# Patient Record
Sex: Female | Born: 1935 | Race: Black or African American | Hispanic: No | Marital: Married | Smoking: Never smoker
Health system: Southern US, Community
[De-identification: ages and names within clinical notes are randomized; demographics above are authoritative.]

## PROBLEM LIST (undated history)

## (undated) DIAGNOSIS — T8859XA Other complications of anesthesia, initial encounter: Secondary | ICD-10-CM

## (undated) DIAGNOSIS — O2230 Deep phlebothrombosis in pregnancy, unspecified trimester: Secondary | ICD-10-CM

## (undated) DIAGNOSIS — F419 Anxiety disorder, unspecified: Secondary | ICD-10-CM

## (undated) DIAGNOSIS — M109 Gout, unspecified: Secondary | ICD-10-CM

## (undated) DIAGNOSIS — K5909 Other constipation: Secondary | ICD-10-CM

## (undated) DIAGNOSIS — Z87442 Personal history of urinary calculi: Secondary | ICD-10-CM

## (undated) DIAGNOSIS — I219 Acute myocardial infarction, unspecified: Secondary | ICD-10-CM

## (undated) DIAGNOSIS — G2581 Restless legs syndrome: Secondary | ICD-10-CM

## (undated) DIAGNOSIS — R1310 Dysphagia, unspecified: Secondary | ICD-10-CM

## (undated) DIAGNOSIS — N183 Chronic kidney disease, stage 3 unspecified: Secondary | ICD-10-CM

## (undated) DIAGNOSIS — Z9889 Other specified postprocedural states: Secondary | ICD-10-CM

## (undated) DIAGNOSIS — T4145XA Adverse effect of unspecified anesthetic, initial encounter: Secondary | ICD-10-CM

## (undated) DIAGNOSIS — R42 Dizziness and giddiness: Secondary | ICD-10-CM

## (undated) DIAGNOSIS — M1990 Unspecified osteoarthritis, unspecified site: Secondary | ICD-10-CM

## (undated) DIAGNOSIS — R112 Nausea with vomiting, unspecified: Secondary | ICD-10-CM

## (undated) DIAGNOSIS — R569 Unspecified convulsions: Secondary | ICD-10-CM

## (undated) DIAGNOSIS — I723 Aneurysm of iliac artery: Secondary | ICD-10-CM

## (undated) DIAGNOSIS — E119 Type 2 diabetes mellitus without complications: Secondary | ICD-10-CM

## (undated) DIAGNOSIS — C189 Malignant neoplasm of colon, unspecified: Secondary | ICD-10-CM

## (undated) DIAGNOSIS — H409 Unspecified glaucoma: Secondary | ICD-10-CM

## (undated) DIAGNOSIS — Z8719 Personal history of other diseases of the digestive system: Secondary | ICD-10-CM

## (undated) DIAGNOSIS — K219 Gastro-esophageal reflux disease without esophagitis: Secondary | ICD-10-CM

## (undated) HISTORY — DX: Chronic kidney disease, stage 3 unspecified: N18.30

## (undated) HISTORY — DX: Aneurysm of iliac artery: I72.3

## (undated) HISTORY — DX: Deep phlebothrombosis in pregnancy, unspecified trimester: O22.30

## (undated) HISTORY — PX: VEIN SURGERY: SHX48

## (undated) HISTORY — PX: TOTAL HIP ARTHROPLASTY: SHX124

## (undated) HISTORY — DX: Unspecified glaucoma: H40.9

## (undated) HISTORY — DX: Malignant neoplasm of colon, unspecified: C18.9

## (undated) HISTORY — PX: TUBAL LIGATION: SHX77

## (undated) HISTORY — PX: CHOLECYSTECTOMY: SHX55

## (undated) HISTORY — DX: Acute myocardial infarction, unspecified: I21.9

---

## 1991-05-21 DIAGNOSIS — I219 Acute myocardial infarction, unspecified: Secondary | ICD-10-CM

## 1991-05-21 HISTORY — DX: Acute myocardial infarction, unspecified: I21.9

## 2001-05-20 HISTORY — PX: COLECTOMY: SHX59

## 2002-11-04 ENCOUNTER — Inpatient Hospital Stay (HOSPITAL_COMMUNITY): Admission: EM | Admit: 2002-11-04 | Discharge: 2002-11-09 | Payer: Self-pay | Admitting: Internal Medicine

## 2002-11-06 ENCOUNTER — Encounter: Payer: Self-pay | Admitting: Internal Medicine

## 2003-06-02 ENCOUNTER — Ambulatory Visit (HOSPITAL_COMMUNITY): Admission: RE | Admit: 2003-06-02 | Discharge: 2003-06-02 | Payer: Self-pay | Admitting: Internal Medicine

## 2005-01-09 ENCOUNTER — Ambulatory Visit: Payer: Self-pay | Admitting: Cardiology

## 2005-01-16 ENCOUNTER — Ambulatory Visit: Payer: Self-pay | Admitting: Cardiology

## 2005-09-03 ENCOUNTER — Ambulatory Visit: Payer: Self-pay | Admitting: Cardiology

## 2005-10-02 ENCOUNTER — Ambulatory Visit: Payer: Self-pay | Admitting: Internal Medicine

## 2005-10-02 ENCOUNTER — Inpatient Hospital Stay (HOSPITAL_COMMUNITY): Admission: AD | Admit: 2005-10-02 | Discharge: 2005-10-08 | Payer: Self-pay | Admitting: Internal Medicine

## 2005-10-15 ENCOUNTER — Observation Stay (HOSPITAL_COMMUNITY): Admission: EM | Admit: 2005-10-15 | Discharge: 2005-10-18 | Payer: Self-pay | Admitting: Emergency Medicine

## 2005-10-23 ENCOUNTER — Ambulatory Visit: Payer: Self-pay | Admitting: Internal Medicine

## 2005-11-25 ENCOUNTER — Ambulatory Visit: Payer: Self-pay | Admitting: Internal Medicine

## 2005-11-28 ENCOUNTER — Ambulatory Visit: Payer: Self-pay | Admitting: Internal Medicine

## 2005-11-28 ENCOUNTER — Ambulatory Visit (HOSPITAL_COMMUNITY): Admission: RE | Admit: 2005-11-28 | Discharge: 2005-11-28 | Payer: Self-pay | Admitting: Internal Medicine

## 2005-12-18 ENCOUNTER — Ambulatory Visit: Payer: Self-pay | Admitting: Internal Medicine

## 2006-03-04 ENCOUNTER — Ambulatory Visit: Payer: Self-pay | Admitting: Internal Medicine

## 2006-04-07 ENCOUNTER — Ambulatory Visit (HOSPITAL_COMMUNITY): Admission: RE | Admit: 2006-04-07 | Discharge: 2006-04-07 | Payer: Self-pay | Admitting: Internal Medicine

## 2006-07-21 ENCOUNTER — Ambulatory Visit: Payer: Self-pay | Admitting: Cardiology

## 2007-03-01 ENCOUNTER — Emergency Department (HOSPITAL_COMMUNITY): Admission: EM | Admit: 2007-03-01 | Discharge: 2007-03-01 | Payer: Self-pay | Admitting: Emergency Medicine

## 2007-04-10 ENCOUNTER — Ambulatory Visit: Payer: Self-pay | Admitting: Cardiology

## 2010-10-05 NOTE — Op Note (Signed)
NAME:  Traci Clark, Traci Clark                           ACCOUNT NO.:  [id number]   MEDICAL RECORD NO.:  [id number]                   PATIENT TYPE:  AMB   LOCATION:  DAY                                  FACILITY:  [facility]   PHYSICIAN:  R. Roetta Sessions, M.D.              DATE OF BIRTH:  [birth year]   DATE OF PROCEDURE:  06/02/2003  DATE OF DISCHARGE:                                 OPERATIVE REPORT   PROCEDURE:  Esophagogastroduodenoscopy with Elease Hashimoto dilation.   INDICATIONS FOR PROCEDURE:  The patient is a 77 year old lady with what she  describes as esophageal dysphagia to pills and solids, pointing to her  suprasternal notch area.  Her hemoglobin returned within normal limits at  12.8.  EGD is now being done to further evaluate her dysphagia symptoms.  This approach has been discussed with the patient at length.  The potential  risks, benefits, and alternatives have been reviewed.  Please see my  05/25/2003 consultation note.   PROCEDURE:  O2 saturation, blood pressure, pulses, and respirations were  monitored throughout the entire procedure.  Conscious sedation was with  Versed 3 mg IV, Demerol 50 mg IV in divided doses.  The instrument used was  the Olympus video chip adult gastroscope.   FINDINGS:  Examination of the tubular esophagus revealed no mucosal  abnormalities.  The EG junction was easily traversed.   Stomach:  The gastric cavity was empty and insufflated well with air.  Thorough examination of the gastric mucosa including retroflex view in the  proximal stomach and esophagogastric junction demonstrated no abnormalities.  The pylorus was patent and easily traversed.   Duodenum:  Examination of the bulb and second portion revealed no  abnormalities.   THERAPEUTIC/DIAGNOSTIC MANEUVERS PERFORMED:  A 56 French Maloney dilator was  passed to full insertion with ease.  A look back revealed a slight rent in  the mucosa through the UES.  Otherwise, no apparent complication related to  passage of the dilator.  The patient tolerated the procedure well and was  reactive in endoscopy.   IMPRESSION:  1. Normal mucosa of the esophagus.  No obvious ring or stricture, although     she may have had an occult upper esophageal sphincter web, status post     passage of a 56 [place] Maloney dilator, as described above.  2. Normal stomach, normal first and second portions of the duodenum.   RECOMMENDATIONS:  1. Continue Prilosec 20 mg orally daily.  2. Will ask this nice lady to come back to see [place] in one month to see how     she is doing.  If her dysphagia persists, she will need further     evaluation.      ___________________________________________  Jonathon Bellows, M.D.   RMR/MEDQ  D:  06/02/2003  T:  06/02/2003  Job:  [id number]   cc:   Selinda Flavin  [address]  [place]  [place] [address]  Fax: [phone]

## 2010-10-05 NOTE — Discharge Summary (Signed)
NAME:  Traci Clark, Traci Clark                          ACCOUNT NO.:  [id number]   MEDICAL RECORD NO.:  [id number]                   PATIENT TYPE:  INP   LOCATION:  4707                                 FACILITY:  [facility]   PHYSICIAN:  Maple Mirza, P.A.              DATE OF BIRTH:  [birth year]   DATE OF ADMISSION:  DATE OF DISCHARGE:  11/09/2002                           DISCHARGE SUMMARY - REFERRING   DISCHARGE DIAGNOSES:  1. Chest pain, known coronary artery disease, exertional.  Concurrent     diaphoresis, nausea and vomiting.  Finding of non-obstructive coronary     artery disease by left-heart catheterization June 18.  2. Abdominal pain, right epigastric to right upper quadrant, persisting,     etiology unclear.     A. Increased pancreatic enzymes on admission, now normal.     B. Status post remote cholecystectomy.  3. CT study, question of liver lesions, but MRI shows cysts there.  4. MRCP ducts are normal.  No mention of pancreas divisum.   SECONDARY DIAGNOSES:  1. Dyslipidemia.  2. History of deep venous thrombosis.  3. History of myocardial infarction in 1998, underwent catheterization, no     PCI.  4. Gastroesophageal reflux disease.  5. Osteoarthritis.  6. Cerebrovascular accident x3.  7. History of colon cancer, status post partial colectomy in September of     2003.  8. Status post cholecystectomy, bilateral tubal ligation.   PROCEDURES:  1. Left-heart catheterization, left ventriculogram and coronary angiography,     Dr. Rollene Rotunda, November 05, 2002.  The study shows the left main was     free of disease.  The left anterior descending coronary artery had a     proximal 30% stenosis, a mid point 30% stenosis and had a small second     diagonal.  The first diagonal was large and ran in the distribution of     the ramus intermediate.  It has an ostial 25% stenosis.  The third     diagonal was small and had an ostial 30% stenosis.  The circumflex is a     large  branching artery with a mid obtuse marginal and luminal     irregularities.  The right coronary artery was a dominant vessel with     diffuse luminal irregularities.  The left ventriculogram shows the     ejection fraction 65% with normal wall motion.  Conclusion was     nonobstructive coronary artery plaque.  2. In the setting of continued abdominal pain, computed tomogram of the     abdomen and pelvis.  The study showed there are a few small hepatic     lesions too small to accurately characterize.  Given the patient's     history of colon cancer, metastatic disease a possibility.  The spleen is     normal size.  The pancreas demonstrates no significant findings.  Common  bile duct measures 8 mm in the region of the pancreas which is probably     normal given the fact the patient has had a cholecystectomy and the     patient's age.  The adrenal gland is normal.  Two lesions in the right     kidney are probably hemorrhagic.  There are two cysts in the left kidney     also.  They are simple-appearing cysts.  Ultrasound might be helpful to     demonstrate these lesions are benign.  No mesenteric or retroperitoneal     masses or adenopathy.  No colonic wall thickening or colonic lesions.     The patient may have had a partial right colectomy.  No significant bony     lesions.  A CT of the pelvis shows multiple phlebolithic calcifications     in the ovarian veins.  The uterus and ovaries are unremarkable.  No     pelvic adenopathy, pelvic masses or free pelvic fluid.  3. MRI of the abdomen.  The actual report for the MRI is unavailable, but     chart recording notes that the liver lesions are cystic.  Also done was a     MRCP showing that the ducts are normal without mention of pancreas     divisum.   DISCHARGE DISPOSITION:  The patient is ready for discharge on June 22.  She  still has vague abdominal pain in the right upper quadrant, right epigastric  area.  It does not come or go.  It  is not linked to eating at all, and not  linked to exertion.  It is not positional.  It does not change when she  moves.  It is poorly relieved with oxycodone.  She does not have any  concomitant nausea or vomiting.  She does have some constipation.  This is  being addressed at the time of discharge.   DISCHARGE MEDICATIONS:  1. Lotrimin cream applied twice a day topically.  2. Protonix 40 mg twice daily.  3. Antivert 20 mg daily.  4. Enteric-coated baby aspirin 81 mg daily.  5. Multivitamin daily.  6. Ativan 0.25 mg two to three times daily as needed.  7. Ultram 13 mg one to two tablets every six hours as needed for pain.   DISCHARGE ACTIVITIES:  She may walk daily to keep up her strength.   DISCHARGE DIAGNOSES:  1. Low sodium, low cholesterol diet.  2. If she has any problems at her catheterization site in the right groin,     she is to call [phone].  These would include problems with swelling,     increased pain or drainage.  3. She will have a followup in [facility] office on Thursday, July 1, at     1:15 in the afternoon.  4. She is asked to call Dr. Dimas Aguas to arrange appointment for followup.   BRIEF HISTORY:  The patient is a 77 year old female with a history of  myocardial infarction in 1989.  She began having substernal chest pain,  heaviness, decrease in activity.  She took TUM's and aspirin without relief.  She did not use nitroglycerin.  This pain has continued for six days.  She  does have diaphoresis, nausea and vomiting, but she is not short of breath.  She went to the emergency room since her symptoms were not improving.  They  were 6/10 at the worst.  Sublingual nitroglycerin in the emergency room  helped.  She was pain-free,  but she still had nausea.  Sometimes she has  these symptoms secondary to increased exertion.  They come in increasing  frequency and intensity recently.  She had a recent history of fatigue.  She  gets chest pain when vacuuming.   The  patient was admitted to [facility] through the emergency room.  Cardiac enzymes were negative for myocardial infarction.  However, she did  have a left heart catheterization performed June 18 which showed  nonobstructive coronary artery plaques and an ejection fraction of 65%.  A  GI consultation was obtained due to increased amylase and lipase on  admission.  She was seen by GI service for possible pancreatitis in a  setting of elevated lipase and amylase.  She underwent CT scan of the  abdomen and pelvis because of history of colon cancer and possibility of  metastatic involvement in the pancreas, bowel or liver.  A CT of the abdomen  and pelvis were as described above. A followup MRI was obtained,  and it  showed the liver lesions were cysts, and the renal lesions were most  certainly also cystic.  She also had an MRCP to image the common bile duct  which showed that they were normal.  At the time of discharge, her chest  pain has resolved.  However, she still has vague abdominal pain at the right  upper quadrant and right epigastric area.  She goes home with the  medications and with followup as suggested.                                               Maple Mirza, P.A.    GM/MEDQ  D:  11/09/2002  T:  11/09/2002  Job:  [id number]   cc:   Dimas Aguas, Dr.  Alben Spittle, Dr.  Roderic Scarce, [place]   [facility]  Roderic Scarce, N.D.    cc:   Dimas Aguas, Dr.  Alben Spittle, Dr.  Roderic Scarce, [facility].   [facility]  [place], [place].D.

## 2010-10-05 NOTE — Op Note (Signed)
NAMEGENEVIVE, PRINTUP                ACCOUNT NO.:  [id number]   MEDICAL RECORD NO.:  [id number]          PATIENT TYPE:  INP   LOCATION:  A326                          FACILITY:  [facility]   PHYSICIAN:  Lionel December, M.D.    DATE OF BIRTH:  31-Jul-1930   DATE OF PROCEDURE:  10/03/2005  DATE OF DISCHARGE:                                 OPERATIVE REPORT   PROCEDURE:  Esophagogastroduodenoscopy.   ENDOSCOPIST:  Lionel December, M.D.   INDICATIONS:  Traci Clark is a 77 year old African American female who has over  a 3-week history of generalized abdominal pain associated with nausea and  vomiting.  She had essentially negative abdominopelvic CT.  This morning she  had CT angiogram of the abdomen which was unremarkable.  She also has mildly  elevated transaminases, which were initially noted yesterday, but there were  normal on admission, which was at [facility] on the 13th of this month.  This  afternoon, she is undergoing diagnostic EGD to make sure she does not have  peptic ulcer disease or pyloric stenosis that might explain some or all of  all of her symptoms.  Procedure risks were reviewed the patient; informed  consent was obtained.   MEDICATIONS FOR CONSCIOUS SEDATION:  Benzocaine spray for oropharyngeal  topical anesthesia, Demerol 25 mg IV, Versed 3 mg IV.   FINDINGS:  Procedure performed in endoscopy suite.  The patient's vital  signs and O2 SATs were monitored during the procedure and remained stable.  The patient was placed in the left lateral decubitus position and Olympus  videoscope was passed via oropharynx without any difficulty into esophagus.   ESOPHAGUS:  Mucosa of the esophagus normal.  GE junction was unremarkable,  located at 37 cm from the incisors.   STOMACH:  It was empty and distended very well with insufflation.  Folds in  the proximal stomach were normal.  Examination of the mucosa at body,  antrum, pyloric channel as well as angularis, fundus and cardia was normal.   DUODENUM:  Bulbar mucosa was normal.  Scope was passed into the second and  third part of the duodenum, where mucosa and folds were normal.  Endoscope  was withdrawn.  The patient tolerated the procedure well.   FINAL DIAGNOSIS:  Normal esophagogastroduodenoscopy.   Etiology for her symptom complex remains unclear.   PLAN:  1.  Trial of dicyclomine and low-dose lorazepam 3 times a day.  2.  MRCP in a.m., since her transaminases are still elevated.  3.  I will also consider empiric antidepressant therapy.  If MR is normal,      we may change our plans and also do a      colonoscopy, which will be due in October or September of this year.  4.  While studies are negative, a trial with antidepressant would be in      order.  5.  Finally, I would also check a fasting cortisol level.      Lionel December, M.D.  Electronically Signed     NR/MEDQ  D:  10/03/2005  T:  10/04/2005  Job:  [id number]  cc:   Rory Percy  Fax: [phone]

## 2010-10-05 NOTE — H&P (Signed)
Traci Clark, Traci Clark                ACCOUNT NO.:  [id number]   MEDICAL RECORD NO.:  [id number]          PATIENT TYPE:  INP   LOCATION:  A311                          FACILITY:  [facility]   PHYSICIAN:  Lonia Blood, M.D.       DATE OF BIRTH:  [birth year]   DATE OF ADMISSION:  10/15/2005  DATE OF DISCHARGE:  LH                                HISTORY & PHYSICAL   PRIMARY CARE PHYSICIAN:  Selinda Flavin, M.D. in [place], [place].   CHIEF COMPLAINT:  Nausea, vomiting, abdominal pain and cannot keep anything  down.   HISTORY OF PRESENT ILLNESS:  Traci Clark is a 77 year old African-American  woman who was admitted from Oct 03, 2005 until Oct 08, 2005 at [facility] with complaints of nausea, vomiting and abdominal pain.  She has an  extensive evaluation including CT angiogram of the abdomen, MRCP, EGD and  upper gastrointestinal series with small bowel follow through, which were  all negative.  The patient was discharged home with a presumed diagnosis of  gastroparesis, and she was instructed to advance her diet as tolerated.  Traci Clark reports that she has lost completely her appetite, and that she  was just not able to eat anything at all at home, and every time she would  try to eat food or drink water, she would vomit promptly and started having  abdominal pain.  For this reason, she presented back to the emergency room  today to be evaluated and admitted to the hospital.   PAST MEDICAL HISTORY:  1.  Hypertension.  2.  Gastroesophageal reflux disease with multiple esophageal dilatations.  3.  Nonobstructive coronary artery disease.  4.  Colon cancer, status post partial resection of her colon in 2003.  5.  Anxiety.  6.  Status post cholecystectomy.  7.  Status post bilateral tubal ligation.  8.  History of biliary pancreatitis in 1998.   MEDICATIONS AT HOME:  1.  Nortriptyline 10 mg at bedtime.  2.  Ultracet 50 mg daily.  3.  Meclizine 25 mg as needed for dizziness.  4.   Sertraline 25 mg daily.  5.  Diazepam 2.5 mg daily.  6.  Protonix 40 mg daily.  7.  Hydrochlorothiazide 25 mg daily.  8.  Reglan 10 mg before each meal.  9.  Multivitamin daily.   ALLERGIES:  1.  DARVOCET.  2.  VICODIN.   SOCIAL HISTORY:  The patient is retired.  She lives in [place], [place] her husband.  She is married.  She has 7 children; 5 of them are  diabetic.  She has never smoked cigarettes, and does not drink alcohol.   FAMILY HISTORY:  Both parents are deceased.  The patient has 7 siblings; 2  of them are living.  She has a positive family history of colon cancer, and  head and neck cancer.   REVIEW OF SYSTEMS:  On admission, the patient denies chest pain, denies  dyspnea, denies dysuria.  She reports decreased urinary output.  All other  systems are negative.   PHYSICAL EXAMINATION:  VITAL SIGNS:  On admission, temperature of 98.0,  blood pressure of 141/70, pulse 73, respirations 20, saturation of 98% on  room air.  GENERAL APPEARANCE:  The patient appears in no apparent distress.  She is  lying on a stretcher.  She seems to be worried out her outcome.  She is  oriented to place, person and time, and able to provide a history.  HEENT:  Eyes - pupils equal, round and reactive to light and accommodation.  Extraocular movement is intact.  Sclerae are anicteric.  Conjunctivae are  pink.  Throat is clear.  Mouth is without ulcerations.  NECK:  Supple without JVD.  No carotid bruits.  CHEST:  Clear to auscultation bilaterally without wheezes, rhonchi or  crackles.  HEART:  Regular rate and rhythm without murmurs, rubs, or gallops.  ABDOMEN:  Obese and soft.  There is some mild diffuse tenderness to deep  palpation.  There is no rebound, no guarding and no palpable masses.  Bowel  sounds are decreased.  EXTREMITIES:  No edema.  SKIN:  Warm and dry without any suspicious-looking rashes.  NEUROLOGIC:  Cranial nerves III-XII are intact.  Strength is 5/5 in all  4  extremities.  Sensation is intact.   LABORATORY DATA:  At the time of admission, white blood cell count of 4.6,  hemoglobin 13.5, platelet count 259.  Sodium 140, potassium 3.8, chloride  104, bicarbonate 30, BUN 8, creatinine 1.4, glucose 112.  Calcium is 10,  albumin 3.7, AST is 27, ALT 19, alkaline phosphatase 111.  Total bilirubin  of 0.8, and lipase is 34.  A flat plate abdominal x-ray is within normal  limits.   ASSESSMENT AND PLAN:  1.  Nausea, vomiting and abdominal pain.  In reviewing this patient's      history and physical findings and previous records, it does appear that      this patient has a functional abdominal problem.  At this point in time,      I will also rule out celiac disease, rule out porphyria, and rule out      heavy metal poisoning.  We will treat this patient symptomatically with      intravenous fluids, Reglan, Zofran and analgesia.  Will try to advance      her diet as she tolerates.  We will also keep this patient on a proton      pump inhibitor.  2.  Anxiety disorder.  We will try to use Xanax around the clock, which also      has a component of nausea suppression.  3.  During the previous hospitalization, the patient was found to have a      relative low cortisol level, so I will      empirically treat this patient with steroids intravenous to see if this      will help her symptoms.  4.  Dehydration.  The patient will be started on intravenous fluids, and her      volume status will be closely monitored.      Lonia Blood, M.D.  Electronically Signed     SL/MEDQ  D:  10/15/2005  T:  10/15/2005  Job:  [id number]   cc:   Selinda Flavin  Fax: [phone]   Lionel December, M.D.  [address]  [place]  [place] [address]

## 2010-10-05 NOTE — Discharge Summary (Signed)
   NAME:  Traci Clark, Traci Clark                          ACCOUNT NO.:  [id number]   MEDICAL RECORD NO.:  [id number]                   PATIENT TYPE:  INP   LOCATION:  4707                                 FACILITY:  [facility]   PHYSICIAN:  Pricilla Riffle, M.D.                 DATE OF BIRTH:  1930-06-24   DATE OF ADMISSION:  11/04/2002  DATE OF DISCHARGE:  11/09/2002                           DISCHARGE SUMMARY - REFERRING   LABORATORY DATA:  On admission, November 04, 2002, amylase was 328 and lipase  342.  By November 06, 2002, amylase was 166 and lipase 20 and by November 07, 2002,  amylase was 102 and lipase 21.  Admission cardiac enzymes, November 04, 2002,  were troponin less than 0.01, CK 64, CK-MB 0.5.  CBC on November 07, 2002 showed  hemoglobin 11.9, hematocrit 35.9, platelets 174,000, white cells 4.6.  Serum  electrolytes on November 06, 2002 showed sodium 140, potassium 3.6, chloride  107, carbonate 29, BUN 7, creatinine 1.0, and glucose 90.  Lipid profile  shows that her cholesterol on November 05, 2002 was 181, triglycerides 86, HDL  cholesterol 43, LDL cholesterol 121.     Maple Mirza, P.A.                    Pricilla Riffle, M.D.    GM/MEDQ  D:  11/09/2002  T:  11/09/2002  Job:  [id number]

## 2010-10-05 NOTE — Cardiovascular Report (Signed)
   NAME:  Traci Clark, Traci Clark                          ACCOUNT NO.:  [id number]   MEDICAL RECORD NO.:  [id number]                   PATIENT TYPE:  INP   LOCATION:  4707                                 FACILITY:  [facility]   PHYSICIAN:  Rollene Rotunda, M.D.                DATE OF BIRTH:  December 13, 1930   DATE OF PROCEDURE:  11/05/2002  DATE OF DISCHARGE:                              CARDIAC CATHETERIZATION   PRIMARY CARE PHYSICIAN:  Selinda Flavin, M.D.   PROCEDURES PERFORMED:  1. Left heart catheterization.  2. Coronary arteriography.   CARDIOLOGIST:  Rollene Rotunda, M.D.   INDICATIONS:  Evaluate patient with chest pain suggestive of unstable  angina.   PROCEDURAL NOTE:  Left heart catheterization was performed via the right  femoral artery.  The artery was cannulated using an anterior wall puncture.  Number six French arterial sheath was inserted via the modified Seldinger  technique.  Preformed Judkins and pigtail catheter were utilized.   The patient tolerated the procedure well and left the lab in stable  condition.   RESULTS:   HEMODYNAMIC DATA:  LV 177/13.  AO 177/80.   CORONARIES:  The left main was normal.   The LAD was a large wrap around vessel.  There was proximal 30% stenosis and  mid 30% stenosis at a small second diagonal.  The first diagonal was very  large and ran in the distribution of a ramus intermediate vessel.  It had  ostial 25% stenosis.  The second diagonal was small.  A third diagonal was  small and had ostial 30% stenosis.   The circumflex was essentially a very large branching mid obtuse marginal  with luminal irregularities.   The right coronary artery was a dominant vessel with diffuse luminal  irregularities.   LEFT VENTRICULOGRAM:  The left ventriculogram was obtained in the RAO  projection.  The EF was 65% with normal wall motion.   CONCLUSION:  Nonobstructive coronary artery plaque.    PLAN:  1. The patient will have primary risk reduction.  2.  No further cardiovascular testing is suggested.                                                 Rollene Rotunda, M.D.    JH/MEDQ  D:  11/05/2002  T:  11/07/2002  Job:  [id number]  Selinda Flavin  [address]  [place]  [place] [address]  Fax: [phone]   cc:   Selinda Flavin  [address]  [place]  [place] [address]  Fax: [phone]

## 2010-10-05 NOTE — Consult Note (Signed)
Traci Clark, Traci Clark                            ACCOUNT NO.:  [id number]   MEDICAL RECORD NO.:  [id number]                  PATIENT TYPE:   LOCATION:                                       FACILITY:   PHYSICIAN:  R. Roetta Sessions, M.D.              DATE OF BIRTH:  10-12-1930   DATE OF CONSULTATION:  05/25/2003  DATE OF DISCHARGE:                                   CONSULTATION   REQUESTING PHYSICIAN:  Dr. Dimas Aguas.   CHIEF COMPLAINT:  Solid food dysphagia.   HISTORY OF PRESENT ILLNESS:  Traci Clark is a 77 year old African-  American female who presents to our office with recent history of solid food  dysphagia.  She states that she is having difficulty swallowing her pills.  Once she does get them down, several seconds later she is having  regurgitation.  She denies any dysphagia with liquids.  She is also  complaining of odynophagia and notes pain in her retrosternal area.  She  denies any nausea or vomiting.  She does have history of chronic GERD and  takes Prilosec 20 mg daily.  She feels her heartburn and indigestion are  well controlled on the Prilosec.  She has been on PPI therapy for  approximately 10 years now.  She did have episode of hematemesis when she  was recently hospitalized in December 2004 for tonsillitis and pharyngitis;  however, she has not had any recently.  She denies any lower abdominal pain.  She denies any melena but she does report occasional dark stools.  She does  have a history of colon carcinoma and is status post resection in September  2003 by Dr. Cleotis Nipper.  She is being followed with colonoscopies by Dr.  Verne Grain office.  The last colonoscopy was September 2004 which  reportedly per the patient was normal.   PAST MEDICAL HISTORY:  1. Vertigo.  2. GERD.  3. Hypertension.  4. Arthritis.  5. Anxiety.  6. DVT.  7. Recent hospitalization December 2004 for tonsillitis and pharyngitis.  8. Last colonoscopy September 2004 by Dr. Cleotis Nipper  reportedly normal per     the patient.  9. Hiatal hernia.   PAST SURGICAL HISTORY:  1. Colon carcinoma status post resection September 2003.  Is being followed     by Dr. Verne Grain office.  2. Cholecystectomy 1998 secondary to cholelithiasis.  3. Tubal ligation.   CURRENT MEDICATIONS:  1. Meclizine 25 mg daily.  2. Prilosec 20 mg daily.  3. Multivitamin daily.  4. Atenolol 50 mg one-half tablet q.h.s.  5. Calcium 600 mg b.i.d.  6. Aspirin 81 mg daily.  7. Tylenol ES q.i.d.  8. Xanax 0.25 mg t.i.d.   ALLERGIES:  DARVOCET and HYDROCODONE.   FAMILY HISTORY:  No known family history of colorectal carcinoma.  Mother is  deceased secondary to tuberculosis.  Father had history of heart disease.  She has two deceased sisters as well  as three deceased brothers and one  brother alive in fair health.   SOCIAL HISTORY:  Traci Clark has been married for 12 years.  She has seven  children in relatively good health.  She is currently retired.  She denies  any tobacco, alcohol, or drug use.   REVIEW OF SYSTEMS:  CONSTITUTIONAL:  Weigh is stable although she does  report she may have lost a few pounds over the last couple of months  secondary to decreased appetite.  She denies any fever or chills.  She is  complaining of fatigue.  EXTREMITIES:  Complains of right axillary swelling  and pain.  Denies any lower extremity edema.  SKIN:  Denies any rash or  jaundice.  GI:  See HPI.   PHYSICAL EXAMINATION:  VITAL SIGNS:  Weight 181.25 pounds, height 66 inches.  Temperature 97.5, blood pressure 140/80, pulse 64.  GENERAL:  Traci Clark is a 77 year old African-American female who is  well-developed, well-nourished, and in no apparent distress today.  HEENT:  Sclerae clear, nonicteric; conjunctivae pink.  Neck supple without  any mass or thyromegaly.  She does have some anterior cervical tenderness  and slight swelling on palpation; however, no discrete masses.  Oropharynx  pink and moist  without any lesions.  CHEST:  Heart regular rate and rhythm without murmurs, clicks, rubs, or  gallops.  Lungs clear to auscultation bilaterally.  ABDOMEN:  Positive bowel sounds x4.  She does have a large vertical scar to  upper abdomen secondary to colon resection.  Soft, nontender, nondistended,  with no palpable mass or organomegaly.  EXTREMITIES:  Show 2+ pedal pulses bilaterally, no edema.  SKIN:  Brown, warm, and dry without any rash or jaundice.   ASSESSMENT:  1. Traci Clark is a 77 year old African-American female with recent     history of what appears to be esophageal dysphagia.  Would recommend     further evaluation with EGD and possible esophageal dilatation if ring or     stricture is present.  2. Fatigue.  Will check H&H day of procedure.  3. History of colon carcinoma.  This is being followed by Dr. Cleotis Nipper.  4. Right axillary swelling, appears to be possible sebaceous cyst.  Will set     up appointment for her to see Dr. Dimas Aguas for this as she may need I&D and     antibiotic treatments.   RECOMMENDATIONS:  1. Will schedule appointment with Dr. Dimas Aguas for sebaceous cyst.  2. I have discussed EGD with possible esophageal dilatation including risks     and benefits with Traci Clark which include but are not limited to     bleeding, perforation, and infection.  She agrees with this plan.     Consent will be obtained.  She is asked to hold her aspirin for three     days prior to the procedure.  Will check CBC the day of procedure.  3. She is to continue her Prilosec 20 mg daily for now.  4. Further recommendations pending procedure.   We would like to thank Dr. Dimas Aguas in [place] for this kind  referral.     ________________________________________  ___________________________________________  Nicholas Lose, N.P.                  Jonathon Bellows, M.D.   KC/MEDQ  D:  05/25/2003  T:  05/25/2003  Job:  [id number]  cc:   Selinda Flavin  [address] [place], [place]  [place]  [place] [address]  Fax: [phone]   R. Roetta Sessions, M.D.  [address]  [place]  [place] [address]  Fax: [phone]

## 2010-10-05 NOTE — Op Note (Signed)
NAME:  Traci Clark, Traci Clark                 ACCOUNT NO.:  [id number]   MEDICAL RECORD NO.:  [id number]          PATIENT TYPE:  AMB   LOCATION:  DAY                           FACILITY:  [facility]   PHYSICIAN:  R. Roetta Sessions, M.D. DATE OF BIRTH:  29-Jun-1930   DATE OF PROCEDURE:  11/28/2005  DATE OF DISCHARGE:                                 OPERATIVE REPORT   PROCEDURE PERFORMED:  Colonoscopy.   INDICATIONS FOR PROCEDURE:  The patient is a  78 year old lady with a three-  month history of  intermittent nausea, vomiting, anorexia, weight loss,  history of colon cancer, status post right hemicolectomy previously.  She is  here for surveillance exam and to further evaluate her symptoms.  Urinalysis  was negative through our office recently.  This approach has been discussed  with the patient at length.  Potential risks, benefits and alternatives have  been reviewed, questions have been answered, she is agreeable.  Please see  documentation in the medical records.   PROCEDURE NOTE:  Oxygen saturations, blood pressure, pulse and respirations  were monitored throughout the entire procedure.  Conscious sedation Versed  3 mg IV, Demerol 75 mg IV in divided doses.   INSTRUMENT USED:  Olympus video chip system.   FINDINGS:  Digital rectal exam revealed no abnormalities.   ENDOSCOPIC FINDINGS:  Prep was marginal.  She had some trouble with nausea  and vomiting at time of the prep last evening but procedure was doable.   Rectum:  Examination of the rectal mucosa including retroflex view of the  anal verge revealed no abnormalities.  Colon:  Colonic mucosa was surveyed from the rectosigmoid junction to the  left, transverse and right colon to the area of the anastomosis with the  small bowel.  The small bowel was intubated a good 10 to 15 cm. From this  level, the scope was slowly withdrawn.  All previously mentioned mucosal  surfaces were again seen.  There was no evidence of polyp or recurrent  neoplasm.  Surgical anastomosis appeared normal and was widely patent.  There was thick viscous liquid stool throughout the colon which was dealt  with copious washing and suctioning.  There was good visualization of the  residual colonic mucosa ultimately.  The patient tolerated the procedure  well, was reacted in endoscopy.   IMPRESSION:  Normal rectum, normal residual colonic mucosa.  Patent/normal-  appearing anastomosis with small bowel.   This lady has undergone exhaustive evaluation without a cause for nausea and  vomiting being elucidated.  She was started on Pamelor and Reglan  empirically during her last hospitalization.  It is possible there may be a  major functional component to her symptoms.   RECOMMENDATIONS:  Will plan to see this lady back in a short interval in the  office i.e. in two weeks.  Would not rule out possibility of tertiary  referral to get further perspective and insight into her GI symptoms.  Further recommendations to follow.      Jonathon Bellows, M.D.  Electronically Signed     RMR/MEDQ  D:  11/28/2005  T:  11/28/2005  Job:  [id number]   cc:   Selinda Flavin  Fax: [phone]

## 2010-10-05 NOTE — Discharge Summary (Signed)
NAME:  Traci Clark, Traci Clark                          ACCOUNT NO.:  [id number]   MEDICAL RECORD NO.:  [id number]                   PATIENT TYPE:  INP   LOCATION:  4707                                 FACILITY:  [facility]   PHYSICIAN:  Pricilla Riffle, M.D.                 DATE OF BIRTH:  11-May-1931   DATE OF ADMISSION:  11/04/2002  DATE OF DISCHARGE:  11/05/2002                           DISCHARGE SUMMARY - REFERRING   PROCEDURES:  1. Cardiac catheterization.  2. Coronary arteriogram.  3. Left ventriculogram.   HOSPITAL COURSE:  Mrs. Durocher is a 77 year old female with a history of a MI  per the patient in 1989, for which she went to [facility] and was catheterized but no PCI.  Those records are not available  at this time.  She began having substernal chest pain.  It was described as  a heaviness.  TUMS and aspirin were no help.  Decreasing activity was no  help, and she did not try nitroglycerin.  The pain was basically continuous  x6 days and associated with diaphoresis, nausea and vomiting.  She went to  the ER because her symptoms were not improving and received sublingual  nitroglycerin x3 in the ER, which relieved her pain.  She was transferred to  [facility] for further evaluation and treatment.   HOSPITAL COURSE:  Her enzymes were negative for MI, and she had a cardiac  catheterization performed on November 05, 2002.  The cardiac catheterization  showed a normal left main and a LAD that was a large vessel and had a mid  30% lesion and a small diagonal.  There was a proximal 30% stenosis.  There  were two other small diagonals, and the third diagonal had an ostial 30%  stenosis.  The first diagonal was large and had an ostial 25% stenosis.  The  circumflex was essentially a large branching obtuse marginal with luminal  irregularities.  The RCA was dominant with diffuse luminal irregularities.  Her EF was 65% with normal LV function.  The films were evaluated  by Dr.  Antoine Poche, who felt she had nonobstructive coronary plaque and that she  needed primarily risk factor reduction.  There was no obvious source of her  pain which has now resolved.   Upon review of her laboratories, it was noted that her amylase and lipase  were significantly elevated with amylase 328 and lipase 342.  She is status  post cholecystectomy per patient report.  No GI consult will be obtained.  Dictation summary is to be completed once the GI input has been obtained.   LABORATORY DATA:  Hemoglobin 11, hematocrit 33.4, WBC 5.5, platelets 170.  Sodium 144, potassium 3.5, chloride 102, CO2 32, BUN 7, creatinine 0.9,  glucose 121.  Amylase 328, lipase 342, total protein 6.5, albumin 3.6,  direct bilirubin less than 0.1, indirect bilirubin not calculated, calcium  9.1.  Lipid profile is pending at the time of discharge.   DISCHARGE CONDITION:  Stable.   DISCHARGE DIAGNOSES:  1. Chest pain, no critical coronary artery disease by catheterization this     admission.  2. Reported history of myocardial infarction at [facility] in 1989, without percutaneous coronary intervention.  3. Reported history of hyperlipidemia, with no recent check.  4. Family history of coronary artery disease.  5. Hypertension.  6. History of colon cancer, status post partial colectomy in 2003.  7. History of deep vein thrombosis.  8. History of cerebrovascular accident.  9. History of osteoarthritis.  10.      History of gastroesophageal reflux disease symptoms.  11.      Status post cholecystectomy and bilateral tubal ligation.  12.      History of seasonal allergies.  13.      History of burning with urination and a negative urinalysis.     Neill Loft, P.A.-C  [facility]           Pricilla Riffle, M.D.    RRG/MEDQ  D:  11/05/2002  T:  11/06/2002  Job:  [id number]

## 2010-10-05 NOTE — H&P (Signed)
[name], [name]                ACCOUNT NO.:  [id number]   MEDICAL RECORD NO.:  [id number]          PATIENT TYPE:  INP   LOCATION:  A326                          FACILITY:  [facility]   PHYSICIAN:  Lionel December, M.D.    DATE OF BIRTH:  1931/01/04   DATE OF ADMISSION:  10/02/2005  DATE OF DISCHARGE:  LH                                HISTORY & PHYSICAL   REASON FOR ADMISSION/TRANSFER (FROM [facility]):  Unexplained generalized abdominal  pain with nausea and vomiting.   HISTORY OF PRESENT ILLNESS:  Traci Clark is a 77 year old African-American female  whose present illness began about 3 weeks ago with burning generalized  abdominal pain.  She felt her abdomen was on fire.  In association with this  pain, she developed nausea as if her abdomen was on fire.  She also noted  loss of appetite. The patient anorexia.  She could not tell if pain was  worse in any quadrant of her abdomen.  She did not experience change in her  bowel habits, melena or rectal bleeding.  Her pain got worse and over a week  ago, she developed nausea associated with vomiting.  Finally she went to the  emergency room at [facility] on Sep 29, 2004 when pain became more intense and  unbearable.  She was evaluated in the emergency room and had abdominopelvic  CT along with acute abdominal series.  Acute abdominal series was  unremarkable except for numerous phleboliths and vascular calcifications.  Abdominopelvic CT revealed bilateral renal cysts. One of these could not be  characterized but was felt to be stable since previous study of 2006.  There  was a single non-obstructing left renal stone.  There was hepatic cyst in  the right lobe and degenerative spondylosis.  There was non-aneurysmal  tortuosity and ectasia to abdominal aorta but no definite aneurysm was seen.  Lab studies revealed essentially normal CBC with a WBC of 7.8, H&H of 14.2  and 43.5, platelet count was 225 K. DIF was unremarkable.  Chemistry panel  revealed calcium  of 10.4, albumin was 3.7, bili was 1.2, AP 102, AST was 28,  ALT 21.  Sodium was 146, potassium 3.7, chloride 108, CO2 was 30.  Amylase  was 118, lipase was 33. BUN was 15 and creatinine was 2, glucose was 111.  The patient was hospitalized for further management.  Apparently she also  gave a history of lightheadedness and dizziness.  According to Dr. Dian Situ  admission notes, she has had vague abdominal pain off and on since her right  hemicolectomy which was in September 2003 reviewed under past medical  history, but she has never had pain like this.  The patient was treated with  IV fluids, PPI and analgesia.  Her symptoms persisted.  She has not been  able to keep her food down.  She had lab studies repeated this morning. Her  WBC was 5.9.  Her creatinine was 1.3, her bili was 0.98, AP 102, AST however  was 70, ALT was 67.  Serum potassium of 3.2.  Her amylase was 259, calcium  was  9.1.  She also had urinalysis which was unremarkable.  Dr. Selinda Flavin  who is the patient's primary care physician requested that patient be  transferred to our GI service since we had been involved in her care  previously.   Once again the patient states pain has not changed since its onset 3 weeks  ago and she points to all of her abdomen as the location of pain.  She does  not pinpoint any area where pain is more intense.  She has not had any fever  until early this morning when her temperature was up to about 101.  She did  not have chills or diaphoreses.  She complains of headache that she has had  for the last 2 or 3 days.  Yesterday she had a weak spell when she was  trying get from bed to the bedside commode but she did not pass out.  She  has lost over 10 pounds.   At home she has been on meclizine 25 mg q. 6 p.r.n., Protonix 40 mg q.d.,  HCTZ 25 mg q.d., Valium 2.5 mg q.a.m., Tagamet 400 mg q.h.s.  While in the  hospital, she has been on her usual meds as well as morphine sulfate 3 mg IV  q. 2  p.r.n. pain.   PAST MEDICAL HISTORY:  Chronic GERD. She has undergone multiple esophageal  dilations.  The last one was by Dr. Jena Gauss in January 2005 and her esophagus  was dilated to 56-[facility] dilator although no obvious ring or  stricture was noted, but she had a small mucosal tear at UES.  Her dysphagia  improved following this intervention.  She had a normal examination of  stomach, first and second part of the duodenum.   She has history of cecal carcinoma. She had a right hemicolectomy in  September 2004 by Dr. Marcell Anger. She had a surveillance colonoscopy a year  later which was unremarkable.   She presented with biliary pancreatitis in February 1998 at which time she  was seen by me at [facility].  She did not require ERCP. She went on to have a  laparoscopic cholecystectomy.   Her chart mentions hypertension but she states she takes fluid medicine  ear;u edema.  She has generalized osteoarthrosis, anxiety disorder.   She had a tubal ligation several years ago and about 20 years ago she had  clots removed from back of both legs.  She also has history of hearing loss  which she was seen by Dr. Truman Hayward of [place], [place].   ALLERGIES:  HYDROCODONE, DARVOCET N 100 and LIPITOR all causing her to be  nauseated and sick.   FAMILY HISTORY:  She had 7 siblings and only 2 are living.  Two brothers  died of colon carcinoma, one at age 41, another one at 11. Another brother  who is living has been treated for prostate carcinoma and remains in  remission.  One sister was diagnosed with head and neck carcinoma in her 53s  but died at 4 of unrelated causes.   There is no history of colon carcinoma in her parents or aunts and uncles.   SOCIAL HISTORY:  She is married.  She has 7 children, 5 of whom are diabetic  and 3 are on insulin.  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  She has never smoked cigarettes and does not drink  alcohol.    PHYSICAL EXAM:  GENERAL:  Pleasant, well-developed,  well-nourished, African-  American female who does not appear to be in any distress.  VITAL SIGNS:  Admission weight 172.7 pounds.  She is 66 inches tall.  Pulse  is 57 per minute and regular, blood pressure 110/66, respiratory rate is 18  and temperature is 97.8.  HEENT:  Conjunctivae is pink.  Sclerae is nonicteric.  Oropharyngeal mucosa  is normal.  She has partial upper and lower dental plate.  No neck masses or  thyromegaly noted.  NECK:  Supple.  CARDIAC EXAM:  Regular rhythm.  Normal S1-S2.  No murmur or gallop noted.  LUNGS:  Clear to auscultation.  ABDOMEN:  Her abdomen is symmetrical, bowel sounds are normal. On palpation,  it is very soft with mild generalized tenderness.  There is no guarding or  rebound.  No organomegaly or masses.  RECTAL:  Rectal examination reveals a scant amount of stool in the vault  which was guaiac negative.  EXTREMITIES:  No peripheral edema or clubbing noted.  BREASTS:  Breast examination is normal except she has mild tenderness along  upper inner quadrant of the right breast.   LABORATORY DATA:  Labs from today as above.  She also had a CEA of 0.4.   Her BNP was 52.   ASSESSMENT:  Traci Clark is a 77 year old Afro-American female with a history of  colon carcinoma with right hemicolectomy in September 2003 whose colonoscopy  later was normal who presents with 3-week history of generalized burning  pain associated with nausea, vomiting, anorexia and weight loss.  On  admission, she had a normal CBC, mildly elevated serum calcium with elevated  creatinine and minimally elevated alkaline phosphatase with normal  transaminases.  Acute abdominal series and CT were unremarkable except for  small left renal stone without hydronephrosis.  Her creatinine has returned  to normal along with serum calcium without hydronephrosis.  Her serum  creatinine and calcium have returned to normal with IV hydration but  she  remains with pain.  She now has mildly elevated AST and ALT.  She could have  choledocholithiasis although bile duct was not dilated on CT and she clearly  does not have biliary colic.  Her symptomatology is very peculiar.  Since  she has not improved with supportive therapy, I am afraid further evaluation  is warranted.   PLAN:  1.  Will give her IV fluids with Joyce Gross Ciel supplement since her serum      potassium was low earlier today.  2.  Morphine sulfate 3 mg IV q. 3 p.r.n. pain.  3.  Her nausea and vomiting will be treated with Zofran IV and she will also      be given Protonix IV.  4.  Will proceed with CT angio abdomen.  If this study is normal will do an      EGD and MRCP with attention to bile duct.  5.  Will hold off her hydrochlorothiazide for now.   She may also need to undergo colonoscopy but this will be delayed until her  nausea, vomiting has resolved.  Her pain suspicious for neuropathic pain; however, this would not explain  her nausea and vomiting.  Intestinal ischemia needs to be ruled out since  pain seemed to get worse with meals.  She could have peptic ulcer disease or  biliary colic although her pain is not localized to upper abdomen.   PLAN:  She will be treated with IV fluids with Joyce Gross Ciel supplement to treat  her hyperkalemia along with  Protonix IV and Zofran p.r.n.   She will have CBC with DIF, Chem 20 and sed rate in a.m.  Next CT angio  abdomen hopefully this afternoon.  If CT angio was normal, will proceed with  esophagogastroduodenoscopy and possibly MRCP.      Lionel December, M.D.  Electronically Signed     NR/MEDQ  D:  10/02/2005  T:  10/02/2005  Job:  [id number]   cc:   Selinda Flavin  Fax: [phone]

## 2010-10-05 NOTE — Consult Note (Signed)
NAME:  Traci Clark, Traci Clark                          ACCOUNT NO.:  [id number]   MEDICAL RECORD NO.:  [id number]                   PATIENT TYPE:  INP   LOCATION:  4707                                 FACILITY:  [facility]   PHYSICIAN:  Iva Boop, M.D. [facility]           DATE OF BIRTH:  1931/02/17   DATE OF CONSULTATION:  11/05/2002  DATE OF DISCHARGE:                                   CONSULTATION   REASON FOR CONSULTATION:  Abnormal lipase and amylase, question  pancreatitis.   HISTORY:  This is a 77 year old black woman that has known coronary artery  disease and is having substernal chest pain.  She was admitted by the  cardiologist by the way of coming down to [facility] and [place] and  underwent cardiac catheterization which showed some coronary artery disease,  good ejection fraction but the coronary artery disease was nonobstructive.  Subsequent laboratory tests revealed an elevated amylase and lipase.  She  says last Friday she started to have problems with low abdominal pain and  felt like she could not urinate properly.  She felt like something was  dropping out and the pain spread up into the abdomen and into the chest with  nausea and emesis without blood.  She felt short of breath, sweaty, has  chronic dizziness.  Amylase  and lipase were both in the mid 300's today,  her LFT's are normal.  She is status post cholecystectomy.  She also had  colon cancer resected in 9/03; no chemotherapy or radiation.  She takes  Metamucil daily,  Aleve every other day and Prilosec every other day for heart burn (she does  complain of some vague upper dysphagia).  She has not had any follow up  colonoscopy though that was only approximately nine months ago.  She feels  better since she was admitted here.   ALLERGIES:  DARVOCET N 100.   MEDICATIONS:  1. Protonix 40 mg daily.  2. Antivert.  3. Aspirin.  4. Multivitamin.  5. Valium.  6. Heparin drip which has been discontinued.   PAST  MEDICAL HISTORY:  1. As above.  2. Tubal ligation.  3. Myocardial infarction in the 1990's.  4. Stroke x3.  5. Degenerative joint disease.  6. Status post cholecystectomy.   SOCIAL HISTORY:  She lives in [place] with her husband.  No alcohol or  tobacco.   FAMILY HISTORY:  No colorectal cancer, liver or pancreatic problems.   REVIEW OF SYSTEMS:  She has the dysuria type symptoms as mentioned.  She was  having leakage of urine when she was clapping at church on Sunday.  She has  never had that trouble before.  She does have pain in her knees and hands,  and season allergies not mentioned however.  Some mild insomnia.  All other  systems are negative.   PHYSICAL EXAMINATION:  GENERAL: Pleasant, obese black woman in no acute  distress.  VITAL SIGNS: Temperature 97.5, blood pressure 158/88, pulse 48, respirations  14.  HEENT: Eyes are anicteric.  Mouth if free of lesions.  NECK: Supple, no thyromegaly or mass.  CHEST: Clear.  HEART: S1, S2 normal, no murmurs or gallops.  ABDOMEN: Obese, soft, nontender.  There is a large right upper quadrant scar  she says is from prior colon surgery.  RECTAL: Exam per [facility]'s note: fecal occult blood negative, scant stool,  tone normal.  EXTREMITIES: No clubbing, cyanosis or edema.  PSYCHE: Appropriate affect.  NEUROLOGIC: She is alert and oriented x3.  SKIN: I see no rash.  LYMPH NODES: No neck or groin adenopathy detected.   LABORATORY DATA:  Hemoglobin 11.2, hematocrit 33.4, white count 5500,  platelets 170,000, MCV 87.  Her initial hemoglobin in [place] showed a  hemoglobin of 12 which was normal.  EKG shows sinus bradycardia with no  acute ischemic changes were clear, she did have some anterolateral ST, T  wave changes which could represent ischemia and some premature  supraventricular contractions.  Her BMET was normal, glucose was 121.  Her  calcium was normal.  Lipase is 342 and amylase is 328.  Cardiac panel  negative.  Coags  normal.   Portable abdominal exam negative other than degenerative changes in the hips  and spine.   ASSESSMENT:  1. Abnormal pancreatic enzymes with abdominal pain, nausea, vomiting which     is better.  2. Chest pain which does not appear to be cardiac in origin.  3. She is status post cholecystectomy.   IMPRESSION:  This is an atypical presentation for pancreatitis though that  is possible.  I am also concerned about possible bowel and/or pancreatic  involvement from metastatic colon cancer though that seems unlikely, she  does not give a history of weight loss or any progressive decline.  I am not  sure where the urinary symptoms come from though, if she has some sort  phlegmon or something like that, it could have been irritating the bladder.  A space occupying lesion certainly could cause that.  She has a mild anemia  but I think that is related to phlebotomies since her hemoglobin was normal  in [place].   PLAN:  CT scan of the abdomen and pelvis tomorrow since she had contrast  today, will go with that tomorrow.  Further plans pending that.   I appreciate the opportunity to care for this patient.                                                Iva Boop, M.D. [facility]    CEG/MEDQ  D:  11/05/2002  T:  11/08/2002  Job:  [id number]

## 2010-10-05 NOTE — Discharge Summary (Signed)
Traci Clark, Traci Clark                ACCOUNT NO.:  [id number]   MEDICAL RECORD NO.:  [id number]          PATIENT TYPE:  INP   LOCATION:  A326                          FACILITY:  [facility]   PHYSICIAN:  Lionel December, M.D.    DATE OF BIRTH:  [birth year]   DATE OF ADMISSION:  10/02/2005  DATE OF DISCHARGE:  05/22/2007LH                                 DISCHARGE SUMMARY   PRIMARY ADMISSION DIAGNOSES:  Unexplained generalized abdominal pain with  nausea and vomiting.   SECONDARY ADMISSION DIAGNOSES:  1.  Chronic gastroesophageal reflux disease.  2.  History of cecal carcinoma, status post right hemicolectomy 2004.  3.  History of biliary pancreatitis in 1998.  4.  Status post cholecystectomy.  5.  Hypertension.  6.  Anxiety disorder.   DISCHARGE DIAGNOSES:  1.  Unexplained chronic generalized abdominal pain.  2.  Unexplained elevated liver function tests.  3.  Chronic nausea and vomiting, etiology not well defined, possibly due to      gastroparesis.   SECONDARY DISCHARGE DIAGNOSES:  1.  Chronic gastroesophageal reflux disease.  2.  History of cecal carcinoma.  3.  History of biliary pancreatitis 1998.  4.  Hypertension.  5.  Anxiety disorder.   ATTENDING PHYSICIAN:  Lionel December, M.D.   PROCEDURES:  1.  CT angiogram on Oct 03, 2005 negative for mesenteric ischemia.  2.  Esophagogastroduodenoscopy on Oct 03, 2005 was normal.  3.  MRCP on Oct 03, 2005 revealed no evidence of biliary ductal dilatation      or choledocholithiasis, bilateral renal lesions of varying complexity,      hepatic steatosis, simple hepatic cyst.  4.  Upper gastrointestinal series with small bowel follow through on Oct 07, 2005 revealed a distal esophageal spasm and/or stenosis causing delay in      passage of both barium and a 3 x 13 mm tablet.  No neoplasia or filling      defects seen.  Transitory spasm duodenal C loop.  Anastomosis of the      small bowel to the hepatic flexure of the colon was  noted.  No      obstruction of the bowel noted.  Dr. Jena Gauss reviewed findings and felt      the barium pill traversed without difficulty.   HISTORY OF PRESENT ILLNESS:  The patient is a 77 year old African-American  female who was transferred from [facility] for unexplained  generalized abdominal pain, nausea and vomiting.  The patient reported three  week history of burning, generalized abdominal pain like her abdomen was on  fire.  This was associated with nausea and vomiting and anorexia.  She  denied any focal abdominal pain.  She has actually had chronic abdominal  pain according to Dr. Jeannette How note ever since her right hemicolectomy in  2003.  She states she never had pain like this before.  She was seen in the  emergency department at [facility] on Sep 29, 2005 and  underwent abdominal and pelvic CT scan along with acute abdominal series.  She was noted to  have numerous phleboliths and vascular calcifications on  abdominal series. She had bilateral renal cysts, one not to be characterized  but felt to be stable on abdominal/pelvic CT scan compared to CT scan in  2006.  She had a single nonobstructive left renal stone, a hepatic cyst in  the right lobe.  No abdominal aneurysm seen although non aneurysmal  tortuosity and ectasia to the abdominal aorta was noted.  At [facility] her  labs included essentially normal CBC.  Initially her liver function tests  were unremarkable.  However, on Oct 02, 2005 she was noted to have increase  in her transaminases, AST 70, ALT 67, amylase also doubled to 259.  Lipase  was normal at 33 on admission but no further lipase was obtained.  She was  transferred to our gastroenterology service at [facility].   HOSPITAL COURSE:  Upon presentation to [facility], she initially  underwent a CT angiogram the following day.  This was negative for  mesenteric ischemia.  Repeat liver function tests revealed a drop  in her AST  to 54 and ALT to 58. Her sed rate was minimally elevated at 25 and CBC again  unremarkable.  The following day her AST had dropped further to 38, ALT to  44.  She underwent esophagogastroduodenoscopy which was normal.  Because of  her elevated liver function tests she underwent further evaluation for  choledocholithiasis.  MRCP was negative for this.  She was found to have  renal cysts too which were possibly hemorrhagic and recommended a followup  CT scan at some point but not felt to be cause of any of her symptoms.  The  common bile duct was 6 to 7 mm.  Because of her unexplained symptoms she  underwent a cortisol level which was low at 3.8, normal being 4.3 to 22.4.  This was obtained at 6:30 A.M., however.  Cortrosyn stimulation was  performed.  At this point, at 9:00 A.M. on the day of testing, her cortisol  level was normal at 14.8 and post stimulation was 22.4.  ACTH was pending at  the time of discharge.  She started to improve on Reglan with regards to her  nausea and vomiting.  Her abdominal pain appeared to improve as well.  She  underwent further testing and upper gastrointestinal series was fairly  unremarkable other than questionable dysmotility of the distal esophagus.  See report for details of the test.  She was started on Pamelor in hopes to  help with her generalized abdominal pain.  She was switched to p.o.  narcotics.  On the day of discharge her TSH level was 5.778.   With regards to secondary diagnoses including chronic gastroesophageal  reflux disease, hypertension, anxiety, these all remained stable during her  hospitalization.   DISCHARGE PHYSICAL EXAMINATION:  At the time of discharge her physical  examination included temperature 98.1, pulse 54, respirations 20, blood  pressure 130/79.  __________ lower extremity edema.   DISPOSITION:  Stable, discharged to home.   DISCHARGE INSTRUCTIONS:   DISCHARGE MEDICATIONS: 1.  She is to resume her  Valium, Protonix, Tagamet, Meclizine and      hydrochlorothiazide as before.  2.  Ultram 50 mg p.o. t.i.d. p.r.n. pain, was provided a prescription, given      #30 with 0 refills.  3.  __________ #30, one refill.   FOLLOWUP:  She is to return to Dr. Patty Sermons office to see Nicholas Lose, N.P. on October 23, 2005 at  11:15.  She was asked to notify our  office if she had recurrent vomiting, worsening abdominal pain or bloody  stools.   At office followup we will discuss timing of her followup CT scan to follow  her renal cyst, possibly hemorrhagic renal cyst.  She is also going to be  due for a surveillance colonoscopy given history of __________ further  therapy  with Reglan and Pamelor.  We will recheck her liver function tests at some  point.  She has not had work up of elevated liver function tests and if this  recurs she will need to have that.  Cannot exclude the possibility for need  of further evaluation/work up for Sphincter of Oddi dysfunction, etc.      Tana Coast, P.A.      Lionel December, M.D.  Electronically Signed    LL/MEDQ  D:  10/10/2005  T:  10/11/2005  Job:  [id number]   cc:   Selinda Flavin  Fax: [phone]

## 2010-10-05 NOTE — H&P (Signed)
NAME:  Traci Clark, Traci Clark                 ACCOUNT NO.:  [id number]   MEDICAL RECORD NO.:  [id number]          PATIENT TYPE:  AMB   LOCATION:                                FACILITY:  [facility]   PHYSICIAN:  R. Roetta Sessions, M.D. DATE OF BIRTH:  [birth year]   DATE OF ADMISSION:  11/25/2005  DATE OF DISCHARGE:  LH                                HISTORY & PHYSICAL   CHIEF COMPLAINT:  Nausea, vomiting, weight loss and anorexia with need for  colonoscopy, history of colon cancer.   Traci Clark is a pleasant 77 year old [place] American female from  [place], [place], followed primarily by Dr. Selinda Flavin in [place],  [place].  He was recently hospitalized x2 at [facility] for  nausea, vomiting and anorexia.  She was also having some nonspecific  abdominal pain.  She underwent extensive battery of testing including a CT  angiogram which demonstrated no significant visceral vascular stenosis.  MRCP with no evidence of biliary ductal dilation but she had a mild bump in  her LFTs.  EGD Oct 03, 2005, was normal. Upper GI small-bowel follow-through  revealed no explanation for her symptoms.  She did have a slightly low a.m.  cortisol.  Cortrosyn stimulation test demonstrated a 14.8 baseline to 22.8  stimulated cortisol level.  She was started on some Pamelor during her last  hospitalization as well as Reglan 10 mg a.c. and h.s.  She currently is on  Reglan and Nexium 40 mg orally daily.  She does take Boost two to three  times daily.  She has on the order of two episodes of vomiting weekly,  vomiting occurs almost instantaneously without a nausea prodrome per her  report.  She really cannot tell me if she has early satiety or not as she  just does not want to eat and her bowels, however, seem to be moving fairly  well on MiraLax.  She denies constipation.  However, her weight notably has  gone from 179.5 pounds on October 23, 2005, down to 164 pounds.  She does  complain of suprapubic  burning and dysuria.  Her urinalysis was negative  from October 24, 2005.  There was nothing on her prior CTs aside from her  bilateral renal cysts and some phleboliths and vascular calcifications.  She  did have a mild bump in her transaminases which improved.  Her amylase was  slightly elevated at 259.  The etiology of her abdominal pain was not  explained.  Abdominal pain has since pretty much resolved.  She had a bump  in her LFTs, also unexplained.  It was notable she had a history of biliary  pancreatitis back in 1998.  Her gallbladder is out.  She has not had any  melena or rectal bleeding and no hematemesis, no odynophagia, no dysphagia.  Again, reflux symptoms are well controlled on Nexium.  TSH on the day of her  discharge on Oct 08, 2005, came back at 5.778.   CURRENT MEDICATIONS:  1.  Meclizine 25 mg daily p.r.n.  2.  Tylenol q.i.d. p.r.n.  3.  Claritin D p.r.n.  4.  Hydrochlorothiazide 25 mg daily.  5.  Diazepam 5 mg one half tablet one daily.  6.  Nexium 40 mg orally daily.  7.  Tagamet nightly p.r.n.  8.  Zoloft 50 mg one half tablet daily.  9.  Nortriptyline 10 mg at bedtime.  10. Reglan 10 mg a.c. and h.s.  11. MiraLax 17 g orally daily.  12. Eye drops both eyes nightly.  13. Tylenol No. 3 one q.4h. p.r.n.   ALLERGIES:  1.  DARVOCET.  2.  HYDROCODONE.  3.  LIPITOR (nausea and sick feeling).   PAST MEDICAL HISTORY:  1.  Chronic GERD with dysphagia requiring multiple dilations in the past.  2.  History of cecal carcinoma status post right hemicolectomy, Dr.      Cleotis Nipper in [place], [place], in 2003.  3.  Follow-up surveillance colonoscopy per [facility] were reportedly negative.  4.  History of biliary pancreatitis in 1998.  She did not require ERCP.  5.  She underwent laparoscopic cholecystectomy.  6.  History of hypertension.  7.  Generalized osteoarthritis.  8.  Anxiety disorder.  9.  Status post tubal ligation.  10. History of lower extremity DVT.  11.  History of hearing loss followed by Dr. Truman Hayward in [place], [place].   FAMILY HISTORY:  Two brothers died of colon cancer, one at age 59 and one at  age 90.  One brother has prostate cancer.  One sister has head and neck  carcinoma.   SOCIAL HISTORY:  Patient is married.  She has seven children, five of whom  are diabetic, three are insulin dependent.  She is retired from [employer].  No tobacco, no alcohol.   REVIEW OF SYSTEMS:  As above.  No chest pain, dyspnea on exertion, no fever  or chills.   PHYSICAL EXAMINATION:  GENERAL APPEARANCE:  A pleasant 77 year old lady  resting comfortably.  VITAL SIGNS:  Weight 164, height 5 feet 6 inches, temperature 98.1, blood  pressure 118/80, pulse 68.  SKIN:  Warm and dry.  HEENT:  No scleral icterus.  Conjunctivae are pink.  Oral cavity with no  lesions.  CHEST:  Lungs are clear to auscultation.  CARDIOVASCULAR:  Regular rate and rhythm without murmurs, rubs, or gallops.  ABDOMEN:  Soft.  There is no succussion splash, no bruit.  She does have  some suprapubic tenderness to palpation. There is no CVA tenderness.  RECTAL:  Exam is deferred until the time of colonoscopy.   IMPRESSION:  Traci Clark is a pleasant 77 year old lady with a good  two and a half to three month history of intermittent vomiting, anorexia.  She really has otherwise no abdominal pain aside from some suprapubic  tenderness and some dysuria (negative urinalysis October 24, 2005, through this  office).  Does not really have any flank pain.  She has a paucity of  abdominal pain otherwise and gets very little warning when she develops  vomiting.  Her recent weight loss is very much concerning, 15-1/2 pounds in  a little over a month.  It is notable we last saw this lady for reflux and  dysphagia back in June of 2005, she weighed 189 pounds.   She has had an extensive evaluation without yielding a cause for her current symptoms.  Partial  small-bowel obstruction, gastric outlet obstruction and  critical visceral mesenteric artery stenosis extremely unlikely given her  extensive GI work-up recently.  Her suprapubic discomfort and dysuria need  to be readdressed.   For the history of colon cancer, she needs to go ahead and  have a  surveillance colonoscopy now, although I am doubtful that will yield a cause  for her symptoms.   RECOMMENDATIONS:  Will go ahead and proceed with a surveillance colonoscopy  forthwith and repeat a urinalysis today and then will render further  recommendations once we have those results back to ponder.  Will make  further recommendations.      Jonathon Bellows, M.D.  Electronically Signed     RMR/MEDQ  D:  11/25/2005  T:  11/25/2005  Job:  [id number]   cc:   Selinda Flavin  Fax: [phone]

## 2011-02-28 LAB — CBC
HCT: 38.3
Hemoglobin: 12.7
MCV: 89.3
Platelets: 220
RBC: 4.29
WBC: 7.6

## 2011-02-28 LAB — LIPASE, BLOOD: Lipase: 25

## 2011-02-28 LAB — I-STAT 8, (EC8 V) (CONVERTED LAB)
Acid-Base Excess: 9 — ABNORMAL HIGH
Bicarbonate: 31 — ABNORMAL HIGH
Glucose, Bld: 123 — ABNORMAL HIGH
Operator id: 151321
TCO2: 32
pH, Ven: 7.562 — ABNORMAL HIGH

## 2011-02-28 LAB — HEPATIC FUNCTION PANEL
ALT: 23
AST: 33
Albumin: 3.8
Indirect Bilirubin: 1.2 — ABNORMAL HIGH

## 2011-02-28 LAB — URINALYSIS, ROUTINE W REFLEX MICROSCOPIC
Glucose, UA: NEGATIVE
Hgb urine dipstick: NEGATIVE
Urobilinogen, UA: 0.2
pH: 5.5

## 2011-02-28 LAB — DIFFERENTIAL: Monocytes Absolute: 0.7

## 2011-02-28 LAB — POCT I-STAT CREATININE: Operator id: 151321

## 2011-11-04 DIAGNOSIS — R079 Chest pain, unspecified: Secondary | ICD-10-CM

## 2012-07-08 ENCOUNTER — Encounter (INDEPENDENT_AMBULATORY_CARE_PROVIDER_SITE_OTHER): Payer: Self-pay | Admitting: *Deleted

## 2012-07-14 ENCOUNTER — Encounter (HOSPITAL_COMMUNITY): Payer: Self-pay | Admitting: Pharmacy Technician

## 2012-07-14 ENCOUNTER — Other Ambulatory Visit (INDEPENDENT_AMBULATORY_CARE_PROVIDER_SITE_OTHER): Payer: Self-pay | Admitting: *Deleted

## 2012-07-14 ENCOUNTER — Encounter (INDEPENDENT_AMBULATORY_CARE_PROVIDER_SITE_OTHER): Payer: Self-pay | Admitting: Internal Medicine

## 2012-07-14 ENCOUNTER — Ambulatory Visit (INDEPENDENT_AMBULATORY_CARE_PROVIDER_SITE_OTHER): Payer: PRIVATE HEALTH INSURANCE | Admitting: Internal Medicine

## 2012-07-14 ENCOUNTER — Telehealth (INDEPENDENT_AMBULATORY_CARE_PROVIDER_SITE_OTHER): Payer: Self-pay | Admitting: *Deleted

## 2012-07-14 VITALS — BP 148/60 | HR 60 | Temp 98.0°F | Ht 67.0 in | Wt 164.6 lb

## 2012-07-14 DIAGNOSIS — C189 Malignant neoplasm of colon, unspecified: Secondary | ICD-10-CM

## 2012-07-14 DIAGNOSIS — Z85038 Personal history of other malignant neoplasm of large intestine: Secondary | ICD-10-CM

## 2012-07-14 MED ORDER — PEG-KCL-NACL-NASULF-NA ASC-C 100 G PO SOLR: 1.0000 | Freq: Once | ORAL | Status: DC

## 2012-07-14 NOTE — Patient Instructions (Addendum)
Colonoscopy with Dr. Rehman 

## 2012-07-14 NOTE — Progress Notes (Signed)
Subjective:     Patient ID: Traci Clark, female   DOB: 27-Nov-1930, 79 y.o.   MRN: [id number]  HPI Referred to our office for a surveillance colonoscopy. Her last colonoscopy was in 2007 by Dr. Jena Gauss. Please see below. She tell me she had colon cancer in 2003 and underwent a rt hemicolectomy by Dr. Cleotis Nipper. Appetite is good. She has gained 10 pounds since seeing Dr. Dimas Aguas last month. She tells me she has nausea after colonoscopies. She tells me her BMs are normal. She usually has a BM daily. No melena or bright red rectal bleeding.  She was recently in the hospital in [place] with Colitis in January.  She was admitted x 5 days. She had diarrhea. Grandson states she was discharged on antibiotics.    06/26/2012 Calcium 10.4, Protein 7.6, Albumin 4.3, WBC 4.7 H and H 13.7 and 41.2, MCV 89. 05/04/2008 Dr. Cleotis Nipper:  Normal. Small hemorrhoids 10/03/2005 EGD Dr. Karilyn Cota: normal 11/28/2005 Colonoscopy  For 3 months hx of intermittent nausea, vomiting, anorexia, weight loss, history of colon cancer, status post rt hemicolectomy, previously. Impression: Normal Review of Systems see hpi Current Outpatient Prescriptions  Medication Sig Dispense Refill  . acetaminophen (TYLENOL) 325 MG tablet Take 650 mg by mouth every 6 (six) hours as needed for pain.      Marland Kitchen ALPRAZolam (XANAX) 0.5 MG tablet Take 0.5 mg by mouth at bedtime as needed for sleep.      Marland Kitchen esomeprazole (NEXIUM) 40 MG capsule Take 40 mg by mouth daily before breakfast.      . gabapentin (NEURONTIN) 300 MG capsule Take 300 mg by mouth 3 (three) times daily.      . meclizine (ANTIVERT) 25 MG tablet Take 25 mg by mouth 3 (three) times daily as needed.      . Multiple Vitamin (MULTIVITAMIN) tablet Take 1 tablet by mouth daily.      . ondansetron (ZOFRAN) 4 MG tablet Take 4 mg by mouth every 8 (eight) hours as needed for nausea.      . promethazine (PHENERGAN) 25 MG tablet Take 25 mg by mouth every 6 (six) hours as needed for nausea.       . traMADol (ULTRAM) 50 MG tablet Take 50 mg by mouth every 6 (six) hours as needed for pain.      . Travoprost, BAK Free, (TRAVATAN) 0.004 % SOLN ophthalmic solution 1 drop at bedtime.       No current facility-administered medications for this visit.   Past Medical History  Diagnosis Date  . Colon cancer   . DVT (deep vein thrombosis) in pregnancy   . MI (myocardial infarction)          Objective:   Physical Exam  Filed Vitals:   07/14/12 1051  BP: 148/60  Pulse: 60  Temp: 98 F (36.7 C)  Height: 5\' 7"  (1.702 m)  Weight: 164 lb 9.6 oz (74.662 kg)  Alert and oriented. Skin warm and dry. Oral mucosa is moist.   . Sclera anicteric, conjunctivae is pink. Thyroid not enlarged. No cervical lymphadenopathy. Lungs clear. Heart regular rate and rhythm.  Abdomen is soft. Bowel sounds are positive. No hepatomegaly. No abdominal masses felt. No tenderness.  No edema to lower extremities.       Assessment:    Hx of colon cancer. In need of surveillance colonoscopy. Normal exam today    Plan:    Surveillance colonoscopy

## 2012-07-14 NOTE — Telephone Encounter (Signed)
Patient needs movi prep 

## 2012-07-29 ENCOUNTER — Encounter (HOSPITAL_COMMUNITY): Payer: Self-pay | Admitting: *Deleted

## 2012-07-29 ENCOUNTER — Ambulatory Visit (HOSPITAL_COMMUNITY)
Admission: RE | Admit: 2012-07-29 | Discharge: 2012-07-29 | Disposition: A | Discharge status: Home / Self Care | Payer: PRIVATE HEALTH INSURANCE | Source: Ambulatory Visit | Attending: Internal Medicine | Admitting: Internal Medicine

## 2012-07-29 ENCOUNTER — Encounter (HOSPITAL_COMMUNITY)
Admission: RE | Disposition: A | Discharge status: Home / Self Care | Payer: Self-pay | Source: Ambulatory Visit | Attending: Internal Medicine

## 2012-07-29 DIAGNOSIS — Z85048 Personal history of other malignant neoplasm of rectum, rectosigmoid junction, and anus: Secondary | ICD-10-CM

## 2012-07-29 DIAGNOSIS — Z09 Encounter for follow-up examination after completed treatment for conditions other than malignant neoplasm: Secondary | ICD-10-CM | POA: Insufficient documentation

## 2012-07-29 DIAGNOSIS — Z85038 Personal history of other malignant neoplasm of large intestine: Secondary | ICD-10-CM | POA: Insufficient documentation

## 2012-07-29 DIAGNOSIS — Z9049 Acquired absence of other specified parts of digestive tract: Secondary | ICD-10-CM | POA: Insufficient documentation

## 2012-07-29 HISTORY — PX: COLONOSCOPY: SHX5424

## 2012-07-29 HISTORY — DX: Personal history of other diseases of the digestive system: Z87.19

## 2012-07-29 HISTORY — DX: Gastro-esophageal reflux disease without esophagitis: K21.9

## 2012-07-29 SURGERY — COLONOSCOPY
Anesthesia: Moderate Sedation

## 2012-07-29 MED ORDER — ONDANSETRON HCL 4 MG/2ML IJ SOLN
4.0000 mg | Freq: Once | INTRAMUSCULAR | Status: AC
  Administered 2012-07-29: 4 mg via INTRAVENOUS

## 2012-07-29 MED ORDER — SODIUM CHLORIDE 0.45 % IV SOLN
INTRAVENOUS | Status: DC
  Administered 2012-07-29: 13:00:00 via INTRAVENOUS

## 2012-07-29 MED ORDER — STERILE WATER FOR IRRIGATION IR SOLN
Status: DC | PRN
  Administered 2012-07-29: 14:00:00

## 2012-07-29 MED ORDER — MIDAZOLAM HCL 5 MG/5ML IJ SOLN
INTRAMUSCULAR | Status: DC | PRN
  Administered 2012-07-29 (×3): 2 mg via INTRAVENOUS

## 2012-07-29 MED ORDER — MEPERIDINE HCL 50 MG/ML IJ SOLN
INTRAMUSCULAR | Status: DC | PRN
  Administered 2012-07-29 (×2): 25 mg via INTRAVENOUS

## 2012-07-29 MED ORDER — ONDANSETRON HCL 4 MG/2ML IJ SOLN
INTRAMUSCULAR | Status: AC
  Filled 2012-07-29: qty 2

## 2012-07-29 MED ORDER — MEPERIDINE HCL 50 MG/ML IJ SOLN
INTRAMUSCULAR | Status: AC
  Filled 2012-07-29: qty 1

## 2012-07-29 MED ORDER — MIDAZOLAM HCL 5 MG/5ML IJ SOLN
INTRAMUSCULAR | Status: AC
  Filled 2012-07-29: qty 10

## 2012-07-29 NOTE — Op Note (Addendum)
COLONOSCOPY PROCEDURE REPORT  PATIENT:  Traci Clark  MR#:  [id number] Birthdate:  1930-08-18, 79 y.o., female Endoscopist:  Dr. Malissa Hippo, MD Referred By:  Dr. Selinda Flavin, MD Procedure Date: 07/29/2012  Procedure:   Colonoscopy  Indications:  Patient is 79 year old African American female with history of colon carcinoma. She is status post right, colectomy in 2003. She is here for surveillance colonoscopy. Her last exam was in 2009. 2 months ago she was admitted to the hospital in [facility] for enterocolitis or colitis and has fully recovered.  Informed Consent:  The procedure and risks were reviewed with the patient and informed consent was obtained.  Medications:  Demerol 50 mg IV Versed 6 mg IV  Description of procedure:  After a digital rectal exam was performed, that colonoscope was advanced from the anus through the rectum and colon to region of hepatic flexure there ileocolonic anastomosis was identified.  Anastomosis was wide open. it photographed for the record. From the level of ileocolonic anastomosis the scope was slowly and cautiously withdrawn. The mucosal surfaces were carefully surveyed utilizing scope tip to flexion to facilitate fold flattening as needed. The scope was pulled down into the rectum where a thorough exam including retroflexion was performed.  Findings:   Prep excellent Normal mucosa of transverse, descending, sigmoid colon and rectum. Unremarkable anorectal junction.    Therapeutic/Diagnostic Maneuvers Performed:  None  Complications:  None  Cecal Withdrawal Time:  9 minutes  Impression:  Wide-open ileocolonic anastomosis. Normal colonoscopy.  Recommendations:  Standard instructions given. Colonoscopy in 4 years.  REHMAN,NAJEEB U  07/29/2012 2:11 PM  CC: Dr. Selinda Flavin, MD & Dr. Bonnetta Barry ref. Reannah Totten found

## 2012-07-29 NOTE — H&P (Signed)
Traci Clark is an 79 y.o. female.   Chief Complaint: Patient is here for colonoscopy. HPI: Patient is 78 year old African female who is here for surveillance colonoscopy. He has history of colon carcinoma status post right hemicolectomy 2003. Last colonoscopy was in December 2009. She was hospitalized in January 2014 at [facility] in [place] for diarrhea possibly secondary to infection. She has fully recovered. She denies rectal bleeding.  Him history is negative for colorectal carcinoma.  Past Medical History  Diagnosis Date  . Colon cancer   . DVT (deep vein thrombosis) in pregnancy   . MI (myocardial infarction)   . GERD (gastroesophageal reflux disease)   . H/O hiatal hernia     Past Surgical History  Procedure Laterality Date  . Cholecystectomy    . Tubal ligation    . Vein surgery      stripped vein due to DVT    History reviewed. No pertinent family history. Social History:  reports that she has never smoked. She does not have any smokeless tobacco history on file. She reports that she does not drink alcohol or use illicit drugs.  Allergies:  Allergies  Allergen Reactions  . Codeine     Medications Prior to Admission  Medication Sig Dispense Refill  . acetaminophen (TYLENOL) 325 MG tablet Take 650 mg by mouth every 6 (six) hours as needed for pain.      Marland Kitchen ALPRAZolam (XANAX) 0.5 MG tablet Take 0.5 mg by mouth at bedtime as needed for sleep.      Marland Kitchen aspirin 81 MG tablet Take 81 mg by mouth daily.      Marland Kitchen esomeprazole (NEXIUM) 40 MG capsule Take 40 mg by mouth daily before breakfast.      . gabapentin (NEURONTIN) 300 MG capsule Take 300 mg by mouth 3 (three) times daily.      . meclizine (ANTIVERT) 25 MG tablet Take 25 mg by mouth 3 (three) times daily as needed.      . Multiple Vitamin (MULTIVITAMIN) tablet Take 1 tablet by mouth daily.      . ondansetron (ZOFRAN) 4 MG tablet Take 4 mg by mouth every 8 (eight) hours as needed for nausea.      . peg 3350 powder  (MOVIPREP) 100 G SOLR Take 1 kit (100 g total) by mouth once.  1 kit  0  . promethazine (PHENERGAN) 25 MG tablet Take 25 mg by mouth every 6 (six) hours as needed for nausea.      . traMADol (ULTRAM) 50 MG tablet Take 50 mg by mouth every 6 (six) hours as needed for pain.      . Travoprost, BAK Free, (TRAVATAN) 0.004 % SOLN ophthalmic solution Place 1 drop into both eyes at bedtime.         No results found for this or any previous visit (from the past 48 hour(s)). No results found.  ROS  Blood pressure 187/90, temperature 97.5 F (36.4 C), temperature source Oral, resp. rate 22, height 5\' 6"  (1.676 m), weight 162 lb (73.483 kg), SpO2 97.00%. Physical Exam  Constitutional: She appears well-developed and well-nourished.  HENT:  Mouth/Throat: Oropharynx is clear and moist.  Eyes: Conjunctivae are normal. No scleral icterus.  Neck: No thyromegaly present.  Cardiovascular: Normal rate, regular rhythm and normal heart sounds.   No murmur heard. Respiratory: Effort normal and breath sounds normal.  GI: Soft. Tenderness: mild left low quadrant tenderness.  Musculoskeletal: She exhibits no edema.  Lymphadenopathy:    She has  no cervical adenopathy.  Neurological: She is alert.  Skin: Skin is warm and dry.     Assessment/Plan Surveillance colonoscopy. History of colon carcinoma. Status post right, colectomy in 2003.  REHMAN,NAJEEB U 07/29/2012, 1:33 PM

## 2012-07-29 NOTE — Op Note (Signed)
At anastomosis 1357-Colon withdrawal time 9 minutes.

## 2012-08-04 ENCOUNTER — Encounter (HOSPITAL_COMMUNITY): Payer: Self-pay | Admitting: Internal Medicine

## 2012-08-18 ENCOUNTER — Encounter (INDEPENDENT_AMBULATORY_CARE_PROVIDER_SITE_OTHER): Payer: Self-pay

## 2012-08-19 ENCOUNTER — Encounter (INDEPENDENT_AMBULATORY_CARE_PROVIDER_SITE_OTHER): Payer: Self-pay

## 2012-12-23 ENCOUNTER — Other Ambulatory Visit: Payer: Self-pay

## 2013-03-25 ENCOUNTER — Other Ambulatory Visit: Payer: Self-pay

## 2013-12-23 ENCOUNTER — Encounter (INDEPENDENT_AMBULATORY_CARE_PROVIDER_SITE_OTHER): Payer: Self-pay | Admitting: *Deleted

## 2014-03-22 ENCOUNTER — Encounter (INDEPENDENT_AMBULATORY_CARE_PROVIDER_SITE_OTHER): Payer: Self-pay | Admitting: Internal Medicine

## 2014-03-22 ENCOUNTER — Other Ambulatory Visit (INDEPENDENT_AMBULATORY_CARE_PROVIDER_SITE_OTHER): Payer: Self-pay | Admitting: *Deleted

## 2014-03-22 ENCOUNTER — Ambulatory Visit (INDEPENDENT_AMBULATORY_CARE_PROVIDER_SITE_OTHER): Payer: PRIVATE HEALTH INSURANCE | Admitting: Internal Medicine

## 2014-03-22 ENCOUNTER — Encounter (INDEPENDENT_AMBULATORY_CARE_PROVIDER_SITE_OTHER): Payer: Self-pay | Admitting: *Deleted

## 2014-03-22 VITALS — BP 130/66 | HR 64 | Temp 97.6°F | Resp 18 | Ht 67.0 in | Wt 164.4 lb

## 2014-03-22 DIAGNOSIS — Z789 Other specified health status: Secondary | ICD-10-CM

## 2014-03-22 DIAGNOSIS — K219 Gastro-esophageal reflux disease without esophagitis: Secondary | ICD-10-CM

## 2014-03-22 DIAGNOSIS — Z87898 Personal history of other specified conditions: Secondary | ICD-10-CM

## 2014-03-22 DIAGNOSIS — R1314 Dysphagia, pharyngoesophageal phase: Secondary | ICD-10-CM

## 2014-03-22 DIAGNOSIS — R1319 Other dysphagia: Secondary | ICD-10-CM

## 2014-03-22 DIAGNOSIS — R1310 Dysphagia, unspecified: Secondary | ICD-10-CM

## 2014-03-22 DIAGNOSIS — Z85038 Personal history of other malignant neoplasm of large intestine: Secondary | ICD-10-CM

## 2014-03-22 DIAGNOSIS — G2581 Restless legs syndrome: Secondary | ICD-10-CM

## 2014-03-22 NOTE — Progress Notes (Signed)
Presenting complaint;  Solid food dysphagia. History of weight loss.  History of present illness.  Patient is [name] was here for scheduled visit. She has history of colon carcinoma and remains in remission. She was last seen in for re-2014 when she weighed 64 pounds. Patient states she began losing weight in April this year. Weight loss continued until 3 months ago which time she had lost 25 pounds. In the last 3 months she has gained it back. She states she lost appetite. She only had one episode of nausea vomiting and abdominal pain and she was hospitalized at [facility] briefly. She denies diarrhea melena or rectal bleeding. She has occasional constipation. She did not experience fever or night sweats while she was losing weight. She had blood work by Dr. Nadara Clark and all of it was normal. She states she did not go on any new medication. She is on gabapentin for restless leg syndrome. She says aspirin was stopped 3 months ago. She also complains of solid food dysphagia which started 3 months ago. She points to suprasternal area side of bolus obstruction. She does not have any difficulty with liquids. She states heartburn is well controlled with PPI. She states esophageal dilation in the past has helped. Patient states her daughter became acutely ill in July 2015 she suffered CVA but has fully recovered. Patient does not smoke cigarettes or drink alcohol.   Current Medications: Outpatient Encounter Prescriptions as of 03/22/2014  Medication Sig  . acetaminophen (TYLENOL) 325 MG tablet Take 650 mg by mouth every 6 (six) hours as needed for pain.  Marland Kitchen ALPRAZolam (XANAX) 0.5 MG tablet Take 0.5 mg by mouth at bedtime as needed for sleep.  Marland Kitchen esomeprazole (NEXIUM) 40 MG capsule Take 40 mg by mouth daily before breakfast.  . gabapentin (NEURONTIN) 300 MG capsule Take 300 mg by mouth at bedtime.   . hydrochlorothiazide (HYDRODIURIL) 25 MG tablet Take 25 mg by mouth daily.  .  meclizine (ANTIVERT) 25 MG tablet Take 25 mg by mouth 3 (three) times daily as needed.  . mirabegron ER (MYRBETRIQ) 25 MG TB24 tablet Take 25 mg by mouth daily.  . Multiple Vitamin (MULTIVITAMIN) tablet Take 1 tablet by mouth daily.  Marland Kitchen Propylene Glycol (SYSTANE BALANCE OP) Apply 1 drop to eye at bedtime. 1 drop in both eyes  . traMADol (ULTRAM) 50 MG tablet Take 50 mg by mouth every 6 (six) hours as needed for pain.  . Travoprost, BAK Free, (TRAVATAN) 0.004 % SOLN ophthalmic solution Place 1 drop into both eyes at bedtime.   . [DISCONTINUED] aspirin 81 MG tablet Take 81 mg by mouth daily.  . [DISCONTINUED] ondansetron (ZOFRAN) 4 MG tablet Take 4 mg by mouth every 8 (eight) hours as needed for nausea.  . [DISCONTINUED] promethazine (PHENERGAN) 25 MG tablet Take 25 mg by mouth every 6 (six) hours as needed for nausea.   Past Medical History  Diagnosis Date  . Colon cancer   . DVT (deep vein thrombosis) in pregnancy   . MI (myocardial infarction)   . GERD (gastroesophageal reflux disease)   . H/O hiatal hernia    Past Surgical History  Procedure Laterality Date  . Cholecystectomy    . Tubal ligation    . Vein surgery      stripped vein due to DVT  . Colonoscopy N/A 07/29/2012    Procedure: COLONOSCOPY;  Surgeon: Traci Houston, MD;  [facility];  Service: Endoscopy;  Laterality: N/A;  100  Objective: Blood pressure 130/66, pulse 64, temperature 97.6 F (36.4 C), temperature source Oral, resp. rate 18, height 5\' 7"  (1.702 m), weight 164 lb 6.4 oz (74.571 kg). Patient is alert and in no acute distress. Conjunctiva is pink. Sclera is nonicteric Oropharyngeal mucosa is normal. No neck masses or thyromegaly noted. Cardiac exam with regular rhythm normal S1 and S2. No murmur or gallop noted. Lungs are clear to auscultation. Abdomen is symmetrical.on palpation abdomen is soft and nontender without organomegaly or masses. No LE edema or clubbing noted.  Labs/studies  Results: Abdominopelvic CT films from 02/15/2014 reviewed area of study done without contrast. She had evidence of diverticulosis and bilateral venous cyst but no adenopathy or pancreatic lesions.    Assessment:  #1. History of unexplained weight loss which has reversed. Lab studies and CT when she was briefly hospitalized at [facility] in September 2015 were unremarkable. #2. Solid food dysphagia. GERD symptoms are well-controlled with therapy. She could have Schatzki's ring esophageal stricture or motility disorder. Do not believe dysphagia has anything to do with her weight loss since weight loss began long before she had dysphagia. #3. History of CRC. She had right hemicolectomy in 2003. She is up-to-date on surveillance. Last exam was in March 2014 Exam would be in March 2018.    Plan:  Esophagogastroduodenoscopy with esophageal dilation. Request recent lab studies from Dr. Lyman Clark office. Office visit in 3 months unless she starts losing weight again. Next colonoscopy would be in March 2018.

## 2014-03-22 NOTE — Patient Instructions (Signed)
Esophagogastroduodenoscopy and esophageal dilation to be scheduled. 

## 2014-03-23 DIAGNOSIS — G2581 Restless legs syndrome: Secondary | ICD-10-CM | POA: Insufficient documentation

## 2014-03-23 DIAGNOSIS — K219 Gastro-esophageal reflux disease without esophagitis: Secondary | ICD-10-CM | POA: Insufficient documentation

## 2014-03-25 ENCOUNTER — Encounter (HOSPITAL_COMMUNITY)
Admission: RE | Disposition: A | Discharge status: Home / Self Care | Payer: Self-pay | Source: Ambulatory Visit | Attending: Internal Medicine

## 2014-03-25 ENCOUNTER — Encounter (HOSPITAL_COMMUNITY): Payer: Self-pay | Admitting: *Deleted

## 2014-03-25 ENCOUNTER — Ambulatory Visit (HOSPITAL_COMMUNITY)
Admission: RE | Admit: 2014-03-25 | Discharge: 2014-03-25 | Disposition: A | Discharge status: Home / Self Care | Payer: PRIVATE HEALTH INSURANCE | Source: Ambulatory Visit | Attending: Internal Medicine | Admitting: Internal Medicine

## 2014-03-25 DIAGNOSIS — K219 Gastro-esophageal reflux disease without esophagitis: Secondary | ICD-10-CM | POA: Insufficient documentation

## 2014-03-25 DIAGNOSIS — K222 Esophageal obstruction: Secondary | ICD-10-CM

## 2014-03-25 DIAGNOSIS — Z85038 Personal history of other malignant neoplasm of large intestine: Secondary | ICD-10-CM | POA: Insufficient documentation

## 2014-03-25 DIAGNOSIS — Z79899 Other long term (current) drug therapy: Secondary | ICD-10-CM | POA: Insufficient documentation

## 2014-03-25 DIAGNOSIS — K228 Other specified diseases of esophagus: Secondary | ICD-10-CM

## 2014-03-25 DIAGNOSIS — Z86718 Personal history of other venous thrombosis and embolism: Secondary | ICD-10-CM | POA: Insufficient documentation

## 2014-03-25 DIAGNOSIS — R1310 Dysphagia, unspecified: Secondary | ICD-10-CM

## 2014-03-25 HISTORY — PX: MALONEY DILATION: SHX5535

## 2014-03-25 HISTORY — PX: ESOPHAGOGASTRODUODENOSCOPY: SHX5428

## 2014-03-25 SURGERY — EGD (ESOPHAGOGASTRODUODENOSCOPY)
Anesthesia: Moderate Sedation

## 2014-03-25 MED ORDER — BUTAMBEN-TETRACAINE-BENZOCAINE 2-2-14 % EX AERO
INHALATION_SPRAY | CUTANEOUS | Status: DC | PRN
  Administered 2014-03-25: 2 via TOPICAL

## 2014-03-25 MED ORDER — MEPERIDINE HCL 50 MG/ML IJ SOLN
INTRAMUSCULAR | Status: AC
  Filled 2014-03-25: qty 1

## 2014-03-25 MED ORDER — MIDAZOLAM HCL 5 MG/5ML IJ SOLN
INTRAMUSCULAR | Status: DC | PRN
  Administered 2014-03-25: 1 mg via INTRAVENOUS
  Administered 2014-03-25 (×2): 2 mg via INTRAVENOUS

## 2014-03-25 MED ORDER — SODIUM CHLORIDE 0.9 % IV SOLN
INTRAVENOUS | Status: DC
Start: 2014-03-25 — End: 2014-03-25
  Administered 2014-03-25: 10:00:00 via INTRAVENOUS

## 2014-03-25 MED ORDER — MIDAZOLAM HCL 5 MG/5ML IJ SOLN
INTRAMUSCULAR | Status: AC
  Filled 2014-03-25: qty 10

## 2014-03-25 MED ORDER — MEPERIDINE HCL 50 MG/ML IJ SOLN
INTRAMUSCULAR | Status: DC | PRN
  Administered 2014-03-25 (×2): 25 mg via INTRAVENOUS

## 2014-03-25 MED ORDER — STERILE WATER FOR IRRIGATION IR SOLN
Status: DC | PRN
  Administered 2014-03-25: 11:00:00

## 2014-03-25 NOTE — H&P (Signed)
Traci Clark is an 79 y.o. female.   Chief Complaint:  Patient is here for EGD and ED. HPI: Patient is 80 year old African female who presents with3 months history of dysphagia to solids. She points to suprasternal area soft bolus obstruction. She has chronic GERD. She says heartburns well controlled with therapy. She also gives history of 25 pound weight loss between spring and summer but she has gained it all back. Dr. Nadara Mustard did multiple studies and these were normal. Patient complains of anorexia. According to records she has not lost any weight since April last year for  Past Medical History  Diagnosis Date  . Colon cancer   . DVT (deep vein thrombosis) in pregnancy   . MI (myocardial infarction)   . GERD (gastroesophageal reflux disease)   . H/O hiatal hernia     Past Surgical History  Procedure Laterality Date  . Cholecystectomy    . Tubal ligation    . Vein surgery      stripped vein due to DVT  . Colonoscopy N/A 07/29/2012    Procedure: COLONOSCOPY;  Surgeon: Rogene Houston, MD;  Location: [facility];  Service: Endoscopy;  Laterality: N/A;  100    History reviewed. No pertinent family history. Social History:  reports that she has never smoked. She has never used smokeless tobacco. She reports that she does not drink alcohol or use illicit drugs.  Allergies:  Allergies  Allergen Reactions  . Codeine     Medications Prior to Admission  Medication Sig Dispense Refill  . acetaminophen (TYLENOL) 325 MG tablet Take 650 mg by mouth every 6 (six) hours as needed for pain.    Marland Kitchen ALPRAZolam (XANAX) 0.5 MG tablet Take 0.5 mg by mouth at bedtime as needed for sleep.    Marland Kitchen esomeprazole (NEXIUM) 40 MG capsule Take 40 mg by mouth daily before breakfast.    . gabapentin (NEURONTIN) 300 MG capsule Take 300 mg by mouth at bedtime.     . hydrochlorothiazide (HYDRODIURIL) 25 MG tablet Take 25 mg by mouth daily.    . meclizine (ANTIVERT) 25 MG tablet Take 25 mg by mouth 3 (three) times  daily as needed.    . mirabegron ER (MYRBETRIQ) 25 MG TB24 tablet Take 25 mg by mouth daily.    . Multiple Vitamin (MULTIVITAMIN) tablet Take 1 tablet by mouth daily.    Marland Kitchen Propylene Glycol (SYSTANE BALANCE OP) Apply 1 drop to eye at bedtime. 1 drop in both eyes    . traMADol (ULTRAM) 50 MG tablet Take 50 mg by mouth every 6 (six) hours as needed for pain.    . Travoprost, BAK Free, (TRAVATAN) 0.004 % SOLN ophthalmic solution Place 1 drop into both eyes at bedtime.       No results found for this or any previous visit (from the past 48 hour(s)). No results found.  ROS  Blood pressure 138/80, pulse 77, temperature 98.1 F (36.7 C), temperature source Oral, resp. rate 22, height 5\' 7"  (1.702 m), weight 164 lb (74.39 kg), SpO2 94 %. Physical Exam  Constitutional: She appears well-developed and well-nourished.  HENT:  Mouth/Throat: Oropharynx is clear and moist.  Eyes: Conjunctivae are normal. No scleral icterus.  Neck: No thyromegaly present.  Cardiovascular: Normal rate, regular rhythm and normal heart sounds.   No murmur heard. Respiratory: Effort normal and breath sounds normal.  GI: Soft. She exhibits no distension and no mass. There is no tenderness.  Musculoskeletal: She exhibits no edema.  Lymphadenopathy:  She has no cervical adenopathy.  Neurological: She is alert.  Skin: Skin is warm and dry.     Assessment/Plan Solid food dysphagia. Chronic GERD. EGD with ED.  REHMAN,NAJEEB U 03/25/2014, 10:29 AM

## 2014-03-25 NOTE — Discharge Instructions (Signed)
Resume usual medications. Soft foods today and tomorrow; and usual diet. No driving for 24 hours. Patient will call with biopsy results. Weight check in 4 weeks.Esophagogastroduodenoscopy Care After Refer to this sheet in the next few weeks. These instructions provide you with information on caring for yourself after your procedure. Your caregiver may also give you more specific instructions. Your treatment has been planned according to current medical practices, but problems sometimes occur. Call your caregiver if you have any problems or questions after your procedure.  HOME CARE INSTRUCTIONS  Do not eat or drink anything until the numbing medicine (local anesthetic) has worn off and your gag reflex has returned. You will know that the local anesthetic has worn off when you can swallow comfortably.  Do not drive for 12 hours after the procedure or as directed by your caregiver.  Only take medicines as directed by your caregiver. SEEK MEDICAL CARE IF:   You cannot stop coughing.  You are not urinating at all or less than usual. SEEK IMMEDIATE MEDICAL CARE IF:  You have difficulty swallowing.  You cannot eat or drink.  You have worsening throat or chest pain.  You have dizziness, lightheadedness, or you faint.  You have nausea or vomiting.  You have chills.  You have a fever.  You have severe abdominal pain.  You have black, tarry, or bloody stools. Document Released: 04/22/2012 Document Reviewed: 04/22/2012 [facility] Patient Information 2015 [place]. This information is not intended to replace advice given to you by your health care provider. Make sure you discuss any questions you have with your health care provider.

## 2014-03-25 NOTE — Op Note (Signed)
EGD PROCEDURE REPORT  PATIENT:  Traci Clark  MR#:  [id number] Birthdate:  1931/01/13, 80 y.o., female Endoscopist:  Dr. Rogene Houston, MD Referred By:  Dr. Rory Percy, MD Procedure Date: 03/25/2014  Procedure:   EGD with ED.  Indications:  Patient is 80 year old Caucasian female who presents with solid food dysphagia. She has chronic GERD and heartburns well controlled with therapy. She has undergone esophageal dilations in the past.            Informed Consent:  The risks, benefits, alternatives & imponderables which include, but are not limited to, bleeding, infection, perforation, drug reaction and potential missed lesion have been reviewed.  The potential for biopsy, lesion removal, esophageal dilation, etc. have also been discussed.  Questions have been answered.  All parties agreeable.  Please see history & physical in medical record for more information.  Medications:  Demerol 50 mg IV Versed 5 mg IV Cetacaine spray topically for oropharyngeal anesthesia  Description of procedure:  The endoscope was introduced through the mouth and advanced to the second portion of the duodenum without difficulty or limitations. The mucosal surfaces were surveyed very carefully during advancement of the scope and upon withdrawal.  Findings:  Esophagus:  Mucosa of the esophagus was normal. three islands of salmon colored mucosa noted at GE junction. GEJ:  40cm Stomach:  Stomach was empty and distended very well with insufflation. Folds in the proximal stomach were normal. 4 small hyperplastic appearing polyps are noted at gastric body and these were left alone. Antral mucosa was normal. Pyloric channel was spastic. Scope passed across it with some resistance. Angularis fundus and cardia were normal. Duodenum:  Normal bulbar and post bulbar mucosa.  Therapeutic/Diagnostic Maneuvers Performed:   Esophagus dilated by passing 42 French Maloney dilator to full insertion. As the dilator was  withdrawn endoscope was passed again and mucosal disruption noted to mucosa at cervical esophagus and small linear erosion noted to mucosa just proximal to GE junction suggestive of subtle narrowing not apparent on initial exam. Biopsy was taken from mucosa at GE junction and endoscope was withdrawn. Complications:  None  Impression: Three small islands of salmon colored mucosa at GE junction. Biopsy taken from these areas post dilation. No obvious narrowing or ring noted to esophagus. Spastic pyloric channel but no evidence of peptic ulcer disease. Esophageal dilation performed by passing 56 French Maloney dilator resulting in mucosal disruption at proximal esophagus indicative of a web and small linear disruption at distal esophagus suggestive of subtle narrowing.  Recommendations:  Standard instructions given. Soft foods for two days and then usual diet I will be contacting patient with biopsy results. Will plan to have her stop by the office in 4 weeks for recheck and office visit in 3 months.  Lealand Elting U  03/25/2014  11:07 AM  CC: Dr. Rory Percy, MD & Dr. Rayne Du ref. provider found

## 2014-03-30 ENCOUNTER — Encounter (HOSPITAL_COMMUNITY): Payer: Self-pay | Admitting: Internal Medicine

## 2014-03-30 ENCOUNTER — Telehealth (INDEPENDENT_AMBULATORY_CARE_PROVIDER_SITE_OTHER): Payer: Self-pay | Admitting: *Deleted

## 2014-03-30 NOTE — Telephone Encounter (Signed)
Dr.Rehman stated on 03/28/14, the patient would need to come in for a weight check in 4 weeks. This is 04/25/14. I called and spoke with the patient's grandson, he stated that he will tell the patient.

## 2014-04-11 ENCOUNTER — Encounter (INDEPENDENT_AMBULATORY_CARE_PROVIDER_SITE_OTHER): Payer: Self-pay | Admitting: *Deleted

## 2014-04-18 ENCOUNTER — Encounter (INDEPENDENT_AMBULATORY_CARE_PROVIDER_SITE_OTHER): Payer: Self-pay

## 2014-06-27 ENCOUNTER — Encounter (INDEPENDENT_AMBULATORY_CARE_PROVIDER_SITE_OTHER): Payer: Self-pay | Admitting: Internal Medicine

## 2014-06-27 ENCOUNTER — Ambulatory Visit (INDEPENDENT_AMBULATORY_CARE_PROVIDER_SITE_OTHER): Payer: PRIVATE HEALTH INSURANCE | Admitting: Internal Medicine

## 2014-06-27 VITALS — BP 144/70 | HR 64 | Temp 97.7°F | Ht 66.0 in | Wt 166.6 lb

## 2014-06-27 DIAGNOSIS — R350 Frequency of micturition: Secondary | ICD-10-CM

## 2014-06-27 DIAGNOSIS — R1314 Dysphagia, pharyngoesophageal phase: Secondary | ICD-10-CM | POA: Insufficient documentation

## 2014-06-27 MED ORDER — CIPROFLOXACIN HCL 500 MG PO TABS: 500.0000 mg | ORAL_TABLET | Freq: Two times a day (BID) | ORAL | Status: DC

## 2014-06-27 NOTE — Progress Notes (Signed)
Subjective:    Patient ID: Traci Clark, female    DOB: Sep 20, 1930, 81 y.o.   MRN: [id number]  HPI Here today for f/u after recently undergoing an EGD/Ed for solid food dysphagia.  She denies dysphagia. Her appetite is better. GERD controlled with Nexium There has been no weight loss. She can eat anything she wants. She says she has pain when she voids. Pain started Friday.  She says she has urinary incontinence. She has urinary frequency. Her BMs are normal. No melena or BRRB. She has a BM every 3 days. Does use laxative on occasion.  Hx of colon cancer. Underwent a rt colectomy in 2003 by Dr. Lindalou Hose. Her last colonoscopy was in 2014 and was normal.    Procedure: 03/25/2014:  EGD with ED. Indications: Patient is 81 year old Caucasian female who presents with solid food dysphagia. She has chronic GERD and heartburns well controlled with therapy. She has undergone esophageal dilations in the past. Three small islands of salmon colored mucosa at GE junction. Biopsy taken from these areas post dilation. No obvious narrowing or ring noted to esophagus. Spastic pyloric channel but no evidence of peptic ulcer disease. Esophageal dilation performed by passing 56 French Maloney dilator resulting in mucosal disruption at proximal esophagus indicative of a web and small linear disruption at distal esophagus suggestive of subtle narrowing.  Biopsy reveals changes of reflux esophagitis   07/29/2012  Colonoscopy Indications: Patient is 81 year old [place] American female with history of colon carcinoma. She is status post right, colectomy in 2003. She is here for surveillance colonoscopy.  Findings:  Prep excellent Normal mucosa of transverse, descending, sigmoid colon and rectum. Unremarkable anorectal junction.    Review of Systems Past Medical History  Diagnosis Date  . Colon  cancer   . DVT (deep vein thrombosis) in pregnancy   . MI (myocardial infarction)   . GERD (gastroesophageal reflux disease)   . H/O hiatal hernia   . Glaucoma     Past Surgical History  Procedure Laterality Date  . Cholecystectomy    . Tubal ligation    . Vein surgery      stripped vein due to DVT  . Colonoscopy N/A 07/29/2012    Procedure: COLONOSCOPY;  Surgeon: Rogene Houston, MD;  Location: [facility];  Service: Endoscopy;  Laterality: N/A;  100  . Esophagogastroduodenoscopy N/A 03/25/2014    Procedure: ESOPHAGOGASTRODUODENOSCOPY (EGD);  Surgeon: Rogene Houston, MD;  Location: [facility];  Service: Endoscopy;  Laterality: N/A;  1055  . Maloney dilation N/A 03/25/2014    Procedure: Venia Minks DILATION;  Surgeon: Rogene Houston, MD;  Location: [facility];  Service: Endoscopy;  Laterality: N/A;    Allergies  Allergen Reactions  . Codeine     Current Outpatient Prescriptions on File Prior to Visit  Medication Sig Dispense Refill  . acetaminophen (TYLENOL) 325 MG tablet Take 650 mg by mouth every 6 (six) hours as needed for pain.    Marland Kitchen ALPRAZolam (XANAX) 0.5 MG tablet Take 0.5 mg by mouth at bedtime as needed for sleep.    Marland Kitchen esomeprazole (NEXIUM) 40 MG capsule Take 40 mg by mouth daily before breakfast.    . gabapentin (NEURONTIN) 300 MG capsule Take 300 mg by mouth at bedtime.     . hydrochlorothiazide (HYDRODIURIL) 25 MG tablet Take 25 mg by mouth daily.    . meclizine (ANTIVERT) 25 MG tablet Take 25 mg by mouth 3 (three) times daily as needed.    . mirabegron  ER (MYRBETRIQ) 25 MG TB24 tablet Take 25 mg by mouth daily.    . Multiple Vitamin (MULTIVITAMIN) tablet Take 1 tablet by mouth daily.    Marland Kitchen Propylene Glycol (SYSTANE BALANCE OP) Apply 1 drop to eye at bedtime. 1 drop in both eyes    . traMADol (ULTRAM) 50 MG tablet Take 50 mg by mouth every 6 (six) hours as needed for pain.    . Travoprost, BAK Free, (TRAVATAN) 0.004 % SOLN ophthalmic solution Place 1 drop into both  eyes at bedtime.      No current facility-administered medications on file prior to visit.        Objective:   Physical Exam Filed Vitals:   06/27/14 1025  Height: 5\' 6"  (1.676 m)  Weight: 166 lb 9.6 oz (75.569 kg)   Alert and oriented. Skin warm and dry. Oral mucosa is moist.   . Sclera anicteric, conjunctivae is pink. Thyroid not enlarged. No cervical lymphadenopathy. Lungs clear. Heart regular rate and rhythm.  Abdomen is soft. Bowel sounds are positive. No hepatomegaly. No abdominal masses felt. No tenderness.  No edema to lower extremities.         Assessment & Plan:  Solid foods dysphagia. No problems with dysphagia at this time.  GERD controlled with Nexium. UTI: Will get a UA on her and cover her with Cipro 500mg  BID x 7 days.

## 2014-06-27 NOTE — Patient Instructions (Signed)
Cipro 250mg  BID x 7 days

## 2014-06-28 LAB — URINALYSIS
Bilirubin Urine: NEGATIVE
Glucose, UA: NEGATIVE mg/dL
Ketones, ur: NEGATIVE mg/dL
Nitrite: NEGATIVE
Protein, ur: 30 mg/dL — AB
Specific Gravity, Urine: 1.008 (ref 1.005–1.030)
Urobilinogen, UA: 0.2 mg/dL (ref 0.0–1.0)
pH: 7.5 (ref 5.0–8.0)

## 2015-03-30 ENCOUNTER — Encounter (INDEPENDENT_AMBULATORY_CARE_PROVIDER_SITE_OTHER): Payer: Self-pay | Admitting: *Deleted

## 2015-05-23 DIAGNOSIS — Z1231 Encounter for screening mammogram for malignant neoplasm of breast: Secondary | ICD-10-CM | POA: Diagnosis not present

## 2015-07-03 ENCOUNTER — Ambulatory Visit (INDEPENDENT_AMBULATORY_CARE_PROVIDER_SITE_OTHER): Payer: Medicare Other | Admitting: Internal Medicine

## 2015-07-06 DIAGNOSIS — H401132 Primary open-angle glaucoma, bilateral, moderate stage: Secondary | ICD-10-CM | POA: Diagnosis not present

## 2015-07-06 DIAGNOSIS — H353131 Nonexudative age-related macular degeneration, bilateral, early dry stage: Secondary | ICD-10-CM | POA: Diagnosis not present

## 2015-07-15 DIAGNOSIS — J069 Acute upper respiratory infection, unspecified: Secondary | ICD-10-CM | POA: Diagnosis not present

## 2015-07-15 DIAGNOSIS — R05 Cough: Secondary | ICD-10-CM | POA: Diagnosis not present

## 2015-08-01 DIAGNOSIS — M1A00X0 Idiopathic chronic gout, unspecified site, without tophus (tophi): Secondary | ICD-10-CM | POA: Diagnosis not present

## 2015-08-11 DIAGNOSIS — E119 Type 2 diabetes mellitus without complications: Secondary | ICD-10-CM | POA: Diagnosis not present

## 2015-08-11 DIAGNOSIS — R5383 Other fatigue: Secondary | ICD-10-CM | POA: Diagnosis not present

## 2015-08-11 DIAGNOSIS — R05 Cough: Secondary | ICD-10-CM | POA: Diagnosis not present

## 2015-08-21 DIAGNOSIS — E119 Type 2 diabetes mellitus without complications: Secondary | ICD-10-CM | POA: Diagnosis not present

## 2015-08-21 DIAGNOSIS — Z888 Allergy status to other drugs, medicaments and biological substances status: Secondary | ICD-10-CM | POA: Diagnosis not present

## 2015-08-21 DIAGNOSIS — R079 Chest pain, unspecified: Secondary | ICD-10-CM | POA: Diagnosis not present

## 2015-08-21 DIAGNOSIS — R262 Difficulty in walking, not elsewhere classified: Secondary | ICD-10-CM | POA: Diagnosis not present

## 2015-08-21 DIAGNOSIS — I10 Essential (primary) hypertension: Secondary | ICD-10-CM | POA: Diagnosis not present

## 2015-08-21 DIAGNOSIS — R112 Nausea with vomiting, unspecified: Secondary | ICD-10-CM | POA: Diagnosis not present

## 2015-08-21 DIAGNOSIS — I252 Old myocardial infarction: Secondary | ICD-10-CM | POA: Diagnosis not present

## 2015-08-21 DIAGNOSIS — R0602 Shortness of breath: Secondary | ICD-10-CM | POA: Diagnosis not present

## 2015-08-21 DIAGNOSIS — R072 Precordial pain: Secondary | ICD-10-CM | POA: Diagnosis not present

## 2015-08-21 DIAGNOSIS — Z794 Long term (current) use of insulin: Secondary | ICD-10-CM | POA: Diagnosis not present

## 2015-08-22 DIAGNOSIS — R079 Chest pain, unspecified: Secondary | ICD-10-CM | POA: Diagnosis not present

## 2015-08-23 DIAGNOSIS — I10 Essential (primary) hypertension: Secondary | ICD-10-CM | POA: Diagnosis not present

## 2015-08-23 DIAGNOSIS — E785 Hyperlipidemia, unspecified: Secondary | ICD-10-CM | POA: Diagnosis not present

## 2015-08-23 DIAGNOSIS — R079 Chest pain, unspecified: Secondary | ICD-10-CM | POA: Diagnosis not present

## 2015-08-23 DIAGNOSIS — E119 Type 2 diabetes mellitus without complications: Secondary | ICD-10-CM | POA: Diagnosis not present

## 2015-08-30 DIAGNOSIS — R0600 Dyspnea, unspecified: Secondary | ICD-10-CM | POA: Diagnosis not present

## 2015-09-22 DIAGNOSIS — I10 Essential (primary) hypertension: Secondary | ICD-10-CM | POA: Diagnosis not present

## 2015-09-22 DIAGNOSIS — R0600 Dyspnea, unspecified: Secondary | ICD-10-CM | POA: Diagnosis not present

## 2015-09-22 DIAGNOSIS — I2510 Atherosclerotic heart disease of native coronary artery without angina pectoris: Secondary | ICD-10-CM | POA: Diagnosis not present

## 2015-09-22 DIAGNOSIS — R079 Chest pain, unspecified: Secondary | ICD-10-CM | POA: Diagnosis not present

## 2015-09-27 DIAGNOSIS — E039 Hypothyroidism, unspecified: Secondary | ICD-10-CM | POA: Diagnosis not present

## 2015-10-12 DIAGNOSIS — M1A00X0 Idiopathic chronic gout, unspecified site, without tophus (tophi): Secondary | ICD-10-CM | POA: Diagnosis not present

## 2015-10-12 DIAGNOSIS — N390 Urinary tract infection, site not specified: Secondary | ICD-10-CM | POA: Diagnosis not present

## 2015-10-18 DIAGNOSIS — R110 Nausea: Secondary | ICD-10-CM | POA: Diagnosis not present

## 2015-10-18 DIAGNOSIS — N390 Urinary tract infection, site not specified: Secondary | ICD-10-CM | POA: Diagnosis not present

## 2015-10-23 DIAGNOSIS — E1165 Type 2 diabetes mellitus with hyperglycemia: Secondary | ICD-10-CM | POA: Diagnosis not present

## 2015-10-23 DIAGNOSIS — K219 Gastro-esophageal reflux disease without esophagitis: Secondary | ICD-10-CM | POA: Diagnosis not present

## 2015-10-23 DIAGNOSIS — I10 Essential (primary) hypertension: Secondary | ICD-10-CM | POA: Diagnosis not present

## 2015-10-23 DIAGNOSIS — E782 Mixed hyperlipidemia: Secondary | ICD-10-CM | POA: Diagnosis not present

## 2015-10-23 DIAGNOSIS — R739 Hyperglycemia, unspecified: Secondary | ICD-10-CM | POA: Diagnosis not present

## 2015-10-26 DIAGNOSIS — E1140 Type 2 diabetes mellitus with diabetic neuropathy, unspecified: Secondary | ICD-10-CM | POA: Diagnosis not present

## 2015-10-26 DIAGNOSIS — B370 Candidal stomatitis: Secondary | ICD-10-CM | POA: Diagnosis not present

## 2015-10-26 DIAGNOSIS — G2581 Restless legs syndrome: Secondary | ICD-10-CM | POA: Diagnosis not present

## 2015-10-26 DIAGNOSIS — K219 Gastro-esophageal reflux disease without esophagitis: Secondary | ICD-10-CM | POA: Diagnosis not present

## 2015-10-26 DIAGNOSIS — I10 Essential (primary) hypertension: Secondary | ICD-10-CM | POA: Diagnosis not present

## 2015-10-26 DIAGNOSIS — E1165 Type 2 diabetes mellitus with hyperglycemia: Secondary | ICD-10-CM | POA: Diagnosis not present

## 2015-10-26 DIAGNOSIS — R1084 Generalized abdominal pain: Secondary | ICD-10-CM | POA: Diagnosis not present

## 2015-10-26 DIAGNOSIS — E039 Hypothyroidism, unspecified: Secondary | ICD-10-CM | POA: Diagnosis not present

## 2015-10-30 ENCOUNTER — Encounter (INDEPENDENT_AMBULATORY_CARE_PROVIDER_SITE_OTHER): Payer: Self-pay | Admitting: Internal Medicine

## 2015-10-30 ENCOUNTER — Encounter (INDEPENDENT_AMBULATORY_CARE_PROVIDER_SITE_OTHER): Payer: Self-pay

## 2015-10-30 ENCOUNTER — Other Ambulatory Visit (INDEPENDENT_AMBULATORY_CARE_PROVIDER_SITE_OTHER): Payer: Self-pay | Admitting: Internal Medicine

## 2015-10-30 ENCOUNTER — Encounter (INDEPENDENT_AMBULATORY_CARE_PROVIDER_SITE_OTHER): Payer: Self-pay | Admitting: *Deleted

## 2015-10-30 ENCOUNTER — Ambulatory Visit (INDEPENDENT_AMBULATORY_CARE_PROVIDER_SITE_OTHER): Payer: Medicare HMO | Admitting: Internal Medicine

## 2015-10-30 VITALS — BP 120/68 | HR 56 | Temp 97.5°F | Ht 66.0 in | Wt 162.3 lb

## 2015-10-30 DIAGNOSIS — R112 Nausea with vomiting, unspecified: Secondary | ICD-10-CM

## 2015-10-30 DIAGNOSIS — R110 Nausea: Secondary | ICD-10-CM

## 2015-10-30 NOTE — Progress Notes (Signed)
Subjective:    Patient ID: Traci Clark, female    DOB: 27-Aug-1930, 82 y.o.   MRN: [id number]   HPI Referred by Rory Percy for nausea/EGD. Marland Kitchen Hx of colon cancer. Underwent a rt colectomy in 2003 by Dr. Lindalou Hose. Her last colonoscopy was in 2014 and was normal. She tells me had some irritation in her upper abdomen. She says when she becomes hot, she vomits green/yellowish vomitus. Sometimes her left flank is painful when she sweeps or mops. She becomes hot and nauseated. She says she was having trouble swallowing pills last week. She says she is not having any problems this week. No problems with meats.  She says she had a reaction to ?antibiotic for a kidney infection. Her appetite is not good. She has lost about 3 pounds since I saw her in February. Denies any acid reflux.  She usually has a BM every 3 days. No melena or BRRB.  Her next colonoscopy will be in 2018.   6/6/20117: ALP 121, AST 27, ALT 22  Procedure: 03/25/2014:    EGD with ED. Indications:  Patient is 82 year old Caucasian female who presents with solid food dysphagia. She has chronic GERD and heartburns well controlled with therapy. She has undergone esophageal dilations in the past.                                                                                                               Three small islands of salmon colored mucosa at GE junction. Biopsy taken from these areas post dilation. No obvious narrowing or ring noted to esophagus. Spastic pyloric channel but no evidence of peptic ulcer disease. Esophageal dilation performed by passing 56 French Maloney dilator resulting in mucosal disruption at proximal esophagus indicative of a web and small linear disruption at distal esophagus suggestive of subtle narrowing.  Biopsy reveals changes of reflux esophagitis   07/29/2012  Colonoscopy Indications:  Patient is 82 year old [place] American female with history of colon carcinoma. She is status post right, colectomy  in 2003. She is here for surveillance colonoscopy.   Findings:   Prep excellent Normal mucosa of transverse, descending, sigmoid colon and rectum. Unremarkable anorectal junction.    Review of Systems Past Medical History  Diagnosis Date  . Colon cancer ([place])   . DVT (deep vein thrombosis) in pregnancy   . MI (myocardial infarction) ([place])   . GERD (gastroesophageal reflux disease)   . H/O hiatal hernia   . Glaucoma     Past Surgical History  Procedure Laterality Date  . Cholecystectomy    . Tubal ligation    . Vein surgery      stripped vein due to DVT  . Colonoscopy N/A 07/29/2012    Procedure: COLONOSCOPY;  Surgeon: Rogene Houston, MD;  [facility];  Service: Endoscopy;  Laterality: N/A;  100  . Esophagogastroduodenoscopy N/A 03/25/2014    Procedure: ESOPHAGOGASTRODUODENOSCOPY (EGD);  Surgeon: Rogene Houston, MD;  [facility];  Service: Endoscopy;  Laterality: N/A;  1055  .  Maloney dilation N/A 03/25/2014    Procedure: Venia Minks DILATION;  Surgeon: Rogene Houston, MD;  [facility];  Service: Endoscopy;  Laterality: N/A;    Allergies  Allergen Reactions  . Codeine   . Darvon [Propoxyphene]     Current Outpatient Prescriptions on File Prior to Visit  Medication Sig Dispense Refill  . acetaminophen (TYLENOL) 325 MG tablet Take 650 mg by mouth every 6 (six) hours as needed for pain.    Marland Kitchen ALPRAZolam (XANAX) 0.5 MG tablet Take 0.5 mg by mouth at bedtime as needed for sleep.    Marland Kitchen esomeprazole (NEXIUM) 40 MG capsule Take 40 mg by mouth daily before breakfast.    . gabapentin (NEURONTIN) 300 MG capsule Take 300 mg by mouth at bedtime.     . hydrochlorothiazide (HYDRODIURIL) 25 MG tablet Take 25 mg by mouth daily.    . meclizine (ANTIVERT) 25 MG tablet Take 25 mg by mouth 3 (three) times daily as needed.    . Travoprost, BAK Free, (TRAVATAN) 0.004 % SOLN ophthalmic solution Place 1 drop into both eyes at bedtime.      No current  facility-administered medications on file prior to visit.        Objective:   Physical Exam Blood pressure 120/68, pulse 56, temperature 97.5 F (36.4 C), height 5\' 6"  (1.676 m), weight 162 lb 4.8 oz (73.619 kg). Alert and oriented. Skin warm and dry. Oral mucosa is moist.   . Sclera anicteric, conjunctivae is pink. Thyroid not enlarged. No cervical lymphadenopathy. Lungs clear. Heart regular rate and rhythm.  Abdomen is soft. Bowel sounds are positive. No hepatomegaly. No abdominal masses felt. No tenderness.  No edema to lower extremities.          Assessment & Plan:  Patient is requesting an EGD today for her nausea. No dysphagia this weeks.  EGD. The risks and benefits such as perforation, bleeding, and infection were reviewed with the patient and is agreeable.

## 2015-10-30 NOTE — Patient Instructions (Addendum)
EGD. The risks and benefits such as perforation, bleeding, and infection were reviewed with the patient and is agreeable. 

## 2015-11-03 ENCOUNTER — Encounter (HOSPITAL_COMMUNITY)
Admission: RE | Disposition: A | Discharge status: Home / Self Care | Payer: Self-pay | Source: Ambulatory Visit | Attending: Internal Medicine

## 2015-11-03 ENCOUNTER — Ambulatory Visit (HOSPITAL_COMMUNITY)
Admission: RE | Admit: 2015-11-03 | Discharge: 2015-11-03 | Disposition: A | Discharge status: Home / Self Care | Payer: Medicare HMO | Source: Ambulatory Visit | Attending: Internal Medicine | Admitting: Internal Medicine

## 2015-11-03 ENCOUNTER — Encounter (HOSPITAL_COMMUNITY): Payer: Self-pay | Admitting: *Deleted

## 2015-11-03 DIAGNOSIS — I252 Old myocardial infarction: Secondary | ICD-10-CM | POA: Insufficient documentation

## 2015-11-03 DIAGNOSIS — R1314 Dysphagia, pharyngoesophageal phase: Secondary | ICD-10-CM | POA: Diagnosis not present

## 2015-11-03 DIAGNOSIS — Q394 Esophageal web: Secondary | ICD-10-CM | POA: Insufficient documentation

## 2015-11-03 DIAGNOSIS — K228 Other specified diseases of esophagus: Secondary | ICD-10-CM | POA: Insufficient documentation

## 2015-11-03 DIAGNOSIS — K219 Gastro-esophageal reflux disease without esophagitis: Secondary | ICD-10-CM | POA: Insufficient documentation

## 2015-11-03 DIAGNOSIS — K317 Polyp of stomach and duodenum: Secondary | ICD-10-CM | POA: Insufficient documentation

## 2015-11-03 DIAGNOSIS — K222 Esophageal obstruction: Secondary | ICD-10-CM | POA: Diagnosis not present

## 2015-11-03 DIAGNOSIS — Z79899 Other long term (current) drug therapy: Secondary | ICD-10-CM | POA: Diagnosis not present

## 2015-11-03 DIAGNOSIS — R1319 Other dysphagia: Secondary | ICD-10-CM | POA: Diagnosis not present

## 2015-11-03 DIAGNOSIS — B3781 Candidal esophagitis: Secondary | ICD-10-CM | POA: Diagnosis not present

## 2015-11-03 DIAGNOSIS — R1012 Left upper quadrant pain: Secondary | ICD-10-CM | POA: Diagnosis not present

## 2015-11-03 DIAGNOSIS — R110 Nausea: Secondary | ICD-10-CM

## 2015-11-03 DIAGNOSIS — Z794 Long term (current) use of insulin: Secondary | ICD-10-CM | POA: Insufficient documentation

## 2015-11-03 DIAGNOSIS — Z86718 Personal history of other venous thrombosis and embolism: Secondary | ICD-10-CM | POA: Diagnosis not present

## 2015-11-03 DIAGNOSIS — R112 Nausea with vomiting, unspecified: Secondary | ICD-10-CM

## 2015-11-03 DIAGNOSIS — Z85038 Personal history of other malignant neoplasm of large intestine: Secondary | ICD-10-CM | POA: Diagnosis not present

## 2015-11-03 HISTORY — PX: ESOPHAGEAL DILATION: SHX303

## 2015-11-03 HISTORY — PX: ESOPHAGOGASTRODUODENOSCOPY: SHX5428

## 2015-11-03 LAB — KOH PREP

## 2015-11-03 LAB — GLUCOSE, CAPILLARY: GLUCOSE-CAPILLARY: 124 mg/dL — AB (ref 65–99)

## 2015-11-03 SURGERY — EGD (ESOPHAGOGASTRODUODENOSCOPY)
Anesthesia: Moderate Sedation

## 2015-11-03 MED ORDER — ONDANSETRON HCL 4 MG/2ML IJ SOLN
INTRAMUSCULAR | Status: AC
  Filled 2015-11-03: qty 2

## 2015-11-03 MED ORDER — MIDAZOLAM HCL 5 MG/5ML IJ SOLN
INTRAMUSCULAR | Status: DC | PRN
  Administered 2015-11-03: 1 mg via INTRAVENOUS
  Administered 2015-11-03: 2 mg via INTRAVENOUS

## 2015-11-03 MED ORDER — BUTAMBEN-TETRACAINE-BENZOCAINE 2-2-14 % EX AERO
INHALATION_SPRAY | CUTANEOUS | Status: DC | PRN
  Administered 2015-11-03: 2 via TOPICAL

## 2015-11-03 MED ORDER — MEPERIDINE HCL 50 MG/ML IJ SOLN
INTRAMUSCULAR | Status: DC | PRN
  Administered 2015-11-03 (×2): 25 mg via INTRAVENOUS

## 2015-11-03 MED ORDER — MIDAZOLAM HCL 5 MG/5ML IJ SOLN
INTRAMUSCULAR | Status: AC
  Filled 2015-11-03: qty 10

## 2015-11-03 MED ORDER — MEPERIDINE HCL 50 MG/ML IJ SOLN
INTRAMUSCULAR | Status: AC
  Filled 2015-11-03: qty 1

## 2015-11-03 MED ORDER — SODIUM CHLORIDE 0.9 % IV SOLN
INTRAVENOUS | Status: DC
  Administered 2015-11-03: 13:00:00 via INTRAVENOUS

## 2015-11-03 MED ORDER — ONDANSETRON HCL 4 MG/2ML IJ SOLN
4.0000 mg | Freq: Once | INTRAMUSCULAR | Status: AC
  Administered 2015-11-03: 4 mg via INTRAVENOUS

## 2015-11-03 MED ORDER — NYSTATIN 100000 UNIT/ML MT SUSP: 5.0000 mL | Freq: Four times a day (QID) | OROMUCOSAL | Status: DC

## 2015-11-03 NOTE — Op Note (Signed)
[facility] Patient Name: Traci Clark Procedure Date: 11/03/2015 2:17 PM MRN: [id number] Date of Birth: [birth year] Attending MD: Hildred Laser , MD CSN: [id number] Age: 82 Admit Type: Outpatient Procedure:                Upper GI endoscopy Indications:              Abdominal pain in the left upper quadrant,                            Esophageal dysphagia, Nausea with vomiting Providers:                Hildred Laser, MD, Lurline Del, RN, Georgeann Oppenheim,                            Technician Referring MD:             Rory Percy, MD Medicines:                Cetacaine spray, Meperidine 50 mg IV, Midazolam 3                            mg IV Complications:            No immediate complications. Estimated Blood Loss:     Estimated blood loss was minimal. Procedure:                Pre-Anesthesia Assessment:                           - Prior to the procedure, a History and Physical                            was performed, and patient medications and                            allergies were reviewed. The patient's tolerance of                            previous anesthesia was also reviewed. The risks                            and benefits of the procedure and the sedation                            options and risks were discussed with the patient.                            All questions were answered, and informed consent                            was obtained. Prior Anticoagulants: The patient                            last took aspirin 2 days prior to the procedure.  ASA Grade Assessment: II - A patient with mild                            systemic disease. After reviewing the risks and                            benefits, the patient was deemed in satisfactory                            condition to undergo the procedure.                           After obtaining informed consent, the endoscope was                            passed under direct  vision. Throughout the                            procedure, the patient's blood pressure, pulse, and                            oxygen saturations were monitored continuously. The                            [id number]) scope was introduced through the                            mouth, and advanced to the second part of duodenum.                            The upper GI endoscopy was accomplished without                            difficulty. The patient tolerated the procedure                            well. Scope In: 2:28:06 PM Scope Out: 2:38:03 PM Total Procedure Duration: 0 hours 9 minutes 57 seconds  Findings:      One mild benign-appearing, intrinsic stenosis was found 40 cm from the       incisors. The scope was withdrawn. Dilation was performed with a Maloney       dilator with mild resistance at 9 Fr. The dilation site was examined       and showed moderate improvement in luminal narrowing. a causal disruion       noted.      Plaques were found in the middle third of the esophagus. Cells for       cytology were obtained by brushing. taken for KOH prep.      A few small sessile polyps with no stigmata of recent bleeding were       found in the gastric body.      The exam of the stomach was otherwise normal.      The duodenal bulb and second portion of the duodenum were normal. Impression:               -  Benign-appearing esophageal stenosis. Dilated.                           - Esophageal dilation also resulted in shortening                            mucosal disruption at proximal esophagus indicative                            of disrupted esophageal web.                           - Whistis laques in the middle third of the                            esophagus consistent with mild Candida esophagitis.                            Brushing taken for KOH prep. obtained.                           - Three small hyperplastic appearing gastric                             polyps.these polyps were left alone.                           - Normal duodenal bulb and second portion of the                            duodenum. Moderate Sedation:      Moderate (conscious) sedation was administered by the endoscopy nurse       and supervised by the endoscopist. The following parameters were       monitored: oxygen saturation, heart rate, blood pressure, CO2       capnography and response to care. Total physician intraservice time was       19 minutes. Recommendation:           - Patient has a contact number available for                            emergencies. The signs and symptoms of potential                            delayed complications were discussed with the                            patient. Return to normal activities tomorrow.                            Written discharge instructions were provided to the                            patient.                           -  Patient has a contact number available for                            emergencies. The signs and symptoms of potential                            delayed complications were discussed with the                            patient. Return to normal activities tomorrow.                            Written discharge instructions were provided to the                            patient.                           - Resume previous diet today.                           - Continue present medications.                           - Resume aspirin at prior dose tomorrow.                           - Nystatin suspension 500,000 units PO QID for 10                            days.                           - Perform CT scan (computed tomography) of the                            abdomen and pelvis with contrast. Procedure Code(s):        --- Professional ---                           [phone], Esophagogastroduodenoscopy, flexible,                            transoral; diagnostic, including collection of                             specimen(s) by brushing or washing, when performed                            (separate procedure)                           43450, Dilation of esophagus, by unguided sound or                            bougie, single or multiple passes  [id number], Moderate sedation services provided by the                            same physician or other qualified health care                            professional performing the diagnostic or                            therapeutic service that the sedation supports,                            requiring the presence of an independent trained                            observer to assist in the monitoring of the                            patient's level of consciousness and physiological                            status; initial 15 minutes of intraservice time,                            patient age 53 years or older Diagnosis Code(s):        --- Professional ---                           K22.2, Esophageal obstruction                           K22.8, Other specified diseases of esophagus                           K31.7, Polyp of stomach and duodenum                           R10.12, Left upper quadrant pain                           R13.14, Dysphagia, pharyngoesophageal phase                           R11.2, Nausea with vomiting, unspecified CPT copyright 2016 American Medical Association. All rights reserved. The codes documented in this report are preliminary and upon coder review may  be revised to meet current compliance requirements. Hildred Laser, MD Hildred Laser, MD 11/03/2015 2:57:41 PM This report has been signed electronically. Number of Addenda: 0

## 2015-11-03 NOTE — H&P (Signed)
Traci Clark is an 82 y.o. female.   Chief Complaint: Patient is here for EGD and ED. HPI: Patient is 82 year old African-American female who presents with three-month history of intermittent nausea and vomiting and she also complains of pain in left costal margin. She's generally vomits bilious material. She also complains of difficulty swallowing pills. She states sometimes she has to regurgitate them. She had has difficulty with solids occasionally. She states her esophagus was dilated and a half ago she had no difficulty in 3 months ago. She has not lost any weight recently. She denies melena or rectal bleeding. She had LFTs by Dr. Rory Percy in March and again 2 weeks ago and these are normal except for borderline alkaline phosphatase. She has had cholecystectomy several years ago. She recalls she had similar symptoms except pain was on the right side.  Past Medical History  Diagnosis Date  . Colon cancer ([place])   . DVT (deep vein thrombosis) in pregnancy   . MI (myocardial infarction) ([place])   . GERD (gastroesophageal reflux disease)   . H/O hiatal hernia   . Glaucoma     Past Surgical History  Procedure Laterality Date  . Cholecystectomy    . Tubal ligation    . Vein surgery      stripped vein due to DVT  . Colonoscopy N/A 07/29/2012    Procedure: COLONOSCOPY;  Surgeon: Rogene Houston, MD;  [facility];  Service: Endoscopy;  Laterality: N/A;  100  . Esophagogastroduodenoscopy N/A 03/25/2014    Procedure: ESOPHAGOGASTRODUODENOSCOPY (EGD);  Surgeon: Rogene Houston, MD;  [facility];  Service: Endoscopy;  Laterality: N/A;  1055  . Maloney dilation N/A 03/25/2014    Procedure: Venia Minks DILATION;  Surgeon: Rogene Houston, MD;  [facility];  Service: Endoscopy;  Laterality: N/A;    History reviewed. No pertinent family history. Social History:  reports that she has never smoked. She has never used smokeless tobacco. She reports that she does not  drink alcohol or use illicit drugs.  Allergies:  Allergies  Allergen Reactions  . Amoxicillin Anaphylaxis and Rash    "Skin peeled off"  . Macrobid [Nitrofurantoin Monohyd Macro] Anaphylaxis, Rash and Other (See Comments)    "Skin peeled off"  . Codeine Nausea And Vomiting and Other (See Comments)    shaking  . Darvon [Propoxyphene] Nausea And Vomiting and Other (See Comments)    shaking    Medications Prior to Admission  Medication Sig Dispense Refill  . acetaminophen (TYLENOL) 325 MG tablet Take 650 mg by mouth every 6 (six) hours as needed for pain.    Marland Kitchen ALPRAZolam (XANAX) 0.5 MG tablet Take 0.5 mg by mouth at bedtime as needed for sleep.    Marland Kitchen alum & mag hydroxide-simeth (MAALOX/MYLANTA) 200-200-20 MG/5ML suspension Take 30 mLs by mouth daily.    Marland Kitchen aspirin 81 MG tablet Take 81 mg by mouth daily.    . colchicine 0.6 MG tablet Take 0.6 mg by mouth daily.    Marland Kitchen esomeprazole (NEXIUM) 40 MG capsule Take 40 mg by mouth daily before breakfast.    . gabapentin (NEURONTIN) 300 MG capsule Take 300 mg by mouth at bedtime.     . hydrochlorothiazide (HYDRODIURIL) 25 MG tablet Take 25 mg by mouth daily.    . insulin glargine (LANTUS) 100 UNIT/ML injection Inject 14 Units into the skin at bedtime. 14 units at night    . insulin lispro (HUMALOG) 100 UNIT/ML injection Inject 4-10 Units  into the skin 3 (three) times daily before meals. Sliding scale 151- 200=4 units; 201-250= 6 units; 251-300= 8 units; 301-350= 10 units.    . meclizine (ANTIVERT) 25 MG tablet Take 25 mg by mouth 3 (three) times daily as needed for dizziness.     . Multiple Vitamins-Minerals (HAIR SKIN AND NAILS FORMULA) TABS Take 1 tablet by mouth every morning.     . ondansetron (ZOFRAN-ODT) 8 MG disintegrating tablet Take 1 tablet by mouth every 6 (six) hours as needed for nausea or vomiting.   0  . nitrofurantoin, macrocrystal-monohydrate, (MACROBID) 100 MG capsule TK 1 C PO BID FOR INFECTION  0  . Travoprost, BAK Free, (TRAVATAN)  0.004 % SOLN ophthalmic solution Place 1 drop into both eyes at bedtime.       Results for orders placed or performed during the hospital encounter of 11/03/15 (from the past 48 hour(s))  Glucose, capillary     Status: Abnormal   Collection Time: 11/03/15 12:25 PM  Result Value Ref Range   Glucose-Capillary 124 (H) 65 - 99 mg/dL   No results found.  ROS  Blood pressure 172/75, pulse 60, temperature 97.9 F (36.6 C), temperature source Oral, resp. rate 19, height 5\' 6"  (1.676 m), weight 162 lb (73.483 kg), SpO2 98 %. Physical Exam  Constitutional: She appears well-developed and well-nourished.  HENT:  Mouth/Throat: Oropharynx is clear and moist.  Eyes: Conjunctivae are normal. No scleral icterus.  Neck: No thyromegaly present.  Cardiovascular: Normal rate, regular rhythm and normal heart sounds.   No murmur heard. Respiratory: Effort normal and breath sounds normal.  GI:  Abdomen is symmetrical. His soft mild tenderness below the left costal margin. No tenderness noted on the right side in epigastric region. No organomegaly or masses.  Lymphadenopathy:    She has no cervical adenopathy.     Assessment/Plan Nausea vomiting and dysphagia to pills and occasionally to food. EGD and ED.  Hildred Laser, MD 11/03/2015, 2:13 PM

## 2015-11-03 NOTE — Discharge Instructions (Signed)
Resume aspirin on 11/04/2015 Resume other medications and diet as before. Mycostatin suspension 500,000 units swish and swallow 4 times a day until prescription runs out. Will schedule abdominopelvic CT next week. Office will call. Dr Olevia Perches Office [phone]  Nystatin oral suspension What is this medicine? NYSTATIN (nye STAT in) is an antifungal medicine. It is used to treat certain kinds of fungal or yeast infections. This medicine may be used for other purposes; ask your health care provider or pharmacist if you have questions. What should I tell my health care provider before I take this medicine? They need to know if you have any of these conditions: -diabetes -kidney disease -an unusual or allergic reaction to nystatin, ethylenediamine, parabens, thimerosal, other foods, dyes or preservatives -pregnant or trying to get pregnant -breast-feeding How should I use this medicine? Follow the directions on the prescription label. Shake well before using. Use a specially marked dropper to measure every dose. Ask your pharmacist if you do not have one. Put one half of the dose in each side of your mouth. Swish the medicine around in your mouth and gargle. Hold your dose in your mouth for as long as you can. Swallow or spit out as directed by your doctor. Take your medicine at regular intervals. Do not take your medicine more often than directed. Do not skip doses or stop your medicine early even if you feel better. Do not stop taking except on your doctor's advice. Talk to your pediatrician regarding the use of this medicine in children. Special care may be needed. Overdosage: If you think you have taken too much of this medicine contact a poison control center or emergency room at once. NOTE: This medicine is only for you. Do not share this medicine with others. What if I miss a dose? If you miss a dose, take it as soon as you can. If it is almost time for your next dose, take only that  dose. Do not take double or extra doses. What may interact with this medicine? Interactions are not expected. This list may not describe all possible interactions. Give your health care provider a list of all the medicines, herbs, non-prescription drugs, or dietary supplements you use. Also tell them if you smoke, drink alcohol, or use illegal drugs. Some items may interact with your medicine. What should I watch for while using this medicine? Tell your doctor or health care professional if your symptoms do not improve or get worse. If you wear dentures talk to your doctor about how to clean them. What side effects may I notice from receiving this medicine? Side effects that you should report to your doctor or health care professional as soon as possible: -allergic reactions like skin rash, itching or hives, swelling of the face, lips, or tongue -fast heart beat -redness, blistering, peeling or loosening of the skin, including inside the mouth -trouble breathing Side effects that usually do not require medical attention (report to your doctor or health care professional if they continue or are bothersome): -diarrhea -muscle aches or pains -nausea, vomiting -stomach upset This list may not describe all possible side effects. Call your doctor for medical advice about side effects. You may report side effects to FDA at 1-800-FDA-1088. Where should I keep my medicine? Keep out of the reach of children. Store at room temperature between 15 and 25 degrees C (59 and 77 degrees F). Protect from light. Throw away any unused medicine after the expiration date. NOTE: This sheet is a summary.  It may not cover all possible information. If you have questions about this medicine, talk to your doctor, pharmacist, or health care provider.    2016, Elsevier/Gold Standard. (2008-06-07 13:21:00) Esophagogastroduodenoscopy, Care After Refer to this sheet in the next few weeks. These instructions provide you  with information about caring for yourself after your procedure. Your health care provider may also give you more specific instructions. Your treatment has been planned according to current medical practices, but problems sometimes occur. Call your health care provider if you have any problems or questions after your procedure. WHAT TO EXPECT AFTER THE PROCEDURE After your procedure, it is typical to feel:  Soreness in your throat.  Pain with swallowing.  Sick to your stomach (nauseous).  Bloated.  Dizzy.  Fatigued. HOME CARE INSTRUCTIONS  Do not eat or drink anything until the numbing medicine (local anesthetic) has worn off and your gag reflex has returned. You will know that the local anesthetic has worn off when you can swallow comfortably.  Do not drive or operate machinery until directed by your health care provider.  Take medicines only as directed by your health care provider. SEEK MEDICAL CARE IF:   You cannot stop coughing.  You are not urinating at all or less than usual. SEEK IMMEDIATE MEDICAL CARE IF:  You have difficulty swallowing.  You cannot eat or drink.  You have worsening throat or chest pain.  You have dizziness or lightheadedness or you faint.  You have nausea or vomiting.  You have chills.  You have a fever.  You have severe abdominal pain.  You have black, tarry, or bloody stools.   This information is not intended to replace advice given to you by your health care provider. Make sure you discuss any questions you have with your health care provider.   Document Released: 04/22/2012 Document Revised: 05/27/2014 Document Reviewed: 04/22/2012 Elsevier Interactive Patient Education [employer].

## 2015-11-08 ENCOUNTER — Other Ambulatory Visit (INDEPENDENT_AMBULATORY_CARE_PROVIDER_SITE_OTHER): Payer: Self-pay | Admitting: Internal Medicine

## 2015-11-08 DIAGNOSIS — R1012 Left upper quadrant pain: Secondary | ICD-10-CM

## 2015-11-08 DIAGNOSIS — R112 Nausea with vomiting, unspecified: Secondary | ICD-10-CM

## 2015-11-15 DIAGNOSIS — M10072 Idiopathic gout, left ankle and foot: Secondary | ICD-10-CM | POA: Diagnosis not present

## 2015-11-15 DIAGNOSIS — M10071 Idiopathic gout, right ankle and foot: Secondary | ICD-10-CM | POA: Diagnosis not present

## 2015-11-15 DIAGNOSIS — M25579 Pain in unspecified ankle and joints of unspecified foot: Secondary | ICD-10-CM | POA: Diagnosis not present

## 2015-11-15 DIAGNOSIS — M79671 Pain in right foot: Secondary | ICD-10-CM | POA: Diagnosis not present

## 2015-11-16 ENCOUNTER — Ambulatory Visit (HOSPITAL_COMMUNITY)
Admission: RE | Admit: 2015-11-16 | Discharge: 2015-11-16 | Disposition: A | Discharge status: Home / Self Care | Payer: Medicare HMO | Source: Ambulatory Visit | Attending: Internal Medicine | Admitting: Internal Medicine

## 2015-11-16 DIAGNOSIS — R1012 Left upper quadrant pain: Secondary | ICD-10-CM | POA: Diagnosis not present

## 2015-11-16 DIAGNOSIS — R112 Nausea with vomiting, unspecified: Secondary | ICD-10-CM | POA: Insufficient documentation

## 2015-11-16 DIAGNOSIS — Z9049 Acquired absence of other specified parts of digestive tract: Secondary | ICD-10-CM | POA: Diagnosis not present

## 2015-11-16 DIAGNOSIS — R109 Unspecified abdominal pain: Secondary | ICD-10-CM | POA: Diagnosis not present

## 2015-11-16 DIAGNOSIS — N281 Cyst of kidney, acquired: Secondary | ICD-10-CM | POA: Diagnosis not present

## 2015-11-16 DIAGNOSIS — I700 Atherosclerosis of aorta: Secondary | ICD-10-CM | POA: Insufficient documentation

## 2015-11-16 LAB — POCT I-STAT CREATININE: Creatinine, Ser: 1.2 mg/dL — ABNORMAL HIGH (ref 0.44–1.00)

## 2015-11-16 MED ORDER — DIATRIZOATE MEGLUMINE & SODIUM 66-10 % PO SOLN
ORAL | Status: AC
  Filled 2015-11-16: qty 30

## 2015-11-16 MED ORDER — IOPAMIDOL (ISOVUE-300) INJECTION 61%
100.0000 mL | Freq: Once | INTRAVENOUS | Status: AC | PRN
  Administered 2015-11-16: 100 mL via INTRAVENOUS

## 2015-11-24 ENCOUNTER — Encounter (HOSPITAL_COMMUNITY): Payer: Self-pay | Admitting: Internal Medicine

## 2015-12-04 ENCOUNTER — Encounter (INDEPENDENT_AMBULATORY_CARE_PROVIDER_SITE_OTHER): Payer: Self-pay | Admitting: *Deleted

## 2015-12-05 DIAGNOSIS — M10071 Idiopathic gout, right ankle and foot: Secondary | ICD-10-CM | POA: Diagnosis not present

## 2015-12-05 DIAGNOSIS — M10072 Idiopathic gout, left ankle and foot: Secondary | ICD-10-CM | POA: Diagnosis not present

## 2015-12-05 DIAGNOSIS — M79671 Pain in right foot: Secondary | ICD-10-CM | POA: Diagnosis not present

## 2015-12-05 DIAGNOSIS — M25579 Pain in unspecified ankle and joints of unspecified foot: Secondary | ICD-10-CM | POA: Diagnosis not present

## 2015-12-05 NOTE — Progress Notes (Signed)
Patient has an appointment scheduled for 01/09/16 at 3:15pm with Deberah Castle, NP

## 2016-01-02 DIAGNOSIS — M10072 Idiopathic gout, left ankle and foot: Secondary | ICD-10-CM | POA: Diagnosis not present

## 2016-01-02 DIAGNOSIS — M79671 Pain in right foot: Secondary | ICD-10-CM | POA: Diagnosis not present

## 2016-01-02 DIAGNOSIS — M10071 Idiopathic gout, right ankle and foot: Secondary | ICD-10-CM | POA: Diagnosis not present

## 2016-01-02 DIAGNOSIS — M25579 Pain in unspecified ankle and joints of unspecified foot: Secondary | ICD-10-CM | POA: Diagnosis not present

## 2016-01-09 ENCOUNTER — Ambulatory Visit (INDEPENDENT_AMBULATORY_CARE_PROVIDER_SITE_OTHER): Payer: Medicare HMO | Admitting: Internal Medicine

## 2016-01-23 ENCOUNTER — Ambulatory Visit (INDEPENDENT_AMBULATORY_CARE_PROVIDER_SITE_OTHER): Payer: Medicare HMO | Admitting: Internal Medicine

## 2016-02-06 DIAGNOSIS — I10 Essential (primary) hypertension: Secondary | ICD-10-CM | POA: Diagnosis not present

## 2016-02-06 DIAGNOSIS — E1140 Type 2 diabetes mellitus with diabetic neuropathy, unspecified: Secondary | ICD-10-CM | POA: Diagnosis not present

## 2016-02-06 DIAGNOSIS — E782 Mixed hyperlipidemia: Secondary | ICD-10-CM | POA: Diagnosis not present

## 2016-02-13 DIAGNOSIS — E1165 Type 2 diabetes mellitus with hyperglycemia: Secondary | ICD-10-CM | POA: Diagnosis not present

## 2016-02-13 DIAGNOSIS — E1140 Type 2 diabetes mellitus with diabetic neuropathy, unspecified: Secondary | ICD-10-CM | POA: Diagnosis not present

## 2016-02-13 DIAGNOSIS — R69 Illness, unspecified: Secondary | ICD-10-CM | POA: Diagnosis not present

## 2016-02-13 DIAGNOSIS — Z0001 Encounter for general adult medical examination with abnormal findings: Secondary | ICD-10-CM | POA: Diagnosis not present

## 2016-02-13 DIAGNOSIS — M1A00X0 Idiopathic chronic gout, unspecified site, without tophus (tophi): Secondary | ICD-10-CM | POA: Diagnosis not present

## 2016-02-13 DIAGNOSIS — E039 Hypothyroidism, unspecified: Secondary | ICD-10-CM | POA: Diagnosis not present

## 2016-02-13 DIAGNOSIS — E782 Mixed hyperlipidemia: Secondary | ICD-10-CM | POA: Diagnosis not present

## 2016-02-13 DIAGNOSIS — G2581 Restless legs syndrome: Secondary | ICD-10-CM | POA: Diagnosis not present

## 2016-02-14 DIAGNOSIS — Z23 Encounter for immunization: Secondary | ICD-10-CM | POA: Diagnosis not present

## 2016-03-19 DIAGNOSIS — H4010X1 Unspecified open-angle glaucoma, mild stage: Secondary | ICD-10-CM | POA: Diagnosis not present

## 2016-03-19 DIAGNOSIS — E119 Type 2 diabetes mellitus without complications: Secondary | ICD-10-CM | POA: Diagnosis not present

## 2016-03-19 DIAGNOSIS — H3530 Unspecified macular degeneration: Secondary | ICD-10-CM | POA: Diagnosis not present

## 2016-03-19 DIAGNOSIS — H401132 Primary open-angle glaucoma, bilateral, moderate stage: Secondary | ICD-10-CM | POA: Diagnosis not present

## 2016-03-19 DIAGNOSIS — H2513 Age-related nuclear cataract, bilateral: Secondary | ICD-10-CM | POA: Diagnosis not present

## 2016-03-19 DIAGNOSIS — H26493 Other secondary cataract, bilateral: Secondary | ICD-10-CM | POA: Diagnosis not present

## 2016-03-19 DIAGNOSIS — H43813 Vitreous degeneration, bilateral: Secondary | ICD-10-CM | POA: Diagnosis not present

## 2016-03-19 DIAGNOSIS — H25013 Cortical age-related cataract, bilateral: Secondary | ICD-10-CM | POA: Diagnosis not present

## 2016-03-19 DIAGNOSIS — H5203 Hypermetropia, bilateral: Secondary | ICD-10-CM | POA: Diagnosis not present

## 2016-03-19 DIAGNOSIS — H47323 Drusen of optic disc, bilateral: Secondary | ICD-10-CM | POA: Diagnosis not present

## 2016-03-26 DIAGNOSIS — Z0000 Encounter for general adult medical examination without abnormal findings: Secondary | ICD-10-CM | POA: Diagnosis not present

## 2016-03-26 DIAGNOSIS — E1139 Type 2 diabetes mellitus with other diabetic ophthalmic complication: Secondary | ICD-10-CM | POA: Diagnosis not present

## 2016-03-26 DIAGNOSIS — Z6827 Body mass index (BMI) 27.0-27.9, adult: Secondary | ICD-10-CM | POA: Diagnosis not present

## 2016-03-26 DIAGNOSIS — I10 Essential (primary) hypertension: Secondary | ICD-10-CM | POA: Diagnosis not present

## 2016-03-26 DIAGNOSIS — Z794 Long term (current) use of insulin: Secondary | ICD-10-CM | POA: Diagnosis not present

## 2016-03-26 DIAGNOSIS — K219 Gastro-esophageal reflux disease without esophagitis: Secondary | ICD-10-CM | POA: Diagnosis not present

## 2016-04-24 DIAGNOSIS — M109 Gout, unspecified: Secondary | ICD-10-CM | POA: Diagnosis not present

## 2016-04-24 DIAGNOSIS — M2011 Hallux valgus (acquired), right foot: Secondary | ICD-10-CM | POA: Diagnosis not present

## 2016-04-24 DIAGNOSIS — E1149 Type 2 diabetes mellitus with other diabetic neurological complication: Secondary | ICD-10-CM | POA: Diagnosis not present

## 2016-05-15 DIAGNOSIS — Z6827 Body mass index (BMI) 27.0-27.9, adult: Secondary | ICD-10-CM | POA: Diagnosis not present

## 2016-05-15 DIAGNOSIS — M545 Low back pain: Secondary | ICD-10-CM | POA: Diagnosis not present

## 2016-05-15 DIAGNOSIS — S32020A Wedge compression fracture of second lumbar vertebra, initial encounter for closed fracture: Secondary | ICD-10-CM | POA: Diagnosis not present

## 2016-05-22 DIAGNOSIS — M109 Gout, unspecified: Secondary | ICD-10-CM | POA: Diagnosis not present

## 2016-05-22 DIAGNOSIS — E1149 Type 2 diabetes mellitus with other diabetic neurological complication: Secondary | ICD-10-CM | POA: Diagnosis not present

## 2016-07-22 DIAGNOSIS — Z1231 Encounter for screening mammogram for malignant neoplasm of breast: Secondary | ICD-10-CM | POA: Diagnosis not present

## 2016-07-26 ENCOUNTER — Encounter (INDEPENDENT_AMBULATORY_CARE_PROVIDER_SITE_OTHER): Payer: Self-pay | Admitting: *Deleted

## 2016-08-05 DIAGNOSIS — M109 Gout, unspecified: Secondary | ICD-10-CM | POA: Diagnosis not present

## 2016-08-05 DIAGNOSIS — R1084 Generalized abdominal pain: Secondary | ICD-10-CM | POA: Diagnosis not present

## 2016-08-05 DIAGNOSIS — M1380 Other specified arthritis, unspecified site: Secondary | ICD-10-CM | POA: Diagnosis not present

## 2016-08-05 DIAGNOSIS — J069 Acute upper respiratory infection, unspecified: Secondary | ICD-10-CM | POA: Diagnosis not present

## 2016-08-12 DIAGNOSIS — R634 Abnormal weight loss: Secondary | ICD-10-CM | POA: Diagnosis not present

## 2016-08-12 DIAGNOSIS — I10 Essential (primary) hypertension: Secondary | ICD-10-CM | POA: Diagnosis not present

## 2016-08-12 DIAGNOSIS — R69 Illness, unspecified: Secondary | ICD-10-CM | POA: Diagnosis not present

## 2016-08-12 DIAGNOSIS — E1140 Type 2 diabetes mellitus with diabetic neuropathy, unspecified: Secondary | ICD-10-CM | POA: Diagnosis not present

## 2016-08-12 DIAGNOSIS — G2581 Restless legs syndrome: Secondary | ICD-10-CM | POA: Diagnosis not present

## 2016-08-12 DIAGNOSIS — E782 Mixed hyperlipidemia: Secondary | ICD-10-CM | POA: Diagnosis not present

## 2016-08-12 DIAGNOSIS — K219 Gastro-esophageal reflux disease without esophagitis: Secondary | ICD-10-CM | POA: Diagnosis not present

## 2016-08-12 DIAGNOSIS — E1165 Type 2 diabetes mellitus with hyperglycemia: Secondary | ICD-10-CM | POA: Diagnosis not present

## 2016-08-12 DIAGNOSIS — R5383 Other fatigue: Secondary | ICD-10-CM | POA: Diagnosis not present

## 2016-08-14 DIAGNOSIS — Z6825 Body mass index (BMI) 25.0-25.9, adult: Secondary | ICD-10-CM | POA: Diagnosis not present

## 2016-08-14 DIAGNOSIS — R69 Illness, unspecified: Secondary | ICD-10-CM | POA: Diagnosis not present

## 2016-08-14 DIAGNOSIS — I10 Essential (primary) hypertension: Secondary | ICD-10-CM | POA: Diagnosis not present

## 2016-08-14 DIAGNOSIS — E1165 Type 2 diabetes mellitus with hyperglycemia: Secondary | ICD-10-CM | POA: Diagnosis not present

## 2016-08-14 DIAGNOSIS — M1A00X0 Idiopathic chronic gout, unspecified site, without tophus (tophi): Secondary | ICD-10-CM | POA: Diagnosis not present

## 2016-08-30 DIAGNOSIS — R1110 Vomiting, unspecified: Secondary | ICD-10-CM | POA: Diagnosis not present

## 2016-08-30 DIAGNOSIS — R109 Unspecified abdominal pain: Secondary | ICD-10-CM | POA: Diagnosis not present

## 2016-08-30 DIAGNOSIS — E119 Type 2 diabetes mellitus without complications: Secondary | ICD-10-CM | POA: Diagnosis not present

## 2016-08-30 DIAGNOSIS — R1084 Generalized abdominal pain: Secondary | ICD-10-CM | POA: Diagnosis not present

## 2016-08-30 DIAGNOSIS — Z85038 Personal history of other malignant neoplasm of large intestine: Secondary | ICD-10-CM | POA: Diagnosis not present

## 2016-08-30 DIAGNOSIS — K219 Gastro-esophageal reflux disease without esophagitis: Secondary | ICD-10-CM | POA: Diagnosis not present

## 2016-08-30 DIAGNOSIS — R1111 Vomiting without nausea: Secondary | ICD-10-CM | POA: Diagnosis not present

## 2016-08-30 DIAGNOSIS — Z79899 Other long term (current) drug therapy: Secondary | ICD-10-CM | POA: Diagnosis not present

## 2016-08-30 DIAGNOSIS — B349 Viral infection, unspecified: Secondary | ICD-10-CM | POA: Diagnosis not present

## 2016-08-30 DIAGNOSIS — Z794 Long term (current) use of insulin: Secondary | ICD-10-CM | POA: Diagnosis not present

## 2016-08-30 DIAGNOSIS — R112 Nausea with vomiting, unspecified: Secondary | ICD-10-CM | POA: Diagnosis not present

## 2016-08-30 DIAGNOSIS — R42 Dizziness and giddiness: Secondary | ICD-10-CM | POA: Diagnosis not present

## 2016-08-30 DIAGNOSIS — R69 Illness, unspecified: Secondary | ICD-10-CM | POA: Diagnosis not present

## 2016-09-09 DIAGNOSIS — R69 Illness, unspecified: Secondary | ICD-10-CM | POA: Diagnosis not present

## 2016-09-12 DIAGNOSIS — H401132 Primary open-angle glaucoma, bilateral, moderate stage: Secondary | ICD-10-CM | POA: Diagnosis not present

## 2016-09-30 DIAGNOSIS — Z6825 Body mass index (BMI) 25.0-25.9, adult: Secondary | ICD-10-CM | POA: Diagnosis not present

## 2016-09-30 DIAGNOSIS — M549 Dorsalgia, unspecified: Secondary | ICD-10-CM | POA: Diagnosis not present

## 2016-09-30 DIAGNOSIS — E039 Hypothyroidism, unspecified: Secondary | ICD-10-CM | POA: Diagnosis not present

## 2016-09-30 DIAGNOSIS — R102 Pelvic and perineal pain: Secondary | ICD-10-CM | POA: Diagnosis not present

## 2016-09-30 DIAGNOSIS — E1165 Type 2 diabetes mellitus with hyperglycemia: Secondary | ICD-10-CM | POA: Diagnosis not present

## 2016-10-01 ENCOUNTER — Telehealth (INDEPENDENT_AMBULATORY_CARE_PROVIDER_SITE_OTHER): Payer: Self-pay | Admitting: *Deleted

## 2016-10-01 NOTE — Telephone Encounter (Signed)
Patient called left message requesting to schedule a colonoscopy for July. Please call patient at [phone]

## 2016-10-01 NOTE — Telephone Encounter (Signed)
Patient has been triaged, waiting for Rehman to review

## 2016-10-16 DIAGNOSIS — M1612 Unilateral primary osteoarthritis, left hip: Secondary | ICD-10-CM | POA: Diagnosis not present

## 2016-10-16 DIAGNOSIS — M5410 Radiculopathy, site unspecified: Secondary | ICD-10-CM | POA: Diagnosis not present

## 2016-10-16 DIAGNOSIS — M25552 Pain in left hip: Secondary | ICD-10-CM | POA: Diagnosis not present

## 2016-10-24 ENCOUNTER — Other Ambulatory Visit (INDEPENDENT_AMBULATORY_CARE_PROVIDER_SITE_OTHER): Payer: Self-pay | Admitting: *Deleted

## 2016-10-24 DIAGNOSIS — K5900 Constipation, unspecified: Secondary | ICD-10-CM | POA: Diagnosis not present

## 2016-10-24 DIAGNOSIS — R200 Anesthesia of skin: Secondary | ICD-10-CM | POA: Diagnosis not present

## 2016-10-24 DIAGNOSIS — R0600 Dyspnea, unspecified: Secondary | ICD-10-CM | POA: Diagnosis not present

## 2016-10-24 DIAGNOSIS — R609 Edema, unspecified: Secondary | ICD-10-CM | POA: Diagnosis not present

## 2016-10-24 DIAGNOSIS — I10 Essential (primary) hypertension: Secondary | ICD-10-CM | POA: Diagnosis not present

## 2016-10-24 DIAGNOSIS — Z85038 Personal history of other malignant neoplasm of large intestine: Secondary | ICD-10-CM

## 2016-10-24 DIAGNOSIS — R1110 Vomiting, unspecified: Secondary | ICD-10-CM | POA: Diagnosis not present

## 2016-10-24 DIAGNOSIS — R079 Chest pain, unspecified: Secondary | ICD-10-CM | POA: Diagnosis not present

## 2016-10-24 DIAGNOSIS — R0602 Shortness of breath: Secondary | ICD-10-CM | POA: Diagnosis not present

## 2016-10-24 DIAGNOSIS — Z86718 Personal history of other venous thrombosis and embolism: Secondary | ICD-10-CM | POA: Diagnosis not present

## 2016-10-24 DIAGNOSIS — M109 Gout, unspecified: Secondary | ICD-10-CM | POA: Diagnosis not present

## 2016-10-24 DIAGNOSIS — E119 Type 2 diabetes mellitus without complications: Secondary | ICD-10-CM | POA: Diagnosis not present

## 2016-10-24 DIAGNOSIS — R69 Illness, unspecified: Secondary | ICD-10-CM | POA: Diagnosis not present

## 2016-10-24 DIAGNOSIS — Z6826 Body mass index (BMI) 26.0-26.9, adult: Secondary | ICD-10-CM | POA: Diagnosis not present

## 2016-10-24 DIAGNOSIS — R0789 Other chest pain: Secondary | ICD-10-CM | POA: Diagnosis not present

## 2016-10-25 ENCOUNTER — Telehealth (INDEPENDENT_AMBULATORY_CARE_PROVIDER_SITE_OTHER): Payer: Self-pay | Admitting: *Deleted

## 2016-10-25 ENCOUNTER — Encounter (INDEPENDENT_AMBULATORY_CARE_PROVIDER_SITE_OTHER): Payer: Self-pay | Admitting: *Deleted

## 2016-10-25 DIAGNOSIS — Z85038 Personal history of other malignant neoplasm of large intestine: Secondary | ICD-10-CM | POA: Insufficient documentation

## 2016-10-25 NOTE — Telephone Encounter (Signed)
Patient needs trilyte 

## 2016-10-28 MED ORDER — PEG 3350-KCL-NA BICARB-NACL 420 G PO SOLR: 4000.0000 mL | Freq: Once | ORAL | 0 refills | Status: AC

## 2016-10-31 DIAGNOSIS — E039 Hypothyroidism, unspecified: Secondary | ICD-10-CM | POA: Diagnosis not present

## 2016-10-31 DIAGNOSIS — M1A00X0 Idiopathic chronic gout, unspecified site, without tophus (tophi): Secondary | ICD-10-CM | POA: Diagnosis not present

## 2016-10-31 DIAGNOSIS — K5900 Constipation, unspecified: Secondary | ICD-10-CM | POA: Diagnosis not present

## 2016-10-31 DIAGNOSIS — Z6825 Body mass index (BMI) 25.0-25.9, adult: Secondary | ICD-10-CM | POA: Diagnosis not present

## 2016-10-31 DIAGNOSIS — I10 Essential (primary) hypertension: Secondary | ICD-10-CM | POA: Diagnosis not present

## 2016-11-18 ENCOUNTER — Telehealth (INDEPENDENT_AMBULATORY_CARE_PROVIDER_SITE_OTHER): Payer: Self-pay | Admitting: *Deleted

## 2016-11-18 NOTE — Telephone Encounter (Signed)
agree

## 2016-11-18 NOTE — Telephone Encounter (Signed)
Referring MD/PCP: howard   Procedure: tcs with propofol  Reason/Indication:  Hx colon ca  Has patient had this procedure before?  Yes, 2014  If so, when, by whom and where?    Is there a family history of colon cancer?  no  Who?  What age when diagnosed?    Is patient diabetic?   yes      Does patient have prosthetic heart valve or mechanical valve?  no  Do you have a pacemaker?  no  Has patient ever had endocarditis? no  Has patient had joint replacement within last 12 months?  no  Does patient tend to be constipated or take laxatives? yes  Does patient have a history of alcohol/drug use?  no  Is patient on Coumadin, Plavix and/or Aspirin? yes  Medications: see epic  Allergies: see epic  Medication Adjustment per Dr Laural Golden: asa 2 days  Procedure date & time: 12/13/16 at 830 preop 7/24 @ 10

## 2016-11-30 DIAGNOSIS — G4700 Insomnia, unspecified: Secondary | ICD-10-CM | POA: Diagnosis not present

## 2016-11-30 DIAGNOSIS — Z9181 History of falling: Secondary | ICD-10-CM | POA: Diagnosis not present

## 2016-11-30 DIAGNOSIS — N3946 Mixed incontinence: Secondary | ICD-10-CM | POA: Diagnosis not present

## 2016-11-30 DIAGNOSIS — Z794 Long term (current) use of insulin: Secondary | ICD-10-CM | POA: Diagnosis not present

## 2016-11-30 DIAGNOSIS — Z6825 Body mass index (BMI) 25.0-25.9, adult: Secondary | ICD-10-CM | POA: Diagnosis not present

## 2016-11-30 DIAGNOSIS — K5904 Chronic idiopathic constipation: Secondary | ICD-10-CM | POA: Diagnosis not present

## 2016-11-30 DIAGNOSIS — H9313 Tinnitus, bilateral: Secondary | ICD-10-CM | POA: Diagnosis not present

## 2016-11-30 DIAGNOSIS — Z0000 Encounter for general adult medical examination without abnormal findings: Secondary | ICD-10-CM | POA: Diagnosis not present

## 2016-11-30 DIAGNOSIS — I10 Essential (primary) hypertension: Secondary | ICD-10-CM | POA: Diagnosis not present

## 2016-11-30 DIAGNOSIS — H8110 Benign paroxysmal vertigo, unspecified ear: Secondary | ICD-10-CM | POA: Diagnosis not present

## 2016-11-30 DIAGNOSIS — M160 Bilateral primary osteoarthritis of hip: Secondary | ICD-10-CM | POA: Diagnosis not present

## 2016-11-30 DIAGNOSIS — Z972 Presence of dental prosthetic device (complete) (partial): Secondary | ICD-10-CM | POA: Diagnosis not present

## 2016-11-30 DIAGNOSIS — Z7982 Long term (current) use of aspirin: Secondary | ICD-10-CM | POA: Diagnosis not present

## 2016-11-30 DIAGNOSIS — R69 Illness, unspecified: Secondary | ICD-10-CM | POA: Diagnosis not present

## 2016-11-30 DIAGNOSIS — G2581 Restless legs syndrome: Secondary | ICD-10-CM | POA: Diagnosis not present

## 2016-11-30 DIAGNOSIS — K219 Gastro-esophageal reflux disease without esophagitis: Secondary | ICD-10-CM | POA: Diagnosis not present

## 2016-11-30 DIAGNOSIS — R0602 Shortness of breath: Secondary | ICD-10-CM | POA: Diagnosis not present

## 2016-12-05 NOTE — Patient Instructions (Signed)
Traci Clark  12/05/2016     @PREFPERIOPPHARMACY @   Your procedure is scheduled on  12/13/2016  Report to [facility] at  700 A.M.  Call this number if you have problems the morning of surgery:  [phone]   Remember:  Do not eat food or drink liquids after midnight.  Take these medicines the morning of surgery with A SIP OF WATER  Colchicine, nexium, neurontin, antivert, zofran. Take 1/2 of your usual insulin disage the night before your procedure.   Do not wear jewelry, make-up or nail polish.  Do not wear lotions, powders, or perfumes, or deoderant.  Do not shave 48 hours prior to surgery.  Men may shave face and neck.  Do not bring valuables to the hospital.  [facility] is not responsible for any belongings or valuables.  Contacts, dentures or bridgework may not be worn into surgery.  Leave your suitcase in the car.  After surgery it may be brought to your room.  For patients admitted to the hospital, discharge time will be determined by your treatment team.  Patients discharged the day of surgery will not be allowed to drive home.   Name and phone number of your driver:   family Special instructions:  Follow the diet and prep instructions given to you by Dr Olevia Perches office.  Please read over the following fact sheets that you were given. Anesthesia Post-op Instructions and Care and Recovery After Surgery       Colonoscopy, Adult A colonoscopy is an exam to look at the entire large intestine. During the exam, a lubricated, bendable tube is inserted into the anus and then passed into the rectum, colon, and other parts of the large intestine. A colonoscopy is often done as a part of normal colorectal screening or in response to certain symptoms, such as anemia, persistent diarrhea, abdominal pain, and blood in the stool. The exam can help screen for and diagnose medical problems, including:  Tumors.  Polyps.  Inflammation.  Areas of  bleeding.  Tell a health care provider about:  Any allergies you have.  All medicines you are taking, including vitamins, herbs, eye drops, creams, and over-the-counter medicines.  Any problems you or family members have had with anesthetic medicines.  Any blood disorders you have.  Any surgeries you have had.  Any medical conditions you have.  Any problems you have had passing stool. What are the risks? Generally, this is a safe procedure. However, problems may occur, including:  Bleeding.  A tear in the intestine.  A reaction to medicines given during the exam.  Infection (rare).  What happens before the procedure? Eating and drinking restrictions Follow instructions from your health care provider about eating and drinking, which may include:  A few days before the procedure - follow a low-fiber diet. Avoid nuts, seeds, dried fruit, raw fruits, and vegetables.  1-3 days before the procedure - follow a clear liquid diet. Drink only clear liquids, such as clear broth or bouillon, black coffee or tea, clear juice, clear soft drinks or sports drinks, gelatin dessert, and popsicles. Avoid any liquids that contain red or purple dye.  On the day of the procedure - do not eat or drink anything during the 2 hours before the procedure, or within the time period that your health care provider recommends.  Bowel prep If you were prescribed an oral bowel prep to clean out your colon:  Take it as  told by your health care provider. Starting the day before your procedure, you will need to drink a large amount of medicated liquid. The liquid will cause you to have multiple loose stools until your stool is almost clear or light green.  If your skin or anus gets irritated from diarrhea, you may use these to relieve the irritation: ? Medicated wipes, such as adult wet wipes with aloe and vitamin E. ? A skin soothing-product like petroleum jelly.  If you vomit while drinking the bowel  prep, take a break for up to 60 minutes and then begin the bowel prep again. If vomiting continues and you cannot take the bowel prep without vomiting, call your health care provider.  General instructions  Ask your health care provider about changing or stopping your regular medicines. This is especially important if you are taking diabetes medicines or blood thinners.  Plan to have someone take you home from the hospital or clinic. What happens during the procedure?  An IV tube may be inserted into one of your veins.  You will be given medicine to help you relax (sedative).  To reduce your risk of infection: ? Your health care team will wash or sanitize their hands. ? Your anal area will be washed with soap.  You will be asked to lie on your side with your knees bent.  Your health care provider will lubricate a long, thin, flexible tube. The tube will have a camera and a light on the end.  The tube will be inserted into your anus.  The tube will be gently eased through your rectum and colon.  Air will be delivered into your colon to keep it open. You may feel some pressure or cramping.  The camera will be used to take images during the procedure.  A small tissue sample may be removed from your body to be examined under a microscope (biopsy). If any potential problems are found, the tissue will be sent to a lab for testing.  If small polyps are found, your health care provider may remove them and have them checked for cancer cells.  The tube that was inserted into your anus will be slowly removed. The procedure may vary among health care providers and hospitals. What happens after the procedure?  Your blood pressure, heart rate, breathing rate, and blood oxygen level will be monitored until the medicines you were given have worn off.  Do not drive for 24 hours after the exam.  You may have a small amount of blood in your stool.  You may pass gas and have mild abdominal  cramping or bloating due to the air that was used to inflate your colon during the exam.  It is up to you to get the results of your procedure. Ask your health care provider, or the department performing the procedure, when your results will be ready. This information is not intended to replace advice given to you by your health care provider. Make sure you discuss any questions you have with your health care provider. Document Released: 05/03/2000 Document Revised: 03/06/2016 Document Reviewed: 07/18/2015 Elsevier Interactive Patient Education  2018 [employer].  Colonoscopy, Adult, Care After This sheet gives you information about how to care for yourself after your procedure. Your health care provider may also give you more specific instructions. If you have problems or questions, contact your health care provider. What can I expect after the procedure? After the procedure, it is common to have:  A small amount  of blood in your stool for 24 hours after the procedure.  Some gas.  Mild abdominal cramping or bloating.  Follow these instructions at home: General instructions   For the first 24 hours after the procedure: ? Do not drive or use machinery. ? Do not sign important documents. ? Do not drink alcohol. ? Do your regular daily activities at a slower pace than normal. ? Eat soft, easy-to-digest foods. ? Rest often.  Take over-the-counter or prescription medicines only as told by your health care provider.  It is up to you to get the results of your procedure. Ask your health care provider, or the department performing the procedure, when your results will be ready. Relieving cramping and bloating  Try walking around when you have cramps or feel bloated.  Apply heat to your abdomen as told by your health care provider. Use a heat source that your health care provider recommends, such as a moist heat pack or a heating pad. ? Place a towel between your skin and the heat  source. ? Leave the heat on for 20-30 minutes. ? Remove the heat if your skin turns bright red. This is especially important if you are unable to feel pain, heat, or cold. You may have a greater risk of getting burned. Eating and drinking  Drink enough fluid to keep your urine clear or pale yellow.  Resume your normal diet as instructed by your health care provider. Avoid heavy or fried foods that are hard to digest.  Avoid drinking alcohol for as long as instructed by your health care provider. Contact a health care provider if:  You have blood in your stool 2-3 days after the procedure. Get help right away if:  You have more than a small spotting of blood in your stool.  You pass large blood clots in your stool.  Your abdomen is swollen.  You have nausea or vomiting.  You have a fever.  You have increasing abdominal pain that is not relieved with medicine. This information is not intended to replace advice given to you by your health care provider. Make sure you discuss any questions you have with your health care provider. Document Released: 12/19/2003 Document Revised: 01/29/2016 Document Reviewed: 07/18/2015 Elsevier Interactive Patient Education  2018 [place] Anesthesia is a term that refers to techniques, procedures, and medicines that help a person stay safe and comfortable during a medical procedure. Monitored anesthesia care, or sedation, is one type of anesthesia. Your anesthesia specialist may recommend sedation if you will be having a procedure that does not require you to be unconscious, such as:  Cataract surgery.  A dental procedure.  A biopsy.  A colonoscopy.  During the procedure, you may receive a medicine to help you relax (sedative). There are three levels of sedation:  Mild sedation. At this level, you may feel awake and relaxed. You will be able to follow directions.  Moderate sedation. At this level, you will be  sleepy. You may not remember the procedure.  Deep sedation. At this level, you will be asleep. You will not remember the procedure.  The more medicine you are given, the deeper your level of sedation will be. Depending on how you respond to the procedure, the anesthesia specialist may change your level of sedation or the type of anesthesia to fit your needs. An anesthesia specialist will monitor you closely during the procedure. Let your health care provider know about:  Any allergies you have.  All medicines you are taking, including vitamins, herbs, eye drops, creams, and over-the-counter medicines.  Any use of steroids (by mouth or as a cream).  Any problems you or family members have had with sedatives and anesthetic medicines.  Any blood disorders you have.  Any surgeries you have had.  Any medical conditions you have, such as sleep apnea.  Whether you are pregnant or may be pregnant.  Any use of cigarettes, alcohol, or street drugs. What are the risks? Generally, this is a safe procedure. However, problems may occur, including:  Getting too much medicine (oversedation).  Nausea.  Allergic reaction to medicines.  Trouble breathing. If this happens, a breathing tube may be used to help with breathing. It will be removed when you are awake and breathing on your own.  Heart trouble.  Lung trouble.  Before the procedure Staying hydrated Follow instructions from your health care provider about hydration, which may include:  Up to 2 hours before the procedure - you may continue to drink clear liquids, such as water, clear fruit juice, black coffee, and plain tea.  Eating and drinking restrictions Follow instructions from your health care provider about eating and drinking, which may include:  8 hours before the procedure - stop eating heavy meals or foods such as meat, fried foods, or fatty foods.  6 hours before the procedure - stop eating light meals or foods, such  as toast or cereal.  6 hours before the procedure - stop drinking milk or drinks that contain milk.  2 hours before the procedure - stop drinking clear liquids.  Medicines Ask your health care provider about:  Changing or stopping your regular medicines. This is especially important if you are taking diabetes medicines or blood thinners.  Taking medicines such as aspirin and ibuprofen. These medicines can thin your blood. Do not take these medicines before your procedure if your health care provider instructs you not to.  Tests and exams  You will have a physical exam.  You may have blood tests done to show: ? How well your kidneys and liver are working. ? How well your blood can clot.  General instructions  Plan to have someone take you home from the hospital or clinic.  If you will be going home right after the procedure, plan to have someone with you for 24 hours.  What happens during the procedure?  Your blood pressure, heart rate, breathing, level of pain and overall condition will be monitored.  An IV tube will be inserted into one of your veins.  Your anesthesia specialist will give you medicines as needed to keep you comfortable during the procedure. This may mean changing the level of sedation.  The procedure will be performed. After the procedure  Your blood pressure, heart rate, breathing rate, and blood oxygen level will be monitored until the medicines you were given have worn off.  Do not drive for 24 hours if you received a sedative.  You may: ? Feel sleepy, clumsy, or nauseous. ? Feel forgetful about what happened after the procedure. ? Have a sore throat if you had a breathing tube during the procedure. ? Vomit. This information is not intended to replace advice given to you by your health care provider. Make sure you discuss any questions you have with your health care provider. Document Released: 01/30/2005 Document Revised: 10/13/2015 Document  Reviewed: 08/27/2015 Elsevier Interactive Patient Education  2018 [place], Care After These instructions provide you with information about  caring for yourself after your procedure. Your health care provider may also give you more specific instructions. Your treatment has been planned according to current medical practices, but problems sometimes occur. Call your health care provider if you have any problems or questions after your procedure. What can I expect after the procedure? After your procedure, it is common to:  Feel sleepy for several hours.  Feel clumsy and have poor balance for several hours.  Feel forgetful about what happened after the procedure.  Have poor judgment for several hours.  Feel nauseous or vomit.  Have a sore throat if you had a breathing tube during the procedure.  Follow these instructions at home: For at least 24 hours after the procedure:   Do not: ? Participate in activities in which you could fall or become injured. ? Drive. ? Use heavy machinery. ? Drink alcohol. ? Take sleeping pills or medicines that cause drowsiness. ? Make important decisions or sign legal documents. ? Take care of children on your own.  Rest. Eating and drinking  Follow the diet that is recommended by your health care provider.  If you vomit, drink water, juice, or soup when you can drink without vomiting.  Make sure you have little or no nausea before eating solid foods. General instructions  Have a responsible adult stay with you until you are awake and alert.  Take over-the-counter and prescription medicines only as told by your health care provider.  If you smoke, do not smoke without supervision.  Keep all follow-up visits as told by your health care provider. This is important. Contact a health care provider if:  You keep feeling nauseous or you keep vomiting.  You feel light-headed.  You develop a rash.  You have a  fever. Get help right away if:  You have trouble breathing. This information is not intended to replace advice given to you by your health care provider. Make sure you discuss any questions you have with your health care provider. Document Released: 08/27/2015 Document Revised: 12/27/2015 Document Reviewed: 08/27/2015 Elsevier Interactive Patient Education  [employer].

## 2016-12-10 ENCOUNTER — Encounter (HOSPITAL_COMMUNITY)
Admission: RE | Admit: 2016-12-10 | Discharge: 2016-12-10 | Disposition: A | Discharge status: Home / Self Care | Payer: Medicare HMO | Source: Ambulatory Visit | Attending: Internal Medicine | Admitting: Internal Medicine

## 2016-12-10 ENCOUNTER — Encounter (HOSPITAL_COMMUNITY): Payer: Self-pay

## 2016-12-10 DIAGNOSIS — R69 Illness, unspecified: Secondary | ICD-10-CM | POA: Diagnosis not present

## 2016-12-10 DIAGNOSIS — Z86718 Personal history of other venous thrombosis and embolism: Secondary | ICD-10-CM | POA: Diagnosis not present

## 2016-12-10 DIAGNOSIS — K5900 Constipation, unspecified: Secondary | ICD-10-CM | POA: Diagnosis not present

## 2016-12-10 DIAGNOSIS — Z794 Long term (current) use of insulin: Secondary | ICD-10-CM | POA: Diagnosis not present

## 2016-12-10 DIAGNOSIS — F419 Anxiety disorder, unspecified: Secondary | ICD-10-CM | POA: Diagnosis not present

## 2016-12-10 DIAGNOSIS — Z79899 Other long term (current) drug therapy: Secondary | ICD-10-CM | POA: Diagnosis not present

## 2016-12-10 DIAGNOSIS — E119 Type 2 diabetes mellitus without complications: Secondary | ICD-10-CM | POA: Diagnosis not present

## 2016-12-10 DIAGNOSIS — Z85038 Personal history of other malignant neoplasm of large intestine: Secondary | ICD-10-CM | POA: Diagnosis not present

## 2016-12-10 DIAGNOSIS — D123 Benign neoplasm of transverse colon: Secondary | ICD-10-CM | POA: Diagnosis not present

## 2016-12-10 DIAGNOSIS — Z9049 Acquired absence of other specified parts of digestive tract: Secondary | ICD-10-CM | POA: Diagnosis not present

## 2016-12-10 DIAGNOSIS — Z880 Allergy status to penicillin: Secondary | ICD-10-CM | POA: Diagnosis not present

## 2016-12-10 DIAGNOSIS — Z885 Allergy status to narcotic agent status: Secondary | ICD-10-CM | POA: Diagnosis not present

## 2016-12-10 DIAGNOSIS — Z01810 Encounter for preprocedural cardiovascular examination: Secondary | ICD-10-CM | POA: Diagnosis not present

## 2016-12-10 DIAGNOSIS — K644 Residual hemorrhoidal skin tags: Secondary | ICD-10-CM | POA: Diagnosis not present

## 2016-12-10 DIAGNOSIS — M1990 Unspecified osteoarthritis, unspecified site: Secondary | ICD-10-CM | POA: Diagnosis not present

## 2016-12-10 DIAGNOSIS — Z980 Intestinal bypass and anastomosis status: Secondary | ICD-10-CM | POA: Diagnosis not present

## 2016-12-10 DIAGNOSIS — I252 Old myocardial infarction: Secondary | ICD-10-CM | POA: Diagnosis not present

## 2016-12-10 DIAGNOSIS — Z7982 Long term (current) use of aspirin: Secondary | ICD-10-CM | POA: Diagnosis not present

## 2016-12-10 DIAGNOSIS — Z888 Allergy status to other drugs, medicaments and biological substances status: Secondary | ICD-10-CM | POA: Diagnosis not present

## 2016-12-10 DIAGNOSIS — K219 Gastro-esophageal reflux disease without esophagitis: Secondary | ICD-10-CM | POA: Diagnosis not present

## 2016-12-10 HISTORY — DX: Personal history of urinary calculi: Z87.442

## 2016-12-10 HISTORY — DX: Gout, unspecified: M10.9

## 2016-12-10 HISTORY — DX: Nausea with vomiting, unspecified: R11.2

## 2016-12-10 HISTORY — DX: Dizziness and giddiness: R42

## 2016-12-10 HISTORY — DX: Type 2 diabetes mellitus without complications: E11.9

## 2016-12-10 HISTORY — DX: Restless legs syndrome: G25.81

## 2016-12-10 HISTORY — DX: Anxiety disorder, unspecified: F41.9

## 2016-12-10 HISTORY — DX: Adverse effect of unspecified anesthetic, initial encounter: T41.45XA

## 2016-12-10 HISTORY — DX: Unspecified osteoarthritis, unspecified site: M19.90

## 2016-12-10 HISTORY — DX: Other specified postprocedural states: Z98.890

## 2016-12-10 HISTORY — DX: Other complications of anesthesia, initial encounter: T88.59XA

## 2016-12-10 LAB — CBC WITH DIFFERENTIAL/PLATELET
Basophils Absolute: 0 10*3/uL (ref 0.0–0.1)
Basophils Relative: 1 %
Eosinophils Absolute: 0.1 10*3/uL (ref 0.0–0.7)
Eosinophils Relative: 3 %
HCT: 37.7 % (ref 36.0–46.0)
Hemoglobin: 12.8 g/dL (ref 12.0–15.0)
Lymphocytes Relative: 45 %
Lymphs Abs: 2 10*3/uL (ref 0.7–4.0)
MCH: 30.1 pg (ref 26.0–34.0)
MCHC: 34 g/dL (ref 30.0–36.0)
MCV: 88.7 fL (ref 78.0–100.0)
Monocytes Absolute: 0.3 10*3/uL (ref 0.1–1.0)
Monocytes Relative: 8 %
Neutro Abs: 1.9 10*3/uL (ref 1.7–7.7)
Neutrophils Relative %: 44 %
Platelets: 116 10*3/uL — ABNORMAL LOW (ref 150–400)
RBC: 4.25 MIL/uL (ref 3.87–5.11)
RDW: 13.2 % (ref 11.5–15.5)
WBC: 4.4 10*3/uL (ref 4.0–10.5)

## 2016-12-10 LAB — BASIC METABOLIC PANEL
Anion gap: 8 (ref 5–15)
BUN: 10 mg/dL (ref 6–20)
CO2: 28 mmol/L (ref 22–32)
CREATININE: 1.12 mg/dL — AB (ref 0.44–1.00)
Calcium: 9.4 mg/dL (ref 8.9–10.3)
Chloride: 100 mmol/L — ABNORMAL LOW (ref 101–111)
GFR, EST AFRICAN AMERICAN: 51 mL/min — AB (ref >60)
GFR, EST NON AFRICAN AMERICAN: 44 mL/min — AB (ref >60)
Glucose, Bld: 168 mg/dL — ABNORMAL HIGH (ref 65–99)
POTASSIUM: 4.4 mmol/L (ref 3.5–5.1)
SODIUM: 136 mmol/L (ref 135–145)

## 2016-12-13 ENCOUNTER — Encounter (HOSPITAL_COMMUNITY)
Admission: RE | Disposition: A | Discharge status: Home / Self Care | Payer: Self-pay | Source: Ambulatory Visit | Attending: Internal Medicine

## 2016-12-13 ENCOUNTER — Ambulatory Visit (HOSPITAL_COMMUNITY): Payer: Medicare HMO | Admitting: Anesthesiology

## 2016-12-13 ENCOUNTER — Ambulatory Visit (HOSPITAL_COMMUNITY)
Admission: RE | Admit: 2016-12-13 | Discharge: 2016-12-13 | Disposition: A | Discharge status: Home / Self Care | Payer: Medicare HMO | Source: Ambulatory Visit | Attending: Internal Medicine | Admitting: Internal Medicine

## 2016-12-13 DIAGNOSIS — M1990 Unspecified osteoarthritis, unspecified site: Secondary | ICD-10-CM | POA: Diagnosis not present

## 2016-12-13 DIAGNOSIS — K635 Polyp of colon: Secondary | ICD-10-CM | POA: Diagnosis not present

## 2016-12-13 DIAGNOSIS — R69 Illness, unspecified: Secondary | ICD-10-CM | POA: Diagnosis not present

## 2016-12-13 DIAGNOSIS — D123 Benign neoplasm of transverse colon: Secondary | ICD-10-CM | POA: Insufficient documentation

## 2016-12-13 DIAGNOSIS — Z01810 Encounter for preprocedural cardiovascular examination: Secondary | ICD-10-CM | POA: Insufficient documentation

## 2016-12-13 DIAGNOSIS — K644 Residual hemorrhoidal skin tags: Secondary | ICD-10-CM | POA: Diagnosis not present

## 2016-12-13 DIAGNOSIS — K5900 Constipation, unspecified: Secondary | ICD-10-CM | POA: Diagnosis not present

## 2016-12-13 DIAGNOSIS — Z79899 Other long term (current) drug therapy: Secondary | ICD-10-CM | POA: Insufficient documentation

## 2016-12-13 DIAGNOSIS — Z85038 Personal history of other malignant neoplasm of large intestine: Secondary | ICD-10-CM | POA: Diagnosis not present

## 2016-12-13 DIAGNOSIS — F419 Anxiety disorder, unspecified: Secondary | ICD-10-CM | POA: Insufficient documentation

## 2016-12-13 DIAGNOSIS — E119 Type 2 diabetes mellitus without complications: Secondary | ICD-10-CM | POA: Insufficient documentation

## 2016-12-13 DIAGNOSIS — Z888 Allergy status to other drugs, medicaments and biological substances status: Secondary | ICD-10-CM | POA: Insufficient documentation

## 2016-12-13 DIAGNOSIS — Z880 Allergy status to penicillin: Secondary | ICD-10-CM | POA: Insufficient documentation

## 2016-12-13 DIAGNOSIS — Z7982 Long term (current) use of aspirin: Secondary | ICD-10-CM | POA: Insufficient documentation

## 2016-12-13 DIAGNOSIS — Z885 Allergy status to narcotic agent status: Secondary | ICD-10-CM | POA: Insufficient documentation

## 2016-12-13 DIAGNOSIS — Z980 Intestinal bypass and anastomosis status: Secondary | ICD-10-CM | POA: Insufficient documentation

## 2016-12-13 DIAGNOSIS — I252 Old myocardial infarction: Secondary | ICD-10-CM | POA: Insufficient documentation

## 2016-12-13 DIAGNOSIS — Z86718 Personal history of other venous thrombosis and embolism: Secondary | ICD-10-CM | POA: Insufficient documentation

## 2016-12-13 DIAGNOSIS — Z794 Long term (current) use of insulin: Secondary | ICD-10-CM | POA: Insufficient documentation

## 2016-12-13 DIAGNOSIS — Z9049 Acquired absence of other specified parts of digestive tract: Secondary | ICD-10-CM | POA: Insufficient documentation

## 2016-12-13 DIAGNOSIS — Z08 Encounter for follow-up examination after completed treatment for malignant neoplasm: Secondary | ICD-10-CM | POA: Diagnosis not present

## 2016-12-13 DIAGNOSIS — K219 Gastro-esophageal reflux disease without esophagitis: Secondary | ICD-10-CM | POA: Insufficient documentation

## 2016-12-13 HISTORY — PX: COLONOSCOPY WITH PROPOFOL: SHX5780

## 2016-12-13 HISTORY — PX: POLYPECTOMY: SHX5525

## 2016-12-13 LAB — GLUCOSE, CAPILLARY: Glucose-Capillary: 95 mg/dL (ref 65–99)

## 2016-12-13 SURGERY — COLONOSCOPY WITH PROPOFOL
Anesthesia: Monitor Anesthesia Care

## 2016-12-13 MED ORDER — ONDANSETRON HCL 4 MG/2ML IJ SOLN
INTRAMUSCULAR | Status: AC
  Filled 2016-12-13: qty 2

## 2016-12-13 MED ORDER — LACTATED RINGERS IV SOLN
INTRAVENOUS | Status: DC
  Administered 2016-12-13: 08:00:00 via INTRAVENOUS

## 2016-12-13 MED ORDER — PROPOFOL 500 MG/50ML IV EMUL
INTRAVENOUS | Status: DC | PRN
  Administered 2016-12-13: 120 ug/kg/min via INTRAVENOUS

## 2016-12-13 MED ORDER — PROPOFOL 10 MG/ML IV BOLUS
INTRAVENOUS | Status: AC
  Filled 2016-12-13: qty 40

## 2016-12-13 MED ORDER — PROPOFOL 10 MG/ML IV BOLUS
INTRAVENOUS | Status: AC
  Filled 2016-12-13: qty 20

## 2016-12-13 MED ORDER — MIDAZOLAM HCL 2 MG/2ML IJ SOLN
1.0000 mg | INTRAMUSCULAR | Status: AC
  Administered 2016-12-13 (×2): 1 mg via INTRAVENOUS

## 2016-12-13 MED ORDER — LINACLOTIDE 145 MCG PO CAPS: 145.0000 ug | ORAL_CAPSULE | Freq: Every day | ORAL | 5 refills | Status: DC

## 2016-12-13 MED ORDER — MIDAZOLAM HCL 2 MG/2ML IJ SOLN
INTRAMUSCULAR | Status: AC
  Filled 2016-12-13: qty 2

## 2016-12-13 MED ORDER — ONDANSETRON HCL 4 MG/2ML IJ SOLN
4.0000 mg | Freq: Once | INTRAMUSCULAR | Status: AC
  Administered 2016-12-13: 4 mg via INTRAVENOUS

## 2016-12-13 MED ORDER — MIDAZOLAM HCL 5 MG/5ML IJ SOLN
INTRAMUSCULAR | Status: DC | PRN
  Administered 2016-12-13: 2 mg via INTRAVENOUS

## 2016-12-13 NOTE — Anesthesia Preprocedure Evaluation (Signed)
Anesthesia Evaluation  Patient identified by MRN, date of birth, ID band Patient awake    Reviewed: Allergy & Precautions, NPO status , Patient's Chart, lab work & pertinent test results  History of Anesthesia Complications (+) PONV and history of anesthetic complications  Airway Mallampati: III  TM Distance: >3 FB Neck ROM: Full    Dental  (+) Partial Upper, Partial Lower   Pulmonary neg pulmonary ROS,    breath sounds clear to auscultation       Cardiovascular + Past MI and + DVT   Rhythm:Regular Rate:Normal     Neuro/Psych PSYCHIATRIC DISORDERS Anxiety negative neurological ROS     GI/Hepatic hiatal hernia, GERD  ,  Endo/Other  diabetes, Type 2, Insulin Dependent  Renal/GU      Musculoskeletal   Abdominal   Peds  Hematology   Anesthesia Other Findings   Reproductive/Obstetrics                             Anesthesia Physical Anesthesia Plan  ASA: III  Anesthesia Plan: MAC   Post-op Pain Management:    Induction: Intravenous  PONV Risk Score and Plan:   Airway Management Planned: Simple Face Mask  Additional Equipment:   Intra-op Plan:   Post-operative Plan:   Informed Consent: I have reviewed the patients History and Physical, chart, labs and discussed the procedure including the risks, benefits and alternatives for the proposed anesthesia with the patient or authorized representative who has indicated his/her understanding and acceptance.     Plan Discussed with:   Anesthesia Plan Comments:         Anesthesia Quick Evaluation

## 2016-12-13 NOTE — Anesthesia Postprocedure Evaluation (Signed)
Anesthesia Post Note  Patient: Traci Clark  Procedure(s) Performed: Procedure(s) (LRB): COLONOSCOPY WITH PROPOFOL (N/A) POLYPECTOMY  Patient location during evaluation: PACU Anesthesia Type: MAC Level of consciousness: awake and patient cooperative Pain management: pain level controlled Vital Signs Assessment: post-procedure vital signs reviewed and stable Respiratory status: spontaneous breathing, nonlabored ventilation and respiratory function stable Cardiovascular status: blood pressure returned to baseline Postop Assessment: no signs of nausea or vomiting Anesthetic complications: no     Last Vitals:  Vitals:   12/13/16 0805 12/13/16 0815  BP: 132/70 137/75  Pulse: (!) 55 (!) 57  Resp: 19 (!) 36  Temp:      Last Pain:  Vitals:   12/13/16 0834  TempSrc:   PainSc: 5                  Kashae Carstens J

## 2016-12-13 NOTE — Discharge Instructions (Signed)
High-Fiber Diet Fiber, also called dietary fiber, is a type of carbohydrate found in fruits, vegetables, whole grains, and beans. A high-fiber diet can have many health benefits. Your health care provider may recommend a high-fiber diet to help:  Prevent constipation. Fiber can make your bowel movements more regular.  Lower your cholesterol.  Relieve hemorrhoids, uncomplicated diverticulosis, or irritable bowel syndrome.  Prevent overeating as part of a weight-loss plan.  Prevent heart disease, type 2 diabetes, and certain cancers.  What is my plan? The recommended daily intake of fiber includes:  38 grams for men under age 67.  31 grams for men over age 71.  61 grams for women under age 60.  32 grams for women over age 13.  You can get the recommended daily intake of dietary fiber by eating a variety of fruits, vegetables, grains, and beans. Your health care provider may also recommend a fiber supplement if it is not possible to get enough fiber through your diet. What do I need to know about a high-fiber diet?  Fiber supplements have not been widely studied for their effectiveness, so it is better to get fiber through food sources.  Always check the fiber content on thenutrition facts label of any prepackaged food. Look for foods that contain at least 5 grams of fiber per serving.  Ask your dietitian if you have questions about specific foods that are related to your condition, especially if those foods are not listed in the following section.  Increase your daily fiber consumption gradually. Increasing your intake of dietary fiber too quickly may cause bloating, cramping, or gas.  Drink plenty of water. Water helps you to digest fiber. What foods can I eat? Grains Whole-grain breads. Multigrain cereal. Oats and oatmeal. Brown rice. Barley. Bulgur wheat. [place]. Bran muffins. Popcorn. Rye wafer crackers. Vegetables Sweet potatoes. Spinach. Kale. Artichokes. Cabbage.  Broccoli. Green peas. Carrots. Squash. Fruits Berries. Pears. Apples. Oranges. Avocados. Prunes and raisins. Dried figs. Meats and Other Protein Sources [place], kidney, pinto, and soy beans. Split peas. Lentils. Nuts and seeds. Dairy Fiber-fortified yogurt. Beverages Fiber-fortified soy milk. Fiber-fortified orange juice. Other Fiber bars. The items listed above may not be a complete list of recommended foods or beverages. Contact your dietitian for more options. What foods are not recommended? Grains White bread. Pasta made with refined flour. White rice. Vegetables Fried potatoes. Canned vegetables. Well-cooked vegetables. Fruits Fruit juice. Cooked, strained fruit. Meats and Other Protein Sources Fatty cuts of meat. Fried Sales executive or fried fish. Dairy Milk. Yogurt. Cream cheese. Sour cream. Beverages Soft drinks. Other Cakes and pastries. Butter and oils. The items listed above may not be a complete list of foods and beverages to avoid. Contact your dietitian for more information. What are some tips for including high-fiber foods in my diet?  Eat a wide variety of high-fiber foods.  Make sure that half of all grains consumed each day are whole grains.  Replace breads and cereals made from refined flour or white flour with whole-grain breads and cereals.  Replace white rice with brown rice, bulgur wheat, or millet.  Start the day with a breakfast that is high in fiber, such as a cereal that contains at least 5 grams of fiber per serving.  Use beans in place of meat in soups, salads, or pasta.  Eat high-fiber snacks, such as berries, raw vegetables, nuts, or popcorn. This information is not intended to replace advice given to you by your health care provider. Make sure you discuss any  questions you have with your health care provider. Document Released: 05/06/2005 Document Revised: 10/12/2015 Document Reviewed: 10/19/2013 Elsevier Interactive Patient Education  2017  [place]. Colonoscopy, Adult, Care After This sheet gives you information about how to care for yourself after your procedure. Your health care provider may also give you more specific instructions. If you have problems or questions, contact your health care provider. What can I expect after the procedure? After the procedure, it is common to have:  A small amount of blood in your stool for 24 hours after the procedure.  Some gas.  Mild abdominal cramping or bloating.  Follow these instructions at home: General instructions   For the first 24 hours after the procedure: ? Do not drive or use machinery. ? Do not sign important documents. ? Do not drink alcohol. ? Do your regular daily activities at a slower pace than normal. ? Eat soft, easy-to-digest foods. ? Rest often.  Take over-the-counter or prescription medicines only as told by your health care provider.  It is up to you to get the results of your procedure. Ask your health care provider, or the department performing the procedure, when your results will be ready. Relieving cramping and bloating  Try walking around when you have cramps or feel bloated.  Apply heat to your abdomen as told by your health care provider. Use a heat source that your health care provider recommends, such as a moist heat pack or a heating pad. ? Place a towel between your skin and the heat source. ? Leave the heat on for 20-30 minutes. ? Remove the heat if your skin turns bright red. This is especially important if you are unable to feel pain, heat, or cold. You may have a greater risk of getting burned. Eating and drinking  Drink enough fluid to keep your urine clear or pale yellow.  Resume your normal diet as instructed by your health care provider. Avoid heavy or fried foods that are hard to digest.  Avoid drinking alcohol for as long as instructed by your health care provider. Contact a health care provider if:  You have blood in  your stool 2-3 days after the procedure. Get help right away if:  You have more than a small spotting of blood in your stool.  You pass large blood clots in your stool.  Your abdomen is swollen.  You have nausea or vomiting.  You have a fever.  You have increasing abdominal pain that is not relieved with medicine. This information is not intended to replace advice given to you by your health care provider. Make sure you discuss any questions you have with your health care provider. Document Released: 12/19/2003 Document Revised: 01/29/2016 Document Reviewed: 07/18/2015 Elsevier Interactive Patient Education  2018 [place] usual medications including aspirin. Linzess and 45 g by mouth every morning. High fiber diet. No driving for 24 hours. Physician will call with biopsy results.

## 2016-12-13 NOTE — H&P (Signed)
Traci Clark is an 83 y.o. female.   Chief Complaint: Patient is here for colonoscopy. HPI: Issues 83 year old Caucasian female who is here for surveillance colonoscopy. She has history of colon cancer. She underwent right hemicolectomy in 2002. Last colonoscopy was unremarkable but for years ago. She denies rectal bleeding. She's been constipated for 3 months. She has been drinking apple juice and taking MiraLAX with some results but not were satisfactory. She is complaining of nausea this morning. Family history is negative for CRC.  Past Medical History:  Diagnosis Date  . Anxiety   . Arthritis   . Colon cancer ([place])   . Complication of anesthesia   . Diabetes mellitus without complication ([place])   . DVT (deep vein thrombosis) in pregnancy ([place])   . GERD (gastroesophageal reflux disease)   . Glaucoma   . Gout       . History of kidney stones   . MI (myocardial infarction) ([place]) 1993  . PONV (postoperative nausea and vomiting)   . Restless leg syndrome   . Vertigo     Past Surgical History:  Procedure Laterality Date  . CHOLECYSTECTOMY    . COLECTOMY  2003  . COLONOSCOPY N/A 07/29/2012   Procedure: COLONOSCOPY;  Surgeon: Rogene Houston, MD;  [facility];  Service: Endoscopy;  Laterality: N/A;  100  . ESOPHAGEAL DILATION  11/03/2015   Procedure: ESOPHAGEAL DILATION;  Surgeon: Rogene Houston, MD;  [facility]: [facility];  Service: Endoscopy;;  . ESOPHAGOGASTRODUODENOSCOPY N/A 03/25/2014   Procedure: ESOPHAGOGASTRODUODENOSCOPY (EGD);  Surgeon: Rogene Houston, MD;  [facility];  Service: Endoscopy;  Laterality: N/A;  1055  . ESOPHAGOGASTRODUODENOSCOPY N/A 11/03/2015   Procedure: ESOPHAGOGASTRODUODENOSCOPY (EGD);  Surgeon: Rogene Houston, MD;  [facility];  Service: Endoscopy;  Laterality: N/A;  1:55 - moved to 11:15 - Ann to notify - moved to 12:45 - pt knows to arrive at 11:45  . MALONEY DILATION N/A 03/25/2014   Procedure: Venia Minks DILATION;   Surgeon: Rogene Houston, MD;  [facility];  Service: Endoscopy;  Laterality: N/A;  . TUBAL LIGATION    . VEIN SURGERY Bilateral    stripped vein due to DVT    No family history on file. Social History:  reports that she has never smoked. She has never used smokeless tobacco. She reports that she does not drink alcohol or use drugs.  Allergies:  Allergies  Allergen Reactions  . Amoxicillin Anaphylaxis and Rash    "Skin peeled off"  . Macrobid [Nitrofurantoin Monohyd Macro] Anaphylaxis, Rash and Other (See Comments)    "Skin peeled off"  . Codeine Nausea And Vomiting and Other (See Comments)    shaking  . Darvon [Propoxyphene] Nausea And Vomiting and Other (See Comments)    shaking    Medications Prior to Admission  Medication Sig Dispense Refill  . acetaminophen (TYLENOL) 325 MG tablet Take 650 mg by mouth every 6 (six) hours as needed for pain.    Marland Kitchen ALPRAZolam (XANAX) 0.5 MG tablet Take 0.5 mg by mouth at bedtime as needed for sleep.    Marland Kitchen alum & mag hydroxide-simeth (MAALOX/MYLANTA) 200-200-20 MG/5ML suspension Take 30 mLs by mouth daily.    Marland Kitchen aspirin 81 MG tablet Take 1 tablet (81 mg total) by mouth daily. 30 tablet   . colchicine 0.6 MG tablet Take 0.6 mg by mouth daily.    Marland Kitchen esomeprazole (NEXIUM) 40 MG capsule Take 40 mg by mouth daily before breakfast.    .  gabapentin (NEURONTIN) 300 MG capsule Take 300 mg by mouth at bedtime.     . hydrochlorothiazide (HYDRODIURIL) 25 MG tablet Take 25 mg by mouth daily.    . insulin glargine (LANTUS) 100 UNIT/ML injection Inject 14 Units into the skin at bedtime. 14 units at night    . insulin lispro (HUMALOG) 100 UNIT/ML injection Inject 4-10 Units into the skin 3 (three) times daily before meals. Sliding scale 151- 200=4 units; 201-250= 6 units; 251-300= 8 units; 301-350= 10 units.    . meclizine (ANTIVERT) 25 MG tablet Take 25 mg by mouth 3 (three) times daily as needed for dizziness.     . Multiple Vitamins-Minerals (HAIR  SKIN AND NAILS FORMULA) TABS Take 1 tablet by mouth every morning.     . nystatin (MYCOSTATIN) 100000 UNIT/ML suspension Take 5 mLs (500,000 Units total) by mouth 4 (four) times daily. 240 mL 0  . ondansetron (ZOFRAN-ODT) 8 MG disintegrating tablet Take 1 tablet by mouth every 6 (six) hours as needed for nausea or vomiting.   0    No results found for this or any previous visit (from the past 48 hour(s)). No results found.  ROS  There were no vitals taken for this visit. Physical Exam  Constitutional: She appears well-developed and well-nourished.  HENT:  Mouth/Throat: Oropharynx is clear and moist.  Eyes: Conjunctivae are normal. No scleral icterus.  Neck: No thyromegaly present.  Cardiovascular: Normal rate, regular rhythm and normal heart sounds.   No murmur heard. Respiratory: Effort normal and breath sounds normal.  GI:  Abdomen is full. She has long scar across upper abdomen extending into the right flank. Abdomen is soft. She has mild generalized tenderness. No organomegaly or masses.  Musculoskeletal: She exhibits no edema.  Lymphadenopathy:    She has no cervical adenopathy.  Neurological: She is alert.  Skin: Skin is warm and dry.     Assessment/Plan History of CRC. Recent onset of constipation. Surveillance colonoscopy under monitored anesthesia care. Patient will be given ondansetron 4 mg IV for nausea.  Hildred Laser, MD 12/13/2016, 7:25 AM

## 2016-12-13 NOTE — Op Note (Signed)
[facility] Patient Name: Traci Clark Procedure Date: 12/13/2016 8:19 AM MRN: [id number] Date of Birth: 08-Jan-1931 Attending MD: Hildred Laser , MD CSN: [id number] Age: 83 Admit Type: Outpatient Procedure:                Colonoscopy Indications:              High risk colon cancer surveillance: Personal                            history of colon cancer Providers:                Hildred Laser, MD, Otis Peak B. Sharon Seller, RN, Randa Spike, Technician Referring MD:             Rory Percy, MD Medicines:                Propofol per Anesthesia Complications:            No immediate complications. Estimated Blood Loss:     Estimated blood loss was minimal. Procedure:                Pre-Anesthesia Assessment:                           - Prior to the procedure, a History and Physical                            was performed, and patient medications and                            allergies were reviewed. The patient's tolerance of                            previous anesthesia was also reviewed. The risks                            and benefits of the procedure and the sedation                            options and risks were discussed with the patient.                            All questions were answered, and informed consent                            was obtained. Prior Anticoagulants: The patient                            last took aspirin 3 days prior to the procedure.                            ASA Grade Assessment: II - A patient with mild  systemic disease. After reviewing the risks and                            benefits, the patient was deemed in satisfactory                            condition to undergo the procedure.                           After obtaining informed consent, the colonoscope                            was passed under direct vision. Throughout the                            procedure, the patient's  blood pressure, pulse, and                            oxygen saturations were monitored continuously. The                            [id number] ([id number]) scope was introduced through                            the anus and advanced to the the ileocolonic                            anastomosis. The colonoscopy was performed without                            difficulty. The patient tolerated the procedure                            well. The quality of the bowel preparation was                            excellent. The rectumileocolonic anastomosis were                            photographed. Scope In: 8:43:22 AM Scope Out: 8:59:02 AM Scope Withdrawal Time: 0 hours 8 minutes 46 seconds  Total Procedure Duration: 0 hours 15 minutes 40 seconds  Findings:      The perianal and digital rectal examinations were normal.      There was evidence of a prior end-to-side colo-colonic anastomosis at       the hepatic flexure. This was patent and was characterized by healthy       appearing mucosa. The anastomosis was traversed.      A small polyp was found in the splenic flexure. The polyp was sessile.       Biopsies were taken with a cold forceps for histology. The pathology       specimen was placed into Bottle Number 1.      External hemorrhoids were found during retroflexion. The hemorrhoids       were small. Impression:               -  Patent end-to-side colo-colonic anastomosis,                            characterized by healthy appearing mucosa.                           - One small polyp at the splenic flexure. Biopsied.                           - External hemorrhoids. Moderate Sedation:      Per Anesthesia Care Recommendation:           - Patient has a contact number available for                            emergencies. The signs and symptoms of potential                            delayed complications were discussed with the                            patient. Return to normal  activities tomorrow.                            Written discharge instructions were provided to the                            patient.                           - High fiber diet today.                           - Continue present medications.                           - Resume aspirin at prior dose tomorrow.                           - Await pathology results.                           - Linzess 145 g by mouth every morning.                           - No recommendation at this time regarding repeat                            colonoscopy due to age. Procedure Code(s):        --- Professional ---                           [phone], Colonoscopy, flexible; with biopsy, single                            or multiple Diagnosis Code(s):        --- Professional ---  Z85.038, Personal history of other malignant                            neoplasm of large intestine                           Z98.0, Intestinal bypass and anastomosis status                           D12.3, Benign neoplasm of transverse colon (hepatic                            flexure or splenic flexure)                           K64.4, Residual hemorrhoidal skin tags CPT copyright 2016 American [employer]. All rights reserved. The codes documented in this report are preliminary and upon coder review may  be revised to meet current compliance requirements. Hildred Laser, MD Hildred Laser, MD 12/13/2016 9:12:17 AM This report has been signed electronically. Number of Addenda: 0

## 2016-12-13 NOTE — Transfer of Care (Signed)
Immediate Anesthesia Transfer of Care Note  Patient: Traci Clark  Procedure(s) Performed: Procedure(s) with comments: COLONOSCOPY WITH PROPOFOL (N/A) - 8:30 POLYPECTOMY - polyp  Patient Location: PACU  Anesthesia Type:MAC  Level of Consciousness: awake and patient cooperative  Airway & Oxygen Therapy: Patient Spontanous Breathing and Patient connected to face mask oxygen  Post-op Assessment: Report given to RN, Post -op Vital signs reviewed and stable and Patient moving all extremities  Post vital signs: Reviewed and stable  Last Vitals:  Vitals:   12/13/16 0805 12/13/16 0815  BP: 132/70 137/75  Pulse: (!) 55 (!) 57  Resp: 19 (!) 36  Temp:      Last Pain:  Vitals:   12/13/16 0834  TempSrc:   PainSc: 5       Patients Stated Pain Goal: 6 (58/30/94 0768)  Complications: No apparent anesthesia complications

## 2016-12-16 ENCOUNTER — Encounter (HOSPITAL_COMMUNITY): Payer: Self-pay | Admitting: Internal Medicine

## 2016-12-17 DIAGNOSIS — R69 Illness, unspecified: Secondary | ICD-10-CM | POA: Diagnosis not present

## 2016-12-20 ENCOUNTER — Other Ambulatory Visit (INDEPENDENT_AMBULATORY_CARE_PROVIDER_SITE_OTHER): Payer: Self-pay | Admitting: Internal Medicine

## 2016-12-20 MED ORDER — LINACLOTIDE 145 MCG PO CAPS: 145.0000 ug | ORAL_CAPSULE | Freq: Every day | ORAL | 5 refills | Status: DC

## 2017-01-17 DIAGNOSIS — Z6825 Body mass index (BMI) 25.0-25.9, adult: Secondary | ICD-10-CM | POA: Diagnosis not present

## 2017-01-17 DIAGNOSIS — M130 Polyarthritis, unspecified: Secondary | ICD-10-CM | POA: Diagnosis not present

## 2017-01-17 DIAGNOSIS — G2581 Restless legs syndrome: Secondary | ICD-10-CM | POA: Diagnosis not present

## 2017-02-10 DIAGNOSIS — R739 Hyperglycemia, unspecified: Secondary | ICD-10-CM | POA: Diagnosis not present

## 2017-02-10 DIAGNOSIS — M130 Polyarthritis, unspecified: Secondary | ICD-10-CM | POA: Diagnosis not present

## 2017-02-10 DIAGNOSIS — R5383 Other fatigue: Secondary | ICD-10-CM | POA: Diagnosis not present

## 2017-02-10 DIAGNOSIS — Z0001 Encounter for general adult medical examination with abnormal findings: Secondary | ICD-10-CM | POA: Diagnosis not present

## 2017-02-10 DIAGNOSIS — G2581 Restless legs syndrome: Secondary | ICD-10-CM | POA: Diagnosis not present

## 2017-02-10 DIAGNOSIS — E1165 Type 2 diabetes mellitus with hyperglycemia: Secondary | ICD-10-CM | POA: Diagnosis not present

## 2017-02-10 DIAGNOSIS — E039 Hypothyroidism, unspecified: Secondary | ICD-10-CM | POA: Diagnosis not present

## 2017-02-10 DIAGNOSIS — R69 Illness, unspecified: Secondary | ICD-10-CM | POA: Diagnosis not present

## 2017-02-10 DIAGNOSIS — Z6825 Body mass index (BMI) 25.0-25.9, adult: Secondary | ICD-10-CM | POA: Diagnosis not present

## 2017-02-10 DIAGNOSIS — E1140 Type 2 diabetes mellitus with diabetic neuropathy, unspecified: Secondary | ICD-10-CM | POA: Diagnosis not present

## 2017-02-10 DIAGNOSIS — M10472 Other secondary gout, left ankle and foot: Secondary | ICD-10-CM | POA: Diagnosis not present

## 2017-02-10 DIAGNOSIS — K219 Gastro-esophageal reflux disease without esophagitis: Secondary | ICD-10-CM | POA: Diagnosis not present

## 2017-02-10 DIAGNOSIS — M10471 Other secondary gout, right ankle and foot: Secondary | ICD-10-CM | POA: Diagnosis not present

## 2017-02-10 DIAGNOSIS — I10 Essential (primary) hypertension: Secondary | ICD-10-CM | POA: Diagnosis not present

## 2017-02-10 DIAGNOSIS — E782 Mixed hyperlipidemia: Secondary | ICD-10-CM | POA: Diagnosis not present

## 2017-02-14 DIAGNOSIS — E039 Hypothyroidism, unspecified: Secondary | ICD-10-CM | POA: Diagnosis not present

## 2017-02-14 DIAGNOSIS — R609 Edema, unspecified: Secondary | ICD-10-CM | POA: Diagnosis not present

## 2017-02-14 DIAGNOSIS — Z6824 Body mass index (BMI) 24.0-24.9, adult: Secondary | ICD-10-CM | POA: Diagnosis not present

## 2017-02-14 DIAGNOSIS — Z0001 Encounter for general adult medical examination with abnormal findings: Secondary | ICD-10-CM | POA: Diagnosis not present

## 2017-02-14 DIAGNOSIS — K219 Gastro-esophageal reflux disease without esophagitis: Secondary | ICD-10-CM | POA: Diagnosis not present

## 2017-02-14 DIAGNOSIS — E1165 Type 2 diabetes mellitus with hyperglycemia: Secondary | ICD-10-CM | POA: Diagnosis not present

## 2017-02-14 DIAGNOSIS — Z23 Encounter for immunization: Secondary | ICD-10-CM | POA: Diagnosis not present

## 2017-02-24 DIAGNOSIS — I951 Orthostatic hypotension: Secondary | ICD-10-CM | POA: Diagnosis not present

## 2017-02-24 DIAGNOSIS — K921 Melena: Secondary | ICD-10-CM | POA: Diagnosis not present

## 2017-02-24 DIAGNOSIS — R1310 Dysphagia, unspecified: Secondary | ICD-10-CM | POA: Diagnosis not present

## 2017-02-24 DIAGNOSIS — N179 Acute kidney failure, unspecified: Secondary | ICD-10-CM | POA: Diagnosis not present

## 2017-02-24 DIAGNOSIS — R109 Unspecified abdominal pain: Secondary | ICD-10-CM | POA: Diagnosis not present

## 2017-02-24 DIAGNOSIS — E860 Dehydration: Secondary | ICD-10-CM | POA: Diagnosis not present

## 2017-02-24 DIAGNOSIS — E876 Hypokalemia: Secondary | ICD-10-CM | POA: Diagnosis not present

## 2017-02-24 DIAGNOSIS — R112 Nausea with vomiting, unspecified: Secondary | ICD-10-CM | POA: Diagnosis not present

## 2017-02-24 DIAGNOSIS — R4182 Altered mental status, unspecified: Secondary | ICD-10-CM | POA: Diagnosis not present

## 2017-02-24 DIAGNOSIS — M109 Gout, unspecified: Secondary | ICD-10-CM | POA: Diagnosis not present

## 2017-02-24 DIAGNOSIS — Z86010 Personal history of colonic polyps: Secondary | ICD-10-CM | POA: Diagnosis not present

## 2017-02-24 DIAGNOSIS — N183 Chronic kidney disease, stage 3 (moderate): Secondary | ICD-10-CM | POA: Diagnosis not present

## 2017-02-24 DIAGNOSIS — R55 Syncope and collapse: Secondary | ICD-10-CM | POA: Diagnosis not present

## 2017-02-24 DIAGNOSIS — R1110 Vomiting, unspecified: Secondary | ICD-10-CM | POA: Diagnosis not present

## 2017-02-24 DIAGNOSIS — E1122 Type 2 diabetes mellitus with diabetic chronic kidney disease: Secondary | ICD-10-CM | POA: Diagnosis not present

## 2017-02-24 DIAGNOSIS — B3781 Candidal esophagitis: Secondary | ICD-10-CM | POA: Diagnosis not present

## 2017-02-24 DIAGNOSIS — M25572 Pain in left ankle and joints of left foot: Secondary | ICD-10-CM | POA: Diagnosis not present

## 2017-02-24 DIAGNOSIS — K209 Esophagitis, unspecified: Secondary | ICD-10-CM | POA: Diagnosis not present

## 2017-02-24 DIAGNOSIS — I129 Hypertensive chronic kidney disease with stage 1 through stage 4 chronic kidney disease, or unspecified chronic kidney disease: Secondary | ICD-10-CM | POA: Diagnosis not present

## 2017-03-04 DIAGNOSIS — Z79899 Other long term (current) drug therapy: Secondary | ICD-10-CM | POA: Diagnosis not present

## 2017-03-04 DIAGNOSIS — Z86718 Personal history of other venous thrombosis and embolism: Secondary | ICD-10-CM | POA: Diagnosis not present

## 2017-03-04 DIAGNOSIS — Z85038 Personal history of other malignant neoplasm of large intestine: Secondary | ICD-10-CM | POA: Diagnosis not present

## 2017-03-04 DIAGNOSIS — R29818 Other symptoms and signs involving the nervous system: Secondary | ICD-10-CM | POA: Diagnosis not present

## 2017-03-04 DIAGNOSIS — Z7982 Long term (current) use of aspirin: Secondary | ICD-10-CM | POA: Diagnosis not present

## 2017-03-04 DIAGNOSIS — Z794 Long term (current) use of insulin: Secondary | ICD-10-CM | POA: Diagnosis not present

## 2017-03-04 DIAGNOSIS — R55 Syncope and collapse: Secondary | ICD-10-CM | POA: Diagnosis not present

## 2017-03-04 DIAGNOSIS — R69 Illness, unspecified: Secondary | ICD-10-CM | POA: Diagnosis not present

## 2017-03-04 DIAGNOSIS — K219 Gastro-esophageal reflux disease without esophagitis: Secondary | ICD-10-CM | POA: Diagnosis not present

## 2017-03-04 DIAGNOSIS — E119 Type 2 diabetes mellitus without complications: Secondary | ICD-10-CM | POA: Diagnosis not present

## 2017-03-05 DIAGNOSIS — R69 Illness, unspecified: Secondary | ICD-10-CM | POA: Diagnosis not present

## 2017-03-11 DIAGNOSIS — R42 Dizziness and giddiness: Secondary | ICD-10-CM | POA: Diagnosis not present

## 2017-03-11 DIAGNOSIS — E1140 Type 2 diabetes mellitus with diabetic neuropathy, unspecified: Secondary | ICD-10-CM | POA: Diagnosis not present

## 2017-03-11 DIAGNOSIS — M130 Polyarthritis, unspecified: Secondary | ICD-10-CM | POA: Diagnosis not present

## 2017-03-11 DIAGNOSIS — E039 Hypothyroidism, unspecified: Secondary | ICD-10-CM | POA: Diagnosis not present

## 2017-03-11 DIAGNOSIS — R110 Nausea: Secondary | ICD-10-CM | POA: Diagnosis not present

## 2017-03-11 DIAGNOSIS — E1165 Type 2 diabetes mellitus with hyperglycemia: Secondary | ICD-10-CM | POA: Diagnosis not present

## 2017-03-11 DIAGNOSIS — Z6824 Body mass index (BMI) 24.0-24.9, adult: Secondary | ICD-10-CM | POA: Diagnosis not present

## 2017-03-11 DIAGNOSIS — R634 Abnormal weight loss: Secondary | ICD-10-CM | POA: Diagnosis not present

## 2017-04-23 DIAGNOSIS — E039 Hypothyroidism, unspecified: Secondary | ICD-10-CM | POA: Diagnosis not present

## 2017-04-23 DIAGNOSIS — R630 Anorexia: Secondary | ICD-10-CM | POA: Diagnosis not present

## 2017-04-23 DIAGNOSIS — R5383 Other fatigue: Secondary | ICD-10-CM | POA: Diagnosis not present

## 2017-04-23 DIAGNOSIS — R69 Illness, unspecified: Secondary | ICD-10-CM | POA: Diagnosis not present

## 2017-04-23 DIAGNOSIS — I10 Essential (primary) hypertension: Secondary | ICD-10-CM | POA: Diagnosis not present

## 2017-04-23 DIAGNOSIS — G2581 Restless legs syndrome: Secondary | ICD-10-CM | POA: Diagnosis not present

## 2017-04-23 DIAGNOSIS — E1165 Type 2 diabetes mellitus with hyperglycemia: Secondary | ICD-10-CM | POA: Diagnosis not present

## 2017-04-23 DIAGNOSIS — E1140 Type 2 diabetes mellitus with diabetic neuropathy, unspecified: Secondary | ICD-10-CM | POA: Diagnosis not present

## 2017-04-23 DIAGNOSIS — M1990 Unspecified osteoarthritis, unspecified site: Secondary | ICD-10-CM | POA: Diagnosis not present

## 2017-04-23 DIAGNOSIS — R609 Edema, unspecified: Secondary | ICD-10-CM | POA: Diagnosis not present

## 2017-04-23 DIAGNOSIS — K219 Gastro-esophageal reflux disease without esophagitis: Secondary | ICD-10-CM | POA: Diagnosis not present

## 2017-04-23 DIAGNOSIS — E782 Mixed hyperlipidemia: Secondary | ICD-10-CM | POA: Diagnosis not present

## 2017-04-24 DIAGNOSIS — Z6824 Body mass index (BMI) 24.0-24.9, adult: Secondary | ICD-10-CM | POA: Diagnosis not present

## 2017-04-24 DIAGNOSIS — R42 Dizziness and giddiness: Secondary | ICD-10-CM | POA: Diagnosis not present

## 2017-04-24 DIAGNOSIS — K219 Gastro-esophageal reflux disease without esophagitis: Secondary | ICD-10-CM | POA: Diagnosis not present

## 2017-04-24 DIAGNOSIS — E1165 Type 2 diabetes mellitus with hyperglycemia: Secondary | ICD-10-CM | POA: Diagnosis not present

## 2017-04-24 DIAGNOSIS — E039 Hypothyroidism, unspecified: Secondary | ICD-10-CM | POA: Diagnosis not present

## 2017-04-24 DIAGNOSIS — R634 Abnormal weight loss: Secondary | ICD-10-CM | POA: Diagnosis not present

## 2017-04-24 DIAGNOSIS — G2581 Restless legs syndrome: Secondary | ICD-10-CM | POA: Diagnosis not present

## 2017-04-24 DIAGNOSIS — E1140 Type 2 diabetes mellitus with diabetic neuropathy, unspecified: Secondary | ICD-10-CM | POA: Diagnosis not present

## 2017-05-23 DIAGNOSIS — E119 Type 2 diabetes mellitus without complications: Secondary | ICD-10-CM | POA: Diagnosis not present

## 2017-05-23 DIAGNOSIS — M542 Cervicalgia: Secondary | ICD-10-CM | POA: Diagnosis not present

## 2017-05-23 DIAGNOSIS — R69 Illness, unspecified: Secondary | ICD-10-CM | POA: Diagnosis not present

## 2017-05-23 DIAGNOSIS — Z85038 Personal history of other malignant neoplasm of large intestine: Secondary | ICD-10-CM | POA: Diagnosis not present

## 2017-05-23 DIAGNOSIS — Z7982 Long term (current) use of aspirin: Secondary | ICD-10-CM | POA: Diagnosis not present

## 2017-05-23 DIAGNOSIS — K219 Gastro-esophageal reflux disease without esophagitis: Secondary | ICD-10-CM | POA: Diagnosis not present

## 2017-05-23 DIAGNOSIS — G2581 Restless legs syndrome: Secondary | ICD-10-CM | POA: Diagnosis not present

## 2017-05-23 DIAGNOSIS — M5412 Radiculopathy, cervical region: Secondary | ICD-10-CM | POA: Diagnosis not present

## 2017-05-23 DIAGNOSIS — Z794 Long term (current) use of insulin: Secondary | ICD-10-CM | POA: Diagnosis not present

## 2017-05-23 DIAGNOSIS — Z79899 Other long term (current) drug therapy: Secondary | ICD-10-CM | POA: Diagnosis not present

## 2017-05-23 DIAGNOSIS — Z86718 Personal history of other venous thrombosis and embolism: Secondary | ICD-10-CM | POA: Diagnosis not present

## 2017-05-27 DIAGNOSIS — M542 Cervicalgia: Secondary | ICD-10-CM | POA: Diagnosis not present

## 2017-05-28 DIAGNOSIS — M542 Cervicalgia: Secondary | ICD-10-CM | POA: Diagnosis not present

## 2017-05-28 DIAGNOSIS — Z6824 Body mass index (BMI) 24.0-24.9, adult: Secondary | ICD-10-CM | POA: Diagnosis not present

## 2017-06-10 DIAGNOSIS — M542 Cervicalgia: Secondary | ICD-10-CM | POA: Diagnosis not present

## 2017-06-10 DIAGNOSIS — R293 Abnormal posture: Secondary | ICD-10-CM | POA: Diagnosis not present

## 2017-06-10 DIAGNOSIS — M6281 Muscle weakness (generalized): Secondary | ICD-10-CM | POA: Diagnosis not present

## 2017-06-10 DIAGNOSIS — M2560 Stiffness of unspecified joint, not elsewhere classified: Secondary | ICD-10-CM | POA: Diagnosis not present

## 2017-06-10 DIAGNOSIS — M5412 Radiculopathy, cervical region: Secondary | ICD-10-CM | POA: Diagnosis not present

## 2017-06-21 DIAGNOSIS — R69 Illness, unspecified: Secondary | ICD-10-CM | POA: Diagnosis not present

## 2017-06-25 DIAGNOSIS — M2560 Stiffness of unspecified joint, not elsewhere classified: Secondary | ICD-10-CM | POA: Diagnosis not present

## 2017-06-25 DIAGNOSIS — R293 Abnormal posture: Secondary | ICD-10-CM | POA: Diagnosis not present

## 2017-06-25 DIAGNOSIS — M6281 Muscle weakness (generalized): Secondary | ICD-10-CM | POA: Diagnosis not present

## 2017-06-25 DIAGNOSIS — M542 Cervicalgia: Secondary | ICD-10-CM | POA: Diagnosis not present

## 2017-06-25 DIAGNOSIS — M5412 Radiculopathy, cervical region: Secondary | ICD-10-CM | POA: Diagnosis not present

## 2017-06-26 DIAGNOSIS — M6281 Muscle weakness (generalized): Secondary | ICD-10-CM | POA: Diagnosis not present

## 2017-06-26 DIAGNOSIS — M5412 Radiculopathy, cervical region: Secondary | ICD-10-CM | POA: Diagnosis not present

## 2017-06-26 DIAGNOSIS — M2560 Stiffness of unspecified joint, not elsewhere classified: Secondary | ICD-10-CM | POA: Diagnosis not present

## 2017-06-26 DIAGNOSIS — R293 Abnormal posture: Secondary | ICD-10-CM | POA: Diagnosis not present

## 2017-06-26 DIAGNOSIS — M542 Cervicalgia: Secondary | ICD-10-CM | POA: Diagnosis not present

## 2017-07-02 DIAGNOSIS — M6281 Muscle weakness (generalized): Secondary | ICD-10-CM | POA: Diagnosis not present

## 2017-07-02 DIAGNOSIS — M542 Cervicalgia: Secondary | ICD-10-CM | POA: Diagnosis not present

## 2017-07-02 DIAGNOSIS — M5412 Radiculopathy, cervical region: Secondary | ICD-10-CM | POA: Diagnosis not present

## 2017-07-02 DIAGNOSIS — R293 Abnormal posture: Secondary | ICD-10-CM | POA: Diagnosis not present

## 2017-07-02 DIAGNOSIS — M2560 Stiffness of unspecified joint, not elsewhere classified: Secondary | ICD-10-CM | POA: Diagnosis not present

## 2017-07-08 DIAGNOSIS — R293 Abnormal posture: Secondary | ICD-10-CM | POA: Diagnosis not present

## 2017-07-08 DIAGNOSIS — M542 Cervicalgia: Secondary | ICD-10-CM | POA: Diagnosis not present

## 2017-07-08 DIAGNOSIS — M5412 Radiculopathy, cervical region: Secondary | ICD-10-CM | POA: Diagnosis not present

## 2017-07-08 DIAGNOSIS — M2560 Stiffness of unspecified joint, not elsewhere classified: Secondary | ICD-10-CM | POA: Diagnosis not present

## 2017-07-08 DIAGNOSIS — M6281 Muscle weakness (generalized): Secondary | ICD-10-CM | POA: Diagnosis not present

## 2017-07-11 DIAGNOSIS — M5412 Radiculopathy, cervical region: Secondary | ICD-10-CM | POA: Diagnosis not present

## 2017-07-11 DIAGNOSIS — M6281 Muscle weakness (generalized): Secondary | ICD-10-CM | POA: Diagnosis not present

## 2017-07-11 DIAGNOSIS — R293 Abnormal posture: Secondary | ICD-10-CM | POA: Diagnosis not present

## 2017-07-11 DIAGNOSIS — M542 Cervicalgia: Secondary | ICD-10-CM | POA: Diagnosis not present

## 2017-07-11 DIAGNOSIS — M2560 Stiffness of unspecified joint, not elsewhere classified: Secondary | ICD-10-CM | POA: Diagnosis not present

## 2017-07-16 DIAGNOSIS — M5412 Radiculopathy, cervical region: Secondary | ICD-10-CM | POA: Diagnosis not present

## 2017-07-16 DIAGNOSIS — M6281 Muscle weakness (generalized): Secondary | ICD-10-CM | POA: Diagnosis not present

## 2017-07-16 DIAGNOSIS — M542 Cervicalgia: Secondary | ICD-10-CM | POA: Diagnosis not present

## 2017-07-16 DIAGNOSIS — R293 Abnormal posture: Secondary | ICD-10-CM | POA: Diagnosis not present

## 2017-07-16 DIAGNOSIS — M2560 Stiffness of unspecified joint, not elsewhere classified: Secondary | ICD-10-CM | POA: Diagnosis not present

## 2017-07-23 DIAGNOSIS — M2560 Stiffness of unspecified joint, not elsewhere classified: Secondary | ICD-10-CM | POA: Diagnosis not present

## 2017-07-23 DIAGNOSIS — M5412 Radiculopathy, cervical region: Secondary | ICD-10-CM | POA: Diagnosis not present

## 2017-07-23 DIAGNOSIS — M542 Cervicalgia: Secondary | ICD-10-CM | POA: Diagnosis not present

## 2017-07-23 DIAGNOSIS — M6281 Muscle weakness (generalized): Secondary | ICD-10-CM | POA: Diagnosis not present

## 2017-07-23 DIAGNOSIS — R293 Abnormal posture: Secondary | ICD-10-CM | POA: Diagnosis not present

## 2017-07-29 DIAGNOSIS — Z794 Long term (current) use of insulin: Secondary | ICD-10-CM | POA: Diagnosis not present

## 2017-07-29 DIAGNOSIS — R51 Headache: Secondary | ICD-10-CM | POA: Diagnosis not present

## 2017-07-29 DIAGNOSIS — R69 Illness, unspecified: Secondary | ICD-10-CM | POA: Diagnosis not present

## 2017-07-29 DIAGNOSIS — Z86718 Personal history of other venous thrombosis and embolism: Secondary | ICD-10-CM | POA: Diagnosis not present

## 2017-07-29 DIAGNOSIS — Z79899 Other long term (current) drug therapy: Secondary | ICD-10-CM | POA: Diagnosis not present

## 2017-07-29 DIAGNOSIS — Z7982 Long term (current) use of aspirin: Secondary | ICD-10-CM | POA: Diagnosis not present

## 2017-07-29 DIAGNOSIS — E119 Type 2 diabetes mellitus without complications: Secondary | ICD-10-CM | POA: Diagnosis not present

## 2017-07-29 DIAGNOSIS — K219 Gastro-esophageal reflux disease without esophagitis: Secondary | ICD-10-CM | POA: Diagnosis not present

## 2017-07-29 DIAGNOSIS — R109 Unspecified abdominal pain: Secondary | ICD-10-CM | POA: Diagnosis not present

## 2017-07-29 DIAGNOSIS — I160 Hypertensive urgency: Secondary | ICD-10-CM | POA: Diagnosis not present

## 2017-07-29 DIAGNOSIS — R1084 Generalized abdominal pain: Secondary | ICD-10-CM | POA: Diagnosis not present

## 2017-07-29 DIAGNOSIS — I10 Essential (primary) hypertension: Secondary | ICD-10-CM | POA: Diagnosis not present

## 2017-07-31 DIAGNOSIS — Z6825 Body mass index (BMI) 25.0-25.9, adult: Secondary | ICD-10-CM | POA: Diagnosis not present

## 2017-07-31 DIAGNOSIS — I10 Essential (primary) hypertension: Secondary | ICD-10-CM | POA: Diagnosis not present

## 2017-08-04 DIAGNOSIS — R42 Dizziness and giddiness: Secondary | ICD-10-CM | POA: Diagnosis not present

## 2017-08-04 DIAGNOSIS — E1165 Type 2 diabetes mellitus with hyperglycemia: Secondary | ICD-10-CM | POA: Diagnosis not present

## 2017-08-04 DIAGNOSIS — K219 Gastro-esophageal reflux disease without esophagitis: Secondary | ICD-10-CM | POA: Diagnosis not present

## 2017-08-04 DIAGNOSIS — R739 Hyperglycemia, unspecified: Secondary | ICD-10-CM | POA: Diagnosis not present

## 2017-08-04 DIAGNOSIS — E1140 Type 2 diabetes mellitus with diabetic neuropathy, unspecified: Secondary | ICD-10-CM | POA: Diagnosis not present

## 2017-08-04 DIAGNOSIS — R319 Hematuria, unspecified: Secondary | ICD-10-CM | POA: Diagnosis not present

## 2017-08-04 DIAGNOSIS — I10 Essential (primary) hypertension: Secondary | ICD-10-CM | POA: Diagnosis not present

## 2017-08-04 DIAGNOSIS — R634 Abnormal weight loss: Secondary | ICD-10-CM | POA: Diagnosis not present

## 2017-08-04 DIAGNOSIS — R5383 Other fatigue: Secondary | ICD-10-CM | POA: Diagnosis not present

## 2017-08-04 DIAGNOSIS — E039 Hypothyroidism, unspecified: Secondary | ICD-10-CM | POA: Diagnosis not present

## 2017-08-05 DIAGNOSIS — Z6825 Body mass index (BMI) 25.0-25.9, adult: Secondary | ICD-10-CM | POA: Diagnosis not present

## 2017-08-05 DIAGNOSIS — R42 Dizziness and giddiness: Secondary | ICD-10-CM | POA: Diagnosis not present

## 2017-08-05 DIAGNOSIS — E1140 Type 2 diabetes mellitus with diabetic neuropathy, unspecified: Secondary | ICD-10-CM | POA: Diagnosis not present

## 2017-08-05 DIAGNOSIS — I10 Essential (primary) hypertension: Secondary | ICD-10-CM | POA: Diagnosis not present

## 2017-08-05 DIAGNOSIS — R69 Illness, unspecified: Secondary | ICD-10-CM | POA: Diagnosis not present

## 2017-08-12 DIAGNOSIS — Z1231 Encounter for screening mammogram for malignant neoplasm of breast: Secondary | ICD-10-CM | POA: Diagnosis not present

## 2017-08-12 DIAGNOSIS — M6281 Muscle weakness (generalized): Secondary | ICD-10-CM | POA: Diagnosis not present

## 2017-08-12 DIAGNOSIS — M542 Cervicalgia: Secondary | ICD-10-CM | POA: Diagnosis not present

## 2017-08-12 DIAGNOSIS — M5412 Radiculopathy, cervical region: Secondary | ICD-10-CM | POA: Diagnosis not present

## 2017-08-12 DIAGNOSIS — R293 Abnormal posture: Secondary | ICD-10-CM | POA: Diagnosis not present

## 2017-08-12 DIAGNOSIS — M2560 Stiffness of unspecified joint, not elsewhere classified: Secondary | ICD-10-CM | POA: Diagnosis not present

## 2017-08-14 DIAGNOSIS — M2560 Stiffness of unspecified joint, not elsewhere classified: Secondary | ICD-10-CM | POA: Diagnosis not present

## 2017-08-14 DIAGNOSIS — M542 Cervicalgia: Secondary | ICD-10-CM | POA: Diagnosis not present

## 2017-08-14 DIAGNOSIS — M6281 Muscle weakness (generalized): Secondary | ICD-10-CM | POA: Diagnosis not present

## 2017-08-14 DIAGNOSIS — R293 Abnormal posture: Secondary | ICD-10-CM | POA: Diagnosis not present

## 2017-08-14 DIAGNOSIS — M5412 Radiculopathy, cervical region: Secondary | ICD-10-CM | POA: Diagnosis not present

## 2017-08-20 DIAGNOSIS — M6281 Muscle weakness (generalized): Secondary | ICD-10-CM | POA: Diagnosis not present

## 2017-08-20 DIAGNOSIS — M2560 Stiffness of unspecified joint, not elsewhere classified: Secondary | ICD-10-CM | POA: Diagnosis not present

## 2017-08-20 DIAGNOSIS — M5412 Radiculopathy, cervical region: Secondary | ICD-10-CM | POA: Diagnosis not present

## 2017-08-20 DIAGNOSIS — R293 Abnormal posture: Secondary | ICD-10-CM | POA: Diagnosis not present

## 2017-08-20 DIAGNOSIS — M542 Cervicalgia: Secondary | ICD-10-CM | POA: Diagnosis not present

## 2017-08-22 DIAGNOSIS — M542 Cervicalgia: Secondary | ICD-10-CM | POA: Diagnosis not present

## 2017-08-22 DIAGNOSIS — R293 Abnormal posture: Secondary | ICD-10-CM | POA: Diagnosis not present

## 2017-08-22 DIAGNOSIS — M5412 Radiculopathy, cervical region: Secondary | ICD-10-CM | POA: Diagnosis not present

## 2017-08-22 DIAGNOSIS — M6281 Muscle weakness (generalized): Secondary | ICD-10-CM | POA: Diagnosis not present

## 2017-08-22 DIAGNOSIS — M2560 Stiffness of unspecified joint, not elsewhere classified: Secondary | ICD-10-CM | POA: Diagnosis not present

## 2017-08-25 DIAGNOSIS — M2560 Stiffness of unspecified joint, not elsewhere classified: Secondary | ICD-10-CM | POA: Diagnosis not present

## 2017-08-25 DIAGNOSIS — M5412 Radiculopathy, cervical region: Secondary | ICD-10-CM | POA: Diagnosis not present

## 2017-08-25 DIAGNOSIS — M542 Cervicalgia: Secondary | ICD-10-CM | POA: Diagnosis not present

## 2017-08-25 DIAGNOSIS — M6281 Muscle weakness (generalized): Secondary | ICD-10-CM | POA: Diagnosis not present

## 2017-08-25 DIAGNOSIS — R293 Abnormal posture: Secondary | ICD-10-CM | POA: Diagnosis not present

## 2017-09-02 DIAGNOSIS — M5412 Radiculopathy, cervical region: Secondary | ICD-10-CM | POA: Diagnosis not present

## 2017-09-02 DIAGNOSIS — M2560 Stiffness of unspecified joint, not elsewhere classified: Secondary | ICD-10-CM | POA: Diagnosis not present

## 2017-09-02 DIAGNOSIS — M542 Cervicalgia: Secondary | ICD-10-CM | POA: Diagnosis not present

## 2017-09-02 DIAGNOSIS — M6281 Muscle weakness (generalized): Secondary | ICD-10-CM | POA: Diagnosis not present

## 2017-09-02 DIAGNOSIS — R293 Abnormal posture: Secondary | ICD-10-CM | POA: Diagnosis not present

## 2017-09-03 DIAGNOSIS — H401132 Primary open-angle glaucoma, bilateral, moderate stage: Secondary | ICD-10-CM | POA: Diagnosis not present

## 2017-09-17 DIAGNOSIS — R42 Dizziness and giddiness: Secondary | ICD-10-CM | POA: Diagnosis not present

## 2017-09-17 DIAGNOSIS — Z6825 Body mass index (BMI) 25.0-25.9, adult: Secondary | ICD-10-CM | POA: Diagnosis not present

## 2017-09-17 DIAGNOSIS — R300 Dysuria: Secondary | ICD-10-CM | POA: Diagnosis not present

## 2017-09-23 DIAGNOSIS — M2560 Stiffness of unspecified joint, not elsewhere classified: Secondary | ICD-10-CM | POA: Diagnosis not present

## 2017-09-23 DIAGNOSIS — M5412 Radiculopathy, cervical region: Secondary | ICD-10-CM | POA: Diagnosis not present

## 2017-09-23 DIAGNOSIS — M6281 Muscle weakness (generalized): Secondary | ICD-10-CM | POA: Diagnosis not present

## 2017-09-23 DIAGNOSIS — M542 Cervicalgia: Secondary | ICD-10-CM | POA: Diagnosis not present

## 2017-09-23 DIAGNOSIS — R293 Abnormal posture: Secondary | ICD-10-CM | POA: Diagnosis not present

## 2017-09-24 ENCOUNTER — Encounter (INDEPENDENT_AMBULATORY_CARE_PROVIDER_SITE_OTHER): Payer: Self-pay | Admitting: Internal Medicine

## 2017-09-24 ENCOUNTER — Inpatient Hospital Stay (HOSPITAL_COMMUNITY)
Admission: EM | Admit: 2017-09-24 | Discharge: 2017-10-01 | DRG: 439 | Disposition: A | Discharge status: Home / Self Care | Payer: Medicare HMO | Attending: Internal Medicine | Admitting: Internal Medicine

## 2017-09-24 ENCOUNTER — Emergency Department (HOSPITAL_COMMUNITY): Payer: Medicare HMO

## 2017-09-24 ENCOUNTER — Ambulatory Visit (INDEPENDENT_AMBULATORY_CARE_PROVIDER_SITE_OTHER): Payer: Medicare HMO | Admitting: Internal Medicine

## 2017-09-24 ENCOUNTER — Encounter (HOSPITAL_COMMUNITY): Payer: Self-pay | Admitting: Emergency Medicine

## 2017-09-24 ENCOUNTER — Other Ambulatory Visit: Payer: Self-pay

## 2017-09-24 VITALS — BP 152/80 | HR 80 | Temp 98.3°F | Ht 66.0 in | Wt 159.8 lb

## 2017-09-24 DIAGNOSIS — M1A9XX0 Chronic gout, unspecified, without tophus (tophi): Secondary | ICD-10-CM | POA: Diagnosis present

## 2017-09-24 DIAGNOSIS — R1314 Dysphagia, pharyngoesophageal phase: Secondary | ICD-10-CM | POA: Diagnosis present

## 2017-09-24 DIAGNOSIS — N179 Acute kidney failure, unspecified: Secondary | ICD-10-CM | POA: Diagnosis not present

## 2017-09-24 DIAGNOSIS — K219 Gastro-esophageal reflux disease without esophagitis: Secondary | ICD-10-CM | POA: Diagnosis present

## 2017-09-24 DIAGNOSIS — N182 Chronic kidney disease, stage 2 (mild): Secondary | ICD-10-CM | POA: Diagnosis present

## 2017-09-24 DIAGNOSIS — F19930 Other psychoactive substance use, unspecified with withdrawal, uncomplicated: Secondary | ICD-10-CM | POA: Diagnosis not present

## 2017-09-24 DIAGNOSIS — R1310 Dysphagia, unspecified: Secondary | ICD-10-CM | POA: Diagnosis not present

## 2017-09-24 DIAGNOSIS — M109 Gout, unspecified: Secondary | ICD-10-CM

## 2017-09-24 DIAGNOSIS — R52 Pain, unspecified: Secondary | ICD-10-CM

## 2017-09-24 DIAGNOSIS — I129 Hypertensive chronic kidney disease with stage 1 through stage 4 chronic kidney disease, or unspecified chronic kidney disease: Secondary | ICD-10-CM | POA: Diagnosis present

## 2017-09-24 DIAGNOSIS — R1084 Generalized abdominal pain: Secondary | ICD-10-CM

## 2017-09-24 DIAGNOSIS — F419 Anxiety disorder, unspecified: Secondary | ICD-10-CM

## 2017-09-24 DIAGNOSIS — R112 Nausea with vomiting, unspecified: Secondary | ICD-10-CM

## 2017-09-24 DIAGNOSIS — E1121 Type 2 diabetes mellitus with diabetic nephropathy: Secondary | ICD-10-CM | POA: Diagnosis present

## 2017-09-24 DIAGNOSIS — T18108A Unspecified foreign body in esophagus causing other injury, initial encounter: Secondary | ICD-10-CM

## 2017-09-24 DIAGNOSIS — R932 Abnormal findings on diagnostic imaging of liver and biliary tract: Secondary | ICD-10-CM | POA: Diagnosis not present

## 2017-09-24 DIAGNOSIS — H409 Unspecified glaucoma: Secondary | ICD-10-CM | POA: Diagnosis present

## 2017-09-24 DIAGNOSIS — K859 Acute pancreatitis without necrosis or infection, unspecified: Principal | ICD-10-CM | POA: Diagnosis present

## 2017-09-24 DIAGNOSIS — R69 Illness, unspecified: Secondary | ICD-10-CM | POA: Diagnosis not present

## 2017-09-24 DIAGNOSIS — Q394 Esophageal web: Secondary | ICD-10-CM | POA: Diagnosis not present

## 2017-09-24 DIAGNOSIS — Z0189 Encounter for other specified special examinations: Secondary | ICD-10-CM

## 2017-09-24 DIAGNOSIS — R0989 Other specified symptoms and signs involving the circulatory and respiratory systems: Secondary | ICD-10-CM

## 2017-09-24 DIAGNOSIS — K449 Diaphragmatic hernia without obstruction or gangrene: Secondary | ICD-10-CM | POA: Diagnosis present

## 2017-09-24 DIAGNOSIS — R1013 Epigastric pain: Secondary | ICD-10-CM | POA: Diagnosis not present

## 2017-09-24 DIAGNOSIS — K5909 Other constipation: Secondary | ICD-10-CM | POA: Diagnosis present

## 2017-09-24 DIAGNOSIS — R251 Tremor, unspecified: Secondary | ICD-10-CM | POA: Diagnosis not present

## 2017-09-24 DIAGNOSIS — Z85038 Personal history of other malignant neoplasm of large intestine: Secondary | ICD-10-CM | POA: Diagnosis present

## 2017-09-24 DIAGNOSIS — I10 Essential (primary) hypertension: Secondary | ICD-10-CM | POA: Diagnosis not present

## 2017-09-24 DIAGNOSIS — Z885 Allergy status to narcotic agent status: Secondary | ICD-10-CM

## 2017-09-24 DIAGNOSIS — K222 Esophageal obstruction: Secondary | ICD-10-CM | POA: Diagnosis not present

## 2017-09-24 DIAGNOSIS — Z7982 Long term (current) use of aspirin: Secondary | ICD-10-CM

## 2017-09-24 DIAGNOSIS — Z881 Allergy status to other antibiotic agents status: Secondary | ICD-10-CM

## 2017-09-24 DIAGNOSIS — K317 Polyp of stomach and duodenum: Secondary | ICD-10-CM | POA: Diagnosis present

## 2017-09-24 DIAGNOSIS — E1165 Type 2 diabetes mellitus with hyperglycemia: Secondary | ICD-10-CM | POA: Diagnosis present

## 2017-09-24 DIAGNOSIS — E119 Type 2 diabetes mellitus without complications: Secondary | ICD-10-CM

## 2017-09-24 DIAGNOSIS — N281 Cyst of kidney, acquired: Secondary | ICD-10-CM | POA: Diagnosis not present

## 2017-09-24 DIAGNOSIS — Z9049 Acquired absence of other specified parts of digestive tract: Secondary | ICD-10-CM

## 2017-09-24 DIAGNOSIS — Z794 Long term (current) use of insulin: Secondary | ICD-10-CM

## 2017-09-24 DIAGNOSIS — I252 Old myocardial infarction: Secondary | ICD-10-CM

## 2017-09-24 DIAGNOSIS — K210 Gastro-esophageal reflux disease with esophagitis: Secondary | ICD-10-CM | POA: Diagnosis present

## 2017-09-24 DIAGNOSIS — R109 Unspecified abdominal pain: Secondary | ICD-10-CM | POA: Diagnosis not present

## 2017-09-24 DIAGNOSIS — M1990 Unspecified osteoarthritis, unspecified site: Secondary | ICD-10-CM | POA: Diagnosis present

## 2017-09-24 DIAGNOSIS — F458 Other somatoform disorders: Secondary | ICD-10-CM | POA: Diagnosis not present

## 2017-09-24 DIAGNOSIS — G2581 Restless legs syndrome: Secondary | ICD-10-CM | POA: Diagnosis present

## 2017-09-24 DIAGNOSIS — K209 Esophagitis, unspecified: Secondary | ICD-10-CM | POA: Diagnosis not present

## 2017-09-24 DIAGNOSIS — K2210 Ulcer of esophagus without bleeding: Secondary | ICD-10-CM | POA: Diagnosis present

## 2017-09-24 DIAGNOSIS — Z87442 Personal history of urinary calculi: Secondary | ICD-10-CM

## 2017-09-24 DIAGNOSIS — K838 Other specified diseases of biliary tract: Secondary | ICD-10-CM | POA: Diagnosis not present

## 2017-09-24 DIAGNOSIS — R198 Other specified symptoms and signs involving the digestive system and abdomen: Secondary | ICD-10-CM

## 2017-09-24 HISTORY — DX: Dysphagia, unspecified: R13.10

## 2017-09-24 HISTORY — DX: Other constipation: K59.09

## 2017-09-24 LAB — CBC WITH DIFFERENTIAL/PLATELET
BASOS ABS: 0 10*3/uL (ref 0.0–0.1)
BASOS PCT: 1 %
EOS ABS: 0 10*3/uL (ref 0.0–0.7)
Eosinophils Relative: 1 %
HEMATOCRIT: 38.4 % (ref 36.0–46.0)
HEMOGLOBIN: 12.7 g/dL (ref 12.0–15.0)
Lymphocytes Relative: 49 %
Lymphs Abs: 2.3 10*3/uL (ref 0.7–4.0)
MCH: 29.5 pg (ref 26.0–34.0)
MCHC: 33.1 g/dL (ref 30.0–36.0)
MCV: 89.3 fL (ref 78.0–100.0)
Monocytes Absolute: 0.5 10*3/uL (ref 0.1–1.0)
Monocytes Relative: 10 %
NEUTROS PCT: 39 %
Neutro Abs: 1.9 10*3/uL (ref 1.7–7.7)
Platelets: 197 10*3/uL (ref 150–400)
RBC: 4.3 MIL/uL (ref 3.87–5.11)
RDW: 12.7 % (ref 11.5–15.5)
WBC: 4.7 10*3/uL (ref 4.0–10.5)

## 2017-09-24 LAB — GLUCOSE, CAPILLARY
GLUCOSE-CAPILLARY: 118 mg/dL — AB (ref 65–99)
Glucose-Capillary: 101 mg/dL — ABNORMAL HIGH (ref 65–99)

## 2017-09-24 LAB — COMPREHENSIVE METABOLIC PANEL
ALBUMIN: 3.8 g/dL (ref 3.5–5.0)
ALT: 21 U/L (ref 14–54)
ANION GAP: 8 (ref 5–15)
AST: 34 U/L (ref 15–41)
Alkaline Phosphatase: 95 U/L (ref 38–126)
BUN: 11 mg/dL (ref 6–20)
CHLORIDE: 101 mmol/L (ref 101–111)
CO2: 29 mmol/L (ref 22–32)
Calcium: 9.7 mg/dL (ref 8.9–10.3)
Creatinine, Ser: 1.18 mg/dL — ABNORMAL HIGH (ref 0.44–1.00)
GFR calc non Af Amer: 41 mL/min — ABNORMAL LOW (ref >60)
GFR, EST AFRICAN AMERICAN: 48 mL/min — AB (ref >60)
Glucose, Bld: 130 mg/dL — ABNORMAL HIGH (ref 65–99)
POTASSIUM: 3.7 mmol/L (ref 3.5–5.1)
SODIUM: 138 mmol/L (ref 135–145)
Total Bilirubin: 1.2 mg/dL (ref 0.3–1.2)
Total Protein: 7.4 g/dL (ref 6.5–8.1)

## 2017-09-24 LAB — LIPASE, BLOOD: Lipase: 276 U/L — ABNORMAL HIGH (ref 11–51)

## 2017-09-24 LAB — TROPONIN I

## 2017-09-24 LAB — LACTIC ACID, PLASMA: LACTIC ACID, VENOUS: 1.3 mmol/L (ref 0.5–1.9)

## 2017-09-24 LAB — URINALYSIS, ROUTINE W REFLEX MICROSCOPIC
Bilirubin Urine: NEGATIVE
GLUCOSE, UA: NEGATIVE mg/dL
Hgb urine dipstick: NEGATIVE
Ketones, ur: NEGATIVE mg/dL
LEUKOCYTES UA: NEGATIVE
NITRITE: NEGATIVE
PH: 7 (ref 5.0–8.0)
Protein, ur: NEGATIVE mg/dL
SPECIFIC GRAVITY, URINE: 1.016 (ref 1.005–1.030)

## 2017-09-24 LAB — I-STAT CG4 LACTIC ACID, ED: Lactic Acid, Venous: 1.51 mmol/L (ref 0.5–1.9)

## 2017-09-24 MED ORDER — SODIUM CHLORIDE 0.9 % IV SOLN
INTRAVENOUS | Status: DC
  Administered 2017-09-24: 14:00:00 via INTRAVENOUS

## 2017-09-24 MED ORDER — LACTATED RINGERS IV SOLN
INTRAVENOUS | Status: DC
  Administered 2017-09-24 – 2017-09-25 (×2): via INTRAVENOUS

## 2017-09-24 MED ORDER — INSULIN ASPART 100 UNIT/ML ~~LOC~~ SOLN
4.0000 [IU] | Freq: Three times a day (TID) | SUBCUTANEOUS | Status: DC
  Administered 2017-09-27: 4 [IU] via SUBCUTANEOUS
  Administered 2017-09-30: 6 [IU] via SUBCUTANEOUS
  Administered 2017-10-01 (×2): 4 [IU] via SUBCUTANEOUS
  Administered 2017-10-01: 6 [IU] via SUBCUTANEOUS

## 2017-09-24 MED ORDER — IOPAMIDOL (ISOVUE-300) INJECTION 61%
100.0000 mL | Freq: Once | INTRAVENOUS | Status: AC | PRN
  Administered 2017-09-24: 100 mL via INTRAVENOUS

## 2017-09-24 MED ORDER — ENOXAPARIN SODIUM 40 MG/0.4ML ~~LOC~~ SOLN
40.0000 mg | SUBCUTANEOUS | Status: DC
  Filled 2017-09-24: qty 0.4

## 2017-09-24 MED ORDER — ASPIRIN EC 81 MG PO TBEC
81.0000 mg | DELAYED_RELEASE_TABLET | Freq: Every day | ORAL | Status: DC
  Administered 2017-09-25 – 2017-09-26 (×2): 81 mg via ORAL
  Filled 2017-09-24 (×2): qty 1

## 2017-09-24 MED ORDER — PANTOPRAZOLE SODIUM 40 MG PO TBEC
40.0000 mg | DELAYED_RELEASE_TABLET | Freq: Every day | ORAL | Status: DC
  Administered 2017-09-25 – 2017-09-26 (×2): 40 mg via ORAL
  Filled 2017-09-24 (×2): qty 1

## 2017-09-24 MED ORDER — ONDANSETRON HCL 4 MG/2ML IJ SOLN
4.0000 mg | INTRAMUSCULAR | Status: AC | PRN
  Administered 2017-09-24 (×2): 4 mg via INTRAVENOUS
  Filled 2017-09-24 (×2): qty 2

## 2017-09-24 MED ORDER — ONDANSETRON HCL 4 MG/2ML IJ SOLN
4.0000 mg | Freq: Four times a day (QID) | INTRAMUSCULAR | Status: DC | PRN
Start: 2017-09-24 — End: 2017-10-01
  Administered 2017-09-25 – 2017-09-29 (×12): 4 mg via INTRAVENOUS
  Filled 2017-09-24 (×12): qty 2

## 2017-09-24 MED ORDER — PROMETHAZINE HCL 25 MG/ML IJ SOLN
12.5000 mg | Freq: Four times a day (QID) | INTRAMUSCULAR | Status: DC | PRN
  Administered 2017-09-24: 12.5 mg via INTRAVENOUS
  Filled 2017-09-24: qty 1

## 2017-09-24 MED ORDER — GABAPENTIN 300 MG PO CAPS
300.0000 mg | ORAL_CAPSULE | Freq: Every day | ORAL | Status: DC
  Administered 2017-09-24 – 2017-09-30 (×7): 300 mg via ORAL
  Filled 2017-09-24 (×7): qty 1

## 2017-09-24 MED ORDER — MORPHINE SULFATE (PF) 4 MG/ML IV SOLN
4.0000 mg | INTRAVENOUS | Status: AC | PRN
  Administered 2017-09-24 (×2): 4 mg via INTRAVENOUS
  Filled 2017-09-24 (×2): qty 1

## 2017-09-24 MED ORDER — HYDRALAZINE HCL 20 MG/ML IJ SOLN
10.0000 mg | INTRAMUSCULAR | Status: DC | PRN
  Administered 2017-09-24: 10 mg via INTRAVENOUS
  Filled 2017-09-24: qty 1

## 2017-09-24 MED ORDER — IOPAMIDOL (ISOVUE-300) INJECTION 61%: 75.0000 mL | Freq: Once | INTRAVENOUS | Status: DC | PRN

## 2017-09-24 MED ORDER — COLCHICINE 0.6 MG PO TABS: 0.6000 mg | ORAL_TABLET | ORAL | Status: DC | PRN

## 2017-09-24 MED ORDER — MECLIZINE HCL 12.5 MG PO TABS
25.0000 mg | ORAL_TABLET | Freq: Three times a day (TID) | ORAL | Status: DC | PRN
Start: 2017-09-24 — End: 2017-10-01

## 2017-09-24 MED ORDER — INSULIN GLARGINE 100 UNIT/ML ~~LOC~~ SOLN
5.0000 [IU] | Freq: Every day | SUBCUTANEOUS | Status: DC
  Administered 2017-09-26 – 2017-09-30 (×5): 5 [IU] via SUBCUTANEOUS
  Filled 2017-09-24 (×8): qty 0.05

## 2017-09-24 MED ORDER — ALPRAZOLAM 0.5 MG PO TABS
0.5000 mg | ORAL_TABLET | Freq: Every evening | ORAL | Status: DC | PRN
Start: 2017-09-24 — End: 2017-09-30

## 2017-09-24 MED ORDER — ONDANSETRON HCL 4 MG PO TABS
4.0000 mg | ORAL_TABLET | Freq: Four times a day (QID) | ORAL | Status: DC | PRN
  Filled 2017-09-24: qty 1

## 2017-09-24 NOTE — ED Notes (Signed)
Patient vomiting, unable to administer oral meds.

## 2017-09-24 NOTE — Patient Instructions (Signed)
I spoke with ED Nurse. Will refer to Ed.

## 2017-09-24 NOTE — ED Provider Notes (Signed)
[facility] EMERGENCY DEPARTMENT Provider Note   CSN: [id number] Arrival date & time: 09/24/17  1137     History   Chief Complaint Chief Complaint  Patient presents with  . Abdominal Pain    HPI Traci Clark is a 84 y.o. female.  HPI  Pt was seen at 1210.  Per pt, c/o gradual onset and persistence of constant generalized abd "pain" for the past 4 to 5 days.  Has been associated with multiple intermittent episodes of N/V.  Describes the abd pain as "cramping." Last BM 3 days ago after taking a laxative. Pt states after her abd pain starts, she becomes lightheaded with N/V. Pt also c/o FB sensation to throat for the past 5 days after swallowing a fish bone. Pt states she has hx of dysphagia with esophageal dilatation. Pt was evaluated by GI MD office PTA, dx no fecal impaction, then sent to the ED for further evaluation.  Denies diarrhea, no fevers, no back pain, no rash, no CP/SOB, no black or blood in stools or emesis, no vertigo, no syncope, no sore throat, no neck pain, no hoarse voice, no stridor.      Past Medical History:  Diagnosis Date  . Anxiety   . Arthritis   . Chronic constipation   . Colon cancer ([place])   . Complication of anesthesia   . Diabetes mellitus without complication ([place])   . DVT (deep vein thrombosis) in pregnancy ([place])   . Dysphagia   . GERD (gastroesophageal reflux disease)   . Glaucoma   . Gout   . H/O hiatal hernia   . History of kidney stones   . MI (myocardial infarction) ([place]) 1993  . PONV (postoperative nausea and vomiting)   . Restless leg syndrome   . Vertigo     Patient Active Problem List   Diagnosis Date Noted  . History of colon cancer 10/25/2016  . Dysphagia, pharyngoesophageal phase 06/27/2014  . GERD (gastroesophageal reflux disease) 03/23/2014  . RLS (restless legs syndrome) 03/23/2014  . Colon cancer ([place]) 07/14/2012    Past Surgical History:  Procedure Laterality Date  . CHOLECYSTECTOMY    . COLECTOMY  2003  .  COLONOSCOPY N/A 07/29/2012   Procedure: COLONOSCOPY;  Surgeon: Rogene Houston, MD;  Location: [facility];  Service: Endoscopy;  Laterality: N/A;  100  . COLONOSCOPY WITH PROPOFOL N/A 12/13/2016   Procedure: COLONOSCOPY WITH PROPOFOL;  Surgeon: Rogene Houston, MD;  Location: [facility];  Service: Endoscopy;  Laterality: N/A;  8:30  . ESOPHAGEAL DILATION  11/03/2015   Procedure: ESOPHAGEAL DILATION;  Surgeon: Rogene Houston, MD;  Location: [facility];  Service: Endoscopy;;  . ESOPHAGOGASTRODUODENOSCOPY N/A 03/25/2014   Procedure: ESOPHAGOGASTRODUODENOSCOPY (EGD);  Surgeon: Rogene Houston, MD;  [facility]: [facility];  Service: Endoscopy;  Laterality: N/A;  1055  . ESOPHAGOGASTRODUODENOSCOPY N/A 11/03/2015   Procedure: ESOPHAGOGASTRODUODENOSCOPY (EGD);  Surgeon: Rogene Houston, MD;  Location: [facility];  Service: Endoscopy;  Laterality: N/A;  1:55 - moved to 11:15 - Ann to notify - moved to 12:45 - pt knows to arrive at 11:45  . MALONEY DILATION N/A 03/25/2014   Procedure: Venia Minks DILATION;  Surgeon: Rogene Houston, MD;  Location: [facility];  Service: Endoscopy;  Laterality: N/A;  . POLYPECTOMY  12/13/2016   Procedure: POLYPECTOMY;  Surgeon: Rogene Houston, MD;  Location: [facility];  Service: Endoscopy;;  polyp  . TUBAL LIGATION    . VEIN SURGERY Bilateral  stripped vein due to DVT     OB History    Gravida      Para      Term      Preterm      AB      Living  7     SAB      TAB      Ectopic      Multiple      Live Births               Home Medications    Prior to Admission medications   Medication Sig Start Date End Date Taking? Authorizing Provider  acetaminophen (TYLENOL) 325 MG tablet Take 650 mg by mouth every 6 (six) hours as needed for pain.    [provider]  ALPRAZolam Duanne Moron) 0.5 MG tablet Take 0.5 mg by mouth at bedtime as needed for sleep.    [provider]  alum & mag hydroxide-simeth (MAALOX/MYLANTA)  200-200-20 MG/5ML suspension Take 30 mLs by mouth daily.    [provider]  aspirin 81 MG tablet Take 1 tablet (81 mg total) by mouth daily. 11/04/15   Rehman, Mechele Dawley, MD  colchicine 0.6 MG tablet Take 0.6 mg by mouth as needed.     [provider]  esomeprazole (NEXIUM) 40 MG capsule Take 40 mg by mouth daily before breakfast.    [provider]  gabapentin (NEURONTIN) 300 MG capsule Take 300 mg by mouth at bedtime.     [provider]  hydrochlorothiazide (HYDRODIURIL) 25 MG tablet Take 25 mg by mouth daily.    [provider]  insulin glargine (LANTUS) 100 UNIT/ML injection Inject 10 Units into the skin at bedtime. 14 units at night     [provider]  insulin lispro (HUMALOG) 100 UNIT/ML injection Inject 4-10 Units into the skin 3 (three) times daily before meals. Sliding scale 151- 200=4 units; 201-250= 6 units; 251-300= 8 units; 301-350= 10 units.    [provider]  meclizine (ANTIVERT) 25 MG tablet Take 25 mg by mouth 3 (three) times daily as needed for dizziness.     [provider]  Multiple Vitamins-Minerals (HAIR SKIN AND NAILS FORMULA) TABS Take 1 tablet by mouth every morning.     [provider]  ondansetron (ZOFRAN-ODT) 8 MG disintegrating tablet Take 1 tablet by mouth every 6 (six) hours as needed for nausea or vomiting.  10/18/15   [provider]  polyethylene glycol (MIRALAX / GLYCOLAX) packet Take 17 g by mouth daily.    [provider]    Family History History reviewed. No pertinent family history.  Social History Social History   Tobacco Use  . Smoking status: Never Smoker  . Smokeless tobacco: Never Used  Substance Use Topics  . Alcohol use: No    Alcohol/week: 0.0 oz  . Drug use: No     Allergies   Amoxicillin; Macrobid [nitrofurantoin monohyd macro]; Codeine; and Darvon [propoxyphene]   Review of Systems Review of Systems ROS: Statement: All systems  negative except as marked or noted in the HPI; Constitutional: Negative for fever and chills. ; ; Eyes: Negative for eye pain, redness and discharge. ; ; ENMT: +FB sensation in throat. Negative for ear pain, hoarseness, nasal congestion, sinus pressure and sore throat. ; ; Cardiovascular: Negative for chest pain, palpitations, diaphoresis, dyspnea and peripheral edema. ; ; Respiratory: Negative for cough, wheezing and stridor. ; ; Gastrointestinal: +N/V, abd pain. Negative for diarrhea, blood in stool,  hematemesis, jaundice and rectal bleeding. . ; ; Genitourinary: Negative for dysuria, flank pain and hematuria. ; ; Musculoskeletal: Negative for back pain and neck pain. Negative for swelling and trauma.; ; Skin: Negative for pruritus, rash, abrasions, blisters, bruising and skin lesion.; ; Neuro: +lightheadedness. Negative for headache and neck stiffness. Negative for weakness, altered level of consciousness, altered mental status, extremity weakness, paresthesias, involuntary movement, seizure and syncope.       Physical Exam Updated Vital Signs BP (!) 184/83 (BP Location: Right Arm)   Pulse 64   Temp 97.8 F (36.6 C) (Oral)   Resp 18   Ht 5\' 6"  (1.676 m)   Wt 72.1 kg (159 lb)   SpO2 96%   BMI 25.66 kg/m     13:45 Orthostatic Vital Signs FS  Orthostatic Lying   BP- Lying: 183/76   Pulse- Lying: 60       Orthostatic Sitting  BP- Sitting: 170/87   Pulse- Sitting: 60       Orthostatic Standing at 0 minutes  BP- Standing at 0 minutes: 128/84   Pulse- Standing at 0 minutes: 64      Physical Exam 1215: Physical examination:  Nursing notes reviewed; Vital signs and O2 SAT reviewed;  Constitutional: Well developed, Well nourished, Well hydrated, In no acute distress; Head:  Normocephalic, atraumatic; Eyes: EOMI, PERRL, No scleral icterus; ENMT: Mouth and pharynx normal, Mucous membranes moist. Mouth and pharynx without lesions. No tonsillar exudates. No intra-oral edema. No  submandibular or sublingual edema. No hoarse voice, no drooling, no stridor. No pain with manipulation of larynx. No trismus.; Neck: Supple, Full range of motion, No lymphadenopathy; Cardiovascular: Regular rate and rhythm, No gallop; Respiratory: Breath sounds clear & equal bilaterally, No wheezes.  Speaking full sentences with ease, Normal respiratory effort/excursion; Chest: Nontender, Movement normal; Abdomen: +dry heaving during exam. Soft, +diffuse tenderness to palp. No rebound or guarding. Nondistended, Normal bowel sounds; Genitourinary: No CVA tenderness; Extremities: Peripheral pulses normal, No tenderness, No edema, No calf edema or asymmetry.; Neuro: AA&Ox3, Major CN grossly intact.  Speech clear. No gross focal motor or sensory deficits in extremities.; Skin: Color normal, Warm, Dry.   ED Treatments / Results  Labs (all labs ordered are listed, but only abnormal results are displayed)   EKG EKG Interpretation  Date/Time:  Wednesday Sep 24 2017 11:58:31 EDT Ventricular Rate:  63 PR Interval:    QRS Duration: 115 QT Interval:  443 QTC Calculation: 454 R Axis:   -16 Text Interpretation:  Sinus rhythm Prolonged PR interval Nonspecific intraventricular conduction delay When compared with ECG of 12/10/2016 No significant change was found Confirmed by Francine Graven [phone]) on 09/24/2017 1:08:21 PM   Radiology   Procedures Procedures (including critical care time)  Medications Ordered in ED Medications  0.9 %  sodium chloride infusion ( Intravenous New Bag/Given 09/24/17 1339)  iopamidol (ISOVUE-300) 61 % injection 75 mL (has no administration in time range)  ondansetron (ZOFRAN) injection 4 mg (4 mg Intravenous Given 09/24/17 1419)  morphine 4 MG/ML injection 4 mg (4 mg Intravenous Given 09/24/17 1419)  iopamidol (ISOVUE-300) 61 % injection 100 mL (100 mLs Intravenous Contrast Given 09/24/17 1312)     Initial Impression / Assessment and Plan / ED Course  I have reviewed the  triage vital signs and the nursing notes.  Pertinent labs & imaging results that were available during my care of the patient were reviewed by me and considered in my medical decision making (see chart for details).  MDM  Reviewed: previous chart, nursing note and vitals Reviewed previous: labs and ECG Interpretation: labs, ECG, x-ray and CT scan   Results for orders placed or performed during the hospital encounter of 09/24/17  Comprehensive metabolic panel  Result Value Ref Range   Sodium 138 135 - 145 mmol/L   Potassium 3.7 3.5 - 5.1 mmol/L   Chloride 101 101 - 111 mmol/L   CO2 29 22 - 32 mmol/L   Glucose, Bld 130 (H) 65 - 99 mg/dL   BUN 11 6 - 20 mg/dL   Creatinine, Ser 1.18 (H) 0.44 - 1.00 mg/dL   Calcium 9.7 8.9 - 10.3 mg/dL   Total Protein 7.4 6.5 - 8.1 g/dL   Albumin 3.8 3.5 - 5.0 g/dL   AST 34 15 - 41 U/L   ALT 21 14 - 54 U/L   Alkaline Phosphatase 95 38 - 126 U/L   Total Bilirubin 1.2 0.3 - 1.2 mg/dL   GFR calc non Af Amer 41 (L) >60 mL/min   GFR calc Af Amer 48 (L) >60 mL/min   Anion gap 8 5 - 15  Lipase, blood  Result Value Ref Range   Lipase 276 (H) 11 - 51 U/L  Troponin I  Result Value Ref Range   Troponin I <0.03 <0.03 ng/mL  Lactic acid, plasma  Result Value Ref Range   Lactic Acid, Venous 1.3 0.5 - 1.9 mmol/L  CBC with Differential  Result Value Ref Range   WBC 4.7 4.0 - 10.5 K/uL   RBC 4.30 3.87 - 5.11 MIL/uL   Hemoglobin 12.7 12.0 - 15.0 g/dL   HCT 38.4 36.0 - 46.0 %   MCV 89.3 78.0 - 100.0 fL   MCH 29.5 26.0 - 34.0 pg   MCHC 33.1 30.0 - 36.0 g/dL   RDW 12.7 11.5 - 15.5 %   Platelets 197 150 - 400 K/uL   Neutrophils Relative % 39 %   Neutro Abs 1.9 1.7 - 7.7 K/uL   Lymphocytes Relative 49 %   Lymphs Abs 2.3 0.7 - 4.0 K/uL   Monocytes Relative 10 %   Monocytes Absolute 0.5 0.1 - 1.0 K/uL   Eosinophils Relative 1 %   Eosinophils Absolute 0.0 0.0 - 0.7 K/uL   Basophils Relative 1 %   Basophils Absolute 0.0 0.0 - 0.1 K/uL  Urinalysis, Routine  w reflex microscopic  Result Value Ref Range   Color, Urine STRAW (A) YELLOW   APPearance CLEAR CLEAR   Specific Gravity, Urine 1.016 1.005 - 1.030   pH 7.0 5.0 - 8.0   Glucose, UA NEGATIVE NEGATIVE mg/dL   Hgb urine dipstick NEGATIVE NEGATIVE   Bilirubin Urine NEGATIVE NEGATIVE   Ketones, ur NEGATIVE NEGATIVE mg/dL   Protein, ur NEGATIVE NEGATIVE mg/dL   Nitrite NEGATIVE NEGATIVE   Leukocytes, UA NEGATIVE NEGATIVE  I-Stat CG4 Lactic Acid, ED  Result Value Ref Range   Lactic Acid, Venous 1.51 0.5 - 1.9 mmol/L   Dg Chest 2 View Result Date: 09/24/2017 CLINICAL DATA:  Chest congestion, abdominal pain. History of colonic malignancy. EXAM: CHEST - 2 VIEW COMPARISON:  Chest x-ray of October 24, 2016 FINDINGS: The lungs are well-expanded. There is no focal infiltrate. The lung markings are coarse at the left base which is a chronic finding. There is no pleural effusion. The heart and pulmonary vascularity are normal. The mediastinum is normal in width. The bony thorax exhibits no acute abnormality. IMPRESSION: Mild chronic bronchitic changes, stable. No acute cardiopulmonary abnormality. Electronically Signed  By: David  Martinique M.D.   On: 09/24/2017 13:17   Ct Soft Tissue Neck Wo Contrast Result Date: 09/24/2017 CLINICAL DATA:  Difficulty swallowing since swallowing a fishbone on 09/20/2017. EXAM: CT NECK WITHOUT CONTRAST TECHNIQUE: Multidetector CT imaging of the neck was performed following the standard protocol without intravenous contrast. COMPARISON:  Cervical spine CT 05/23/2017 FINDINGS: Pharynx and larynx: Unchanged pharyngeal soft tissue contours compared to the prior CT without evidence of foreign body or mass on this unenhanced study. No fluid collection or significant inflammatory changes in the parapharyngeal or retropharyngeal spaces. Salivary glands: No inflammation, mass, or stone. Thyroid: Unremarkable. Lymph nodes: No enlarged or suspicious lymph nodes in the neck. Vascular:  Retropharyngeal course of the right internal carotid artery with associated impression on the posterior or pharyngeal wall. Mild calcified atherosclerosis at the right carotid bifurcation. Limited intracranial: Unremarkable. Visualized orbits: Unremarkable. Mastoids and visualized paranasal sinuses: Clear. Skeleton: The patient is edentulous. No acute osseous abnormality or suspicious osseous lesion is identified. Mild cervical disc degeneration is noted as well as advanced left C4-5 facet arthrosis. Upper chest: Clear lung apices. Other: None. IMPRESSION: No foreign body or other acute abnormality identified in the neck. Electronically Signed   By: Logan Bores M.D.   On: 09/24/2017 13:40   Ct Abdomen Pelvis W Contrast Result Date: 09/24/2017 CLINICAL DATA:  84 year old female with abdominal pain and constipation since 09/19/2017. History of colon cancer. Initial encounter. EXAM: CT ABDOMEN AND PELVIS WITH CONTRAST TECHNIQUE: Multidetector CT imaging of the abdomen and pelvis was performed using the standard protocol following bolus administration of intravenous contrast. CONTRAST:  151mL ISOVUE-300 IOPAMIDOL (ISOVUE-300) INJECTION 61% COMPARISON:  07/29/2017, 08/30/2016 and 11/16/2015 CT. FINDINGS: Lower chest: Minimal scarring lung bases.  Mild cardiomegaly. Hepatobiliary: Stable since 2017 are 8 mm hypodensity dome of the right lobe of liver and 1.1 cm hypodensity posterior aspect right lobe liver. Post cholecystectomy with intrahepatic and extrahepatic biliary duct dilation. Degree of biliary duct prominence appears to have progressed slightly when compared to 2017 examination. No calcified common bile duct stone or obstructing mass. It is possible this is normal for this patient however if there were any abnormal liver enzymes, further investigation with MRCP/ERCP may be considered. Pancreas: No pancreatic mass or inflammation. Spleen: No splenic mass or enlargement. Adrenals/Urinary Tract: No hydronephrosis  or obstructing stone. Multiple renal lesions. Majority of these are cysts. As noted on recent CT, increase in size of hyperdense structure within the left upper pole measuring 3.4 cm with Hounsfield units of 37 (series 7, image 11). It is possible this represents a hyperdense cyst but would require MR with and without contrast to confirm such and excludes cystic mass. If this exam is performed, attention to hyperdense 1.6 cm lesion medial aspect right kidney (series 7, image 16). This appears of similar size to prior exams. Noncontrast filled views the urinary bladder unremarkable. Stomach/Bowel: Partial colonic resection. No extraluminal inflammatory process. Distal transverse colon narrowing may be related to under distension (series 2, images 40 and 41). However, this appears slightly hyperdense, subtle inflammation could not excluded in proper clinical setting. Underlying mass less likely consideration as this appeared normal on 07/30/2027 CT. Vascular/Lymphatic: Atherosclerotic changes of ectatic aorta without focal aneurysm. Mild dilation distal common iliac arteries measuring up to 1.8 cm on the right 1.7 cm on the left. No large vessel occlusion. No adenopathy. Reproductive: No worrisome uterine or adnexal mass. Other: No free intraperitoneal air or bowel containing hernia. Musculoskeletal: Severe left hip joint degenerative changes  with subchondral cysts. Moderate right hip joint degenerative changes. Degenerative changes lower thoracic and lumbar spine. IMPRESSION: Partial colonic resection. No extraluminal inflammatory process. Distal transverse colon narrowing may be related to under distension (series 2, images 40 and 41). However, this appears slightly hyperdense and subtle inflammation could not excluded in proper clinical setting. Underlying mass less likely consideration as this appeared normal on 07/30/2027 CT. Post cholecystectomy with intrahepatic and extrahepatic biliary duct dilation. It is  possible this is normal for this patient however if there were abnormal liver enzymes, further investigation with MRCP/ERCP may be considered. Hyperdense renal lesions as noted on recent CT. Dedicated renal MR with contrast would be necessary for further delineation if clinically desired. Aortic Atherosclerosis (ICD10-I70.0). Ectatic aorta. No focal aortic aneurysm. Slightly dilated distal common iliac arteries. Marked bilateral hip joint degenerative change greater on left. Electronically Signed   By: Genia Del M.D.   On: 09/24/2017 14:11    1430:  BUN/Cr per baseline.  Lipase newly elevated. Pt continues nauseated despite meds for same. Will continue IVF, admit. Dx and testing d/w pt and family.  Questions answered.  Verb understanding, agreeable to admit. T/C returned from [facility] Dr. Manuella Ghazi, case discussed, including:  HPI, pertinent PM/SHx, VS/PE, dx testing, ED course and treatment:  Agreeable to admit.      Final Clinical Impressions(s) / ED Diagnoses   Final diagnoses:  None    ED Discharge Orders    None       Francine Graven, DO 09/27/17 2114

## 2017-09-24 NOTE — ED Triage Notes (Signed)
Patient c/o generalized abd pain with nausea and vomiting that started Friday. Per patient last normal BM Monday-no blood. Patient does report dark black blood in stool 2 weeks ago. Patient has hx of colon cancer, negative colonoscopy last year. Patient was being seen at [facility] office today to be checked for possible impaction. Per Dr Olevia Perches office, no impaction. Patient sent to ER by Dr Olevia Perches office. Patient also reports swallowing a fish bone Saturday and states that it feels like it is stuke in her throat.

## 2017-09-24 NOTE — H&P (Signed)
History and Physical    Traci Clark UXN:[id number] DOB: Jan 28, 1931 DOA: 09/24/2017  PCP: Rory Percy, MD   Patient coming from: GI office  Chief Complaint: Abd pain, N/V  HPI: Traci Clark is a 84 y.o. female with medical history significant for type 2 diabetes, hypertension, gout, anxiety, GERD, restless leg syndrome, colon cancer, and dysphagia with prior esophageal dilation who presents to the emergency department at the urging of her gastroenterologist after being seen in the office.  She was being seen in follow-up and was noted to have ongoing epigastric abdominal pain along with trouble with constipation.  She apparently required an enema last Sunday to have a bowel movement and also complains of dysphasia over the last 2 weeks.  She apparently had some fish last Friday and since then seems to think she may have a fishbone in her throat.  She is also anorexic and complains of having had a fever last night.  Of note, she had a rectal exam at the GI office with no findings of fecal impaction. She states that she is does have some blood in her stool about 2 weeks ago, but denies any further dark, tarry, stools or any blood in her stools.  She denies any hematemesis.   ED Course: Vital signs are stable and lipase noted to be 276.  AST 34, ALT 21, total bilirubin 1.2.  CT of the abdomen and pelvis with contrast demonstrates intra-and extra hepatic biliary ductal dilatation with prior cholecystectomy.  She has been given some IV fluid, morphine, and Zofran.  Review of Systems: All others reviewed and otherwise negative.  Past Medical History:  Diagnosis Date  . Anxiety   . Arthritis   . Chronic constipation   . Colon cancer ([place])   . Complication of anesthesia   . Diabetes mellitus without complication ([place])   . DVT (deep vein thrombosis) in pregnancy ([place])   . Dysphagia   . GERD (gastroesophageal reflux disease)   . Glaucoma   . Gout   . H/O hiatal hernia   . History of kidney  stones   . MI (myocardial infarction) ([place]) 1993  . PONV (postoperative nausea and vomiting)   . Restless leg syndrome   . Vertigo     Past Surgical History:  Procedure Laterality Date  . CHOLECYSTECTOMY    . COLECTOMY  2003  . COLONOSCOPY N/A 07/29/2012   Procedure: COLONOSCOPY;  Surgeon: Rogene Houston, MD;  [facility];  Service: Endoscopy;  Laterality: N/A;  100  . COLONOSCOPY WITH PROPOFOL N/A 12/13/2016   Procedure: COLONOSCOPY WITH PROPOFOL;  Surgeon: Rogene Houston, MD;  [facility]: [facility];  Service: Endoscopy;  Laterality: N/A;  8:30  . ESOPHAGEAL DILATION  11/03/2015   Procedure: ESOPHAGEAL DILATION;  Surgeon: Rogene Houston, MD;  [facility];  Service: Endoscopy;;  . ESOPHAGOGASTRODUODENOSCOPY N/A 03/25/2014   Procedure: ESOPHAGOGASTRODUODENOSCOPY (EGD);  Surgeon: Rogene Houston, MD;  [facility];  Service: Endoscopy;  Laterality: N/A;  1055  . ESOPHAGOGASTRODUODENOSCOPY N/A 11/03/2015   Procedure: ESOPHAGOGASTRODUODENOSCOPY (EGD);  Surgeon: Rogene Houston, MD;  [facility];  Service: Endoscopy;  Laterality: N/A;  1:55 - moved to 11:15 - Ann to notify - moved to 12:45 - pt knows to arrive at 11:45  . MALONEY DILATION N/A 03/25/2014   Procedure: Venia Minks DILATION;  Surgeon: Rogene Houston, MD;  [facility];  Service: Endoscopy;  Laterality: N/A;  . POLYPECTOMY  12/13/2016  Procedure: POLYPECTOMY;  Surgeon: Rogene Houston, MD;  [facility];  Service: Endoscopy;;  polyp  . TUBAL LIGATION    . VEIN SURGERY Bilateral    stripped vein due to DVT     reports that she has never smoked. She has never used smokeless tobacco. She reports that she does not drink alcohol or use drugs.  Allergies  Allergen Reactions  . Amoxicillin Anaphylaxis and Rash    "Skin peeled off"  . Macrobid [Nitrofurantoin Monohyd Macro] Anaphylaxis, Rash and Other (See Comments)    "Skin peeled off"  . Codeine Nausea And  Vomiting and Other (See Comments)    shaking  . Darvon [Propoxyphene] Nausea And Vomiting and Other (See Comments)    shaking    History reviewed. No pertinent family history.  Prior to Admission medications   Medication Sig Start Date End Date Taking? Authorizing Provider  acetaminophen (TYLENOL) 325 MG tablet Take 650 mg by mouth every 6 (six) hours as needed for pain.   Yes [provider]  ALPRAZolam Duanne Moron) 0.5 MG tablet Take 0.5 mg by mouth at bedtime as needed for sleep.   Yes [provider]  aspirin 81 MG tablet Take 1 tablet (81 mg total) by mouth daily. 11/04/15  Yes Rehman, Mechele Dawley, MD  colchicine 0.6 MG tablet Take 0.6 mg by mouth as needed.    Yes [provider]  esomeprazole (NEXIUM) 40 MG capsule Take 40 mg by mouth daily before breakfast.   Yes [provider]  gabapentin (NEURONTIN) 300 MG capsule Take 300 mg by mouth at bedtime.    Yes [provider]  hydrochlorothiazide (HYDRODIURIL) 25 MG tablet Take 25 mg by mouth daily.   Yes [provider]  insulin glargine (LANTUS) 100 UNIT/ML injection Inject 10 Units into the skin at bedtime.    Yes [provider]  insulin lispro (HUMALOG) 100 UNIT/ML injection Inject 4-10 Units into the skin 3 (three) times daily before meals. Sliding scale 151- 200=4 units; 201-250= 6 units; 251-300= 8 units; 301-350= 10 units.   Yes [provider]  meclizine (ANTIVERT) 25 MG tablet Take 25 mg by mouth 3 (three) times daily as needed for dizziness.    Yes [provider]  Multiple Vitamins-Minerals (HAIR SKIN AND NAILS FORMULA) TABS Take 1 tablet by mouth every morning.    Yes [provider]  ondansetron (ZOFRAN-ODT) 8 MG disintegrating tablet Take 1 tablet by mouth every 6 (six) hours as needed for nausea or vomiting.  10/18/15  Yes [provider]  polyethylene glycol (MIRALAX / GLYCOLAX) packet Take 17 g by mouth daily.   Yes [provider]    Physical Exam: Vitals:   09/24/17 1346 09/24/17 1348 09/24/17 1400 09/24/17 1430  BP: (!) 170/87 128/84 (!) 154/133 (!) 154/133  Pulse: 62 60  (!) 58  Resp: 13 17 15 16   Temp:      TempSrc:      SpO2: 100% 99%  95%  Weight:      Height:        Constitutional: NAD, calm, comfortable Vitals:   09/24/17 1346 09/24/17 1348 09/24/17 1400 09/24/17 1430  BP: (!) 170/87 128/84 (!) 154/133 (!) 154/133  Pulse: 62 60  (!) 58  Resp: 13 17 15 16   Temp:      TempSrc:      SpO2: 100% 99%  95%  Weight:      Height:       Eyes:  lids and conjunctivae normal ENMT: Mucous membranes are moist.  Neck: normal, supple Respiratory: clear to auscultation bilaterally. Normal respiratory effort. No accessory muscle use.  Cardiovascular: Regular rate and rhythm, no murmurs. No extremity edema. Abdomen: Mild generalized tenderness, no distention. Bowel sounds positive.  No guarding, rigidity, or rebound. Musculoskeletal:  No joint deformity upper and lower extremities.   Skin: no rashes, lesions, ulcers.  Psychiatric: Normal judgment and insight. Alert and oriented x 3. Normal mood.   Labs on Admission: I have personally reviewed following labs and imaging studies  CBC: Recent Labs  Lab 09/24/17 1216  WBC 4.7  NEUTROABS 1.9  HGB 12.7  HCT 38.4  MCV 89.3  PLT 671   Basic Metabolic Panel: Recent Labs  Lab 09/24/17 1216  NA 138  K 3.7  CL 101  CO2 29  GLUCOSE 130*  BUN 11  CREATININE 1.18*  CALCIUM 9.7   GFR: Estimated Creatinine Clearance: 36.1 mL/min (A) (by C-G formula based on SCr of 1.18 mg/dL (H)). Liver Function Tests: Recent Labs  Lab 09/24/17 1216  AST 34  ALT 21  ALKPHOS 95  BILITOT 1.2  PROT 7.4  ALBUMIN 3.8   Recent Labs  Lab 09/24/17 1216  LIPASE 276*   No results for input(s): AMMONIA in the last 168 hours. Coagulation Profile: No results for input(s): INR, PROTIME in the last 168 hours. Cardiac Enzymes: Recent Labs  Lab  09/24/17 1216  TROPONINI <0.03   BNP (last 3 results) No results for input(s): PROBNP in the last 8760 hours. HbA1C: No results for input(s): HGBA1C in the last 72 hours. CBG: No results for input(s): GLUCAP in the last 168 hours. Lipid Profile: No results for input(s): CHOL, HDL, LDLCALC, TRIG, CHOLHDL, LDLDIRECT in the last 72 hours. Thyroid Function Tests: No results for input(s): TSH, T4TOTAL, FREET4, T3FREE, THYROIDAB in the last 72 hours. Anemia Panel: No results for input(s): VITAMINB12, FOLATE, FERRITIN, TIBC, IRON, RETICCTPCT in the last 72 hours. Urine analysis:    Component Value Date/Time   COLORURINE STRAW (A) 09/24/2017 1343   APPEARANCEUR CLEAR 09/24/2017 1343   LABSPEC 1.016 09/24/2017 1343   PHURINE 7.0 09/24/2017 1343   GLUCOSEU NEGATIVE 09/24/2017 1343   HGBUR NEGATIVE 09/24/2017 1343   BILIRUBINUR NEGATIVE 09/24/2017 1343   KETONESUR NEGATIVE 09/24/2017 1343   PROTEINUR NEGATIVE 09/24/2017 1343   UROBILINOGEN 0.2 06/27/2014 1044   NITRITE NEGATIVE 09/24/2017 1343   LEUKOCYTESUR NEGATIVE 09/24/2017 1343    Radiological Exams on Admission: Dg Chest 2 View  Result Date: 09/24/2017 CLINICAL DATA:  Chest congestion, abdominal pain. History of colonic malignancy. EXAM: CHEST - 2 VIEW COMPARISON:  Chest x-ray of October 24, 2016 FINDINGS: The lungs are well-expanded. There is no focal infiltrate. The lung markings are coarse at the left base which is a chronic finding. There is no pleural effusion. The heart and pulmonary vascularity are normal. The mediastinum is normal in width. The bony thorax exhibits no acute abnormality. IMPRESSION: Mild chronic bronchitic changes, stable. No acute cardiopulmonary abnormality. Electronically Signed   By: David  Martinique M.D.   On: 09/24/2017 13:17   Ct Soft Tissue Neck Wo Contrast  Result Date: 09/24/2017 CLINICAL DATA:  Difficulty swallowing since swallowing a fishbone on 09/20/2017. EXAM: CT NECK WITHOUT CONTRAST TECHNIQUE:  Multidetector CT imaging of the neck was performed following the standard protocol without intravenous contrast. COMPARISON:  Cervical spine CT 05/23/2017 FINDINGS: Pharynx and larynx: Unchanged pharyngeal soft tissue contours compared to the prior CT without evidence of foreign  body or mass on this unenhanced study. No fluid collection or significant inflammatory changes in the parapharyngeal or retropharyngeal spaces. Salivary glands: No inflammation, mass, or stone. Thyroid: Unremarkable. Lymph nodes: No enlarged or suspicious lymph nodes in the neck. Vascular: Retropharyngeal course of the right internal carotid artery with associated impression on the posterior or pharyngeal wall. Mild calcified atherosclerosis at the right carotid bifurcation. Limited intracranial: Unremarkable. Visualized orbits: Unremarkable. Mastoids and visualized paranasal sinuses: Clear. Skeleton: The patient is edentulous. No acute osseous abnormality or suspicious osseous lesion is identified. Mild cervical disc degeneration is noted as well as advanced left C4-5 facet arthrosis. Upper chest: Clear lung apices. Other: None. IMPRESSION: No foreign body or other acute abnormality identified in the neck. Electronically Signed   By: Logan Bores M.D.   On: 09/24/2017 13:40   Ct Abdomen Pelvis W Contrast  Result Date: 09/24/2017 CLINICAL DATA:  84 year old female with abdominal pain and constipation since 09/19/2017. History of colon cancer. Initial encounter. EXAM: CT ABDOMEN AND PELVIS WITH CONTRAST TECHNIQUE: Multidetector CT imaging of the abdomen and pelvis was performed using the standard protocol following bolus administration of intravenous contrast. CONTRAST:  164mL ISOVUE-300 IOPAMIDOL (ISOVUE-300) INJECTION 61% COMPARISON:  07/29/2017, 08/30/2016 and 11/16/2015 CT. FINDINGS: Lower chest: Minimal scarring lung bases.  Mild cardiomegaly. Hepatobiliary: Stable since 2017 are 8 mm hypodensity dome of the right lobe of liver and  1.1 cm hypodensity posterior aspect right lobe liver. Post cholecystectomy with intrahepatic and extrahepatic biliary duct dilation. Degree of biliary duct prominence appears to have progressed slightly when compared to 2017 examination. No calcified common bile duct stone or obstructing mass. It is possible this is normal for this patient however if there were any abnormal liver enzymes, further investigation with MRCP/ERCP may be considered. Pancreas: No pancreatic mass or inflammation. Spleen: No splenic mass or enlargement. Adrenals/Urinary Tract: No hydronephrosis or obstructing stone. Multiple renal lesions. Majority of these are cysts. As noted on recent CT, increase in size of hyperdense structure within the left upper pole measuring 3.4 cm with Hounsfield units of 37 (series 7, image 11). It is possible this represents a hyperdense cyst but would require MR with and without contrast to confirm such and excludes cystic mass. If this exam is performed, attention to hyperdense 1.6 cm lesion medial aspect right kidney (series 7, image 16). This appears of similar size to prior exams. Noncontrast filled views the urinary bladder unremarkable. Stomach/Bowel: Partial colonic resection. No extraluminal inflammatory process. Distal transverse colon narrowing may be related to under distension (series 2, images 40 and 41). However, this appears slightly hyperdense, subtle inflammation could not excluded in proper clinical setting. Underlying mass less likely consideration as this appeared normal on 07/30/2027 CT. Vascular/Lymphatic: Atherosclerotic changes of ectatic aorta without focal aneurysm. Mild dilation distal common iliac arteries measuring up to 1.8 cm on the right 1.7 cm on the left. No large vessel occlusion. No adenopathy. Reproductive: No worrisome uterine or adnexal mass. Other: No free intraperitoneal air or bowel containing hernia. Musculoskeletal: Severe left hip joint degenerative changes with  subchondral cysts. Moderate right hip joint degenerative changes. Degenerative changes lower thoracic and lumbar spine. IMPRESSION: Partial colonic resection. No extraluminal inflammatory process. Distal transverse colon narrowing may be related to under distension (series 2, images 40 and 41). However, this appears slightly hyperdense and subtle inflammation could not excluded in proper clinical setting. Underlying mass less likely consideration as this appeared normal on 07/30/2027 CT. Post cholecystectomy with intrahepatic and extrahepatic biliary duct dilation.  It is possible this is normal for this patient however if there were abnormal liver enzymes, further investigation with MRCP/ERCP may be considered. Hyperdense renal lesions as noted on recent CT. Dedicated renal MR with contrast would be necessary for further delineation if clinically desired. Aortic Atherosclerosis (ICD10-I70.0). Ectatic aorta. No focal aortic aneurysm. Slightly dilated distal common iliac arteries. Marked bilateral hip joint degenerative change greater on left. Electronically Signed   By: Genia Del M.D.   On: 09/24/2017 14:11    EKG: Independently reviewed.  Sinus rhythm at 63 bpm.  Assessment/Plan Principal Problem:   Intractable nausea and vomiting Active Problems:   GERD (gastroesophageal reflux disease)   RLS (restless legs syndrome)   Dysphagia, pharyngoesophageal phase   History of colon cancer   Diabetes ([facility])   Essential hypertension   Gout   Anxiety    1. Intractable nausea and vomiting with epigastric abdominal pain.  She appears to have a vague conglomerate of symptoms which do not appear to point to any particular cause, but could be related to a mild pancreatitis versus gastroenteritis.  Continue n.p.o. except medication with IV fluid and consult GI for further evaluation as she is well-known to them. 2. Dysphagia in the setting of prior esophageal dilation.  Per GI.  Continue n.p.o. except  medication for now. 3. GERD.  Continue PPI. 4. Diabetes.  Continue home insulin with sliding scale at half dose of Lantus until diet is advanced. 5. Hypertension.  Hold HCTZ with hydralazine pushes as needed. 6. Gout.  Colchicine as needed. 7. Anxiety.  Xanax as needed. 8. RLS.  Gabapentin.   DVT prophylaxis: Lovenox Code Status: Full Family Communication: None at bedside Disposition Plan: Further evaluation and treatment recommendations per GI Consults called: GI, Dr. Laural Golden Admission status: Obs, MedSurg   Pratik Darleen Crocker DO [facility] Pager [phone]  If 7PM-7AM, please contact night-coverage [email or web address] Password [email or web address]  09/24/2017, 2:51 PM

## 2017-09-24 NOTE — Consult Note (Signed)
Referring Provider: No ref. provider found Primary Care Physician:  Rory Percy, MD Primary Gastroenterologist:  Dr. Laural Golden  Reason for Consultation:    Nausea vomiting and abdominal pain.  HPI:   History obtained from patient's grandson Randall Hiss who is at bedside as well as patient's records.  Patient received promethazine for nausea and vomiting and unable to provide accurate details as to her symptoms.  Patient apparently has been having abdominal pain associated with nausea and vomiting for several weeks.  She was evaluated in ER at [facility] and underwent abdominal pelvic CT on 07/29/2017 and she tells me she was kept there overnight and discharged.  She has been having intermittent abdominal pain with nausea and vomiting and she also keeps telling me that she has a blockage even though her bowels have been moving daily or every other day.  According to her grandson Randall Hiss she has been taking 1-2 Dulcolax tablets daily.  She states her stools are semi-formed or mushy.  She has not had melena or rectal bleeding. She has had daily symptoms for over a week.  She has been having nausea and vomiting and she has been having daily pain.  She states pain may last for several hours then on one occasion lasted for 2 days.  She did not feel well over the weekend and stayed at home and did not go to church. She came to her office this morning.  She was evaluated by Ms. Deberah Castle, NP and sent over to emergency room for evaluation.  She was evaluated and hospitalized for further management. Her pain eased with morphine sulfate but she kept vomiting despite getting ondansetron.  She was therefore given promethazine and it has stopped her nausea and vomiting. According to her grandson her appetite has been up and down.  However she has not lost much weight according to office weight records. When she was seen in the office earlier today she was also complaining of solid food dysphagia to Ms. Setzer.  She  felt as if she had a fishbone in her throat.  She had neck CT and it did not reveal foreign body in her hypopharynx or her proximal esophagus.  She is not complaining of dysphagia at this time.  She has a history of esophageal stricture which was last dilated in June 2017.  She has a history of colon carcinoma.  She underwent right hemicolectomy in 2002.  She had colonoscopy in July 2018 for surveillance but she was complaining of constipation. Colonoscopy revealed patent ileocolonic anastomosis.  She had small polyp removed from splenic flexure and was tubular adenoma.  She was advised to consume fiber rich foods and maintained on Linzess.  Patient is retired.  She lives in [place] with 1 of her daughters and her grandson Randall Hiss.  She remains busy.  She does not smoke cigarettes or drink alcohol. She has 5 daughters and 2 sons.  All but one child are diabetic.   Past Medical History:  Diagnosis Date  . Anxiety   . Arthritis   . Chronic constipation   . Colon cancer ([place])   . Complication of anesthesia   . Diabetes mellitus without complication ([place])   . DVT (deep vein thrombosis) in pregnancy ([place])   .    Marland Kitchen GERD (gastroesophageal reflux disease)   . Glaucoma   . Gout   . H/O hiatal hernia   . History of kidney stones   . MI (myocardial infarction) ([place]) 1993  . PONV (postoperative nausea  and vomiting)   . Restless leg syndrome   . Vertigo     Past Surgical History:  Procedure Laterality Date  . CHOLECYSTECTOMY    . COLECTOMY  2003  . COLONOSCOPY N/A 07/29/2012   Procedure: COLONOSCOPY;  Surgeon: Rogene Houston, MD;  [facility];  Service: Endoscopy;  Laterality: N/A;  100  . COLONOSCOPY WITH PROPOFOL N/A 12/13/2016   Procedure: COLONOSCOPY WITH PROPOFOL;  Surgeon: Rogene Houston, MD;  [facility]: [facility];  Service: Endoscopy;  Laterality: N/A;  8:30  . ESOPHAGEAL DILATION  11/03/2015   Procedure: ESOPHAGEAL DILATION;  Surgeon: Rogene Houston, MD;   [facility];  Service: Endoscopy;;  . ESOPHAGOGASTRODUODENOSCOPY N/A 03/25/2014   Procedure: ESOPHAGOGASTRODUODENOSCOPY (EGD);  Surgeon: Rogene Houston, MD;  [facility];  Service: Endoscopy;  Laterality: N/A;  1055  . ESOPHAGOGASTRODUODENOSCOPY N/A 11/03/2015   Procedure: ESOPHAGOGASTRODUODENOSCOPY (EGD);  Surgeon: Rogene Houston, MD;  [facility];  Service: Endoscopy;  Laterality: N/A;  1:55 - moved to 11:15 - Ann to notify - moved to 12:45 - pt knows to arrive at 11:45  . MALONEY DILATION N/A 03/25/2014   Procedure: Venia Minks DILATION;  Surgeon: Rogene Houston, MD;  [facility];  Service: Endoscopy;  Laterality: N/A;  . POLYPECTOMY  12/13/2016   Procedure: POLYPECTOMY;  Surgeon: Rogene Houston, MD;  [facility];  Service: Endoscopy;;  polyp  . TUBAL LIGATION    . VEIN SURGERY Bilateral    stripped vein due to DVT    Prior to Admission medications   Medication Sig Start Date End Date Taking? Authorizing Provider  acetaminophen (TYLENOL) 325 MG tablet Take 650 mg by mouth every 6 (six) hours as needed for pain.   Yes [provider]  ALPRAZolam Duanne Moron) 0.5 MG tablet Take 0.5 mg by mouth at bedtime as needed for sleep.   Yes [provider]  aspirin 81 MG tablet Take 1 tablet (81 mg total) by mouth daily. 11/04/15  Yes Laconda Basich, Mechele Dawley, MD  colchicine 0.6 MG tablet Take 0.6 mg by mouth as needed.    Yes [provider]  esomeprazole (NEXIUM) 40 MG capsule Take 40 mg by mouth daily before breakfast.   Yes [provider]  gabapentin (NEURONTIN) 300 MG capsule Take 300 mg by mouth at bedtime.    Yes [provider]  hydrochlorothiazide (HYDRODIURIL) 25 MG tablet Take 25 mg by mouth daily.   Yes [provider]  insulin glargine (LANTUS) 100 UNIT/ML injection Inject 10 Units into the skin at bedtime.    Yes [provider]  insulin lispro (HUMALOG) 100 UNIT/ML injection Inject  4-10 Units into the skin 3 (three) times daily before meals. Sliding scale 151- 200=4 units; 201-250= 6 units; 251-300= 8 units; 301-350= 10 units.   Yes [provider]  meclizine (ANTIVERT) 25 MG tablet Take 25 mg by mouth 3 (three) times daily as needed for dizziness.    Yes [provider]  Multiple Vitamins-Minerals (HAIR SKIN AND NAILS FORMULA) TABS Take 1 tablet by mouth every morning.    Yes [provider]  ondansetron (ZOFRAN-ODT) 8 MG disintegrating tablet Take 1 tablet by mouth every 6 (six) hours as needed for nausea or vomiting.  10/18/15  Yes [provider]  polyethylene glycol (MIRALAX / GLYCOLAX) packet Take 17 g by mouth daily.   Yes [provider]    Current Facility-Administered Medications  Medication Dose  Route Frequency Provider Last Rate Last Dose  . ALPRAZolam Duanne Moron) tablet 0.5 mg  0.5 mg Oral QHS PRN Manuella Ghazi, Pratik D, DO      . aspirin EC tablet 81 mg  81 mg Oral Daily Shah, Pratik D, DO      . colchicine tablet 0.6 mg  0.6 mg Oral PRN Manuella Ghazi, Pratik D, DO      . enoxaparin (LOVENOX) injection 40 mg  40 mg Subcutaneous Q24H Shah, Pratik D, DO      . gabapentin (NEURONTIN) capsule 300 mg  300 mg Oral QHS Shah, Pratik D, DO      . hydrALAZINE (APRESOLINE) injection 10 mg  10 mg Intravenous Q4H PRN Manuella Ghazi, Pratik D, DO   10 mg at 09/24/17 1625  . insulin aspart (novoLOG) injection 4-10 Units  4-10 Units Subcutaneous TID AC Shah, Pratik D, DO      . insulin glargine (LANTUS) injection 5 Units  5 Units Subcutaneous QHS Manuella Ghazi, Pratik D, DO      . lactated ringers infusion   Intravenous Continuous Heath Lark D, DO 100 mL/hr at 09/24/17 1518    . meclizine (ANTIVERT) tablet 25 mg  25 mg Oral TID PRN Manuella Ghazi, Pratik D, DO      . ondansetron (ZOFRAN) tablet 4 mg  4 mg Oral Q6H PRN Manuella Ghazi, Pratik D, DO       Or  . ondansetron (ZOFRAN) injection 4 mg  4 mg Intravenous Q6H PRN Manuella Ghazi, Pratik D, DO      . pantoprazole (PROTONIX) EC tablet 40 mg  40  mg Oral Daily Manuella Ghazi, Pratik D, DO      . promethazine (PHENERGAN) injection 12.5 mg  12.5 mg Intravenous Q6H PRN Manuella Ghazi, Pratik D, DO   12.5 mg at 09/24/17 1647    Allergies as of 09/24/2017 - Review Complete 09/24/2017  Allergen Reaction Noted  . Amoxicillin Anaphylaxis and Rash 10/31/2015  . Macrobid [nitrofurantoin monohyd macro] Anaphylaxis, Rash, and Other (See Comments) 10/31/2015  . Codeine Nausea And Vomiting and Other (See Comments) 07/14/2012  . Darvon [propoxyphene] Nausea And Vomiting and Other (See Comments) 06/27/2014    History reviewed. No pertinent family history.  Social History   Socioeconomic History  . Marital status: Married    Spouse name: Not on file  . Number of children: Not on file  . Years of education: Not on file  . Highest education level: Not on file  Occupational History  . Not on file  Social Needs  . Financial resource strain: Not on file  . Food insecurity:    Worry: Not on file    Inability: Not on file  . Transportation needs:    Medical: Not on file    Non-medical: Not on file  Tobacco Use  . Smoking status: Never Smoker  . Smokeless tobacco: Never Used  Substance and Sexual Activity  . Alcohol use: No    Alcohol/week: 0.0 oz  . Drug use: No  . Sexual activity: Not Currently    Birth control/protection: Surgical  Lifestyle  . Physical activity:    Days per week: Not on file    Minutes per session: Not on file  . Stress: Not on file  Relationships  . Social connections:    Talks on phone: Not on file    Gets together: Not on file    Attends religious service: Not on file    Active member of club or organization: Not on file    Attends meetings of  clubs or organizations: Not on file    Relationship status: Not on file  . Intimate partner violence:    Fear of current or ex partner: Not on file    Emotionally abused: Not on file    Physically abused: Not on file    Forced sexual activity: Not on file  Other Topics Concern  .  Not on file  Social History Narrative  . Not on file    Review of Systems: See HPI, otherwise normal ROS  Physical Exam: Temp:  [97.8 F (36.6 C)-98.6 F (37 C)] 98.6 F (37 C) (05/08 1553) Pulse Rate:  [58-80] 67 (05/08 1651) Resp:  [11-20] 16 (05/08 1553) BP: (128-184)/(76-133) 165/77 (05/08 1651) SpO2:  [91 %-100 %] 98 % (05/08 1553) Weight:  [157 lb 1 oz (71.2 kg)-159 lb 12.8 oz (72.5 kg)] 157 lb 1 oz (71.2 kg) (05/08 1553) Last BM Date: 09/22/17   Patient is alert and in no acute distress. She is having difficulty answering the questions. Conjunctiva is pink.  Sclerae nonicteric. Oropharyngeal mucosa is normal. She has upper and lower dentures in place. Cardiac exam with regular rhythm normal S1 and S2.  No murmur or gallop noted. Lungs are clear to auscultation. Abdomen is symmetrical.  She has vertical scar across upper abdomen above umbilicus.  Bowel sounds are hypoactive.  No bruits noted.  On palpation abdomen is soft and nontender without organomegaly or masses.  Rectal examination deferred as she had one in the office revealing No evidence of impaction or stool in rectum. No clubbing or peripheral edema noted.   Lab Results: Recent Labs    09/24/17 1216  WBC 4.7  HGB 12.7  HCT 38.4  PLT 197   BMET Recent Labs    09/24/17 1216  NA 138  K 3.7  CL 101  CO2 29  GLUCOSE 130*  BUN 11  CREATININE 1.18*  CALCIUM 9.7   LFT Recent Labs    09/24/17 1216  PROT 7.4  ALBUMIN 3.8  AST 34  ALT 21  ALKPHOS 95  BILITOT 1.2    Studies/Results: Dg Chest 2 View  Result Date: 09/24/2017 CLINICAL DATA:  Chest congestion, abdominal pain. History of colonic malignancy. EXAM: CHEST - 2 VIEW COMPARISON:  Chest x-ray of October 24, 2016 FINDINGS: The lungs are well-expanded. There is no focal infiltrate. The lung markings are coarse at the left base which is a chronic finding. There is no pleural effusion. The heart and pulmonary vascularity are normal. The mediastinum  is normal in width. The bony thorax exhibits no acute abnormality. IMPRESSION: Mild chronic bronchitic changes, stable. No acute cardiopulmonary abnormality. Electronically Signed   By: David  Martinique M.D.   On: 09/24/2017 13:17   Ct Soft Tissue Neck Wo Contrast  Result Date: 09/24/2017 CLINICAL DATA:  Difficulty swallowing since swallowing a fishbone on 09/20/2017. EXAM: CT NECK WITHOUT CONTRAST TECHNIQUE: Multidetector CT imaging of the neck was performed following the standard protocol without intravenous contrast. COMPARISON:  Cervical spine CT 05/23/2017 FINDINGS: Pharynx and larynx: Unchanged pharyngeal soft tissue contours compared to the prior CT without evidence of foreign body or mass on this unenhanced study. No fluid collection or significant inflammatory changes in the parapharyngeal or retropharyngeal spaces. Salivary glands: No inflammation, mass, or stone. Thyroid: Unremarkable. Lymph nodes: No enlarged or suspicious lymph nodes in the neck. Vascular: Retropharyngeal course of the right internal carotid artery with associated impression on the posterior or pharyngeal wall. Mild calcified atherosclerosis at the right carotid bifurcation.  Limited intracranial: Unremarkable. Visualized orbits: Unremarkable. Mastoids and visualized paranasal sinuses: Clear. Skeleton: The patient is edentulous. No acute osseous abnormality or suspicious osseous lesion is identified. Mild cervical disc degeneration is noted as well as advanced left C4-5 facet arthrosis. Upper chest: Clear lung apices. Other: None. IMPRESSION: No foreign body or other acute abnormality identified in the neck. Electronically Signed   By: Logan Bores M.D.   On: 09/24/2017 13:40   Ct Abdomen Pelvis W Contrast  Result Date: 09/24/2017 CLINICAL DATA:  84 year old female with abdominal pain and constipation since 09/19/2017. History of colon cancer. Initial encounter. EXAM: CT ABDOMEN AND PELVIS WITH CONTRAST TECHNIQUE: Multidetector CT  imaging of the abdomen and pelvis was performed using the standard protocol following bolus administration of intravenous contrast. CONTRAST:  126mL ISOVUE-300 IOPAMIDOL (ISOVUE-300) INJECTION 61% COMPARISON:  07/29/2017, 08/30/2016 and 11/16/2015 CT. FINDINGS: Lower chest: Minimal scarring lung bases.  Mild cardiomegaly. Hepatobiliary: Stable since 2017 are 8 mm hypodensity dome of the right lobe of liver and 1.1 cm hypodensity posterior aspect right lobe liver. Post cholecystectomy with intrahepatic and extrahepatic biliary duct dilation. Degree of biliary duct prominence appears to have progressed slightly when compared to 2017 examination. No calcified common bile duct stone or obstructing mass. It is possible this is normal for this patient however if there were any abnormal liver enzymes, further investigation with MRCP/ERCP may be considered. Pancreas: No pancreatic mass or inflammation. Spleen: No splenic mass or enlargement. Adrenals/Urinary Tract: No hydronephrosis or obstructing stone. Multiple renal lesions. Majority of these are cysts. As noted on recent CT, increase in size of hyperdense structure within the left upper pole measuring 3.4 cm with Hounsfield units of 37 (series 7, image 11). It is possible this represents a hyperdense cyst but would require MR with and without contrast to confirm such and excludes cystic mass. If this exam is performed, attention to hyperdense 1.6 cm lesion medial aspect right kidney (series 7, image 16). This appears of similar size to prior exams. Noncontrast filled views the urinary bladder unremarkable. Stomach/Bowel: Partial colonic resection. No extraluminal inflammatory process. Distal transverse colon narrowing may be related to under distension (series 2, images 40 and 41). However, this appears slightly hyperdense, subtle inflammation could not excluded in proper clinical setting. Underlying mass less likely consideration as this appeared normal on 07/30/2027  CT. Vascular/Lymphatic: Atherosclerotic changes of ectatic aorta without focal aneurysm. Mild dilation distal common iliac arteries measuring up to 1.8 cm on the right 1.7 cm on the left. No large vessel occlusion. No adenopathy. Reproductive: No worrisome uterine or adnexal mass. Other: No free intraperitoneal air or bowel containing hernia. Musculoskeletal: Severe left hip joint degenerative changes with subchondral cysts. Moderate right hip joint degenerative changes. Degenerative changes lower thoracic and lumbar spine. IMPRESSION: Partial colonic resection. No extraluminal inflammatory process. Distal transverse colon narrowing may be related to under distension (series 2, images 40 and 41). However, this appears slightly hyperdense and subtle inflammation could not excluded in proper clinical setting. Underlying mass less likely consideration as this appeared normal on 07/30/2027 CT. Post cholecystectomy with intrahepatic and extrahepatic biliary duct dilation. It is possible this is normal for this patient however if there were abnormal liver enzymes, further investigation with MRCP/ERCP may be considered. Hyperdense renal lesions as noted on recent CT. Dedicated renal MR with contrast would be necessary for further delineation if clinically desired. Aortic Atherosclerosis (ICD10-I70.0). Ectatic aorta. No focal aortic aneurysm. Slightly dilated distal common iliac arteries. Marked bilateral hip joint degenerative change  greater on left. Electronically Signed   By: Genia Del M.D.   On: 09/24/2017 14:11   I have reviewed current abdominal pelvic CT along with CT From 07/29/2017 as well as CT from 11/16/2015. CBD measures 10 to 11 mm but no stones noted in it.  Pancreas is unremarkable.  Ileocolonic anastomosis also appears to be unremarkable.  Short segment with wall thickening appears to be due to spasm as this area was wide open on study of 07/29/2017. She has focal calcification at the takeoff of the  celiac trunk but both celiac trunk and SMA are patent.  Assessment;  Patient is 84 year old Afro-American female who has a history of colon carcinoma and is up-to-date on surveillance exams who presents with few week history of abdominal pain and nausea and vomiting.  Pain appears to be in periumbilical/epigastric region.  CT does not show any changes to suggest bowel obstruction.  Serum lipase is elevated about 5 folds.  But CT does not show any changes of pancreatitis.  It is quite possible that she has mild pancreatitis which explain the symptoms.  Other less likely diagnoses would be peptic ulcer disease.  Doubt intestinal ischemia given patent celiac trunk and SMA.  History of dysphagia and question of fishbone in her throat.  CT is negative.  She may need EGD during this hospitalization depending her clinical course.  Recommendations;  Continue n.p.o. status except for ice chips and p.o. Medications. Will repeat labs in a.m. to include CBC comprehensive chemistry panel and serum lipase. Will request records from [facility].  Patient will be reevaluated in a.m.   LOS: 0 days   Shatisha Falter  09/24/2017, 7:27 PM

## 2017-09-24 NOTE — ED Notes (Signed)
Report given to Audrea Muscat, patient ready for transfer to the floor.

## 2017-09-24 NOTE — ED Notes (Signed)
Patient in CT

## 2017-09-24 NOTE — Progress Notes (Signed)
Subjective:    Patient ID: Traci Clark, female    DOB: 1931-02-16, 84 y.o.   MRN: [id number]  HPI Here today for f/u. Last seen in 2017 for N and V. Wt in 2017 162. Today her weight is 159.8lb.  She tells me she has abdominal pain.  She also c/o dizziness. She says she cannot have a BM.  Her last BM was Sunday.. She had a BM Sunday after taking an enema.  enema  She has a hx of chronic constipation. . She also c/o dysphagia. She has had trouble swallowing x 2 weeks.  She feels like she has a fishbone in her throat. She ate fish Friday.  Her appetite is not good.  She is restless in our office this morning. She is vomiting. Symptoms since last Saturday. She says she had a fever last night. None today.       Personal hx of colon cancer. Last colonoscopy in 2018 which revealed patient end to side colo colonic anastomosis, characterized by healthy appearing mucosa. One small polyp at splenic flexure. External hemorrhoids.  EGD in June of 2017: Benign appearing stenosis, dilated. Whitish plaques in the middle third of the esophagus consistent with mild Candida esophagitis. Tree small hyperplastic appearing gastric polyps,. Normal dudoenal bulb and second portion of the duodenum.   Review of Systems Past Medical History:  Diagnosis Date  . Anxiety   . Arthritis   . Colon cancer ([place])   . Complication of anesthesia   . Diabetes mellitus without complication ([place])   . DVT (deep vein thrombosis) in pregnancy ([place])   . GERD (gastroesophageal reflux disease)   . Glaucoma   . Gout   . H/O hiatal hernia   . History of kidney stones   . MI (myocardial infarction) ([place]) 1993  . PONV (postoperative nausea and vomiting)   . Restless leg syndrome   . Vertigo     Past Surgical History:  Procedure Laterality Date  . CHOLECYSTECTOMY    . COLECTOMY  2003  . COLONOSCOPY N/A 07/29/2012   Procedure: COLONOSCOPY;  Surgeon: Rogene Houston, MD;  [facility];  Service: Endoscopy;   Laterality: N/A;  100  . COLONOSCOPY WITH PROPOFOL N/A 12/13/2016   Procedure: COLONOSCOPY WITH PROPOFOL;  Surgeon: Rogene Houston, MD;  [facility];  Service: Endoscopy;  Laterality: N/A;  8:30  . ESOPHAGEAL DILATION  11/03/2015   Procedure: ESOPHAGEAL DILATION;  Surgeon: Rogene Houston, MD;  [facility];  Service: Endoscopy;;  . ESOPHAGOGASTRODUODENOSCOPY N/A 03/25/2014   Procedure: ESOPHAGOGASTRODUODENOSCOPY (EGD);  Surgeon: Rogene Houston, MD;  [facility];  Service: Endoscopy;  Laterality: N/A;  1055  . ESOPHAGOGASTRODUODENOSCOPY N/A 11/03/2015   Procedure: ESOPHAGOGASTRODUODENOSCOPY (EGD);  Surgeon: Rogene Houston, MD;  [facility];  Service: Endoscopy;  Laterality: N/A;  1:55 - moved to 11:15 - Ann to notify - moved to 12:45 - pt knows to arrive at 11:45  . MALONEY DILATION N/A 03/25/2014   Procedure: Venia Minks DILATION;  Surgeon: Rogene Houston, MD;  [facility];  Service: Endoscopy;  Laterality: N/A;  . POLYPECTOMY  12/13/2016   Procedure: POLYPECTOMY;  Surgeon: Rogene Houston, MD;  [facility];  Service: Endoscopy;;  polyp  . TUBAL LIGATION    . VEIN SURGERY Bilateral    stripped vein due to DVT    Allergies  Allergen Reactions  . Amoxicillin Anaphylaxis and Rash    "Skin  peeled off"  . Macrobid [Nitrofurantoin Monohyd Macro] Anaphylaxis, Rash and Other (See Comments)    "Skin peeled off"  . Codeine Nausea And Vomiting and Other (See Comments)    shaking  . Darvon [Propoxyphene] Nausea And Vomiting and Other (See Comments)    shaking    Current Outpatient Medications on File Prior to Visit  Medication Sig Dispense Refill  . acetaminophen (TYLENOL) 325 MG tablet Take 650 mg by mouth every 6 (six) hours as needed for pain.    Marland Kitchen ALPRAZolam (XANAX) 0.5 MG tablet Take 0.5 mg by mouth at bedtime as needed for sleep.    Marland Kitchen alum & mag hydroxide-simeth (MAALOX/MYLANTA) 200-200-20 MG/5ML suspension Take 30 mLs by  mouth daily.    Marland Kitchen aspirin 81 MG tablet Take 1 tablet (81 mg total) by mouth daily. 30 tablet   . colchicine 0.6 MG tablet Take 0.6 mg by mouth as needed.     Marland Kitchen esomeprazole (NEXIUM) 40 MG capsule Take 40 mg by mouth daily before breakfast.    . gabapentin (NEURONTIN) 300 MG capsule Take 300 mg by mouth at bedtime.     . hydrochlorothiazide (HYDRODIURIL) 25 MG tablet Take 25 mg by mouth daily.    . insulin glargine (LANTUS) 100 UNIT/ML injection Inject 10 Units into the skin at bedtime. 14 units at night     . insulin lispro (HUMALOG) 100 UNIT/ML injection Inject 4-10 Units into the skin 3 (three) times daily before meals. Sliding scale 151- 200=4 units; 201-250= 6 units; 251-300= 8 units; 301-350= 10 units.    . meclizine (ANTIVERT) 25 MG tablet Take 25 mg by mouth 3 (three) times daily as needed for dizziness.     . Multiple Vitamins-Minerals (HAIR SKIN AND NAILS FORMULA) TABS Take 1 tablet by mouth every morning.     . ondansetron (ZOFRAN-ODT) 8 MG disintegrating tablet Take 1 tablet by mouth every 6 (six) hours as needed for nausea or vomiting.   0  . polyethylene glycol (MIRALAX / GLYCOLAX) packet Take 17 g by mouth daily.     No current facility-administered medications on file prior to visit.         Objective:   Physical Exam Blood pressure (!) 152/80, pulse 80, temperature 98.3 F (36.8 C), height 5\' 6"  (1.676 m), weight 159 lb 12.8 oz (72.5 kg).  Alert and oriented. Skin warm and dry. Oral mucosa is moist. I did not visualize a fishbone . Sclera anicteric, conjunctivae is pink. Thyroid not enlarged. No cervical lymphadenopathy. Lungs clear. Heart regular rate and rhythm.  Abdomen is soft. Bowel sounds are positive. No hepatomegaly. No abdominal masses felt .Diffuse tenderness.and guarding   No edema to lower extremities.  Rectal exam: No impaction felt. No stool.        Assessment & Plan:  Abdominal pain ? Etiology. No impaction felt. I have referred patient to ED for acute  care. Dr. Laural Golden is aware.  ? Fish bone.  Dysphagia: Will need an EGD/ED in near future.

## 2017-09-25 DIAGNOSIS — I252 Old myocardial infarction: Secondary | ICD-10-CM | POA: Diagnosis not present

## 2017-09-25 DIAGNOSIS — N281 Cyst of kidney, acquired: Secondary | ICD-10-CM | POA: Diagnosis not present

## 2017-09-25 DIAGNOSIS — R932 Abnormal findings on diagnostic imaging of liver and biliary tract: Secondary | ICD-10-CM | POA: Diagnosis not present

## 2017-09-25 DIAGNOSIS — K219 Gastro-esophageal reflux disease without esophagitis: Secondary | ICD-10-CM | POA: Diagnosis not present

## 2017-09-25 DIAGNOSIS — Z794 Long term (current) use of insulin: Secondary | ICD-10-CM | POA: Diagnosis not present

## 2017-09-25 DIAGNOSIS — Z7982 Long term (current) use of aspirin: Secondary | ICD-10-CM | POA: Diagnosis not present

## 2017-09-25 DIAGNOSIS — G2581 Restless legs syndrome: Secondary | ICD-10-CM | POA: Diagnosis present

## 2017-09-25 DIAGNOSIS — R112 Nausea with vomiting, unspecified: Secondary | ICD-10-CM | POA: Diagnosis not present

## 2017-09-25 DIAGNOSIS — K210 Gastro-esophageal reflux disease with esophagitis: Secondary | ICD-10-CM | POA: Diagnosis not present

## 2017-09-25 DIAGNOSIS — K449 Diaphragmatic hernia without obstruction or gangrene: Secondary | ICD-10-CM | POA: Diagnosis not present

## 2017-09-25 DIAGNOSIS — K859 Acute pancreatitis without necrosis or infection, unspecified: Secondary | ICD-10-CM | POA: Diagnosis not present

## 2017-09-25 DIAGNOSIS — K317 Polyp of stomach and duodenum: Secondary | ICD-10-CM | POA: Diagnosis not present

## 2017-09-25 DIAGNOSIS — R69 Illness, unspecified: Secondary | ICD-10-CM | POA: Diagnosis not present

## 2017-09-25 DIAGNOSIS — K838 Other specified diseases of biliary tract: Secondary | ICD-10-CM | POA: Diagnosis not present

## 2017-09-25 DIAGNOSIS — Z881 Allergy status to other antibiotic agents status: Secondary | ICD-10-CM | POA: Diagnosis not present

## 2017-09-25 DIAGNOSIS — R1013 Epigastric pain: Secondary | ICD-10-CM | POA: Diagnosis not present

## 2017-09-25 DIAGNOSIS — R1084 Generalized abdominal pain: Secondary | ICD-10-CM | POA: Diagnosis not present

## 2017-09-25 DIAGNOSIS — Z4682 Encounter for fitting and adjustment of non-vascular catheter: Secondary | ICD-10-CM | POA: Diagnosis not present

## 2017-09-25 DIAGNOSIS — Z87442 Personal history of urinary calculi: Secondary | ICD-10-CM | POA: Diagnosis not present

## 2017-09-25 DIAGNOSIS — I129 Hypertensive chronic kidney disease with stage 1 through stage 4 chronic kidney disease, or unspecified chronic kidney disease: Secondary | ICD-10-CM | POA: Diagnosis present

## 2017-09-25 DIAGNOSIS — K209 Esophagitis, unspecified: Secondary | ICD-10-CM | POA: Diagnosis not present

## 2017-09-25 DIAGNOSIS — Z85038 Personal history of other malignant neoplasm of large intestine: Secondary | ICD-10-CM | POA: Diagnosis not present

## 2017-09-25 DIAGNOSIS — H409 Unspecified glaucoma: Secondary | ICD-10-CM | POA: Diagnosis not present

## 2017-09-25 DIAGNOSIS — K222 Esophageal obstruction: Secondary | ICD-10-CM | POA: Diagnosis not present

## 2017-09-25 DIAGNOSIS — N182 Chronic kidney disease, stage 2 (mild): Secondary | ICD-10-CM | POA: Diagnosis not present

## 2017-09-25 DIAGNOSIS — I10 Essential (primary) hypertension: Secondary | ICD-10-CM | POA: Diagnosis not present

## 2017-09-25 DIAGNOSIS — R1314 Dysphagia, pharyngoesophageal phase: Secondary | ICD-10-CM | POA: Diagnosis not present

## 2017-09-25 DIAGNOSIS — K2210 Ulcer of esophagus without bleeding: Secondary | ICD-10-CM | POA: Diagnosis not present

## 2017-09-25 DIAGNOSIS — Q394 Esophageal web: Secondary | ICD-10-CM | POA: Diagnosis not present

## 2017-09-25 DIAGNOSIS — J9811 Atelectasis: Secondary | ICD-10-CM | POA: Diagnosis not present

## 2017-09-25 DIAGNOSIS — E1121 Type 2 diabetes mellitus with diabetic nephropathy: Secondary | ICD-10-CM | POA: Diagnosis not present

## 2017-09-25 DIAGNOSIS — M1990 Unspecified osteoarthritis, unspecified site: Secondary | ICD-10-CM | POA: Diagnosis not present

## 2017-09-25 DIAGNOSIS — Z9049 Acquired absence of other specified parts of digestive tract: Secondary | ICD-10-CM | POA: Diagnosis not present

## 2017-09-25 DIAGNOSIS — K5909 Other constipation: Secondary | ICD-10-CM | POA: Diagnosis present

## 2017-09-25 DIAGNOSIS — F458 Other somatoform disorders: Secondary | ICD-10-CM | POA: Diagnosis present

## 2017-09-25 DIAGNOSIS — F19930 Other psychoactive substance use, unspecified with withdrawal, uncomplicated: Secondary | ICD-10-CM | POA: Diagnosis not present

## 2017-09-25 DIAGNOSIS — F419 Anxiety disorder, unspecified: Secondary | ICD-10-CM | POA: Diagnosis not present

## 2017-09-25 DIAGNOSIS — K8501 Idiopathic acute pancreatitis with uninfected necrosis: Secondary | ICD-10-CM | POA: Diagnosis not present

## 2017-09-25 DIAGNOSIS — N179 Acute kidney failure, unspecified: Secondary | ICD-10-CM | POA: Diagnosis not present

## 2017-09-25 DIAGNOSIS — Z885 Allergy status to narcotic agent status: Secondary | ICD-10-CM | POA: Diagnosis not present

## 2017-09-25 DIAGNOSIS — M1A9XX0 Chronic gout, unspecified, without tophus (tophi): Secondary | ICD-10-CM | POA: Diagnosis not present

## 2017-09-25 LAB — COMPREHENSIVE METABOLIC PANEL
ALBUMIN: 3.4 g/dL — AB (ref 3.5–5.0)
ALK PHOS: 83 U/L (ref 38–126)
ALT: 21 U/L (ref 14–54)
ANION GAP: 7 (ref 5–15)
AST: 39 U/L (ref 15–41)
BUN: 10 mg/dL (ref 6–20)
CALCIUM: 9.5 mg/dL (ref 8.9–10.3)
CO2: 32 mmol/L (ref 22–32)
Chloride: 101 mmol/L (ref 101–111)
Creatinine, Ser: 1.22 mg/dL — ABNORMAL HIGH (ref 0.44–1.00)
GFR calc Af Amer: 46 mL/min — ABNORMAL LOW (ref >60)
GFR calc non Af Amer: 40 mL/min — ABNORMAL LOW (ref >60)
GLUCOSE: 108 mg/dL — AB (ref 65–99)
Potassium: 4.1 mmol/L (ref 3.5–5.1)
SODIUM: 140 mmol/L (ref 135–145)
Total Bilirubin: 1.8 mg/dL — ABNORMAL HIGH (ref 0.3–1.2)
Total Protein: 6.7 g/dL (ref 6.5–8.1)

## 2017-09-25 LAB — CBC
HCT: 36.8 % (ref 36.0–46.0)
HEMOGLOBIN: 12.2 g/dL (ref 12.0–15.0)
MCH: 30 pg (ref 26.0–34.0)
MCHC: 33.2 g/dL (ref 30.0–36.0)
MCV: 90.4 fL (ref 78.0–100.0)
Platelets: 188 10*3/uL (ref 150–400)
RBC: 4.07 MIL/uL (ref 3.87–5.11)
RDW: 12.8 % (ref 11.5–15.5)
WBC: 4.9 10*3/uL (ref 4.0–10.5)

## 2017-09-25 LAB — GLUCOSE, CAPILLARY
GLUCOSE-CAPILLARY: 90 mg/dL (ref 65–99)
GLUCOSE-CAPILLARY: 79 mg/dL (ref 65–99)
Glucose-Capillary: 108 mg/dL — ABNORMAL HIGH (ref 65–99)
Glucose-Capillary: 100 mg/dL — ABNORMAL HIGH (ref 65–99)

## 2017-09-25 LAB — URINE CULTURE: Culture: 30000 — AB

## 2017-09-25 LAB — LIPASE, BLOOD: LIPASE: 70 U/L — AB (ref 11–51)

## 2017-09-25 MED ORDER — SODIUM CHLORIDE 0.9 % IV SOLN
INTRAVENOUS | Status: AC
Start: 2017-09-25 — End: 2017-09-25
  Administered 2017-09-25: 05:00:00 via INTRAVENOUS

## 2017-09-25 MED ORDER — ACETAMINOPHEN 650 MG RE SUPP: 650.0000 mg | Freq: Four times a day (QID) | RECTAL | Status: DC | PRN

## 2017-09-25 MED ORDER — ACETAMINOPHEN 325 MG PO TABS
650.0000 mg | ORAL_TABLET | Freq: Four times a day (QID) | ORAL | Status: DC | PRN
Start: 2017-09-25 — End: 2017-10-01
  Administered 2017-09-25 – 2017-10-01 (×3): 650 mg via ORAL
  Filled 2017-09-25 (×3): qty 2

## 2017-09-25 MED ORDER — LACTATED RINGERS IV SOLN
INTRAVENOUS | Status: AC
  Administered 2017-09-25: 17:00:00 via INTRAVENOUS

## 2017-09-25 MED ORDER — MORPHINE SULFATE (PF) 2 MG/ML IV SOLN
1.0000 mg | INTRAVENOUS | Status: DC | PRN
  Administered 2017-09-25 – 2017-10-01 (×19): 1 mg via INTRAVENOUS
  Filled 2017-09-25 (×19): qty 1

## 2017-09-25 NOTE — Progress Notes (Signed)
PROGRESS NOTE    Traci Clark  OAC:[id number] DOB: 04/14/1931 DOA: 09/24/2017 PCP: Rory Percy, MD   Brief Narrative:   Traci Clark is a 84 y.o. female with medical history significant for type 2 diabetes, hypertension, gout, anxiety, GERD, restless leg syndrome, colon cancer, and dysphagia with prior esophageal dilation who presents to the emergency department at the urging of her gastroenterologist after being seen in the office.  She was noted to have epigastric abdominal pain along with some nausea and vomiting and serum lipase elevation.  She has been admitted with possible mild pancreatitis versus peptic ulcer disease.   Assessment & Plan:   Principal Problem:   Intractable nausea and vomiting Active Problems:   GERD (gastroesophageal reflux disease)   RLS (restless legs syndrome)   Dysphagia, pharyngoesophageal phase   History of colon cancer   Diabetes ([place])   Essential hypertension   Gout   Anxiety   Pancreatitis   1. Intractable nausea and vomiting with epigastric abdominal pain.  This appears to be stabilizing currently and her lipase has decreased with no increase in LFTs.   She appears to have a mild pancreatitis versus possible peptic ulcer disease per GI and recommendations are to continue n.p.o. with IV fluid currently.  Records from [facility] rocking him are being obtained for further evaluation and possible consideration of EGD during hospitalization. 2. Dysphagia in the setting of prior esophageal dilation.  Per GI.  Continue n.p.o. except medication for now. 3. GERD.  Continue PPI. 4. Diabetes.  Continue home insulin with sliding scale at half dose of Lantus until diet is advanced.  Blood glucose readings stable. 5. Hypertension.  Hold HCTZ with hydralazine pushes as needed. BP stable. 6. Gout.  Colchicine as needed. 7. Anxiety.  Xanax as needed. 8. RLS.  Gabapentin.   DVT prophylaxis:Lovenox Code Status: Full Family Communication: Daughter at  bedside Disposition Plan: Pending further evaluation per GI   Consultants:   GI, Dr. Laural Golden  Procedures:   None  Antimicrobials:   None   Subjective: Patient seen and evaluated today with no new acute complaints or concerns. No acute concerns or events noted overnight.  Objective: Vitals:   09/24/17 1651 09/24/17 2002 09/24/17 2038 09/25/17 0600  BP: (!) 165/77 (!) 150/83  (!) 143/72  Pulse: 67 69  (!) 54  Resp:  20  17  Temp:  98.8 F (37.1 C)  98.6 F (37 C)  TempSrc:  Oral  Oral  SpO2:  97% 94% 96%  Weight:      Height:        Intake/Output Summary (Last 24 hours) at 09/25/2017 1129 Last data filed at 09/25/2017 0800 Gross per 24 hour  Intake 70 ml  Output 1000 ml  Net -930 ml   Filed Weights   09/24/17 1150 09/24/17 1553  Weight: 72.1 kg (159 lb) 71.2 kg (157 lb 1 oz)    Examination:  General exam: Appears calm and comfortable  Respiratory system: Clear to auscultation. Respiratory effort normal. Cardiovascular system: S1 & S2 heard, RRR. No JVD, murmurs, rubs, gallops or clicks. No pedal edema. Gastrointestinal system: Abdomen is nondistended, soft and nontender. No organomegaly or masses felt. Normal bowel sounds heard. Central nervous system: Alert and oriented. No focal neurological deficits. Extremities: Symmetric 5 x 5 power. Skin: No rashes, lesions or ulcers Psychiatry: Judgement and insight appear normal. Mood & affect appropriate.     Data Reviewed: I have personally reviewed following labs and imaging studies  CBC: Recent  Labs  Lab 09/24/17 1216 09/25/17 0613  WBC 4.7 4.9  NEUTROABS 1.9  --   HGB 12.7 12.2  HCT 38.4 36.8  MCV 89.3 90.4  PLT 197 409   Basic Metabolic Panel: Recent Labs  Lab 09/24/17 1216 09/25/17 0613  NA 138 140  K 3.7 4.1  CL 101 101  CO2 29 32  GLUCOSE 130* 108*  BUN 11 10  CREATININE 1.18* 1.22*  CALCIUM 9.7 9.5   GFR: Estimated Creatinine Clearance: 34.7 mL/min (A) (by C-G formula based on SCr of  1.22 mg/dL (H)). Liver Function Tests: Recent Labs  Lab 09/24/17 1216 09/25/17 0613  AST 34 39  ALT 21 21  ALKPHOS 95 83  BILITOT 1.2 1.8*  PROT 7.4 6.7  ALBUMIN 3.8 3.4*   Recent Labs  Lab 09/24/17 1216 09/25/17 0613  LIPASE 276* 70*   No results for input(s): AMMONIA in the last 168 hours. Coagulation Profile: No results for input(s): INR, PROTIME in the last 168 hours. Cardiac Enzymes: Recent Labs  Lab 09/24/17 1216  TROPONINI <0.03   BNP (last 3 results) No results for input(s): PROBNP in the last 8760 hours. HbA1C: No results for input(s): HGBA1C in the last 72 hours. CBG: Recent Labs  Lab 09/24/17 1648 09/24/17 2159 09/25/17 0743  GLUCAP 101* 118* 108*   Lipid Profile: No results for input(s): CHOL, HDL, LDLCALC, TRIG, CHOLHDL, LDLDIRECT in the last 72 hours. Thyroid Function Tests: No results for input(s): TSH, T4TOTAL, FREET4, T3FREE, THYROIDAB in the last 72 hours. Anemia Panel: No results for input(s): VITAMINB12, FOLATE, FERRITIN, TIBC, IRON, RETICCTPCT in the last 72 hours. Sepsis Labs: Recent Labs  Lab 09/24/17 1240 09/24/17 1415  LATICACIDVEN 1.51 1.3    Recent Results (from the past 240 hour(s))  Urine culture     Status: Abnormal (Preliminary result)   Collection Time: 09/24/17  1:43 PM  Result Value Ref Range Status   Specimen Description   Final    URINE, RANDOM Performed at [facility], [address] [place], [place] [address]    Special Requests   Final    NONE Performed at [facility], [address] [place], [place] [address]    Culture (A)  Final    30,000 COLONIES/mL UNIDENTIFIED ORGANISM Performed at [facility], [place] [address] [place], [place] [address]    Report Status PENDING  Incomplete      Radiology Studies: Dg Chest 2 View  Result Date: 09/24/2017 CLINICAL DATA:  Chest congestion, abdominal pain. History of colonic malignancy. EXAM: CHEST - 2 VIEW COMPARISON:  Chest x-ray of October 24, 2016 FINDINGS:  The lungs are well-expanded. There is no focal infiltrate. The lung markings are coarse at the left base which is a chronic finding. There is no pleural effusion. The heart and pulmonary vascularity are normal. The mediastinum is normal in width. The bony thorax exhibits no acute abnormality. IMPRESSION: Mild chronic bronchitic changes, stable. No acute cardiopulmonary abnormality. Electronically Signed   By: David  Martinique M.D.   On: 09/24/2017 13:17   Ct Soft Tissue Neck Wo Contrast  Result Date: 09/24/2017 CLINICAL DATA:  Difficulty swallowing since swallowing a fishbone on 09/20/2017. EXAM: CT NECK WITHOUT CONTRAST TECHNIQUE: Multidetector CT imaging of the neck was performed following the standard protocol without intravenous contrast. COMPARISON:  Cervical spine CT 05/23/2017 FINDINGS: Pharynx and larynx: Unchanged pharyngeal soft tissue contours compared to the prior CT without evidence of foreign body or mass on this unenhanced study. No fluid collection  or significant inflammatory changes in the parapharyngeal or retropharyngeal spaces. Salivary glands: No inflammation, mass, or stone. Thyroid: Unremarkable. Lymph nodes: No enlarged or suspicious lymph nodes in the neck. Vascular: Retropharyngeal course of the right internal carotid artery with associated impression on the posterior or pharyngeal wall. Mild calcified atherosclerosis at the right carotid bifurcation. Limited intracranial: Unremarkable. Visualized orbits: Unremarkable. Mastoids and visualized paranasal sinuses: Clear. Skeleton: The patient is edentulous. No acute osseous abnormality or suspicious osseous lesion is identified. Mild cervical disc degeneration is noted as well as advanced left C4-5 facet arthrosis. Upper chest: Clear lung apices. Other: None. IMPRESSION: No foreign body or other acute abnormality identified in the neck. Electronically Signed   By: Logan Bores M.D.   On: 09/24/2017 13:40   Ct Abdomen Pelvis W  Contrast  Result Date: 09/24/2017 CLINICAL DATA:  84 year old female with abdominal pain and constipation since 09/19/2017. History of colon cancer. Initial encounter. EXAM: CT ABDOMEN AND PELVIS WITH CONTRAST TECHNIQUE: Multidetector CT imaging of the abdomen and pelvis was performed using the standard protocol following bolus administration of intravenous contrast. CONTRAST:  184mL ISOVUE-300 IOPAMIDOL (ISOVUE-300) INJECTION 61% COMPARISON:  07/29/2017, 08/30/2016 and 11/16/2015 CT. FINDINGS: Lower chest: Minimal scarring lung bases.  Mild cardiomegaly. Hepatobiliary: Stable since 2017 are 8 mm hypodensity dome of the right lobe of liver and 1.1 cm hypodensity posterior aspect right lobe liver. Post cholecystectomy with intrahepatic and extrahepatic biliary duct dilation. Degree of biliary duct prominence appears to have progressed slightly when compared to 2017 examination. No calcified common bile duct stone or obstructing mass. It is possible this is normal for this patient however if there were any abnormal liver enzymes, further investigation with MRCP/ERCP may be considered. Pancreas: No pancreatic mass or inflammation. Spleen: No splenic mass or enlargement. Adrenals/Urinary Tract: No hydronephrosis or obstructing stone. Multiple renal lesions. Majority of these are cysts. As noted on recent CT, increase in size of hyperdense structure within the left upper pole measuring 3.4 cm with Hounsfield units of 37 (series 7, image 11). It is possible this represents a hyperdense cyst but would require MR with and without contrast to confirm such and excludes cystic mass. If this exam is performed, attention to hyperdense 1.6 cm lesion medial aspect right kidney (series 7, image 16). This appears of similar size to prior exams. Noncontrast filled views the urinary bladder unremarkable. Stomach/Bowel: Partial colonic resection. No extraluminal inflammatory process. Distal transverse colon narrowing may be related to  under distension (series 2, images 40 and 41). However, this appears slightly hyperdense, subtle inflammation could not excluded in proper clinical setting. Underlying mass less likely consideration as this appeared normal on 07/30/2027 CT. Vascular/Lymphatic: Atherosclerotic changes of ectatic aorta without focal aneurysm. Mild dilation distal common iliac arteries measuring up to 1.8 cm on the right 1.7 cm on the left. No large vessel occlusion. No adenopathy. Reproductive: No worrisome uterine or adnexal mass. Other: No free intraperitoneal air or bowel containing hernia. Musculoskeletal: Severe left hip joint degenerative changes with subchondral cysts. Moderate right hip joint degenerative changes. Degenerative changes lower thoracic and lumbar spine. IMPRESSION: Partial colonic resection. No extraluminal inflammatory process. Distal transverse colon narrowing may be related to under distension (series 2, images 40 and 41). However, this appears slightly hyperdense and subtle inflammation could not excluded in proper clinical setting. Underlying mass less likely consideration as this appeared normal on 07/30/2027 CT. Post cholecystectomy with intrahepatic and extrahepatic biliary duct dilation. It is possible this is normal for this patient however  if there were abnormal liver enzymes, further investigation with MRCP/ERCP may be considered. Hyperdense renal lesions as noted on recent CT. Dedicated renal MR with contrast would be necessary for further delineation if clinically desired. Aortic Atherosclerosis (ICD10-I70.0). Ectatic aorta. No focal aortic aneurysm. Slightly dilated distal common iliac arteries. Marked bilateral hip joint degenerative change greater on left. Electronically Signed   By: Genia Del M.D.   On: 09/24/2017 14:11     Scheduled Meds: . aspirin EC  81 mg Oral Daily  . enoxaparin (LOVENOX) injection  40 mg Subcutaneous Q24H  . gabapentin  300 mg Oral QHS  . insulin aspart  4-10  Units Subcutaneous TID AC  . insulin glargine  5 Units Subcutaneous QHS  . pantoprazole  40 mg Oral Daily   Continuous Infusions: . sodium chloride 75 mL/hr at 09/25/17 0520     LOS: 0 days    Time spent: 30 minutes    Sohana Austell Darleen Crocker, DO [facility] Pager [phone]  If 7PM-7AM, please contact night-coverage [email or web address] Password [email or web address] 09/25/2017, 11:29 AM

## 2017-09-25 NOTE — Progress Notes (Addendum)
Patient ID: Traci Clark, female   DOB: [birth year], 84 y.o.   MRN: [id number] Family in room. She states she does not feel any better. She continues to have nausea. No BM since admission. Lipase on admission 276. Down to 86 today. She underwent and EGD in 2017 for LUQ pain, dysphagia, Nausea and vomiting. Benign appearing esophageal stenosis Dilated. Whitish plaques in the middle third of the esophagus consistent with mild Candida esophagitis.  Three small hyperplastic appearing gastric polyps.    CMP Latest Ref Rng & Units 09/25/2017 09/24/2017 12/10/2016  Glucose 65 - 99 mg/dL 108(H) 130(H) 168(H)  BUN 6 - 20 mg/dL 10 11 10   Creatinine 0.44 - 1.00 mg/dL 1.22(H) 1.18(H) 1.12(H)  Sodium 135 - 145 mmol/L 140 138 136  Potassium 3.5 - 5.1 mmol/L 4.1 3.7 4.4  Chloride 101 - 111 mmol/L 101 101 100(L)  CO2 22 - 32 mmol/L 32 29 28  Calcium 8.9 - 10.3 mg/dL 9.5 9.7 9.4  Total Protein 6.5 - 8.1 g/dL 6.7 7.4 -  Total Bilirubin 0.3 - 1.2 mg/dL 1.8(H) 1.2 -  Alkaline Phos 38 - 126 U/L 83 95 -  AST 15 - 41 U/L 39 34 -  ALT 14 - 54 U/L 21 21 -    Assessment/Plan: Epigastric pain ,nausea and vomiting.  Lipase is coming down. Possible EGD this admission.   GI attending note. Patient continues to complain of epigastric and periumbilical pain. She has had nausea but no vomiting.  She is thirsty. She has not had a BM since admission. Abdominal examination reveals normal bowel sounds soft abdomen with mild tenderness centered around horizontal scar in upper abdomen.  No organomegaly or masses  Imaging studies reviewed with Dr. Thornton Papas. No evidence of pancreatitis. CBD mildly dilated.  Intrahepatic biliary radicles are more prominent on the study compared to one from June 2017. Patient's transaminases remain normal.  Bilirubin is mildly elevated but felt to be insignificant.  Therefore cannot say that she has SOD dysfunction.  Recommendations: Hold Lovenox today. Use mechanical DVT prophylaxis. Clear  liquids. Esophagogastroduodenoscopy and possible esophageal dilation in a.m. LFTs in a.m.

## 2017-09-26 ENCOUNTER — Other Ambulatory Visit: Payer: Self-pay

## 2017-09-26 ENCOUNTER — Inpatient Hospital Stay (HOSPITAL_COMMUNITY): Payer: Medicare HMO | Admitting: Anesthesiology

## 2017-09-26 ENCOUNTER — Encounter (HOSPITAL_COMMUNITY): Payer: Self-pay | Admitting: Emergency Medicine

## 2017-09-26 ENCOUNTER — Encounter (HOSPITAL_COMMUNITY)
Admission: EM | Disposition: A | Discharge status: Home / Self Care | Payer: Self-pay | Source: Home / Self Care | Attending: Internal Medicine

## 2017-09-26 DIAGNOSIS — K222 Esophageal obstruction: Secondary | ICD-10-CM

## 2017-09-26 DIAGNOSIS — K317 Polyp of stomach and duodenum: Secondary | ICD-10-CM

## 2017-09-26 DIAGNOSIS — Q394 Esophageal web: Secondary | ICD-10-CM

## 2017-09-26 DIAGNOSIS — K449 Diaphragmatic hernia without obstruction or gangrene: Secondary | ICD-10-CM

## 2017-09-26 DIAGNOSIS — K209 Esophagitis, unspecified: Secondary | ICD-10-CM

## 2017-09-26 HISTORY — PX: ESOPHAGOGASTRODUODENOSCOPY: SHX5428

## 2017-09-26 HISTORY — PX: ESOPHAGEAL DILATION: SHX303

## 2017-09-26 LAB — COMPREHENSIVE METABOLIC PANEL
ALT: 29 U/L (ref 14–54)
ANION GAP: 8 (ref 5–15)
AST: 50 U/L — ABNORMAL HIGH (ref 15–41)
Albumin: 3.4 g/dL — ABNORMAL LOW (ref 3.5–5.0)
Alkaline Phosphatase: 86 U/L (ref 38–126)
BUN: 11 mg/dL (ref 6–20)
CHLORIDE: 101 mmol/L (ref 101–111)
CO2: 29 mmol/L (ref 22–32)
Calcium: 9.3 mg/dL (ref 8.9–10.3)
Creatinine, Ser: 1.21 mg/dL — ABNORMAL HIGH (ref 0.44–1.00)
GFR calc non Af Amer: 40 mL/min — ABNORMAL LOW (ref >60)
GFR, EST AFRICAN AMERICAN: 46 mL/min — AB (ref >60)
Glucose, Bld: 96 mg/dL (ref 65–99)
POTASSIUM: 4 mmol/L (ref 3.5–5.1)
SODIUM: 138 mmol/L (ref 135–145)
Total Bilirubin: 2 mg/dL — ABNORMAL HIGH (ref 0.3–1.2)
Total Protein: 6.3 g/dL — ABNORMAL LOW (ref 6.5–8.1)

## 2017-09-26 LAB — GLUCOSE, CAPILLARY
GLUCOSE-CAPILLARY: 100 mg/dL — AB (ref 65–99)
GLUCOSE-CAPILLARY: 76 mg/dL (ref 65–99)
GLUCOSE-CAPILLARY: 142 mg/dL — AB (ref 65–99)
GLUCOSE-CAPILLARY: 188 mg/dL — AB (ref 65–99)
Glucose-Capillary: 100 mg/dL — ABNORMAL HIGH (ref 65–99)
Glucose-Capillary: 126 mg/dL — ABNORMAL HIGH (ref 65–99)

## 2017-09-26 LAB — BILIRUBIN, FRACTIONATED(TOT/DIR/INDIR)
Bilirubin, Direct: 0.3 mg/dL (ref 0.1–0.5)
Indirect Bilirubin: 1.8 mg/dL — ABNORMAL HIGH (ref 0.3–0.9)
Total Bilirubin: 2.1 mg/dL — ABNORMAL HIGH (ref 0.3–1.2)

## 2017-09-26 LAB — CBC
HEMATOCRIT: 36.1 % (ref 36.0–46.0)
HEMOGLOBIN: 11.9 g/dL — AB (ref 12.0–15.0)
MCH: 29.8 pg (ref 26.0–34.0)
MCHC: 33 g/dL (ref 30.0–36.0)
MCV: 90.3 fL (ref 78.0–100.0)
Platelets: 164 10*3/uL (ref 150–400)
RBC: 4 MIL/uL (ref 3.87–5.11)
RDW: 12.7 % (ref 11.5–15.5)
WBC: 4.4 10*3/uL (ref 4.0–10.5)

## 2017-09-26 SURGERY — EGD (ESOPHAGOGASTRODUODENOSCOPY)
Anesthesia: Moderate Sedation

## 2017-09-26 MED ORDER — MIDAZOLAM HCL 5 MG/5ML IJ SOLN
INTRAMUSCULAR | Status: DC | PRN
  Administered 2017-09-26 (×3): 1 mg via INTRAVENOUS

## 2017-09-26 MED ORDER — MIDAZOLAM HCL 5 MG/5ML IJ SOLN
INTRAMUSCULAR | Status: AC
  Filled 2017-09-26: qty 10

## 2017-09-26 MED ORDER — PANTOPRAZOLE SODIUM 40 MG PO TBEC
40.0000 mg | DELAYED_RELEASE_TABLET | Freq: Two times a day (BID) | ORAL | Status: DC
  Administered 2017-09-26 – 2017-10-01 (×9): 40 mg via ORAL
  Filled 2017-09-26 (×9): qty 1

## 2017-09-26 MED ORDER — MEPERIDINE HCL 50 MG/ML IJ SOLN
INTRAMUSCULAR | Status: DC | PRN
  Administered 2017-09-26 (×2): 25 mg via INTRAVENOUS

## 2017-09-26 MED ORDER — DEXTROSE 50 % IV SOLN
INTRAVENOUS | Status: AC
  Filled 2017-09-26: qty 50

## 2017-09-26 MED ORDER — LIDOCAINE VISCOUS 2 % MT SOLN
OROMUCOSAL | Status: DC | PRN
  Administered 2017-09-26: 5 mL via OROMUCOSAL

## 2017-09-26 MED ORDER — SODIUM CHLORIDE 0.9 % IV SOLN
INTRAVENOUS | Status: DC
  Administered 2017-09-26: 06:00:00 via INTRAVENOUS

## 2017-09-26 MED ORDER — STERILE WATER FOR IRRIGATION IR SOLN
Status: DC | PRN
  Administered 2017-09-26: 2.5 mL

## 2017-09-26 MED ORDER — SENNOSIDES-DOCUSATE SODIUM 8.6-50 MG PO TABS
1.0000 | ORAL_TABLET | Freq: Two times a day (BID) | ORAL | Status: DC
  Administered 2017-09-26 – 2017-10-01 (×10): 1 via ORAL
  Filled 2017-09-26 (×12): qty 1

## 2017-09-26 MED ORDER — MEPERIDINE HCL 50 MG/ML IJ SOLN
INTRAMUSCULAR | Status: AC
  Filled 2017-09-26: qty 1

## 2017-09-26 MED ORDER — LIDOCAINE VISCOUS 2 % MT SOLN
OROMUCOSAL | Status: AC
  Filled 2017-09-26: qty 15

## 2017-09-26 MED ORDER — DEXTROSE 50 % IV SOLN
12.5000 g | Freq: Once | INTRAVENOUS | Status: AC
  Administered 2017-09-26: 12.5 g via INTRAVENOUS

## 2017-09-26 MED ORDER — BISACODYL 10 MG RE SUPP
10.0000 mg | Freq: Once | RECTAL | Status: AC
  Administered 2017-09-26: 10 mg via RECTAL
  Filled 2017-09-26: qty 1

## 2017-09-26 NOTE — Op Note (Signed)
[facility] Patient Name: Traci Clark Procedure Date: 09/26/2017 3:08 PM MRN: [id number] Date of Birth: [birth year] Attending MD: Hildred Laser , MD CSN: [id number] Age: 84 Admit Type: Inpatient Procedure:                Upper GI endoscopy Indications:              Epigastric abdominal pain, Esophageal dysphagia,                            Nausea with vomiting Providers:                Hildred Laser, MD, Charlsie Quest. Theda Sers RN, RN,                            Gwynneth Albright RN, RN, Aram Candela Referring MD:             Rory Percy, MD Medicines:                Lidocaine spray, Meperidine 50 mg IV, Midazolam 3                            mg IV Complications:            No immediate complications. Estimated Blood Loss:     Estimated blood loss was minimal. Procedure:                Pre-Anesthesia Assessment:                           - Prior to the procedure, a History and Physical                            was performed, and patient medications and                            allergies were reviewed. The patient's tolerance of                            previous anesthesia was also reviewed. The risks                            and benefits of the procedure and the sedation                            options and risks were discussed with the patient.                            All questions were answered, and informed consent                            was obtained. Prior Anticoagulants: The patient                            last took Lovenox (enoxaparin) 1 day prior to the  procedure and last took aspirin on the day of the                            procedure. ASA Grade Assessment: II - A patient                            with mild systemic disease. After reviewing the                            risks and benefits, the patient was deemed in                            satisfactory condition to undergo the procedure.                           After  obtaining informed consent, the endoscope was                            passed under direct vision. Throughout the                            procedure, the patient's blood pressure, pulse, and                            oxygen saturations were monitored continuously. The                            EG-[id number] ([id number]) scope was introduced through the                            mouth, and advanced to the second part of duodenum.                            The upper GI endoscopy was accomplished without                            difficulty. The patient tolerated the procedure                            well. 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      LA Grade B (one or more mucosal breaks greater than 5 mm, not extending       between the tops of two mucosal folds) esophagitis was found 32 to 35 cm       from the incisors. Biopsies were taken with a cold forceps for histology.      One benign-appearing, intrinsic mild stenosis was found 37 to 38 cm from       the incisors. This stenosis measured 1.2 cm (inner diameter) x 1 cm (in       length). The stenosis was traversed. The scope was withdrawn. Dilation       was performed with a Maloney dilator with  mild resistance at 54 Fr. The       dilation site was examined following endoscope reinsertion and showed       moderate improvement in luminal narrowing and no perforation.      The Z-line was regular and was found 38 cm from the incisors.      A 2 cm hiatal hernia was present.      A few 3 to 6 mm sessile polyps were found in the gastric fundus and in       the gastric body.      The duodenal bulb and second portion of the duodenum were normal. Impression:               - Web in the proximal esophagus. Dilated.                           - LA Grade B esophagitis. Biopsied.                           - Benign-appearing  distal esophageal stenosis.                            Dilated.                           - Z-line regular, 38 cm from the incisors.                           - 2 cm hiatal hernia.                           - A few gastric polyps. Left alone.                           - Normal duodenal bulb and second portion of the                            duodenum. Moderate Sedation:      Moderate (conscious) sedation was administered by the endoscopy nurse       and supervised by the endoscopist. The following parameters were       monitored: oxygen saturation, heart rate, blood pressure, CO2       capnography and response to care. Total physician intraservice time was       17 minutes. Recommendation:           - Return patient to hospital ward for ongoing care.                           - Diabetic (ADA) diet today.                           - Continue present medications.                           - Resume aspirin at prior dose in 3 days.                           - Await pathology results.  Procedure Code(s):        --- Professional ---                           [phone], Esophagogastroduodenoscopy, flexible,                            transoral; with biopsy, single or multiple                           43450, Dilation of esophagus, by unguided sound or                            bougie, single or multiple passes                           G0500, Moderate sedation services provided by the                            same physician or other qualified health care                            professional performing a gastrointestinal                            endoscopic service that sedation supports,                            requiring the presence of an independent trained                            observer to assist in the monitoring of the                            patient's level of consciousness and physiological                            status; initial 15 minutes of intra-service time;                             patient age 49 years or older (additional time may                            be reported with [phone], as appropriate) Diagnosis Code(s):        --- Professional ---                           Q39.4, Esophageal web                           K20.9, Esophagitis, unspecified                           K22.2, Esophageal obstruction  K44.9, Diaphragmatic hernia without obstruction or                            gangrene                           K31.7, Polyp of stomach and duodenum                           R10.13, Epigastric pain                           R13.14, Dysphagia, pharyngoesophageal phase                           R11.2, Nausea with vomiting, unspecified CPT copyright 2017 American Medical Association. All rights reserved. The codes documented in this report are preliminary and upon coder review may  be revised to meet current compliance requirements. Hildred Laser, MD Hildred Laser, MD 09/26/2017 3:57:59 PM This report has been signed electronically. Number of Addenda: 0

## 2017-09-26 NOTE — Plan of Care (Signed)
Pt at current time has no nausea or vomiting noted at this time. Pt reports being able to hold dinner down after eating. Patient self reports improvement at this time.

## 2017-09-26 NOTE — Progress Notes (Signed)
PROGRESS NOTE    Traci Clark  PPJ:[id number] DOB: January 28, 1931 DOA: 09/24/2017 PCP: Rory Percy, MD   Brief Narrative:   Traci Clark is a 84 y.o. female with medical history significant for type 2 diabetes, hypertension, gout, anxiety, GERD, restless leg syndrome, colon cancer, and dysphagia with prior esophageal dilation who presents to the emergency department at the urging of her gastroenterologist after being seen in the office.  She was noted to have epigastric abdominal pain along with some nausea and vomiting and serum lipase elevation.  She has been admitted with possible mild pancreatitis versus peptic ulcer disease.   Assessment & Plan:   Principal Problem:   Intractable nausea and vomiting Active Problems:   GERD (gastroesophageal reflux disease)   RLS (restless legs syndrome)   Dysphagia, pharyngoesophageal phase   History of colon cancer   Diabetes ([place])   Essential hypertension   Gout   Anxiety   Pancreatitis   1. Intractable nausea and vomiting with epigastric abdominal pain.  This appears to be stabilizing currently and her lipase has decreased with no increase in LFTs.   She appears to have a mild pancreatitis versus possible peptic ulcer disease per GI and recommendations are to continue CLD with IV fluid currently.  EGD planned for today for dilation and further evaluation of PUD. 2. Dysphagia in the setting of prior esophageal dilation.  Per GI.  Continue CLD and anticipate advancement after EGD. 3. Constipation. Bisacodyl suppository. 4. GERD.  Continue PPI. 5. Diabetes.  Continue home insulin with sliding scale at half dose of Lantus until diet is advanced.  Blood glucose readings stable. 6. Hypertension.  Hold HCTZ with hydralazine pushes as needed. BP stable. 7. Gout.  Colchicine as needed. 8. Anxiety.  Xanax as needed. 9. RLS.  Gabapentin.   DVT prophylaxis:Lovenox Code Status: Full Family Communication: Daughter at bedside Disposition Plan:  Pending further evaluation per GI   Consultants:   GI, Dr. Laural Golden  Procedures:   None  Antimicrobials:   None   Subjective: Patient seen and evaluated today with no new acute complaints or concerns. No acute concerns or events noted overnight. She continues to have some trouble with abdominal pain, nausea, and constipation.  Objective: Vitals:   09/25/17 1653 09/25/17 2052 09/25/17 2056 09/26/17 0633  BP: (!) 152/74 (!) 148/69 (!) 146/66 127/78  Pulse: (!) 58 (!) 59 (!) 57 71  Resp: 18 20 20 18   Temp: 98.8 F (37.1 C) 98.9 F (37.2 C) 98.8 F (37.1 C) 98.4 F (36.9 C)  TempSrc: Oral Oral Oral Oral  SpO2: 93% 94% 92% 98%  Weight:      Height:        Intake/Output Summary (Last 24 hours) at 09/26/2017 1208 Last data filed at 09/26/2017 1035 Gross per 24 hour  Intake 1638.67 ml  Output 400 ml  Net 1238.67 ml   Filed Weights   09/24/17 1150 09/24/17 1553  Weight: 72.1 kg (159 lb) 71.2 kg (157 lb 1 oz)    Examination:  General exam: Appears calm and comfortable  Respiratory system: Clear to auscultation. Respiratory effort normal. Cardiovascular system: S1 & S2 heard, RRR. No JVD, murmurs, rubs, gallops or clicks. No pedal edema. Gastrointestinal system: Abdomen is nondistended, soft and nontender. No organomegaly or masses felt. Normal bowel sounds heard. Central nervous system: Alert and oriented. No focal neurological deficits. Extremities: Symmetric 5 x 5 power. Skin: No rashes, lesions or ulcers Psychiatry: Judgement and insight appear normal. Mood & affect appropriate.  Data Reviewed: I have personally reviewed following labs and imaging studies  CBC: Recent Labs  Lab 09/24/17 1216 09/25/17 0613 09/26/17 0616  WBC 4.7 4.9 4.4  NEUTROABS 1.9  --   --   HGB 12.7 12.2 11.9*  HCT 38.4 36.8 36.1  MCV 89.3 90.4 90.3  PLT 197 188 503   Basic Metabolic Panel: Recent Labs  Lab 09/24/17 1216 09/25/17 0613 09/26/17 0616  NA 138 140 138  K 3.7  4.1 4.0  CL 101 101 101  CO2 29 32 29  GLUCOSE 130* 108* 96  BUN 11 10 11   CREATININE 1.18* 1.22* 1.21*  CALCIUM 9.7 9.5 9.3   GFR: Estimated Creatinine Clearance: 35 mL/min (A) (by C-G formula based on SCr of 1.21 mg/dL (H)). Liver Function Tests: Recent Labs  Lab 09/24/17 1216 09/25/17 0613 09/26/17 0616  AST 34 39 50*  ALT 21 21 29   ALKPHOS 95 83 86  BILITOT 1.2 1.8* 2.1*  2.0*  PROT 7.4 6.7 6.3*  ALBUMIN 3.8 3.4* 3.4*   Recent Labs  Lab 09/24/17 1216 09/25/17 0613  LIPASE 276* 70*   No results for input(s): AMMONIA in the last 168 hours. Coagulation Profile: No results for input(s): INR, PROTIME in the last 168 hours. Cardiac Enzymes: Recent Labs  Lab 09/24/17 1216  TROPONINI <0.03   BNP (last 3 results) No results for input(s): PROBNP in the last 8760 hours. HbA1C: No results for input(s): HGBA1C in the last 72 hours. CBG: Recent Labs  Lab 09/25/17 1149 09/25/17 1648 09/25/17 2047 09/26/17 0738 09/26/17 1124  GLUCAP 90 79 100* 100* 100*   Lipid Profile: No results for input(s): CHOL, HDL, LDLCALC, TRIG, CHOLHDL, LDLDIRECT in the last 72 hours. Thyroid Function Tests: No results for input(s): TSH, T4TOTAL, FREET4, T3FREE, THYROIDAB in the last 72 hours. Anemia Panel: No results for input(s): VITAMINB12, FOLATE, FERRITIN, TIBC, IRON, RETICCTPCT in the last 72 hours. Sepsis Labs: Recent Labs  Lab 09/24/17 1240 09/24/17 1415  LATICACIDVEN 1.51 1.3    Recent Results (from the past 240 hour(s))  Urine culture     Status: Abnormal   Collection Time: 09/24/17  1:43 PM  Result Value Ref Range Status   Specimen Description   Final    URINE, RANDOM Performed at [facility], [address], [place], [place] [address]    Special Requests   Final    NONE Performed at [facility], [address], [place], [place] [address]    Culture (A)  Final    30,000 COLONIES/mL GROUP B STREP(S.AGALACTIAE)ISOLATED TESTING AGAINST S. AGALACTIAE NOT ROUTINELY  PERFORMED DUE TO PREDICTABILITY OF AMP/PEN/VAN SUSCEPTIBILITY. Performed at [facility], [place] [address], [place], [place] [address]    Report Status 09/25/2017 FINAL  Final      Radiology Studies: Dg Chest 2 View  Result Date: 09/24/2017 CLINICAL DATA:  Chest congestion, abdominal pain. History of colonic malignancy. EXAM: CHEST - 2 VIEW COMPARISON:  Chest x-ray of October 24, 2016 FINDINGS: The lungs are well-expanded. There is no focal infiltrate. The lung markings are coarse at the left base which is a chronic finding. There is no pleural effusion. The heart and pulmonary vascularity are normal. The mediastinum is normal in width. The bony thorax exhibits no acute abnormality. IMPRESSION: Mild chronic bronchitic changes, stable. No acute cardiopulmonary abnormality. Electronically Signed   By: David  Martinique M.D.   On: 09/24/2017 13:17   Ct Soft Tissue Neck Wo Contrast  Result Date: 09/24/2017 CLINICAL DATA:  Difficulty swallowing since swallowing a fishbone on 09/20/2017. EXAM: CT NECK WITHOUT CONTRAST TECHNIQUE: Multidetector CT imaging of the neck was performed following the standard protocol without intravenous contrast. COMPARISON:  Cervical spine CT 05/23/2017 FINDINGS: Pharynx and larynx: Unchanged pharyngeal soft tissue contours compared to the prior CT without evidence of foreign body or mass on this unenhanced study. No fluid collection or significant inflammatory changes in the parapharyngeal or retropharyngeal spaces. Salivary glands: No inflammation, mass, or stone. Thyroid: Unremarkable. Lymph nodes: No enlarged or suspicious lymph nodes in the neck. Vascular: Retropharyngeal course of the right internal carotid artery with associated impression on the posterior or pharyngeal wall. Mild calcified atherosclerosis at the right carotid bifurcation. Limited intracranial: Unremarkable. Visualized orbits: Unremarkable. Mastoids and visualized paranasal sinuses: Clear. Skeleton: The patient  is edentulous. No acute osseous abnormality or suspicious osseous lesion is identified. Mild cervical disc degeneration is noted as well as advanced left C4-5 facet arthrosis. Upper chest: Clear lung apices. Other: None. IMPRESSION: No foreign body or other acute abnormality identified in the neck. Electronically Signed   By: Logan Bores M.D.   On: 09/24/2017 13:40   Ct Abdomen Pelvis W Contrast  Result Date: 09/24/2017 CLINICAL DATA:  84 year old female with abdominal pain and constipation since 09/19/2017. History of colon cancer. Initial encounter. EXAM: CT ABDOMEN AND PELVIS WITH CONTRAST TECHNIQUE: Multidetector CT imaging of the abdomen and pelvis was performed using the standard protocol following bolus administration of intravenous contrast. CONTRAST:  188mL ISOVUE-300 IOPAMIDOL (ISOVUE-300) INJECTION 61% COMPARISON:  07/29/2017, 08/30/2016 and 11/16/2015 CT. FINDINGS: Lower chest: Minimal scarring lung bases.  Mild cardiomegaly. Hepatobiliary: Stable since 2017 are 8 mm hypodensity dome of the right lobe of liver and 1.1 cm hypodensity posterior aspect right lobe liver. Post cholecystectomy with intrahepatic and extrahepatic biliary duct dilation. Degree of biliary duct prominence appears to have progressed slightly when compared to 2017 examination. No calcified common bile duct stone or obstructing mass. It is possible this is normal for this patient however if there were any abnormal liver enzymes, further investigation with MRCP/ERCP may be considered. Pancreas: No pancreatic mass or inflammation. Spleen: No splenic mass or enlargement. Adrenals/Urinary Tract: No hydronephrosis or obstructing stone. Multiple renal lesions. Majority of these are cysts. As noted on recent CT, increase in size of hyperdense structure within the left upper pole measuring 3.4 cm with Hounsfield units of 37 (series 7, image 11). It is possible this represents a hyperdense cyst but would require MR with and without  contrast to confirm such and excludes cystic mass. If this exam is performed, attention to hyperdense 1.6 cm lesion medial aspect right kidney (series 7, image 16). This appears of similar size to prior exams. Noncontrast filled views the urinary bladder unremarkable. Stomach/Bowel: Partial colonic resection. No extraluminal inflammatory process. Distal transverse colon narrowing may be related to under distension (series 2, images 40 and 41). However, this appears slightly hyperdense, subtle inflammation could not excluded in proper clinical setting. Underlying mass less likely consideration as this appeared normal on 07/30/2027 CT. Vascular/Lymphatic: Atherosclerotic changes of ectatic aorta without focal aneurysm. Mild dilation distal common iliac arteries measuring up to 1.8 cm on the right 1.7 cm on the left. No large vessel occlusion. No adenopathy. Reproductive: No worrisome uterine or adnexal mass. Other: No free intraperitoneal air or bowel containing hernia. Musculoskeletal: Severe left hip joint degenerative changes with subchondral cysts. Moderate right hip joint degenerative changes. Degenerative changes lower thoracic and lumbar spine. IMPRESSION: Partial colonic resection. No extraluminal inflammatory  process. Distal transverse colon narrowing may be related to under distension (series 2, images 40 and 41). However, this appears slightly hyperdense and subtle inflammation could not excluded in proper clinical setting. Underlying mass less likely consideration as this appeared normal on 07/30/2027 CT. Post cholecystectomy with intrahepatic and extrahepatic biliary duct dilation. It is possible this is normal for this patient however if there were abnormal liver enzymes, further investigation with MRCP/ERCP may be considered. Hyperdense renal lesions as noted on recent CT. Dedicated renal MR with contrast would be necessary for further delineation if clinically desired. Aortic Atherosclerosis  (ICD10-I70.0). Ectatic aorta. No focal aortic aneurysm. Slightly dilated distal common iliac arteries. Marked bilateral hip joint degenerative change greater on left. Electronically Signed   By: Genia Del M.D.   On: 09/24/2017 14:11     Scheduled Meds: . aspirin EC  81 mg Oral Daily  . enoxaparin (LOVENOX) injection  40 mg Subcutaneous Q24H  . gabapentin  300 mg Oral QHS  . insulin aspart  4-10 Units Subcutaneous TID AC  . insulin glargine  5 Units Subcutaneous QHS  . pantoprazole  40 mg Oral Daily   Continuous Infusions: . sodium chloride 20 mL/hr at 09/26/17 0600     LOS: 1 day    Time spent: 30 minutes    Bonni Neuser Darleen Crocker, DO [facility] Pager [phone]  If 7PM-7AM, please contact night-coverage [email or web address] Password [email or web address] 09/26/2017, 12:08 PM

## 2017-09-26 NOTE — Progress Notes (Signed)
Pt cbg 76. Dr Laural Golden aware and vo to give half amp D50. vo read back and verified.

## 2017-09-26 NOTE — Progress Notes (Signed)
Lab results noted and discussed with Dr. Manuella Ghazi. Bilirubin is 2.1.  Indirect is 1.8. Indirect hyperbilirubinemia most likely due to Gilbert's syndrome. AST is 50 with ALT of 29. Mildly elevated AST may not be from hepatic source.   Source of patient's abdominal pain remains unclear.  Will repeat LFTs in a.m. along with serum lipase. Patient begun on Peri-Colace 1 tablet twice daily for constipation.  Dr. Gala Romney will be seen patient over the weekend.

## 2017-09-26 NOTE — Progress Notes (Addendum)
EGD note.  Esophageal web. Noncritical distal esophageal stricture proximal to GE junction. Multiple erosions noted and mid-esophagus; biopsy taken post dilation. Small sliding hiatal hernia. Few gastric polyps and body and fundus.  These were left alone. Esophagus dilated by passing 50 French Maloney dilator resulting in mucosal disruption and distal esophagus and obliteration of web.

## 2017-09-26 NOTE — Care Management Important Message (Signed)
Important Message  Patient Details  Name: Traci Clark MRN: [id number] Date of Birth: 1931-04-18   Medicare Important Message Given:  Yes    Sherald Barge, RN 09/26/2017, 10:12 AM

## 2017-09-27 DIAGNOSIS — R112 Nausea with vomiting, unspecified: Secondary | ICD-10-CM

## 2017-09-27 DIAGNOSIS — K8501 Idiopathic acute pancreatitis with uninfected necrosis: Secondary | ICD-10-CM

## 2017-09-27 LAB — COMPREHENSIVE METABOLIC PANEL
ALT: 24 U/L (ref 14–54)
AST: 33 U/L (ref 15–41)
Albumin: 3.2 g/dL — ABNORMAL LOW (ref 3.5–5.0)
Alkaline Phosphatase: 77 U/L (ref 38–126)
Anion gap: 6 (ref 5–15)
BUN: 12 mg/dL (ref 6–20)
CO2: 30 mmol/L (ref 22–32)
Calcium: 9.2 mg/dL (ref 8.9–10.3)
Chloride: 103 mmol/L (ref 101–111)
Creatinine, Ser: 1.34 mg/dL — ABNORMAL HIGH (ref 0.44–1.00)
GFR, EST AFRICAN AMERICAN: 41 mL/min — AB (ref >60)
GFR, EST NON AFRICAN AMERICAN: 35 mL/min — AB (ref >60)
Glucose, Bld: 147 mg/dL — ABNORMAL HIGH (ref 65–99)
POTASSIUM: 3.8 mmol/L (ref 3.5–5.1)
SODIUM: 139 mmol/L (ref 135–145)
Total Bilirubin: 1.4 mg/dL — ABNORMAL HIGH (ref 0.3–1.2)
Total Protein: 6.2 g/dL — ABNORMAL LOW (ref 6.5–8.1)

## 2017-09-27 LAB — LIPASE, BLOOD: LIPASE: 29 U/L (ref 11–51)

## 2017-09-27 LAB — CBC
HCT: 34.4 % — ABNORMAL LOW (ref 36.0–46.0)
Hemoglobin: 11.4 g/dL — ABNORMAL LOW (ref 12.0–15.0)
MCH: 29.8 pg (ref 26.0–34.0)
MCHC: 33.1 g/dL (ref 30.0–36.0)
MCV: 89.8 fL (ref 78.0–100.0)
PLATELETS: 184 10*3/uL (ref 150–400)
RBC: 3.83 MIL/uL — ABNORMAL LOW (ref 3.87–5.11)
RDW: 12.5 % (ref 11.5–15.5)
WBC: 4.7 10*3/uL (ref 4.0–10.5)

## 2017-09-27 LAB — GLUCOSE, CAPILLARY
GLUCOSE-CAPILLARY: 156 mg/dL — AB (ref 65–99)
GLUCOSE-CAPILLARY: 102 mg/dL — AB (ref 65–99)
GLUCOSE-CAPILLARY: 153 mg/dL — AB (ref 65–99)
Glucose-Capillary: 128 mg/dL — ABNORMAL HIGH (ref 65–99)

## 2017-09-27 MED ORDER — LACTATED RINGERS IV SOLN
INTRAVENOUS | Status: AC
  Administered 2017-09-27: 17:00:00 via INTRAVENOUS

## 2017-09-27 MED ORDER — PANTOPRAZOLE SODIUM 40 MG PO TBEC: 40.0000 mg | DELAYED_RELEASE_TABLET | Freq: Two times a day (BID) | ORAL | 0 refills | Status: DC

## 2017-09-27 MED ORDER — SENNOSIDES-DOCUSATE SODIUM 8.6-50 MG PO TABS: 1.0000 | ORAL_TABLET | Freq: Two times a day (BID) | ORAL | 0 refills | Status: AC

## 2017-09-27 NOTE — Plan of Care (Signed)
progressing 

## 2017-09-27 NOTE — Progress Notes (Signed)
PROGRESS NOTE    Traci Clark  POE:[id number] DOB: Aug 23, 1930 DOA: 09/24/2017 PCP: Rory Percy, MD   Brief Narrative:   Traci Clark is a 84 y.o. female with medical history significant for type 2 diabetes, hypertension, gout, anxiety, GERD, restless leg syndrome, colon cancer, and dysphagia with prior esophageal dilation who presents to the emergency department at the urging of her gastroenterologist after being seen in the office.  She was noted to have epigastric abdominal pain along with some nausea and vomiting and serum lipase elevation.  She has been admitted with possible mild pancreatitis versus peptic ulcer disease.  She has now undergone EGD with middle esophageal dilation and is thought to have Gilbert's syndrome vs sphincter of Oddi dysfunction.  She unfortunately continues to have some more nausea and vomiting today.   Assessment & Plan:   Principal Problem:   Nausea and vomiting in adult patient Active Problems:   GERD (gastroesophageal reflux disease)   RLS (restless legs syndrome)   Dysphagia, pharyngoesophageal phase   History of colon cancer   Diabetes ([place])   Essential hypertension   Gout   Anxiety   Pancreatitis   1. Intractable nausea and vomiting with epigastric abdominal pain.  She was being planned for discharge today as she was initially feeling better, but now continues to have worsening nausea and vomiting.  We will continue to observe overnight and keep on IV fluid. 2. Dysphagia in the setting of prior esophageal dilation.  Per GI.    Continue on soft diet as tolerated. 3. Constipation. Bisacodyl suppository. 4. GERD.  Continue PPI. 5. Diabetes.  Continue home insulin with sliding scale at half dose of Lantus until diet is advanced.  Blood glucose readings stable. 6. Hypertension.  Hold HCTZ with hydralazine pushes as needed. BP stable. 7. Gout.  Colchicine as needed. 8. Anxiety.  Xanax as needed. 9. RLS.  Gabapentin.   DVT  prophylaxis:Lovenox Code Status: Full Family Communication: Daughter at bedside Disposition Plan: Pending further evaluation per GI   Consultants:   GI, Dr. Laural Golden  Procedures:   None  Antimicrobials:   None   Subjective: Patient seen and evaluated today with no new acute complaints or concerns.  She was actually feeling better this morning and was anticipating discharge which was established.  Unfortunately she continued to have worsening nausea and vomiting this afternoon.  Objective: Vitals:   09/26/17 1604 09/26/17 2018 09/27/17 0451 09/27/17 0756  BP:  (!) 155/62 124/74   Pulse:  64 (!) 56   Resp: 20     Temp:  98.7 F (37.1 C) 98.7 F (37.1 C)   TempSrc:  Oral Oral   SpO2:  97% 98% 94%  Weight:      Height:        Intake/Output Summary (Last 24 hours) at 09/27/2017 1635 Last data filed at 09/26/2017 2017 Gross per 24 hour  Intake 240 ml  Output 400 ml  Net -160 ml   Filed Weights   09/24/17 1150 09/24/17 1553  Weight: 72.1 kg (159 lb) 71.2 kg (157 lb 1 oz)    Examination:  General exam: Appears calm and comfortable  Respiratory system: Clear to auscultation. Respiratory effort normal. Cardiovascular system: S1 & S2 heard, RRR. No JVD, murmurs, rubs, gallops or clicks. No pedal edema. Gastrointestinal system: Abdomen is nondistended, soft and nontender. No organomegaly or masses felt. Normal bowel sounds heard. Central nervous system: Alert and oriented. No focal neurological deficits. Extremities: Symmetric 5 x 5 power. Skin:  No rashes, lesions or ulcers Psychiatry: Judgement and insight appear normal. Mood & affect appropriate.     Data Reviewed: I have personally reviewed following labs and imaging studies  CBC: Recent Labs  Lab 09/24/17 1216 09/25/17 0613 09/26/17 0616 09/27/17 0505  WBC 4.7 4.9 4.4 4.7  NEUTROABS 1.9  --   --   --   HGB 12.7 12.2 11.9* 11.4*  HCT 38.4 36.8 36.1 34.4*  MCV 89.3 90.4 90.3 89.8  PLT 197 188 164 242    Basic Metabolic Panel: Recent Labs  Lab 09/24/17 1216 09/25/17 0613 09/26/17 0616 09/27/17 0505  NA 138 140 138 139  K 3.7 4.1 4.0 3.8  CL 101 101 101 103  CO2 29 32 29 30  GLUCOSE 130* 108* 96 147*  BUN 11 10 11 12   CREATININE 1.18* 1.22* 1.21* 1.34*  CALCIUM 9.7 9.5 9.3 9.2   GFR: Estimated Creatinine Clearance: 31.6 mL/min (A) (by C-G formula based on SCr of 1.34 mg/dL (H)). Liver Function Tests: Recent Labs  Lab 09/24/17 1216 09/25/17 0613 09/26/17 0616 09/27/17 0505  AST 34 39 50* 33  ALT 21 21 29 24   ALKPHOS 95 83 86 77  BILITOT 1.2 1.8* 2.1*  2.0* 1.4*  PROT 7.4 6.7 6.3* 6.2*  ALBUMIN 3.8 3.4* 3.4* 3.2*   Recent Labs  Lab 09/24/17 1216 09/25/17 0613 09/27/17 0505  LIPASE 276* 70* 29   No results for input(s): AMMONIA in the last 168 hours. Coagulation Profile: No results for input(s): INR, PROTIME in the last 168 hours. Cardiac Enzymes: Recent Labs  Lab 09/24/17 1216  TROPONINI <0.03   BNP (last 3 results) No results for input(s): PROBNP in the last 8760 hours. HbA1C: No results for input(s): HGBA1C in the last 72 hours. CBG: Recent Labs  Lab 09/26/17 1507 09/26/17 1547 09/26/17 2114 09/27/17 0743 09/27/17 1145  GLUCAP 142* 126* 188* 128* 156*   Lipid Profile: No results for input(s): CHOL, HDL, LDLCALC, TRIG, CHOLHDL, LDLDIRECT in the last 72 hours. Thyroid Function Tests: No results for input(s): TSH, T4TOTAL, FREET4, T3FREE, THYROIDAB in the last 72 hours. Anemia Panel: No results for input(s): VITAMINB12, FOLATE, FERRITIN, TIBC, IRON, RETICCTPCT in the last 72 hours. Sepsis Labs: Recent Labs  Lab 09/24/17 1240 09/24/17 1415  LATICACIDVEN 1.51 1.3    Recent Results (from the past 240 hour(s))  Urine culture     Status: Abnormal   Collection Time: 09/24/17  1:43 PM  Result Value Ref Range Status   Specimen Description   Final    URINE, RANDOM Performed at [facility], [address], [place], [place] [address]     Special Requests   Final    NONE Performed at [facility], [address] [place], [place] [address]    Culture (A)  Final    30,000 COLONIES/mL GROUP B STREP(S.AGALACTIAE)ISOLATED TESTING AGAINST S. AGALACTIAE NOT ROUTINELY PERFORMED DUE TO PREDICTABILITY OF AMP/PEN/VAN SUSCEPTIBILITY. Performed at [facility], [place] [address], [place], [place] [address]    Report Status 09/25/2017 FINAL  Final      Radiology Studies: No results found.   Scheduled Meds: . gabapentin  300 mg Oral QHS  . insulin aspart  4-10 Units Subcutaneous TID AC  . insulin glargine  5 Units Subcutaneous QHS  . pantoprazole  40 mg Oral BID AC  . senna-docusate  1 tablet Oral BID   Continuous Infusions:    LOS: 2 days    Time spent: 30 minutes  Pratik Darleen Crocker, DO [facility] Pager [phone]  If 7PM-7AM, please contact night-coverage [email or web address] Password [facility] 09/27/2017, 4:35 PM

## 2017-09-27 NOTE — Progress Notes (Signed)
Patient reports very minimal abdominal pain. She is advancing her diet slowly on her own.  Vital signs in last 24 hours: Temp:  [97.7 F (36.5 C)-98.7 F (37.1 C)] 98.7 F (37.1 C) (05/11 0451) Pulse Rate:  [56-66] 56 (05/11 0451) Resp:  [12-20] 20 (05/10 1604) BP: (119-180)/(62-97) 124/74 (05/11 0451) SpO2:  [94 %-100 %] 94 % (05/11 0756) Last BM Date: 09/26/17 General:   Alert,   pleasant and cooperative in NAD Abdomen:  Soft, nontender and nondistended.  Normal bowel sounds, without guarding, and without rebound.  No mass or organomegaly. Extremities:  Without clubbing or edema.    Intake/Output from previous day: 05/10 0701 - 05/11 0700 In: 577.7 [P.O.:240; I.V.:337.7] Out: 1000 [Urine:1000] Intake/Output this shift: No intake/output data recorded.  Lab Results: Recent Labs    09/25/17 0613 09/26/17 0616 09/27/17 0505  WBC 4.9 4.4 4.7  HGB 12.2 11.9* 11.4*  HCT 36.8 36.1 34.4*  PLT 188 164 184   BMET Recent Labs    09/25/17 0613 09/26/17 0616 09/27/17 0505  NA 140 138 139  K 4.1 4.0 3.8  CL 101 101 103  CO2 32 29 30  GLUCOSE 108* 96 147*  BUN 10 11 12   CREATININE 1.22* 1.21* 1.34*  CALCIUM 9.5 9.3 9.2   LFT Recent Labs    09/26/17 0616 09/27/17 0505  PROT 6.3* 6.2*  ALBUMIN 3.4* 3.2*  AST 50* 33  ALT 29 24  ALKPHOS 86 77  BILITOT 2.1*  2.0* 1.4*  BILIDIR 0.3  --   IBILI 1.8*  --    PT/INR No results for input(s): LABPROT, INR in the last 72 hours. Hepatitis Panel No results for input(s): HEPBSAG, HCVAB, HEPAIGM, HEPBIGM in the last 72 hours. C-Diff No results for input(s): CDIFFTOX in the last 72 hours.  Studies/Results: No results found.  Impression:  Acute illness characterized by nausea vomiting abdominal pain , elevated LFTs and lipase - improved.    If she recently had a bout of pancreatitis it was mild with no parenchymal abnormalities seen on cross-sectional imaging. Bump in LFTs not typical of transient obstruction although it is  not out of the question. [name] syndrome may certainly be in the mix in the setting of acute illness.  With short interval changes in aminotransferases, albeit mild, transient partial biliary obstruction not excluded ( query sphincter of Oddi dysfunction versus microlithiasis)  Fortunately, she is clinically improved  Recommendations:  Close interval follow-up with Dr. Laural Golden to be arranged.  If recurrent illness, would consider MR imaging of her pancreaticobiliary tree.      Traci Clark  09/27/2017, 11:07 AM

## 2017-09-27 NOTE — Progress Notes (Signed)
Patient reported that she had vomited 2 times since lunch. I notified the doctor and he cancelled the discharge order. We will reevaluate tomorrow

## 2017-09-27 NOTE — Discharge Summary (Signed)
Physician Discharge Summary  Traci Clark:[id number] DOB: 05/05/1931 DOA: 09/24/2017  PCP: Rory Percy, MD  Admit date: 09/24/2017  Discharge date: 09/27/2017  Admitted From:Home  Disposition:  Home  Recommendations for Outpatient Follow-up:  1. Follow up with PCP in 4 weeks 2. Follow up with GI Dr. Laural Golden in 2 weeks  Home Health:N/A  Equipment/Devices:None  Discharge Condition:Stable  CODE STATUS: Full  Diet recommendation: Heart Healthy/Carb modified; soft and advance as tolerated  Brief/Interim Summary:  Traci Clark a 84 y.o.femalewith medical history significant fortype 2 diabetes, hypertension, gout, anxiety, GERD, restless leg syndrome, colon cancer, and dysphagia with prior esophageal dilation who presents to the emergency department at the urging of her gastroenterologist after being seen in the office.  She was noted to have epigastric abdominal pain along with some nausea and vomiting and serum lipase elevation.  She has been admitted with possible mild pancreatitis versus peptic ulcer disease.  She was seen by GI while inpatient and had undergone EGD with esophageal dilation on 5/10.  She was noted to have some mild stenosis in her mid esophagus.  She does continue to have some mild LFTs and is thought to have possible Gilbert syndrome versus sphincter of Oddi dysfunction versus microlithiasis.  She is clinically improved and tolerating a diet and has had bowel movements with no further nausea or vomiting.  She is recommended to follow-up closely with her gastroenterologist Dr. Laural Golden in the next 1 to 2 weeks.   Discharge Diagnoses:  Principal Problem:   Nausea and vomiting in adult patient Active Problems:   GERD (gastroesophageal reflux disease)   RLS (restless legs syndrome)   Dysphagia, pharyngoesophageal phase   History of colon cancer   Diabetes ([place])   Essential hypertension   Gout   Anxiety   Pancreatitis  1. Intractable nausea and vomiting  with epigastric abdominal pain-resolved.  She has undergone EGD on 5/10 with some dilation to her midesophagus and is now tolerating diet.  She continues to have some persistent LFT elevations that are mild but appears clinically irrelevant.  She is thought to have some possible Gilbert's disease versus sphincter of Oddi dysfunction versus microlithiasis and will follow up closely with her gastroenterologist in the next 1 to 2 weeks. 2. Dysphagia in the setting of prior esophageal dilation. Status post endoscopy with repeat dilation on 5/10.  Currently tolerating diet. 3. Constipation.  Resolved after suppository.  Senokot on discharge. 4. GERD. Continue PPI as prescribed twice daily. 5. Diabetes. Continue home insulin. 6. Hypertension. Continue home hydrochlorothiazide. 7. Gout. Colchicine as needed. 8. Anxiety.Xanax as needed. 9. RLS. Gabapentin.  Discharge Instructions  Discharge Instructions    Call MD for:  persistant nausea and vomiting   Complete by:  As directed    Diet - low sodium heart healthy   Complete by:  As directed    Increase activity slowly   Complete by:  As directed      Allergies as of 09/27/2017      Reactions   Amoxicillin Anaphylaxis, Rash   "Skin peeled off"   Macrobid [nitrofurantoin Monohyd Macro] Anaphylaxis, Rash, Other (See Comments)   "Skin peeled off"   Codeine Nausea And Vomiting, Other (See Comments)   shaking   Darvon [propoxyphene] Nausea And Vomiting, Other (See Comments)   shaking      Medication List    STOP taking these medications   esomeprazole 40 MG capsule Commonly known as:  [place] by:  pantoprazole 40 MG tablet  TAKE these medications   acetaminophen 325 MG tablet Commonly known as:  TYLENOL Take 650 mg by mouth every 6 (six) hours as needed for pain.   ALPRAZolam 0.5 MG tablet Commonly known as:  XANAX Take 0.5 mg by mouth at bedtime as needed for sleep.   aspirin 81 MG tablet Take 1 tablet (81 mg  total) by mouth daily.   colchicine 0.6 MG tablet Take 0.6 mg by mouth as needed.   gabapentin 300 MG capsule Commonly known as:  NEURONTIN Take 300 mg by mouth at bedtime.   HAIR SKIN AND NAILS FORMULA Tabs Take 1 tablet by mouth every morning.   hydrochlorothiazide 25 MG tablet Commonly known as:  HYDRODIURIL Take 25 mg by mouth daily.   insulin glargine 100 UNIT/ML injection Commonly known as:  LANTUS Inject 10 Units into the skin at bedtime.   insulin lispro 100 UNIT/ML injection Commonly known as:  HUMALOG Inject 4-10 Units into the skin 3 (three) times daily before meals. Sliding scale 151- 200=4 units; 201-250= 6 units; 251-300= 8 units; 301-350= 10 units.   meclizine 25 MG tablet Commonly known as:  ANTIVERT Take 25 mg by mouth 3 (three) times daily as needed for dizziness.   ondansetron 8 MG disintegrating tablet Commonly known as:  ZOFRAN-ODT Take 1 tablet by mouth every 6 (six) hours as needed for nausea or vomiting.   pantoprazole 40 MG tablet Commonly known as:  PROTONIX Take 1 tablet (40 mg total) by mouth 2 (two) times daily before a meal. Replaces:  esomeprazole 40 MG capsule   polyethylene glycol packet Commonly known as:  MIRALAX / GLYCOLAX Take 17 g by mouth daily.   senna-docusate 8.6-50 MG tablet Commonly known as:  Senokot-S Take 1 tablet by mouth 2 (two) times daily.      Follow-up Information    Rory Percy, MD Follow up in 4 week(s).   Specialty:  Family Medicine Contact information: [address] [place] [address] [phone]        Rogene Houston, MD Follow up in 2 week(s).   Specialty:  Gastroenterology Contact information: [address], [address] [place] [address] [phone]          Allergies  Allergen Reactions  . Amoxicillin Anaphylaxis and Rash    "Skin peeled off"  . Macrobid [Nitrofurantoin Monohyd Macro] Anaphylaxis, Rash and Other (See Comments)    "Skin peeled off"  . Codeine Nausea And Vomiting  and Other (See Comments)    shaking  . Darvon [Propoxyphene] Nausea And Vomiting and Other (See Comments)    shaking    Consultations:  GI   Procedures/Studies: Dg Chest 2 View  Result Date: 09/24/2017 CLINICAL DATA:  Chest congestion, abdominal pain. History of colonic malignancy. EXAM: CHEST - 2 VIEW COMPARISON:  Chest x-ray of October 24, 2016 FINDINGS: The lungs are well-expanded. There is no focal infiltrate. The lung markings are coarse at the left base which is a chronic finding. There is no pleural effusion. The heart and pulmonary vascularity are normal. The mediastinum is normal in width. The bony thorax exhibits no acute abnormality. IMPRESSION: Mild chronic bronchitic changes, stable. No acute cardiopulmonary abnormality. Electronically Signed   By: David  Martinique M.D.   On: 09/24/2017 13:17   Ct Soft Tissue Neck Wo Contrast  Result Date: 09/24/2017 CLINICAL DATA:  Difficulty swallowing since swallowing a fishbone on 09/20/2017. EXAM: CT NECK WITHOUT CONTRAST TECHNIQUE: Multidetector CT imaging of the neck was performed following the  standard protocol without intravenous contrast. COMPARISON:  Cervical spine CT 05/23/2017 FINDINGS: Pharynx and larynx: Unchanged pharyngeal soft tissue contours compared to the prior CT without evidence of foreign body or mass on this unenhanced study. No fluid collection or significant inflammatory changes in the parapharyngeal or retropharyngeal spaces. Salivary glands: No inflammation, mass, or stone. Thyroid: Unremarkable. Lymph nodes: No enlarged or suspicious lymph nodes in the neck. Vascular: Retropharyngeal course of the right internal carotid artery with associated impression on the posterior or pharyngeal wall. Mild calcified atherosclerosis at the right carotid bifurcation. Limited intracranial: Unremarkable. Visualized orbits: Unremarkable. Mastoids and visualized paranasal sinuses: Clear. Skeleton: The patient is edentulous. No acute osseous  abnormality or suspicious osseous lesion is identified. Mild cervical disc degeneration is noted as well as advanced left C4-5 facet arthrosis. Upper chest: Clear lung apices. Other: None. IMPRESSION: No foreign body or other acute abnormality identified in the neck. Electronically Signed   By: Logan Bores M.D.   On: 09/24/2017 13:40   Ct Abdomen Pelvis W Contrast  Result Date: 09/24/2017 CLINICAL DATA:  84 year old female with abdominal pain and constipation since 09/19/2017. History of colon cancer. Initial encounter. EXAM: CT ABDOMEN AND PELVIS WITH CONTRAST TECHNIQUE: Multidetector CT imaging of the abdomen and pelvis was performed using the standard protocol following bolus administration of intravenous contrast. CONTRAST:  153mL ISOVUE-300 IOPAMIDOL (ISOVUE-300) INJECTION 61% COMPARISON:  07/29/2017, 08/30/2016 and 11/16/2015 CT. FINDINGS: Lower chest: Minimal scarring lung bases.  Mild cardiomegaly. Hepatobiliary: Stable since 2017 are 8 mm hypodensity dome of the right lobe of liver and 1.1 cm hypodensity posterior aspect right lobe liver. Post cholecystectomy with intrahepatic and extrahepatic biliary duct dilation. Degree of biliary duct prominence appears to have progressed slightly when compared to 2017 examination. No calcified common bile duct stone or obstructing mass. It is possible this is normal for this patient however if there were any abnormal liver enzymes, further investigation with MRCP/ERCP may be considered. Pancreas: No pancreatic mass or inflammation. Spleen: No splenic mass or enlargement. Adrenals/Urinary Tract: No hydronephrosis or obstructing stone. Multiple renal lesions. Majority of these are cysts. As noted on recent CT, increase in size of hyperdense structure within the left upper pole measuring 3.4 cm with Hounsfield units of 37 (series 7, image 11). It is possible this represents a hyperdense cyst but would require MR with and without contrast to confirm such and excludes  cystic mass. If this exam is performed, attention to hyperdense 1.6 cm lesion medial aspect right kidney (series 7, image 16). This appears of similar size to prior exams. Noncontrast filled views the urinary bladder unremarkable. Stomach/Bowel: Partial colonic resection. No extraluminal inflammatory process. Distal transverse colon narrowing may be related to under distension (series 2, images 40 and 41). However, this appears slightly hyperdense, subtle inflammation could not excluded in proper clinical setting. Underlying mass less likely consideration as this appeared normal on 07/30/2027 CT. Vascular/Lymphatic: Atherosclerotic changes of ectatic aorta without focal aneurysm. Mild dilation distal common iliac arteries measuring up to 1.8 cm on the right 1.7 cm on the left. No large vessel occlusion. No adenopathy. Reproductive: No worrisome uterine or adnexal mass. Other: No free intraperitoneal air or bowel containing hernia. Musculoskeletal: Severe left hip joint degenerative changes with subchondral cysts. Moderate right hip joint degenerative changes. Degenerative changes lower thoracic and lumbar spine. IMPRESSION: Partial colonic resection. No extraluminal inflammatory process. Distal transverse colon narrowing may be related to under distension (series 2, images 40 and 41). However, this appears slightly hyperdense and subtle  inflammation could not excluded in proper clinical setting. Underlying mass less likely consideration as this appeared normal on 07/30/2027 CT. Post cholecystectomy with intrahepatic and extrahepatic biliary duct dilation. It is possible this is normal for this patient however if there were abnormal liver enzymes, further investigation with MRCP/ERCP may be considered. Hyperdense renal lesions as noted on recent CT. Dedicated renal MR with contrast would be necessary for further delineation if clinically desired. Aortic Atherosclerosis (ICD10-I70.0). Ectatic aorta. No focal aortic  aneurysm. Slightly dilated distal common iliac arteries. Marked bilateral hip joint degenerative change greater on left. Electronically Signed   By: Genia Del M.D.   On: 09/24/2017 14:11   Discharge Exam: Vitals:   09/27/17 0451 09/27/17 0756  BP: 124/74   Pulse: (!) 56   Resp:    Temp: 98.7 F (37.1 C)   SpO2: 98% 94%   Vitals:   09/26/17 1604 09/26/17 2018 09/27/17 0451 09/27/17 0756  BP:  (!) 155/62 124/74   Pulse:  64 (!) 56   Resp: 20     Temp:  98.7 F (37.1 C) 98.7 F (37.1 C)   TempSrc:  Oral Oral   SpO2:  97% 98% 94%  Weight:      Height:        General: Pt is alert, awake, not in acute distress Cardiovascular: RRR, S1/S2 +, no rubs, no gallops Respiratory: CTA bilaterally, no wheezing, no rhonchi Abdominal: Soft, NT, ND, bowel sounds + Extremities: no edema, no cyanosis    The results of significant diagnostics from this hospitalization (including imaging, microbiology, ancillary and laboratory) are listed below for reference.     Microbiology: Recent Results (from the past 240 hour(s))  Urine culture     Status: Abnormal   Collection Time: 09/24/17  1:43 PM  Result Value Ref Range Status   Specimen Description   Final    URINE, RANDOM Performed at [facility], [address], [place], [place] [address]    Special Requests   Final    NONE Performed at [facility], [address], [place], [place] [address]    Culture (A)  Final    30,000 COLONIES/mL GROUP B STREP(S.AGALACTIAE)ISOLATED TESTING AGAINST S. AGALACTIAE NOT ROUTINELY PERFORMED DUE TO PREDICTABILITY OF AMP/PEN/VAN SUSCEPTIBILITY. Performed at [facility], [place] [address] [place], [place] [address]    Report Status 09/25/2017 FINAL  Final     Labs: BNP (last 3 results) No results for input(s): BNP in the last 8760 hours. Basic Metabolic Panel: Recent Labs  Lab 09/24/17 1216 09/25/17 0613 09/26/17 0616 09/27/17 0505  NA 138 140 138 139  K 3.7 4.1 4.0 3.8  CL 101 101  101 103  CO2 29 32 29 30  GLUCOSE 130* 108* 96 147*  BUN 11 10 11 12   CREATININE 1.18* 1.22* 1.21* 1.34*  CALCIUM 9.7 9.5 9.3 9.2   Liver Function Tests: Recent Labs  Lab 09/24/17 1216 09/25/17 0613 09/26/17 0616 09/27/17 0505  AST 34 39 50* 33  ALT 21 21 29 24   ALKPHOS 95 83 86 77  BILITOT 1.2 1.8* 2.1*  2.0* 1.4*  PROT 7.4 6.7 6.3* 6.2*  ALBUMIN 3.8 3.4* 3.4* 3.2*   Recent Labs  Lab 09/24/17 1216 09/25/17 0613 09/27/17 0505  LIPASE 276* 70* 29   No results for input(s): AMMONIA in the last 168 hours. CBC: Recent Labs  Lab 09/24/17 1216 09/25/17 0613 09/26/17 0616 09/27/17 0505  WBC 4.7 4.9 4.4 4.7  NEUTROABS 1.9  --   --   --  HGB 12.7 12.2 11.9* 11.4*  HCT 38.4 36.8 36.1 34.4*  MCV 89.3 90.4 90.3 89.8  PLT 197 188 164 184   Cardiac Enzymes: Recent Labs  Lab 09/24/17 1216  TROPONINI <0.03   BNP: Invalid input(s): POCBNP CBG: Recent Labs  Lab 09/26/17 1421 09/26/17 1507 09/26/17 1547 09/26/17 2114 09/27/17 0743  GLUCAP 76 142* 126* 188* 128*   D-Dimer No results for input(s): DDIMER in the last 72 hours. Hgb A1c No results for input(s): HGBA1C in the last 72 hours. Lipid Profile No results for input(s): CHOL, HDL, LDLCALC, TRIG, CHOLHDL, LDLDIRECT in the last 72 hours. Thyroid function studies No results for input(s): TSH, T4TOTAL, T3FREE, THYROIDAB in the last 72 hours.  Invalid input(s): FREET3 Anemia work up No results for input(s): VITAMINB12, FOLATE, FERRITIN, TIBC, IRON, RETICCTPCT in the last 72 hours. Urinalysis    Component Value Date/Time   COLORURINE STRAW (A) 09/24/2017 1343   APPEARANCEUR CLEAR 09/24/2017 1343   LABSPEC 1.016 09/24/2017 1343   PHURINE 7.0 09/24/2017 1343   GLUCOSEU NEGATIVE 09/24/2017 1343   HGBUR NEGATIVE 09/24/2017 1343   BILIRUBINUR NEGATIVE 09/24/2017 1343   KETONESUR NEGATIVE 09/24/2017 1343   PROTEINUR NEGATIVE 09/24/2017 1343   UROBILINOGEN 0.2 06/27/2014 1044   NITRITE NEGATIVE 09/24/2017  1343   LEUKOCYTESUR NEGATIVE 09/24/2017 1343   Sepsis Labs Invalid input(s): PROCALCITONIN,  WBC,  LACTICIDVEN Microbiology Recent Results (from the past 240 hour(s))  Urine culture     Status: Abnormal   Collection Time: 09/24/17  1:43 PM  Result Value Ref Range Status   Specimen Description   Final    URINE, RANDOM Performed at [facility], [address] [place], [place] [address]    Special Requests   Final    NONE Performed at [facility], [address], [place], [place] [address]    Culture (A)  Final    30,000 COLONIES/mL GROUP B STREP(S.AGALACTIAE)ISOLATED TESTING AGAINST S. AGALACTIAE NOT ROUTINELY PERFORMED DUE TO PREDICTABILITY OF AMP/PEN/VAN SUSCEPTIBILITY. Performed at [facility], [place] [address] [place], [place] [address]    Report Status 09/25/2017 FINAL  Final     Time coordinating discharge: 35 minutes  SIGNED:   Rodena Goldmann, DO [facility] 09/27/2017, 11:40 AM Pager [phone]  If 7PM-7AM, please contact night-coverage [email or web address] Password [email or web address]

## 2017-09-28 LAB — GLUCOSE, CAPILLARY
GLUCOSE-CAPILLARY: 120 mg/dL — AB (ref 65–99)
Glucose-Capillary: 128 mg/dL — ABNORMAL HIGH (ref 65–99)
Glucose-Capillary: 100 mg/dL — ABNORMAL HIGH (ref 65–99)
Glucose-Capillary: 137 mg/dL — ABNORMAL HIGH (ref 65–99)

## 2017-09-28 MED ORDER — LACTATED RINGERS IV SOLN
INTRAVENOUS | Status: AC
  Administered 2017-09-28: 12:00:00 via INTRAVENOUS

## 2017-09-28 NOTE — Plan of Care (Signed)
Patient is afebrile, IV site is within defined parameters at current time. Family at bedside for supervision. Patient call for assistance.

## 2017-09-28 NOTE — Progress Notes (Signed)
PROGRESS NOTE    Traci Clark  ASN:[id number] DOB: Aug 02, 1930 DOA: 09/24/2017 PCP: Rory Percy, MD   Brief Narrative:   Traci Clark is a 84 y.o. female with medical history significant for type 2 diabetes, hypertension, gout, anxiety, GERD, restless leg syndrome, colon cancer, and dysphagia with prior esophageal dilation who presents to the emergency department at the urging of her gastroenterologist after being seen in the office.  She was noted to have epigastric abdominal pain along with some nausea and vomiting and serum lipase elevation.  She has been admitted with possible mild pancreatitis versus peptic ulcer disease.  She has now undergone EGD with middle esophageal dilation and is thought to have Gilbert's syndrome vs sphincter of Oddi dysfunction.  She unfortunately continues to have some more nausea and vomiting along with abdominal pain.   Assessment & Plan:   Principal Problem:   Nausea and vomiting in adult patient Active Problems:   GERD (gastroesophageal reflux disease)   RLS (restless legs syndrome)   Dysphagia, pharyngoesophageal phase   History of colon cancer   Diabetes ([place])   Essential hypertension   Gout   Anxiety   Pancreatitis   1. Intractable nausea and vomiting with epigastric abdominal pain.  She was being planned for discharge on 5/11 as she was initially feeling better, but now continues to have worsening nausea and vomiting.  We will continue to observe and consider MR imaging by AM per GI recommendations if symptoms persist. Appreciate further GI recommendations. Placed back on CLD as tolerated. IVF to continue. 2. Dysphagia in the setting of prior esophageal dilation.  Per GI.    Continue on soft diet as tolerated. 3. Constipation. Bisacodyl suppository. 4. GERD.  Continue PPI. 5. Diabetes.  Continue home insulin with sliding scale at half dose of Lantus until diet is advanced.  Blood glucose readings stable. 6. Hypertension.  Hold HCTZ with  hydralazine pushes as needed. BP borderline. 7. Gout.  Colchicine as needed. 8. Anxiety.  Xanax as needed. 9. RLS.  Gabapentin.   DVT prophylaxis:Lovenox Code Status: Full Family Communication: Daughter at bedside Disposition Plan: Pending further evaluation per GI   Consultants:   GI, Dr. Laural Golden  Procedures:   None  Antimicrobials:   None   Subjective: Patient seen and evaluated today with ongoing symptoms of nausea and abdominal pain.  She cannot tolerate her diet.  No acute events noted overnight.  Objective: Vitals:   09/27/17 0451 09/27/17 0756 09/27/17 2100 09/28/17 0600  BP: 124/74  (!) 172/88 (!) 159/84  Pulse: (!) 56  (!) 54 (!) 53  Resp:    18  Temp: 98.7 F (37.1 C)  98.5 F (36.9 C) 98.6 F (37 C)  TempSrc: Oral  Oral Oral  SpO2: 98% 94% 97% 99%  Weight:      Height:        Intake/Output Summary (Last 24 hours) at 09/28/2017 1229 Last data filed at 09/28/2017 0900 Gross per 24 hour  Intake 420 ml  Output 200 ml  Net 220 ml   Filed Weights   09/24/17 1150 09/24/17 1553  Weight: 72.1 kg (159 lb) 71.2 kg (157 lb 1 oz)    Examination:  General exam: Appears calm and comfortable  Respiratory system: Clear to auscultation. Respiratory effort normal. Cardiovascular system: S1 & S2 heard, RRR. No JVD, murmurs, rubs, gallops or clicks. No pedal edema. Gastrointestinal system: Abdomen is nondistended, soft and nontender. No organomegaly or masses felt. Normal bowel sounds heard. Central nervous  system: Alert and oriented. No focal neurological deficits. Extremities: Symmetric 5 x 5 power. Skin: No rashes, lesions or ulcers Psychiatry: Judgement and insight appear normal. Mood & affect appropriate.     Data Reviewed: I have personally reviewed following labs and imaging studies  CBC: Recent Labs  Lab 09/24/17 1216 09/25/17 0613 09/26/17 0616 09/27/17 0505  WBC 4.7 4.9 4.4 4.7  NEUTROABS 1.9  --   --   --   HGB 12.7 12.2 11.9* 11.4*  HCT  38.4 36.8 36.1 34.4*  MCV 89.3 90.4 90.3 89.8  PLT 197 188 164 161   Basic Metabolic Panel: Recent Labs  Lab 09/24/17 1216 09/25/17 0613 09/26/17 0616 09/27/17 0505  NA 138 140 138 139  K 3.7 4.1 4.0 3.8  CL 101 101 101 103  CO2 29 32 29 30  GLUCOSE 130* 108* 96 147*  BUN 11 10 11 12   CREATININE 1.18* 1.22* 1.21* 1.34*  CALCIUM 9.7 9.5 9.3 9.2   GFR: Estimated Creatinine Clearance: 31.6 mL/min (A) (by C-G formula based on SCr of 1.34 mg/dL (H)). Liver Function Tests: Recent Labs  Lab 09/24/17 1216 09/25/17 0613 09/26/17 0616 09/27/17 0505  AST 34 39 50* 33  ALT 21 21 29 24   ALKPHOS 95 83 86 77  BILITOT 1.2 1.8* 2.1*  2.0* 1.4*  PROT 7.4 6.7 6.3* 6.2*  ALBUMIN 3.8 3.4* 3.4* 3.2*   Recent Labs  Lab 09/24/17 1216 09/25/17 0613 09/27/17 0505  LIPASE 276* 70* 29   No results for input(s): AMMONIA in the last 168 hours. Coagulation Profile: No results for input(s): INR, PROTIME in the last 168 hours. Cardiac Enzymes: Recent Labs  Lab 09/24/17 1216  TROPONINI <0.03   BNP (last 3 results) No results for input(s): PROBNP in the last 8760 hours. HbA1C: No results for input(s): HGBA1C in the last 72 hours. CBG: Recent Labs  Lab 09/27/17 1145 09/27/17 1658 09/27/17 2102 09/28/17 0725 09/28/17 1105  GLUCAP 156* 102* 153* 120* 128*   Lipid Profile: No results for input(s): CHOL, HDL, LDLCALC, TRIG, CHOLHDL, LDLDIRECT in the last 72 hours. Thyroid Function Tests: No results for input(s): TSH, T4TOTAL, FREET4, T3FREE, THYROIDAB in the last 72 hours. Anemia Panel: No results for input(s): VITAMINB12, FOLATE, FERRITIN, TIBC, IRON, RETICCTPCT in the last 72 hours. Sepsis Labs: Recent Labs  Lab 09/24/17 1240 09/24/17 1415  LATICACIDVEN 1.51 1.3    Recent Results (from the past 240 hour(s))  Urine culture     Status: Abnormal   Collection Time: 09/24/17  1:43 PM  Result Value Ref Range Status   Specimen Description   Final    URINE, RANDOM Performed  at [facility], [address] [place], [place] [address]    Special Requests   Final    NONE Performed at [facility], [address], [place], [place] [address]    Culture (A)  Final    30,000 COLONIES/mL GROUP B STREP(S.AGALACTIAE)ISOLATED TESTING AGAINST S. AGALACTIAE NOT ROUTINELY PERFORMED DUE TO PREDICTABILITY OF AMP/PEN/VAN SUSCEPTIBILITY. Performed at [facility], [place] [address] [place], [place] [address]    Report Status 09/25/2017 FINAL  Final      Radiology Studies: No results found.   Scheduled Meds: . gabapentin  300 mg Oral QHS  . insulin aspart  4-10 Units Subcutaneous TID AC  . insulin glargine  5 Units Subcutaneous QHS  . pantoprazole  40 mg Oral BID AC  . senna-docusate  1 tablet Oral BID   Continuous Infusions: .  lactated ringers 75 mL/hr at 09/28/17 1205     LOS: 3 days    Time spent: 30 minutes    Qaadir Kent Darleen Crocker, DO [facility] Pager [phone]  If 7PM-7AM, please contact night-coverage [email or web address] Password [facility] 09/28/2017, 12:29 PM

## 2017-09-28 NOTE — Plan of Care (Signed)
progressing 

## 2017-09-29 ENCOUNTER — Inpatient Hospital Stay (HOSPITAL_COMMUNITY): Payer: Medicare HMO

## 2017-09-29 LAB — COMPREHENSIVE METABOLIC PANEL
ALT: 17 U/L (ref 14–54)
AST: 25 U/L (ref 15–41)
Albumin: 3.2 g/dL — ABNORMAL LOW (ref 3.5–5.0)
Alkaline Phosphatase: 76 U/L (ref 38–126)
Anion gap: 6 (ref 5–15)
BUN: 7 mg/dL (ref 6–20)
CHLORIDE: 103 mmol/L (ref 101–111)
CO2: 30 mmol/L (ref 22–32)
Calcium: 9.1 mg/dL (ref 8.9–10.3)
Creatinine, Ser: 1.25 mg/dL — ABNORMAL HIGH (ref 0.44–1.00)
GFR calc Af Amer: 44 mL/min — ABNORMAL LOW (ref >60)
GFR calc non Af Amer: 38 mL/min — ABNORMAL LOW (ref >60)
GLUCOSE: 103 mg/dL — AB (ref 65–99)
POTASSIUM: 3.6 mmol/L (ref 3.5–5.1)
Sodium: 139 mmol/L (ref 135–145)
Total Bilirubin: 1.3 mg/dL — ABNORMAL HIGH (ref 0.3–1.2)
Total Protein: 6.1 g/dL — ABNORMAL LOW (ref 6.5–8.1)

## 2017-09-29 LAB — GLUCOSE, CAPILLARY
GLUCOSE-CAPILLARY: 110 mg/dL — AB (ref 65–99)
GLUCOSE-CAPILLARY: 137 mg/dL — AB (ref 65–99)
GLUCOSE-CAPILLARY: 102 mg/dL — AB (ref 65–99)
Glucose-Capillary: 145 mg/dL — ABNORMAL HIGH (ref 65–99)
Glucose-Capillary: 113 mg/dL — ABNORMAL HIGH (ref 65–99)

## 2017-09-29 LAB — CBC
HEMATOCRIT: 34.4 % — AB (ref 36.0–46.0)
Hemoglobin: 11.4 g/dL — ABNORMAL LOW (ref 12.0–15.0)
MCH: 29.6 pg (ref 26.0–34.0)
MCHC: 33.1 g/dL (ref 30.0–36.0)
MCV: 89.4 fL (ref 78.0–100.0)
PLATELETS: 178 10*3/uL (ref 150–400)
RBC: 3.85 MIL/uL — AB (ref 3.87–5.11)
RDW: 12.6 % (ref 11.5–15.5)
WBC: 5 10*3/uL (ref 4.0–10.5)

## 2017-09-29 MED ORDER — GADOBENATE DIMEGLUMINE 529 MG/ML IV SOLN
15.0000 mL | Freq: Once | INTRAVENOUS | Status: AC | PRN
Start: 2017-09-29 — End: 2017-09-29
  Administered 2017-09-29: 15 mL via INTRAVENOUS

## 2017-09-29 MED ORDER — LACTATED RINGERS IV SOLN
INTRAVENOUS | Status: DC
  Administered 2017-09-29 – 2017-10-01 (×5): via INTRAVENOUS

## 2017-09-29 MED ORDER — HYDRALAZINE HCL 20 MG/ML IJ SOLN
10.0000 mg | Freq: Three times a day (TID) | INTRAMUSCULAR | Status: DC
  Administered 2017-09-29 – 2017-10-01 (×7): 10 mg via INTRAVENOUS
  Filled 2017-09-29 (×7): qty 1

## 2017-09-29 NOTE — Progress Notes (Addendum)
   Subjective:  Patient continues to complain of epigastric and periumbilical pain.  She says pain gets worse with meals.  She continues to have nausea and vomiting postprandially.  She vomited this morning after taking a few bites of breakfast.  No hematemesis or melena reported.  She also complains of headache.  Objective: Blood pressure (!) 161/77, pulse (!) 52, temperature 98.2 F (36.8 C), temperature source Oral, resp. rate 18, height 5\' 6"  (1.676 m), weight 157 lb 1 oz (71.2 kg), SpO2 98 %. Patient is alert and responds appropriately to questions. Abdomen is symmetrical.  Bowel sounds are present.  Abdomen is soft with mild periumbilical and epigastric tenderness.  Tenderness also noted below the right costal margin.  No organomegaly or masses.  Labs/studies Results:  Recent Labs    24-Oct-2017 0505 09/29/17 0508  WBC 4.7 5.0  HGB 11.4* 11.4*  HCT 34.4* 34.4*  PLT 184 178    BMET  Recent Labs    10-24-2017 0505 09/29/17 0508  NA 139 139  K 3.8 3.6  CL 103 103  CO2 30 30  GLUCOSE 147* 103*  BUN 12 7  CREATININE 1.34* 1.25*  CALCIUM 9.2 9.1    LFT  Recent Labs    10/24/2017 0505 09/29/17 0508  PROT 6.2* 6.1*  ALBUMIN 3.2* 3.2*  AST 33 25  ALT 24 17  ALKPHOS 77 76  BILITOT 1.4* 1.3*      Assessment:  #1.  Epigastric and periumbilical pain associated nausea and vomiting.  On presentation she had fivefold elevation of lipase which has normalized.  She also had slight bump in AST with normal ALT.  She has indirect hyperbilirubinemia which is felt to be due to Gilbert's syndrom is.  No evidence of choledocholithiasis or pancreatitis on CT and ultrasound.  She had EGD last week and it was negative for peptic ulcer disease or pyloric stenosis.  She had another CT  about 2 months ago at [facility] and it was unremarkable.  She may have common duct stone.  #2.   Headache.  Head CT 2 months ago was negative for acute abnormalities and she had neck CT on this admission.  I  do not believe headache and GI symptoms are related.  Recommendations:  MRCP today.

## 2017-09-29 NOTE — Progress Notes (Signed)
GI attending note:  MRCP reviewed with Dr. Rolm Baptise. No evidence of choledocholithiasis or pancreatic ductal abnormalities. Low takeoff of cystic duct remnant without filling defects.  Therefore source of patient's symptom complex remains unclear. Will initiate oral feeding. If she starts vomiting will consider repeating CT with oral contrast of placing an NG tube and do a contrast study to look at her small bowel.

## 2017-09-29 NOTE — Progress Notes (Signed)
PROGRESS NOTE    Traci Clark  UKG:[id number] DOB: 1931/04/19 DOA: 09/24/2017 PCP: Rory Percy, MD   Brief Narrative:   Traci Clark is a 84 y.o. female with medical history significant for type 2 diabetes, hypertension, gout, anxiety, GERD, restless leg syndrome, colon cancer, and dysphagia with prior esophageal dilation who presents to the emergency department at the urging of her gastroenterologist after being seen in the office.  She was noted to have epigastric abdominal pain along with some nausea and vomiting and serum lipase elevation.  She has been admitted with possible mild pancreatitis versus peptic ulcer disease.  She has now undergone EGD with middle esophageal dilation and is thought to have Gilbert's syndrome vs sphincter of Oddi dysfunction.  She unfortunately continues to have some more nausea and vomiting along with abdominal pain.   Assessment & Plan:   Principal Problem:   Nausea and vomiting in adult patient Active Problems:   GERD (gastroesophageal reflux disease)   RLS (restless legs syndrome)   Dysphagia, pharyngoesophageal phase   History of colon cancer   Diabetes ([place])   Essential hypertension   Gout   Anxiety   Pancreatitis   1. Intractable nausea and vomiting with epigastric abdominal pain.  Continue IV fluid for now until oral intake stabilized.  Appreciate ongoing GI evaluation with MRCP today and no acute findings.  Consideration for CT abdomen with contrast for further evaluation.  If no significant findings at this juncture, she may be considered for general surgery evaluation. 2. Dysphagia in the setting of prior esophageal dilation.  Per GI.    Re-advance diet per GI recommendations. 3. Constipation-resolved. Bisacodyl suppository. 4. GERD.  Continue PPI. 5. Diabetes.  Continue home insulin with sliding scale at half dose of Lantus until diet is advanced.  Blood glucose readings stable. 6. Hypertension.  Hold HCTZ with hydralazine pushes as  needed. BP is currently elevated.  I will schedule pushes every 8 hours. 7. Gout.  Colchicine as needed. 8. Anxiety.  Xanax as needed. 9. RLS.  Gabapentin.   DVT prophylaxis:Lovenox Code Status: Full Family Communication: Daughter and grandson at bedside Disposition Plan: Pending further evaluation per GI   Consultants:   GI, Dr. Laural Golden  Procedures:   MRCP 5/13  Antimicrobials:   None   Subjective: Patient seen and evaluated today with ongoing symptoms of nausea and abdominal pain.  She cannot tolerate her diet and is actively vomiting on my evaluation this morning.  Objective: Vitals:   09/28/17 1455 09/28/17 2041 09/29/17 0442 09/29/17 1456  BP: (!) 165/86 (!) 182/82 (!) 161/77 (!) 185/97  Pulse: (!) 54 (!) 50 (!) 52 (!) 57  Resp: 18 18 18 20   Temp: 98.3 F (36.8 C) 98 F (36.7 C) 98.2 F (36.8 C) 98.7 F (37.1 C)  TempSrc: Oral Oral Oral Oral  SpO2: 97% 98% 98% 97%  Weight:      Height:        Intake/Output Summary (Last 24 hours) at 09/29/2017 1507 Last data filed at 09/29/2017 0900 Gross per 24 hour  Intake 240 ml  Output 800 ml  Net -560 ml   Filed Weights   09/24/17 1150 09/24/17 1553  Weight: 72.1 kg (159 lb) 71.2 kg (157 lb 1 oz)    Examination:  General exam: Appears calm and comfortable  Respiratory system: Clear to auscultation. Respiratory effort normal. Cardiovascular system: S1 & S2 heard, RRR. No JVD, murmurs, rubs, gallops or clicks. No pedal edema. Gastrointestinal system: Abdomen is nondistended,  soft and nontender. No organomegaly or masses felt. Normal bowel sounds heard. Central nervous system: Alert and oriented. No focal neurological deficits. Extremities: Symmetric 5 x 5 power. Skin: No rashes, lesions or ulcers Psychiatry: Judgement and insight appear normal. Mood & affect appropriate.     Data Reviewed: I have personally reviewed following labs and imaging studies  CBC: Recent Labs  Lab 09/24/17 1216 09/25/17 0613  09/26/17 0616 09/27/17 0505 09/29/17 0508  WBC 4.7 4.9 4.4 4.7 5.0  NEUTROABS 1.9  --   --   --   --   HGB 12.7 12.2 11.9* 11.4* 11.4*  HCT 38.4 36.8 36.1 34.4* 34.4*  MCV 89.3 90.4 90.3 89.8 89.4  PLT 197 188 164 184 569   Basic Metabolic Panel: Recent Labs  Lab 09/24/17 1216 09/25/17 0613 09/26/17 0616 09/27/17 0505 09/29/17 0508  NA 138 140 138 139 139  K 3.7 4.1 4.0 3.8 3.6  CL 101 101 101 103 103  CO2 29 32 29 30 30   GLUCOSE 130* 108* 96 147* 103*  BUN 11 10 11 12 7   CREATININE 1.18* 1.22* 1.21* 1.34* 1.25*  CALCIUM 9.7 9.5 9.3 9.2 9.1   GFR: Estimated Creatinine Clearance: 33.9 mL/min (A) (by C-G formula based on SCr of 1.25 mg/dL (H)). Liver Function Tests: Recent Labs  Lab 09/24/17 1216 09/25/17 0613 09/26/17 0616 09/27/17 0505 09/29/17 0508  AST 34 39 50* 33 25  ALT 21 21 29 24 17   ALKPHOS 95 83 86 77 76  BILITOT 1.2 1.8* 2.1*  2.0* 1.4* 1.3*  PROT 7.4 6.7 6.3* 6.2* 6.1*  ALBUMIN 3.8 3.4* 3.4* 3.2* 3.2*   Recent Labs  Lab 09/24/17 1216 09/25/17 0613 09/27/17 0505  LIPASE 276* 70* 29   No results for input(s): AMMONIA in the last 168 hours. Coagulation Profile: No results for input(s): INR, PROTIME in the last 168 hours. Cardiac Enzymes: Recent Labs  Lab 09/24/17 1216  TROPONINI <0.03   BNP (last 3 results) No results for input(s): PROBNP in the last 8760 hours. HbA1C: No results for input(s): HGBA1C in the last 72 hours. CBG: Recent Labs  Lab 09/28/17 1105 09/28/17 1607 09/28/17 2043 09/29/17 0727 09/29/17 1159  GLUCAP 128* 100* 137* 137* 102*   Lipid Profile: No results for input(s): CHOL, HDL, LDLCALC, TRIG, CHOLHDL, LDLDIRECT in the last 72 hours. Thyroid Function Tests: No results for input(s): TSH, T4TOTAL, FREET4, T3FREE, THYROIDAB in the last 72 hours. Anemia Panel: No results for input(s): VITAMINB12, FOLATE, FERRITIN, TIBC, IRON, RETICCTPCT in the last 72 hours. Sepsis Labs: Recent Labs  Lab 09/24/17 1240  09/24/17 1415  LATICACIDVEN 1.51 1.3    Recent Results (from the past 240 hour(s))  Urine culture     Status: Abnormal   Collection Time: 09/24/17  1:43 PM  Result Value Ref Range Status   Specimen Description   Final    URINE, RANDOM Performed at [facility], [address] [place], [place] [address]    Special Requests   Final    NONE Performed at [facility], [address], [place], [place] [address]    Culture (A)  Final    30,000 COLONIES/mL GROUP B STREP(S.AGALACTIAE)ISOLATED TESTING AGAINST S. AGALACTIAE NOT ROUTINELY PERFORMED DUE TO PREDICTABILITY OF AMP/PEN/VAN SUSCEPTIBILITY. Performed at [facility], [place] [address] [place], [place] [address]    Report Status 09/25/2017 FINAL  Final      Radiology Studies: Mr 3d Recon At Scanner  Result Date: 09/29/2017 CLINICAL DATA:  Pancreatitis.  Biliary dilatation. Small hyperdense renal lesions. EXAM: MRI ABDOMEN WITHOUT AND WITH CONTRAST (INCLUDING MRCP) TECHNIQUE: Multiplanar multisequence MR imaging of the abdomen was performed both before and after the administration of intravenous contrast. Heavily T2-weighted images of the biliary and pancreatic ducts were obtained, and three-dimensional MRCP images were rendered by post processing. CONTRAST:  40mL MULTIHANCE GADOBENATE DIMEGLUMINE 529 MG/ML IV SOLN COMPARISON:  09/24/2017 FINDINGS: Despite efforts by the technologist and patient, motion artifact is present on today's exam and could not be eliminated. This is a common result when MRCP is attempted in the inpatient setting where patients are less likely to be able to breath hold and cooperate in controlling motion. This reduces exam sensitivity and specificity. Lower chest: Mild cardiomegaly. Subsegmental atelectasis in both lower lobes. Hepatobiliary: Diffuse mild intrahepatic biliary dilatation without overt beading or irregularity. Common hepatic duct measures 0.9 cm in diameter, common bile duct 0.8 cm. Subject to the  limitations of the motion artifact, I do not see a filling defect. The cystic duct remnant is slightly distended. There is a small cyst posteriorly in the right hepatic lobe on image 37/5007, and a similar lesion in the dome of the right hepatic lobe on image 21. Pancreas: Borderline dilated dorsal pancreatic duct. Otherwise unremarkable. Spleen:  Unremarkable Adrenals/Urinary Tract:  Adrenal glands normal. Bilateral Bosniak category 2 and category 1 cysts are present. There are fluid-levels in the Bosniak category 2 cysts of the right kidney upper pole. On subtraction images I do not see a significant degree of enhancement to suggest malignancy involving these renal lesions. Stomach/Bowel: Unremarkable Vascular/Lymphatic: Aortoiliac atherosclerotic vascular disease. No pathologic adenopathy. Other:  No supplemental non-categorized findings. Musculoskeletal: Lumbar spondylosis and degenerative disc disease. Degenerative hip arthropathy bilaterally, especially severe on the left. IMPRESSION: 1. Mild intrahepatic biliary dilatation with the common bile duct essentially within normal limits, and slightly dilated cystic duct remnant. Much of this is likely a physiologic response to cholecystectomy. The dorsal pancreatic duct is borderline dilated. No discrete pancreatic mass is identified. 2. Bilateral complex Bosniak category 2 renal cysts appear benign. 3.  Aortic Atherosclerosis (ICD10-I70.0). 4. Lumbar spondylosis and degenerative disc disease. 5. Degenerative left hip arthropathy. 6. Mild cardiomegaly. Electronically Signed   By: Van Clines M.D.   On: 09/29/2017 13:55   Mr Abdomen Mrcp Moise Boring Contast  Result Date: 09/29/2017 CLINICAL DATA:  Pancreatitis. Biliary dilatation. Small hyperdense renal lesions. EXAM: MRI ABDOMEN WITHOUT AND WITH CONTRAST (INCLUDING MRCP) TECHNIQUE: Multiplanar multisequence MR imaging of the abdomen was performed both before and after the administration of intravenous contrast.  Heavily T2-weighted images of the biliary and pancreatic ducts were obtained, and three-dimensional MRCP images were rendered by post processing. CONTRAST:  53mL MULTIHANCE GADOBENATE DIMEGLUMINE 529 MG/ML IV SOLN COMPARISON:  09/24/2017 FINDINGS: Despite efforts by the technologist and patient, motion artifact is present on today's exam and could not be eliminated. This is a common result when MRCP is attempted in the inpatient setting where patients are less likely to be able to breath hold and cooperate in controlling motion. This reduces exam sensitivity and specificity. Lower chest: Mild cardiomegaly. Subsegmental atelectasis in both lower lobes. Hepatobiliary: Diffuse mild intrahepatic biliary dilatation without overt beading or irregularity. Common hepatic duct measures 0.9 cm in diameter, common bile duct 0.8 cm. Subject to the limitations of the motion artifact, I do not see a filling defect. The cystic duct remnant is slightly distended. There is a small cyst posteriorly in the right hepatic lobe on image 37/5007, and a  similar lesion in the dome of the right hepatic lobe on image 21. Pancreas: Borderline dilated dorsal pancreatic duct. Otherwise unremarkable. Spleen:  Unremarkable Adrenals/Urinary Tract:  Adrenal glands normal. Bilateral Bosniak category 2 and category 1 cysts are present. There are fluid-levels in the Bosniak category 2 cysts of the right kidney upper pole. On subtraction images I do not see a significant degree of enhancement to suggest malignancy involving these renal lesions. Stomach/Bowel: Unremarkable Vascular/Lymphatic: Aortoiliac atherosclerotic vascular disease. No pathologic adenopathy. Other:  No supplemental non-categorized findings. Musculoskeletal: Lumbar spondylosis and degenerative disc disease. Degenerative hip arthropathy bilaterally, especially severe on the left. IMPRESSION: 1. Mild intrahepatic biliary dilatation with the common bile duct essentially within normal  limits, and slightly dilated cystic duct remnant. Much of this is likely a physiologic response to cholecystectomy. The dorsal pancreatic duct is borderline dilated. No discrete pancreatic mass is identified. 2. Bilateral complex Bosniak category 2 renal cysts appear benign. 3.  Aortic Atherosclerosis (ICD10-I70.0). 4. Lumbar spondylosis and degenerative disc disease. 5. Degenerative left hip arthropathy. 6. Mild cardiomegaly. Electronically Signed   By: Van Clines M.D.   On: 09/29/2017 13:55     Scheduled Meds: . gabapentin  300 mg Oral QHS  . insulin aspart  4-10 Units Subcutaneous TID AC  . insulin glargine  5 Units Subcutaneous QHS  . pantoprazole  40 mg Oral BID AC  . senna-docusate  1 tablet Oral BID   Continuous Infusions:    LOS: 4 days    Time spent: 30 minutes    Seraj Dunnam Darleen Crocker, DO [facility] Pager [phone]  If 7PM-7AM, please contact night-coverage [email or web address] Password [facility] 09/29/2017, 3:07 PM

## 2017-09-29 NOTE — Progress Notes (Signed)
Patient consumed 1/3 of a sandwich and vomited afterwards

## 2017-09-29 NOTE — Care Management Important Message (Signed)
Important Message  Patient Details  Name: Traci Clark MRN: [id number] Date of Birth: [birth year]   Medicare Important Message Given:  Yes    Shelda Altes 09/29/2017, 1:38 PM

## 2017-09-30 ENCOUNTER — Inpatient Hospital Stay (HOSPITAL_COMMUNITY): Payer: Medicare HMO

## 2017-09-30 LAB — GLUCOSE, CAPILLARY
GLUCOSE-CAPILLARY: 199 mg/dL — AB (ref 65–99)
Glucose-Capillary: 104 mg/dL — ABNORMAL HIGH (ref 65–99)
Glucose-Capillary: 139 mg/dL — ABNORMAL HIGH (ref 65–99)
Glucose-Capillary: 206 mg/dL — ABNORMAL HIGH (ref 65–99)

## 2017-09-30 LAB — CBC
HCT: 36.9 % (ref 36.0–46.0)
HEMOGLOBIN: 12.3 g/dL (ref 12.0–15.0)
MCH: 29.6 pg (ref 26.0–34.0)
MCHC: 33.3 g/dL (ref 30.0–36.0)
MCV: 88.9 fL (ref 78.0–100.0)
Platelets: 176 10*3/uL (ref 150–400)
RBC: 4.15 MIL/uL (ref 3.87–5.11)
RDW: 12.2 % (ref 11.5–15.5)
WBC: 5.4 10*3/uL (ref 4.0–10.5)

## 2017-09-30 LAB — COMPREHENSIVE METABOLIC PANEL
ALBUMIN: 3.2 g/dL — AB (ref 3.5–5.0)
ALT: 17 U/L (ref 14–54)
ANION GAP: 6 (ref 5–15)
AST: 22 U/L (ref 15–41)
Alkaline Phosphatase: 77 U/L (ref 38–126)
BILIRUBIN TOTAL: 1.4 mg/dL — AB (ref 0.3–1.2)
BUN: 7 mg/dL (ref 6–20)
CHLORIDE: 104 mmol/L (ref 101–111)
CO2: 29 mmol/L (ref 22–32)
Calcium: 9.1 mg/dL (ref 8.9–10.3)
Creatinine, Ser: 1.11 mg/dL — ABNORMAL HIGH (ref 0.44–1.00)
GFR calc Af Amer: 51 mL/min — ABNORMAL LOW (ref >60)
GFR calc non Af Amer: 44 mL/min — ABNORMAL LOW (ref >60)
GLUCOSE: 119 mg/dL — AB (ref 65–99)
POTASSIUM: 3.6 mmol/L (ref 3.5–5.1)
SODIUM: 139 mmol/L (ref 135–145)
TOTAL PROTEIN: 6.3 g/dL — AB (ref 6.5–8.1)

## 2017-09-30 LAB — URIC ACID: Uric Acid, Serum: 7.1 mg/dL — ABNORMAL HIGH (ref 2.3–6.6)

## 2017-09-30 MED ORDER — MEPERIDINE HCL 50 MG/ML IJ SOLN
12.5000 mg | Freq: Once | INTRAMUSCULAR | Status: AC
  Administered 2017-09-30: 12.5 mg via INTRAVENOUS
  Filled 2017-09-30: qty 1

## 2017-09-30 MED ORDER — FLEET ENEMA 7-19 GM/118ML RE ENEM: 1.0000 | ENEMA | Freq: Once | RECTAL | Status: DC

## 2017-09-30 MED ORDER — LATANOPROST 0.005 % OP SOLN
1.0000 [drp] | Freq: Every day | OPHTHALMIC | Status: DC
  Administered 2017-09-30: 1 [drp] via OPHTHALMIC
  Filled 2017-09-30: qty 2.5

## 2017-09-30 MED ORDER — METHYLPREDNISOLONE SODIUM SUCC 40 MG IJ SOLR
20.0000 mg | Freq: Two times a day (BID) | INTRAMUSCULAR | Status: DC
  Administered 2017-09-30 – 2017-10-01 (×3): 20 mg via INTRAVENOUS
  Filled 2017-09-30 (×3): qty 1

## 2017-09-30 MED ORDER — ALPRAZOLAM 0.5 MG PO TABS
0.5000 mg | ORAL_TABLET | Freq: Three times a day (TID) | ORAL | Status: DC
  Administered 2017-09-30 – 2017-10-01 (×4): 0.5 mg via ORAL
  Filled 2017-09-30 (×4): qty 1

## 2017-09-30 MED ORDER — IOPAMIDOL (ISOVUE-300) INJECTION 61%
INTRAVENOUS | Status: AC
  Administered 2017-09-30: 300 mL via NASOGASTRIC
  Filled 2017-09-30: qty 450

## 2017-09-30 MED ORDER — LORAZEPAM 2 MG/ML IJ SOLN
1.0000 mg | Freq: Once | INTRAMUSCULAR | Status: AC
  Administered 2017-09-30: 1 mg via INTRAVENOUS
  Filled 2017-09-30: qty 1

## 2017-09-30 NOTE — Progress Notes (Signed)
  Subjective:  Patient ate about one third of her sandwich yesterday afternoon and vomited within few minutes.  She states she has not had a bowel movement in the last 3 days.  This morning she is pain-free.  She also denies nausea or headache.  Objective: Blood pressure (!) 141/70, pulse (!) 57, temperature 98.4 F (36.9 C), temperature source Oral, resp. rate 18, height 5\' 6"  (1.676 m), weight 157 lb 1 oz (71.2 kg), SpO2 100 %. Patient is alert and in no acute distress. Abdomen is symmetrical.  She has long scar across upper abdomen.  Bowel sounds are normal.  Palpation of scar reveal no nodularity or hernia.  On palpation abdomen is soft and nontender without organomegaly or masses.  Labs/studies Results:  Recent Labs    10/19/17 0508 09/30/17 0517  WBC 5.0 5.4  HGB 11.4* 12.3  HCT 34.4* 36.9  PLT 178 176    BMET  Recent Labs    October 19, 2017 0508 09/30/17 0517  NA 139 139  K 3.6 3.6  CL 103 104  CO2 30 29  GLUCOSE 103* 119*  BUN 7 7  CREATININE 1.25* 1.11*  CALCIUM 9.1 9.1    LFT  Recent Labs    19-Oct-2017 0508 09/30/17 0517  PROT 6.1* 6.3*  ALBUMIN 3.2* 3.2*  AST 25 22  ALT 17 17  ALKPHOS 76 77  BILITOT 1.3* 1.4*      Assessment:  #1. Nausea vomiting and abdominal pain.  Patient has undergone Patient has undergone extensive work-up and no etiology determined to account for her symptoms.  She had elevated serum lipase on admission but CT and MRCP negative for changes of pancreatitis.  Similarly MRCP negative for choledocholithiasis.  No other abnormality noted involving bowel on CT.  Her symptoms are not consistent with diabetic gastroparesis.  She could have stricture involving proximal small bowel or spigelian hernia.  Recommendations:  Proceed with small bowel study after placing an NG tube.  I am afraid she will start throwing up if she was to drink contrast for study. Can resume pharmaceutical VTE prophylaxis. Fleet Enema x1.

## 2017-09-30 NOTE — Progress Notes (Addendum)
PROGRESS NOTE    Traci Clark  PPJ:[id number] DOB: 1931-01-11 DOA: 09/24/2017 PCP: Rory Percy, MD   Brief Narrative:   Traci Clark is a 84 y.o. female with medical history significant for type 2 diabetes, hypertension, gout, anxiety, GERD, restless leg syndrome, colon cancer, and dysphagia with prior esophageal dilation who presents to the emergency department at the urging of her gastroenterologist after being seen in the office.  She was noted to have epigastric abdominal pain along with some nausea and vomiting and serum lipase elevation.  She has been admitted with possible mild pancreatitis versus peptic ulcer disease.  She has now undergone EGD with middle esophageal dilation as well as MRCP and subsequent CT abdomen with contrast today.  She has no significant findings on imaging studies to validate her clinical symptoms.  GI is recommending observation and diet advancement through today with concerns for possible psychosomatic cause behind her persistent symptoms.   Assessment & Plan:   Principal Problem:   Nausea and vomiting in adult patient Active Problems:   GERD (gastroesophageal reflux disease)   RLS (restless legs syndrome)   Dysphagia, pharyngoesophageal phase   History of colon cancer   Diabetes ([place])   Essential hypertension   Gout   Anxiety   Pancreatitis   1. Intractable nausea and vomiting.  Continue current diet and monitor for further symptomatology.  GI evaluation completed with exhaustive studies that did not demonstrate any significant findings.  There is concern for possible psychosomatic component and there has been previous discussion about possible need for ex lap with general surgery should patient continue to have symptoms with no obvious cause. 2. Dysphagia in the setting of prior esophageal dilation.  This appears to be resolved. 3. Tremors/Rigors.  Will administer meperidine IV.  This may be related to benzodiazepine withdrawal as patient claims  that she does take Xanax 0.5 mg 3 times daily at home, but has only been receiving it as needed at bedtime here.  Will resume prior dose of Xanax and if meperidine does not seem to help, will consider diazepam. 4. Acute gout of R first toe.  Started on IV methylprednisolone 20 mg twice daily today.  Uric acid pending. 5. Constipation- intermittent.  Enema per GI today. 6. GERD.  Continue PPI. 7. Diabetes.  Continue home insulin with sliding scale at half dose of Lantus until diet is advanced.  Blood glucose readings stable. 8. Hypertension.    Continue hydralazine pushes every 8 hours.  May resume home hydrochlorothiazide on discharge.  This is currently being held due to her poor oral intake with ongoing nausea and vomiting. 9. Gout.  Colchicine as needed. 10. Anxiety.  Xanax as needed. 11. RLS.  Gabapentin.   DVT prophylaxis:Lovenox Code Status: Full Family Communication: Daughter and grandson at bedside Disposition Plan: Attempted diet today and if clinically improved, may consider for discharge in next 1 to 2 days.  No further current recommendations per GI.   Consultants:   GI, Dr. Laural Golden  Procedures:   MRCP 5/13  EGD with dilation 5/10  Antimicrobials:   None   Subjective: Patient seen and evaluated today and had NG tube placed earlier for contrast study of her abdomen.  No acute findings noted.  She continues to have nausea and vomiting and has developed some rigors and tremors after her CT study.  She claims to be taking Xanax 0.5 mg 3 times a day at home on a regular basis and has not been receiving that here.  Objective: Vitals:   09/29/17 2107 09/29/17 2246 09/30/17 0305 09/30/17 1150  BP: (!) 175/84 (!) 143/82 (!) 141/70 (!) 159/87  Pulse: 70 (!) 59 (!) 57 87  Resp: 18   18  Temp: 98.8 F (37.1 C) 99.1 F (37.3 C) 98.4 F (36.9 C) 98.3 F (36.8 C)  TempSrc: Oral Oral Oral Oral  SpO2: 95% 100% 100% 96%  Weight:      Height:        Intake/Output Summary  (Last 24 hours) at 09/30/2017 1243 Last data filed at 09/30/2017 0800 Gross per 24 hour  Intake 1280 ml  Output 550 ml  Net 730 ml   Filed Weights   09/24/17 1150 09/24/17 1553  Weight: 72.1 kg (159 lb) 71.2 kg (157 lb 1 oz)    Examination:  General exam: Appears calm and comfortable, tremors noted Respiratory system: Clear to auscultation. Respiratory effort normal. Cardiovascular system: S1 & S2 heard, RRR. No JVD, murmurs, rubs, gallops or clicks. No pedal edema. Gastrointestinal system: Abdomen is nondistended, soft and nontender. No organomegaly or masses felt. Normal bowel sounds heard. Central nervous system: Alert and oriented. No focal neurological deficits. Extremities: Symmetric 5 x 5 power. Skin: No rashes, lesions or ulcers    Data Reviewed: I have personally reviewed following labs and imaging studies  CBC: Recent Labs  Lab 09/24/17 1216 09/25/17 0613 09/26/17 0616 09/27/17 0505 09/29/17 0508 09/30/17 0517  WBC 4.7 4.9 4.4 4.7 5.0 5.4  NEUTROABS 1.9  --   --   --   --   --   HGB 12.7 12.2 11.9* 11.4* 11.4* 12.3  HCT 38.4 36.8 36.1 34.4* 34.4* 36.9  MCV 89.3 90.4 90.3 89.8 89.4 88.9  PLT 197 188 164 184 178 287   Basic Metabolic Panel: Recent Labs  Lab 09/25/17 0613 09/26/17 0616 09/27/17 0505 09/29/17 0508 09/30/17 0517  NA 140 138 139 139 139  K 4.1 4.0 3.8 3.6 3.6  CL 101 101 103 103 104  CO2 32 29 30 30 29   GLUCOSE 108* 96 147* 103* 119*  BUN 10 11 12 7 7   CREATININE 1.22* 1.21* 1.34* 1.25* 1.11*  CALCIUM 9.5 9.3 9.2 9.1 9.1   GFR: Estimated Creatinine Clearance: 38.2 mL/min (A) (by C-G formula based on SCr of 1.11 mg/dL (H)). Liver Function Tests: Recent Labs  Lab 09/25/17 8676 09/26/17 0616 09/27/17 0505 09/29/17 0508 09/30/17 0517  AST 39 50* 33 25 22  ALT 21 29 24 17 17   ALKPHOS 83 86 77 76 77  BILITOT 1.8* 2.1*  2.0* 1.4* 1.3* 1.4*  PROT 6.7 6.3* 6.2* 6.1* 6.3*  ALBUMIN 3.4* 3.4* 3.2* 3.2* 3.2*   Recent Labs  Lab  09/24/17 1216 09/25/17 0613 09/27/17 0505  LIPASE 276* 70* 29   No results for input(s): AMMONIA in the last 168 hours. Coagulation Profile: No results for input(s): INR, PROTIME in the last 168 hours. Cardiac Enzymes: Recent Labs  Lab 09/24/17 1216  TROPONINI <0.03   BNP (last 3 results) No results for input(s): PROBNP in the last 8760 hours. HbA1C: No results for input(s): HGBA1C in the last 72 hours. CBG: Recent Labs  Lab 09/29/17 1159 09/29/17 1617 09/29/17 2111 09/30/17 0723 09/30/17 1123  GLUCAP 102* 145* 113* 104* 139*   Lipid Profile: No results for input(s): CHOL, HDL, LDLCALC, TRIG, CHOLHDL, LDLDIRECT in the last 72 hours. Thyroid Function Tests: No results for input(s): TSH, T4TOTAL, FREET4, T3FREE, THYROIDAB in the last 72 hours. Anemia Panel: No results for  input(s): VITAMINB12, FOLATE, FERRITIN, TIBC, IRON, RETICCTPCT in the last 72 hours. Sepsis Labs: Recent Labs  Lab 09/24/17 1240 09/24/17 1415  LATICACIDVEN 1.51 1.3    Recent Results (from the past 240 hour(s))  Urine culture     Status: Abnormal   Collection Time: 09/24/17  1:43 PM  Result Value Ref Range Status   Specimen Description   Final    URINE, RANDOM Performed at [facility], [address] [place], [place] [address]    Special Requests   Final    NONE Performed at [facility], [address], [place], [place] [address]    Culture (A)  Final    30,000 COLONIES/mL GROUP B STREP(S.AGALACTIAE)ISOLATED TESTING AGAINST S. AGALACTIAE NOT ROUTINELY PERFORMED DUE TO PREDICTABILITY OF AMP/PEN/VAN SUSCEPTIBILITY. Performed at [facility], [place] [address], [place], [place] [address]    Report Status 09/25/2017 FINAL  Final      Radiology Studies: Dg Small Bowel  Result Date: 09/30/2017 CLINICAL DATA:  Nausea, vomiting, history of colon cancer post surgery EXAM: SMALL BOWEL SERIES COMPARISON:  CT abdomen and pelvis 09/25/2011 TECHNIQUE: Following ingestion of thin barium, serial  small bowel images were obtained including spot views of the terminal ileum. FLUOROSCOPY TIME:  Fluoroscopy Time:  0 minutes 36 seconds Radiation Exposure Index (if provided by the fluoroscopic device): 7.8 mGy Number of Acquired Spot Images: 5 plus multiple fluoroscopic screen captures FINDINGS: Normal bowel gas pattern on scout image. Tip of nasogastric tube projects over gastric antrum. Anastomotic staple line lateral RIGHT mid abdomen from prior colonic resection. Osseous demineralization with degenerative changes of the lumbar spine and LEFT hip. Numerous pelvic phleboliths. Combination of thin barium and water-soluble contrast was administered via nasogastric tube. Mildly accelerated transit of contrast through small bowel to RIGHT colon 45 minutes. No gastric outlet obstruction. Normal duodenum morphology. Jejunal and ileal small bowel loops normal appearance with normal mucosal fold pattern. No bowel wall or mucosal fold thickening. Prior ileocecectomy with patent ileocolic anastomosis and RIGHT mid abdomen. Compression views of the distal small bowel are unremarkable. Residual stool at splenic flexure. IMPRESSION: Prior RIGHT ileocolectomy. Otherwise normal exam. Electronically Signed   By: Lavonia Dana M.D.   On: 09/30/2017 11:03   Mr 3d Recon At Scanner  Result Date: 09/29/2017 CLINICAL DATA:  Pancreatitis. Biliary dilatation. Small hyperdense renal lesions. EXAM: MRI ABDOMEN WITHOUT AND WITH CONTRAST (INCLUDING MRCP) TECHNIQUE: Multiplanar multisequence MR imaging of the abdomen was performed both before and after the administration of intravenous contrast. Heavily T2-weighted images of the biliary and pancreatic ducts were obtained, and three-dimensional MRCP images were rendered by post processing. CONTRAST:  32mL MULTIHANCE GADOBENATE DIMEGLUMINE 529 MG/ML IV SOLN COMPARISON:  09/24/2017 FINDINGS: Despite efforts by the technologist and patient, motion artifact is present on today's exam and could  not be eliminated. This is a common result when MRCP is attempted in the inpatient setting where patients are less likely to be able to breath hold and cooperate in controlling motion. This reduces exam sensitivity and specificity. Lower chest: Mild cardiomegaly. Subsegmental atelectasis in both lower lobes. Hepatobiliary: Diffuse mild intrahepatic biliary dilatation without overt beading or irregularity. Common hepatic duct measures 0.9 cm in diameter, common bile duct 0.8 cm. Subject to the limitations of the motion artifact, I do not see a filling defect. The cystic duct remnant is slightly distended. There is a small cyst posteriorly in the right hepatic lobe on image 37/5007, and a similar lesion in the dome of the right  hepatic lobe on image 21. Pancreas: Borderline dilated dorsal pancreatic duct. Otherwise unremarkable. Spleen:  Unremarkable Adrenals/Urinary Tract:  Adrenal glands normal. Bilateral Bosniak category 2 and category 1 cysts are present. There are fluid-levels in the Bosniak category 2 cysts of the right kidney upper pole. On subtraction images I do not see a significant degree of enhancement to suggest malignancy involving these renal lesions. Stomach/Bowel: Unremarkable Vascular/Lymphatic: Aortoiliac atherosclerotic vascular disease. No pathologic adenopathy. Other:  No supplemental non-categorized findings. Musculoskeletal: Lumbar spondylosis and degenerative disc disease. Degenerative hip arthropathy bilaterally, especially severe on the left. IMPRESSION: 1. Mild intrahepatic biliary dilatation with the common bile duct essentially within normal limits, and slightly dilated cystic duct remnant. Much of this is likely a physiologic response to cholecystectomy. The dorsal pancreatic duct is borderline dilated. No discrete pancreatic mass is identified. 2. Bilateral complex Bosniak category 2 renal cysts appear benign. 3.  Aortic Atherosclerosis (ICD10-I70.0). 4. Lumbar spondylosis and  degenerative disc disease. 5. Degenerative left hip arthropathy. 6. Mild cardiomegaly. Electronically Signed   By: Van Clines M.D.   On: 09/29/2017 13:55   Dg Chest Port 1 View  Result Date: 09/30/2017 CLINICAL DATA:  Nasogastric tube placement EXAM: PORTABLE CHEST 1 VIEW COMPARISON:  Portable exam 0827 hours compared to 09/24/2017 FINDINGS: Nasogastric tube extends into stomach. Upper normal heart size. Mediastinal contours and pulmonary vascularity normal. LEFT basilar atelectasis. Lungs otherwise clear. No pleural effusion or pneumothorax. Bones demineralized with LEFT glenohumeral degenerative changes noted. IMPRESSION: Nasogastric tube extends into stomach. Subsegmental atelectasis LEFT base. Electronically Signed   By: Lavonia Dana M.D.   On: 09/30/2017 08:55   Mr Abdomen Mrcp W Wo Contast  Result Date: 09/29/2017 CLINICAL DATA:  Pancreatitis. Biliary dilatation. Small hyperdense renal lesions. EXAM: MRI ABDOMEN WITHOUT AND WITH CONTRAST (INCLUDING MRCP) TECHNIQUE: Multiplanar multisequence MR imaging of the abdomen was performed both before and after the administration of intravenous contrast. Heavily T2-weighted images of the biliary and pancreatic ducts were obtained, and three-dimensional MRCP images were rendered by post processing. CONTRAST:  79mL MULTIHANCE GADOBENATE DIMEGLUMINE 529 MG/ML IV SOLN COMPARISON:  09/24/2017 FINDINGS: Despite efforts by the technologist and patient, motion artifact is present on today's exam and could not be eliminated. This is a common result when MRCP is attempted in the inpatient setting where patients are less likely to be able to breath hold and cooperate in controlling motion. This reduces exam sensitivity and specificity. Lower chest: Mild cardiomegaly. Subsegmental atelectasis in both lower lobes. Hepatobiliary: Diffuse mild intrahepatic biliary dilatation without overt beading or irregularity. Common hepatic duct measures 0.9 cm in diameter, common  bile duct 0.8 cm. Subject to the limitations of the motion artifact, I do not see a filling defect. The cystic duct remnant is slightly distended. There is a small cyst posteriorly in the right hepatic lobe on image 37/5007, and a similar lesion in the dome of the right hepatic lobe on image 21. Pancreas: Borderline dilated dorsal pancreatic duct. Otherwise unremarkable. Spleen:  Unremarkable Adrenals/Urinary Tract:  Adrenal glands normal. Bilateral Bosniak category 2 and category 1 cysts are present. There are fluid-levels in the Bosniak category 2 cysts of the right kidney upper pole. On subtraction images I do not see a significant degree of enhancement to suggest malignancy involving these renal lesions. Stomach/Bowel: Unremarkable Vascular/Lymphatic: Aortoiliac atherosclerotic vascular disease. No pathologic adenopathy. Other:  No supplemental non-categorized findings. Musculoskeletal: Lumbar spondylosis and degenerative disc disease. Degenerative hip arthropathy bilaterally, especially severe on the left. IMPRESSION: 1. Mild intrahepatic biliary dilatation  with the common bile duct essentially within normal limits, and slightly dilated cystic duct remnant. Much of this is likely a physiologic response to cholecystectomy. The dorsal pancreatic duct is borderline dilated. No discrete pancreatic mass is identified. 2. Bilateral complex Bosniak category 2 renal cysts appear benign. 3.  Aortic Atherosclerosis (ICD10-I70.0). 4. Lumbar spondylosis and degenerative disc disease. 5. Degenerative left hip arthropathy. 6. Mild cardiomegaly. Electronically Signed   By: Van Clines M.D.   On: 09/29/2017 13:55     Scheduled Meds: . ALPRAZolam  0.5 mg Oral TID  . gabapentin  300 mg Oral QHS  . hydrALAZINE  10 mg Intravenous Q8H  . insulin aspart  4-10 Units Subcutaneous TID AC  . insulin glargine  5 Units Subcutaneous QHS  . meperidine (DEMEROL) injection  12.5 mg Intravenous Once  . methylPREDNISolone  (SOLU-MEDROL) injection  20 mg Intravenous Q12H  . pantoprazole  40 mg Oral BID AC  . senna-docusate  1 tablet Oral BID  . sodium phosphate  1 enema Rectal Once   Continuous Infusions: . lactated ringers 75 mL/hr at 09/30/17 1127     LOS: 5 days    Time spent: 30 minutes    Capucine Tryon Darleen Crocker, DO [facility] Pager [phone]  If 7PM-7AM, please contact night-coverage [email or web address] Password [facility] 09/30/2017, 12:43 PM

## 2017-09-30 NOTE — Progress Notes (Signed)
Small bowel study reviewed. No abnormality noted small bowel.  Patent ileocolonic anastomosis and right mid abdomen and contrast in the colon. Results reviewed with patient her grandson and headache as well as to the family members. She now has coarse tremors to lower extremities which Dr. Manuella Ghazi is addressed.  There is tremor started after she returned from radiology department.  Far we have not found any structural abnormality to account for her symptoms. If abdominal pain and vomiting persists she will have to be transferred to a tertiary center.

## 2017-09-30 NOTE — Care Management Note (Signed)
Case Management Note  Patient Details  Name: Traci Clark MRN: [id number] Date of Birth: 1930/12/21  If discussed at Long Length of Stay Meetings, dates discussed:  09/30/2017  Additional Comments:  Sherald Barge, RN 09/30/2017, 12:14 PM

## 2017-10-01 ENCOUNTER — Encounter (HOSPITAL_COMMUNITY): Payer: Self-pay | Admitting: Internal Medicine

## 2017-10-01 DIAGNOSIS — M1A9XX0 Chronic gout, unspecified, without tophus (tophi): Secondary | ICD-10-CM

## 2017-10-01 DIAGNOSIS — N182 Chronic kidney disease, stage 2 (mild): Secondary | ICD-10-CM

## 2017-10-01 DIAGNOSIS — K859 Acute pancreatitis without necrosis or infection, unspecified: Principal | ICD-10-CM

## 2017-10-01 DIAGNOSIS — I10 Essential (primary) hypertension: Secondary | ICD-10-CM

## 2017-10-01 DIAGNOSIS — E1121 Type 2 diabetes mellitus with diabetic nephropathy: Secondary | ICD-10-CM

## 2017-10-01 DIAGNOSIS — N179 Acute kidney failure, unspecified: Secondary | ICD-10-CM

## 2017-10-01 DIAGNOSIS — F419 Anxiety disorder, unspecified: Secondary | ICD-10-CM

## 2017-10-01 DIAGNOSIS — R1084 Generalized abdominal pain: Secondary | ICD-10-CM

## 2017-10-01 DIAGNOSIS — K219 Gastro-esophageal reflux disease without esophagitis: Secondary | ICD-10-CM

## 2017-10-01 LAB — GLUCOSE, CAPILLARY
GLUCOSE-CAPILLARY: 180 mg/dL — AB (ref 65–99)
Glucose-Capillary: 157 mg/dL — ABNORMAL HIGH (ref 65–99)
Glucose-Capillary: 221 mg/dL — ABNORMAL HIGH (ref 65–99)

## 2017-10-01 MED ORDER — PREDNISONE 20 MG PO TABS: 20.0000 mg | ORAL_TABLET | Freq: Every day | ORAL | 0 refills | Status: AC

## 2017-10-01 MED ORDER — ALPRAZOLAM 0.5 MG PO TABS: 0.5000 mg | ORAL_TABLET | Freq: Three times a day (TID) | ORAL | Status: DC | PRN

## 2017-10-01 MED ORDER — PANTOPRAZOLE SODIUM 40 MG PO TBEC: 40.0000 mg | DELAYED_RELEASE_TABLET | Freq: Every day | ORAL | Status: DC

## 2017-10-01 MED ORDER — PANTOPRAZOLE SODIUM 40 MG PO TBEC: 40.0000 mg | DELAYED_RELEASE_TABLET | Freq: Two times a day (BID) | ORAL | 1 refills | Status: DC

## 2017-10-01 NOTE — Evaluation (Signed)
Physical Therapy Evaluation Patient Details Name: URI COVEY MRN: [id number] DOB: [birth year] Today's Date: 10/01/2017   History of Present Illness  MARCILLE BARMAN is a 84 y.o. female with medical history significant for type 2 diabetes, hypertension, gout, anxiety, GERD, restless leg syndrome, colon cancer, and dysphagia with prior esophageal dilation who presents to the emergency department at the urging of her gastroenterologist after being seen in the office.  She was noted to have epigastric abdominal pain along with some nausea and vomiting and serum lipase elevation.  She has been admitted with possible mild pancreatitis versus peptic ulcer disease.  She was seen by GI while inpatient and had undergone EGD with esophageal dilation on 5/10.  She was noted to have some mild stenosis in her mid esophagus.  She does continue to have some mild LFTs and is thought to have possible Gilbert syndrome versus sphincter of Oddi dysfunction versus microlithiasis  Clinical Impression  Ms. Montalto states that she falls frequently at home.  She is going to outpatient therapy in [place] for this.  At this time pt was able to get out of her bed I, don her own socks, get on and off the commode mod  I. And ambulate with a rolling walker with mod I .  Ms Nicole does not need skilled PT in this setting but should continue her outpatient therapy once discharged.     Follow Up Recommendations Outpatient PT(already recieving in [place])    Equipment Recommendations  None recommended by PT    Recommendations for Other Services       Precautions / Restrictions Precautions Precautions: Fall Restrictions Weight Bearing Restrictions: No      Mobility  Bed Mobility Overal bed mobility: Modified Independent                Transfers Overall transfer level: Modified independent Equipment used: Rolling walker (2 wheeled)                Ambulation/Gait Ambulation/Gait assistance: Modified independent  (Device/Increase time) Ambulation Distance (Feet): 90 Feet Assistive device: Rolling walker (2 wheeled) Gait Pattern/deviations: WFL(Within Functional Limits)        Stairs            Wheelchair Mobility    Modified Rankin (Stroke Patients Only)       Balance Overall balance assessment: No apparent balance deficits (not formally assessed)                                           Pertinent Vitals/Pain Pain Assessment: 0-10 Pain Score: 7  Pain Location: head Pain Descriptors / Indicators: Aching Pain Intervention(s): Limited activity within patient's tolerance    Home Living Family/patient expects to be discharged to:: Private residence Living Arrangements: Spouse/significant other Available Help at Discharge: Family Type of Home: House Home Access: Level entry     Home Layout: Able to live on main level with bedroom/bathroom Home Equipment: Cane - single point;Walker - 2 wheels      Prior Function Level of Independence: Independent with assistive device(s)               Hand Dominance   Dominant Hand: Right    Extremity/Trunk Assessment        Lower Extremity Assessment Lower Extremity Assessment: Overall WFL for tasks assessed       Communication   Communication: No difficulties  Cognition Arousal/Alertness: Awake/alert Behavior During Therapy: WFL for tasks assessed/performed Overall Cognitive Status: Within Functional Limits for tasks assessed                                        General Comments General comments (skin integrity, edema, etc.): PT states that she does fall periodically at home.  She is going to outpatient therapy in [place] to work her balance     Exercises     Assessment/Plan    PT Assessment All further PT needs can be met in the next venue of care  PT Problem List Decreased balance       PT Treatment Interventions Balance training    PT Goals (Current goals can be found  in the Care Plan section)  Acute Rehab PT Goals Patient Stated Goal: to go home PT Goal Formulation: With patient Time For Goal Achievement: 10/02/17 Potential to Achieve Goals: Good                  End of Session Equipment Utilized During Treatment: Gait belt Activity Tolerance: Patient tolerated treatment well Patient left: in chair;with call bell/phone within reach Nurse Communication: Mobility status PT Visit Diagnosis: History of falling (Z91.81)    Time: 3335-4562 PT Time Calculation (min) (ACUTE ONLY): 20 min   Charges:          Rayetta Humphrey, PT CLT [phone] PT Evaluation $PT Eval Low Complexity: 1 Low        10/01/2017, 9:50 AM

## 2017-10-01 NOTE — Progress Notes (Signed)
  Subjective:  Patient denies abdominal pain nausea or vomiting.  She kept her supper down as well as breakfast this morning.  She developed acute gout involving the right great toe and was begun on Solu-Medrol yesterday.  She also has not had lower extremity tremors anymore.  Tremors resolved after she was given lorazepam.  Objective: Blood pressure 138/75, pulse 67, temperature 98.5 F (36.9 C), temperature source Oral, resp. rate 18, height 5\' 6"  (1.676 m), weight 157 lb 1 oz (71.2 kg), SpO2 100 %. Patient is alert and in no acute distress. She is in a reclining chair. Abdomen is symmetrical.  Bowel sounds are normal.  On palpation is soft and nontender with organomegaly or masses.  Labs/studies Results:  Recent Labs    10/17/2017 0508 09/30/17 0517  WBC 5.0 5.4  HGB 11.4* 12.3  HCT 34.4* 36.9  PLT 178 176    BMET  Recent Labs    Oct 17, 2017 0508 09/30/17 0517  NA 139 139  K 3.6 3.6  CL 103 104  CO2 30 29  GLUCOSE 103* 119*  BUN 7 7  CREATININE 1.25* 1.11*  CALCIUM 9.1 9.1    LFT  Recent Labs    10-17-2017 0508 09/30/17 0517  PROT 6.1* 6.3*  ALBUMIN 3.2* 3.2*  AST 25 22  ALT 17 17  ALKPHOS 76 77  BILITOT 1.3* 1.4*    Esophageal biopsy shows esophagitis secondary to reflux.  No changes to suggest eosinophilic esophagitis or viral etiology.  Assessment:  #1. Abdominal pain nausea and vomiting have resolved.  The symptoms may have been due to the fact that she was not getting her alprazolam on schedule which she apparently had been taking at home.  She did have elevated serum lipase on admission and slight bump in AST leading to work-up.  She has undergone abdominopelvic CT, EGD, MRCP and small bowel follow-through via NGT.  #2.  Erosive reflux esophagitis.  She is presently on double dose PPI but I believe once a day should suffice.   Recommendations:  Decrease pantoprazole to 40 mg p.o. every morning. Stable for discharge from GI standpoint. Will sign  off.

## 2017-10-01 NOTE — Care Management Note (Signed)
Case Management Note  Patient Details  Name: Traci Clark MRN: [id number] Date of Birth: 02-21-1931  Subjective/Objective:    Admitted with N/V. Pt from home, ind pta. Going to OP PT. Has family for support. Has RW she uses with ambulation. She has PCP, transportation and insurance with drug coverage.                 Action/Plan: DC home today. PT recommends resumption of OP PT services. No CM needs noted at this time.   Expected Discharge Date:  09/27/17               Expected Discharge Plan:  Home/Self Care  In-House Referral:  NA  Discharge planning Services  CM Consult  Post Acute Care Choice:  NA Choice offered to:  NA  Status of Service:  Completed, signed off  Sherald Barge, RN 10/01/2017, 10:51 AM

## 2017-10-01 NOTE — Discharge Summary (Signed)
Physician Discharge Summary  Traci Clark ZJI:[id number] DOB: [birth year] DOA: 09/24/2017  PCP: Rory Percy, MD  Admit date: 09/24/2017 Discharge date: 10/01/2017  Time spent: 35 minutes  Recommendations for Outpatient Follow-up:  1. Repeat basic metabolic panel to follow electrolytes and renal function 2. Reassess CBG and further adjust hypoglycemic regimen as needed  3. Follow resolution of acute gout flare, check uric acid and start treatment with allopurinol if indicated.    Discharge Diagnoses:  Principal Problem:   N&V (nausea and vomiting) Active Problems:   GERD (gastroesophageal reflux disease)   RLS (restless legs syndrome)   Dysphagia, pharyngoesophageal phase   History of colon cancer   Diabetes ([place])   Essential hypertension   Gout   Anxiety   Pancreatitis   Generalized abdominal pain Acute on chronic renal failure: stage 2 at baseline  Type 2 diabetes with nephropathy   Discharge Condition: Stable and improved.  Patient instructed to follow-up with PCP in 2 weeks and to resume outpatient follow-up with gastroenterology service as instructed.  Diet recommendation: Modified carbohydrate diet and low-sodium.  Filed Weights   09/24/17 1150 09/24/17 1553  Weight: 72.1 kg (159 lb) 71.2 kg (157 lb 1 oz)    History of present illness:  As per H&P written by Dr. Manuella Ghazi on 09/24/17 84 y.o. female with medical history significant for type: With chronic use of insulin, hypertension, gout, anxiety, GERD, restless leg syndrome, colon cancer, and dysphagia with prior esophageal dilation who presents to the emergency department at the urging of her gastroenterologist after being seen in the office.  She was being seen in follow-up and was noted to have ongoing epigastric abdominal pain along with trouble with constipation.  She apparently required an enema last Sunday to have a bowel movement and also complains of dysphasia over the last 2 weeks.  She apparently had some fish last  Friday and since then seems to think she may have a fishbone in her throat.  She is also anorexic and complains of having had a fever last night.  Of note, she had a rectal exam at the GI office with no findings of fecal impaction. She states that she is does have some blood in her stool about 2 weeks ago, but denies any further dark, tarry, stools or any blood in her stools.  She denies any hematemesis.   ED Course: Vital signs are stable and lipase noted to be 276.  AST 34, ALT 21, total bilirubin 1.2.  CT of the abdomen and pelvis with contrast demonstrates intra-and extra hepatic biliary ductal dilatation with prior cholecystectomy.  She has been given some IV fluid, morphine, and Zofran.  Hospital Course:  1-intractable nausea and vomiting: In the setting of acute pancreatitis -Patient symptoms has improved and essentially resolved by now. -Extensive work-up including CT scan, MRCP, endoscopy, and a small bowel throughput demonstrated no major abnormalities. -patient tolerating diet at discharge  -advise to follow low fat diet and to increase fiber intake. -patient encouraged to maintain adequate hydration and to take medications as prescribed.   2-Type 2 diabetes with hyperglycemia and nephropathy: on chronic use of insulin  -At this moment elevated blood sugar most likely associated with the use of the steroids -Please reassess CBGs and check an A1c level as an outpatient -Patient advised to closely watch amount of carbohydrates in her diet. -resume home hypoglycemic regimen   3-dysphasia in the setting of prior esophageal dilatation: -Appears to be resolved -Patient without any difficulty swallowing currently -Continue  PPI  4-gastroesophageal reflux disease/erosive esophagitis  -continue PPI  5-anxiety -continue PRN Xanax   6-RLS -continue neurontin   7-acute on chronic Gout: -with flare in her right toe -treated with steroids and with good results -outpatient follow up  with initiation of allopurinol if needed  -check uric acid level  8-essential HTN -stable and well controlled -continue current antihypertensive regimen   9-acute on chronic renal failure: -patient with stage 2 at baseline  -most likely triggered due to pre-renal azotemia -after IVF's, renal function is essentially back to baseline   Procedures:  See below for x-ray reports.  Endoscopy: Demonstrating erosive reflux esophagitis.  Consultations:  Gastroenterology service  Discharge Exam: Vitals:   10/01/17 0349 10/01/17 1323  BP: 138/75 (!) 151/89  Pulse: 67 69  Resp:  18  Temp: 98.5 F (36.9 C) (!) 97.5 F (36.4 C)  SpO2: 100% 100%    General: Afebrile, no chest pain, shortness of breath, no nausea, no vomiting; patient feeling significantly improved denying any acute distress currently. Cardiovascular: S1 and S2, no rubs, no gallops, normal, no JVD. Respiratory: Good air movement bilaterally, no wheezing, no crackles Abdomen: Soft, nontender, nondistended, positive bowel sounds. Extremities: No edema, no cyanosis, no clubbing.  Discharge Instructions   Discharge Instructions    Call MD for:  persistant nausea and vomiting   Complete by:  As directed    Diet - low sodium heart healthy   Complete by:  As directed    Diet - low sodium heart healthy   Complete by:  As directed    Discharge instructions   Complete by:  As directed    Take medications as prescribed Resume outpatient physical therapy for further rehabilitation as previously instructed Arrange follow-up with your PCP in 2 weeks Follow heart healthy diet and keep yourself well-hydrated Remember to increase fiber intake in your diet.   Increase activity slowly   Complete by:  As directed      Allergies as of 10/01/2017      Reactions   Amoxicillin Anaphylaxis, Rash   "Skin peeled off"   Macrobid [nitrofurantoin Monohyd Macro] Anaphylaxis, Rash, Other (See Comments)   "Skin peeled off"   Codeine  Nausea And Vomiting, Other (See Comments)   shaking   Darvon [propoxyphene] Nausea And Vomiting, Other (See Comments)   shaking      Medication List    STOP taking these medications   esomeprazole 40 MG capsule Commonly known as:  NEXIUM     TAKE these medications   acetaminophen 325 MG tablet Commonly known as:  TYLENOL Take 650 mg by mouth every 6 (six) hours as needed for pain.   ALPRAZolam 0.5 MG tablet Commonly known as:  XANAX Take 1 tablet (0.5 mg total) by mouth 3 (three) times daily as needed. What changed:    when to take this  reasons to take this   aspirin 81 MG tablet Take 1 tablet (81 mg total) by mouth daily.   colchicine 0.6 MG tablet Take 0.6 mg by mouth as needed.   gabapentin 300 MG capsule Commonly known as:  NEURONTIN Take 300 mg by mouth at bedtime.   HAIR SKIN AND NAILS FORMULA Tabs Take 1 tablet by mouth every morning.   hydrochlorothiazide 25 MG tablet Commonly known as:  HYDRODIURIL Take 25 mg by mouth daily.   insulin glargine 100 UNIT/ML injection Commonly known as:  LANTUS Inject 10 Units into the skin at bedtime.   insulin lispro 100 UNIT/ML injection Commonly  known as:  HUMALOG Inject 4-10 Units into the skin 3 (three) times daily before meals. Sliding scale 151- 200=4 units; 201-250= 6 units; 251-300= 8 units; 301-350= 10 units.   meclizine 25 MG tablet Commonly known as:  ANTIVERT Take 25 mg by mouth 3 (three) times daily as needed for dizziness.   ondansetron 8 MG disintegrating tablet Commonly known as:  ZOFRAN-ODT Take 1 tablet by mouth every 6 (six) hours as needed for nausea or vomiting.   pantoprazole 40 MG tablet Commonly known as:  PROTONIX Take 1 tablet (40 mg total) by mouth 2 (two) times daily before a meal.   polyethylene glycol packet Commonly known as:  MIRALAX / GLYCOLAX Take 17 g by mouth daily.   predniSONE 20 MG tablet Commonly known as:  DELTASONE Take 1 tablet (20 mg total) by mouth daily for 5  days.   senna-docusate 8.6-50 MG tablet Commonly known as:  Senokot-S Take 1 tablet by mouth 2 (two) times daily.      Allergies  Allergen Reactions  . Amoxicillin Anaphylaxis and Rash    "Skin peeled off"  . Macrobid [Nitrofurantoin Monohyd Macro] Anaphylaxis, Rash and Other (See Comments)    "Skin peeled off"  . Codeine Nausea And Vomiting and Other (See Comments)    shaking  . Darvon [Propoxyphene] Nausea And Vomiting and Other (See Comments)    shaking   Follow-up Information    Rory Percy, MD Follow up in 2 week(s).   Specialty:  Family Medicine Contact information: [address] [place] [address] [phone]        Rogene Houston, MD Follow up in 2 week(s).   Specialty:  Gastroenterology Contact information: [place], [address] [place] [address] [phone]           The results of significant diagnostics from this hospitalization (including imaging, microbiology, ancillary and laboratory) are listed below for reference.    Significant Diagnostic Studies: Dg Chest 2 View  Result Date: 09/24/2017 CLINICAL DATA:  Chest congestion, abdominal pain. History of colonic malignancy. EXAM: CHEST - 2 VIEW COMPARISON:  Chest x-ray of October 24, 2016 FINDINGS: The lungs are well-expanded. There is no focal infiltrate. The lung markings are coarse at the left base which is a chronic finding. There is no pleural effusion. The heart and pulmonary vascularity are normal. The mediastinum is normal in width. The bony thorax exhibits no acute abnormality. IMPRESSION: Mild chronic bronchitic changes, stable. No acute cardiopulmonary abnormality. Electronically Signed   By: David  Martinique M.D.   On: 09/24/2017 13:17   Ct Soft Tissue Neck Wo Contrast  Result Date: 09/24/2017 CLINICAL DATA:  Difficulty swallowing since swallowing a fishbone on 09/20/2017. EXAM: CT NECK WITHOUT CONTRAST TECHNIQUE: Multidetector CT imaging of the neck was performed following the standard  protocol without intravenous contrast. COMPARISON:  Cervical spine CT 05/23/2017 FINDINGS: Pharynx and larynx: Unchanged pharyngeal soft tissue contours compared to the prior CT without evidence of foreign body or mass on this unenhanced study. No fluid collection or significant inflammatory changes in the parapharyngeal or retropharyngeal spaces. Salivary glands: No inflammation, mass, or stone. Thyroid: Unremarkable. Lymph nodes: No enlarged or suspicious lymph nodes in the neck. Vascular: Retropharyngeal course of the right internal carotid artery with associated impression on the posterior or pharyngeal wall. Mild calcified atherosclerosis at the right carotid bifurcation. Limited intracranial: Unremarkable. Visualized orbits: Unremarkable. Mastoids and visualized paranasal sinuses: Clear. Skeleton: The patient is edentulous. No acute osseous abnormality or suspicious osseous  lesion is identified. Mild cervical disc degeneration is noted as well as advanced left C4-5 facet arthrosis. Upper chest: Clear lung apices. Other: None. IMPRESSION: No foreign body or other acute abnormality identified in the neck. Electronically Signed   By: Logan Bores M.D.   On: 09/24/2017 13:40   Ct Abdomen Pelvis W Contrast  Result Date: 09/24/2017 CLINICAL DATA:  84 year old female with abdominal pain and constipation since 09/19/2017. History of colon cancer. Initial encounter. EXAM: CT ABDOMEN AND PELVIS WITH CONTRAST TECHNIQUE: Multidetector CT imaging of the abdomen and pelvis was performed using the standard protocol following bolus administration of intravenous contrast. CONTRAST:  149mL ISOVUE-300 IOPAMIDOL (ISOVUE-300) INJECTION 61% COMPARISON:  07/29/2017, 08/30/2016 and 11/16/2015 CT. FINDINGS: Lower chest: Minimal scarring lung bases.  Mild cardiomegaly. Hepatobiliary: Stable since 2017 are 8 mm hypodensity dome of the right lobe of liver and 1.1 cm hypodensity posterior aspect right lobe liver. Post cholecystectomy  with intrahepatic and extrahepatic biliary duct dilation. Degree of biliary duct prominence appears to have progressed slightly when compared to 2017 examination. No calcified common bile duct stone or obstructing mass. It is possible this is normal for this patient however if there were any abnormal liver enzymes, further investigation with MRCP/ERCP may be considered. Pancreas: No pancreatic mass or inflammation. Spleen: No splenic mass or enlargement. Adrenals/Urinary Tract: No hydronephrosis or obstructing stone. Multiple renal lesions. Majority of these are cysts. As noted on recent CT, increase in size of hyperdense structure within the left upper pole measuring 3.4 cm with Hounsfield units of 37 (series 7, image 11). It is possible this represents a hyperdense cyst but would require MR with and without contrast to confirm such and excludes cystic mass. If this exam is performed, attention to hyperdense 1.6 cm lesion medial aspect right kidney (series 7, image 16). This appears of similar size to prior exams. Noncontrast filled views the urinary bladder unremarkable. Stomach/Bowel: Partial colonic resection. No extraluminal inflammatory process. Distal transverse colon narrowing may be related to under distension (series 2, images 40 and 41). However, this appears slightly hyperdense, subtle inflammation could not excluded in proper clinical setting. Underlying mass less likely consideration as this appeared normal on 07/30/2027 CT. Vascular/Lymphatic: Atherosclerotic changes of ectatic aorta without focal aneurysm. Mild dilation distal common iliac arteries measuring up to 1.8 cm on the right 1.7 cm on the left. No large vessel occlusion. No adenopathy. Reproductive: No worrisome uterine or adnexal mass. Other: No free intraperitoneal air or bowel containing hernia. Musculoskeletal: Severe left hip joint degenerative changes with subchondral cysts. Moderate right hip joint degenerative changes. Degenerative  changes lower thoracic and lumbar spine. IMPRESSION: Partial colonic resection. No extraluminal inflammatory process. Distal transverse colon narrowing may be related to under distension (series 2, images 40 and 41). However, this appears slightly hyperdense and subtle inflammation could not excluded in proper clinical setting. Underlying mass less likely consideration as this appeared normal on 07/30/2027 CT. Post cholecystectomy with intrahepatic and extrahepatic biliary duct dilation. It is possible this is normal for this patient however if there were abnormal liver enzymes, further investigation with MRCP/ERCP may be considered. Hyperdense renal lesions as noted on recent CT. Dedicated renal MR with contrast would be necessary for further delineation if clinically desired. Aortic Atherosclerosis (ICD10-I70.0). Ectatic aorta. No focal aortic aneurysm. Slightly dilated distal common iliac arteries. Marked bilateral hip joint degenerative change greater on left. Electronically Signed   By: Genia Del M.D.   On: 09/24/2017 14:11   Dg Small Bowel  Result  Date: 09/30/2017 CLINICAL DATA:  Nausea, vomiting, history of colon cancer post surgery EXAM: SMALL BOWEL SERIES COMPARISON:  CT abdomen and pelvis 09/25/2011 TECHNIQUE: Following ingestion of thin barium, serial small bowel images were obtained including spot views of the terminal ileum. FLUOROSCOPY TIME:  Fluoroscopy Time:  0 minutes 36 seconds Radiation Exposure Index (if provided by the fluoroscopic device): 7.8 mGy Number of Acquired Spot Images: 5 plus multiple fluoroscopic screen captures FINDINGS: Normal bowel gas pattern on scout image. Tip of nasogastric tube projects over gastric antrum. Anastomotic staple line lateral RIGHT mid abdomen from prior colonic resection. Osseous demineralization with degenerative changes of the lumbar spine and LEFT hip. Numerous pelvic phleboliths. Combination of thin barium and water-soluble contrast was administered  via nasogastric tube. Mildly accelerated transit of contrast through small bowel to RIGHT colon 45 minutes. No gastric outlet obstruction. Normal duodenum morphology. Jejunal and ileal small bowel loops normal appearance with normal mucosal fold pattern. No bowel wall or mucosal fold thickening. Prior ileocecectomy with patent ileocolic anastomosis and RIGHT mid abdomen. Compression views of the distal small bowel are unremarkable. Residual stool at splenic flexure. IMPRESSION: Prior RIGHT ileocolectomy. Otherwise normal exam. Electronically Signed   By: Lavonia Dana M.D.   On: 09/30/2017 11:03   Mr 3d Recon At Scanner  Result Date: 09/29/2017 CLINICAL DATA:  Pancreatitis. Biliary dilatation. Small hyperdense renal lesions. EXAM: MRI ABDOMEN WITHOUT AND WITH CONTRAST (INCLUDING MRCP) TECHNIQUE: Multiplanar multisequence MR imaging of the abdomen was performed both before and after the administration of intravenous contrast. Heavily T2-weighted images of the biliary and pancreatic ducts were obtained, and three-dimensional MRCP images were rendered by post processing. CONTRAST:  67mL MULTIHANCE GADOBENATE DIMEGLUMINE 529 MG/ML IV SOLN COMPARISON:  09/24/2017 FINDINGS: Despite efforts by the technologist and patient, motion artifact is present on today's exam and could not be eliminated. This is a common result when MRCP is attempted in the inpatient setting where patients are less likely to be able to breath hold and cooperate in controlling motion. This reduces exam sensitivity and specificity. Lower chest: Mild cardiomegaly. Subsegmental atelectasis in both lower lobes. Hepatobiliary: Diffuse mild intrahepatic biliary dilatation without overt beading or irregularity. Common hepatic duct measures 0.9 cm in diameter, common bile duct 0.8 cm. Subject to the limitations of the motion artifact, I do not see a filling defect. The cystic duct remnant is slightly distended. There is a small cyst posteriorly in the right  hepatic lobe on image 37/5007, and a similar lesion in the dome of the right hepatic lobe on image 21. Pancreas: Borderline dilated dorsal pancreatic duct. Otherwise unremarkable. Spleen:  Unremarkable Adrenals/Urinary Tract:  Adrenal glands normal. Bilateral Bosniak category 2 and category 1 cysts are present. There are fluid-levels in the Bosniak category 2 cysts of the right kidney upper pole. On subtraction images I do not see a significant degree of enhancement to suggest malignancy involving these renal lesions. Stomach/Bowel: Unremarkable Vascular/Lymphatic: Aortoiliac atherosclerotic vascular disease. No pathologic adenopathy. Other:  No supplemental non-categorized findings. Musculoskeletal: Lumbar spondylosis and degenerative disc disease. Degenerative hip arthropathy bilaterally, especially severe on the left. IMPRESSION: 1. Mild intrahepatic biliary dilatation with the common bile duct essentially within normal limits, and slightly dilated cystic duct remnant. Much of this is likely a physiologic response to cholecystectomy. The dorsal pancreatic duct is borderline dilated. No discrete pancreatic mass is identified. 2. Bilateral complex Bosniak category 2 renal cysts appear benign. 3.  Aortic Atherosclerosis (ICD10-I70.0). 4. Lumbar spondylosis and degenerative disc disease. 5. Degenerative left  hip arthropathy. 6. Mild cardiomegaly. Electronically Signed   By: Van Clines M.D.   On: 09/29/2017 13:55   Dg Chest Port 1 View  Result Date: 09/30/2017 CLINICAL DATA:  Nasogastric tube placement EXAM: PORTABLE CHEST 1 VIEW COMPARISON:  Portable exam 0827 hours compared to 09/24/2017 FINDINGS: Nasogastric tube extends into stomach. Upper normal heart size. Mediastinal contours and pulmonary vascularity normal. LEFT basilar atelectasis. Lungs otherwise clear. No pleural effusion or pneumothorax. Bones demineralized with LEFT glenohumeral degenerative changes noted. IMPRESSION: Nasogastric tube extends  into stomach. Subsegmental atelectasis LEFT base. Electronically Signed   By: Lavonia Dana M.D.   On: 09/30/2017 08:55   Mr Abdomen Mrcp W Wo Contast  Result Date: 09/29/2017 CLINICAL DATA:  Pancreatitis. Biliary dilatation. Small hyperdense renal lesions. EXAM: MRI ABDOMEN WITHOUT AND WITH CONTRAST (INCLUDING MRCP) TECHNIQUE: Multiplanar multisequence MR imaging of the abdomen was performed both before and after the administration of intravenous contrast. Heavily T2-weighted images of the biliary and pancreatic ducts were obtained, and three-dimensional MRCP images were rendered by post processing. CONTRAST:  85mL MULTIHANCE GADOBENATE DIMEGLUMINE 529 MG/ML IV SOLN COMPARISON:  09/24/2017 FINDINGS: Despite efforts by the technologist and patient, motion artifact is present on today's exam and could not be eliminated. This is a common result when MRCP is attempted in the inpatient setting where patients are less likely to be able to breath hold and cooperate in controlling motion. This reduces exam sensitivity and specificity. Lower chest: Mild cardiomegaly. Subsegmental atelectasis in both lower lobes. Hepatobiliary: Diffuse mild intrahepatic biliary dilatation without overt beading or irregularity. Common hepatic duct measures 0.9 cm in diameter, common bile duct 0.8 cm. Subject to the limitations of the motion artifact, I do not see a filling defect. The cystic duct remnant is slightly distended. There is a small cyst posteriorly in the right hepatic lobe on image 37/5007, and a similar lesion in the dome of the right hepatic lobe on image 21. Pancreas: Borderline dilated dorsal pancreatic duct. Otherwise unremarkable. Spleen:  Unremarkable Adrenals/Urinary Tract:  Adrenal glands normal. Bilateral Bosniak category 2 and category 1 cysts are present. There are fluid-levels in the Bosniak category 2 cysts of the right kidney upper pole. On subtraction images I do not see a significant degree of enhancement to  suggest malignancy involving these renal lesions. Stomach/Bowel: Unremarkable Vascular/Lymphatic: Aortoiliac atherosclerotic vascular disease. No pathologic adenopathy. Other:  No supplemental non-categorized findings. Musculoskeletal: Lumbar spondylosis and degenerative disc disease. Degenerative hip arthropathy bilaterally, especially severe on the left. IMPRESSION: 1. Mild intrahepatic biliary dilatation with the common bile duct essentially within normal limits, and slightly dilated cystic duct remnant. Much of this is likely a physiologic response to cholecystectomy. The dorsal pancreatic duct is borderline dilated. No discrete pancreatic mass is identified. 2. Bilateral complex Bosniak category 2 renal cysts appear benign. 3.  Aortic Atherosclerosis (ICD10-I70.0). 4. Lumbar spondylosis and degenerative disc disease. 5. Degenerative left hip arthropathy. 6. Mild cardiomegaly. Electronically Signed   By: Van Clines M.D.   On: 09/29/2017 13:55    Microbiology: Recent Results (from the past 240 hour(s))  Urine culture     Status: Abnormal   Collection Time: 09/24/17  1:43 PM  Result Value Ref Range Status   Specimen Description   Final    URINE, RANDOM Performed at [facility], [address], [place], [place] [address]    Special Requests   Final    NONE Performed at [facility], [address], [place], [place] [address]    Culture (A)  Final    30,000 COLONIES/mL GROUP B STREP(S.AGALACTIAE)ISOLATED TESTING AGAINST S. AGALACTIAE NOT ROUTINELY PERFORMED DUE TO PREDICTABILITY OF AMP/PEN/VAN SUSCEPTIBILITY. Performed at [facility], [place] [address], [place], [place] [address]    Report Status 09/25/2017 FINAL  Final     Labs: Basic Metabolic Panel: Recent Labs  Lab 09/25/17 0613 09/26/17 0616 09/27/17 0505 09/29/17 0508 09/30/17 0517  NA 140 138 139 139 139  K 4.1 4.0 3.8 3.6 3.6  CL 101 101 103 103 104  CO2 32 29 30 30 29   GLUCOSE 108* 96 147* 103* 119*  BUN 10  11 12 7 7   CREATININE 1.22* 1.21* 1.34* 1.25* 1.11*  CALCIUM 9.5 9.3 9.2 9.1 9.1   Liver Function Tests: Recent Labs  Lab 09/25/17 0613 09/26/17 0616 09/27/17 0505 09/29/17 0508 09/30/17 0517  AST 39 50* 33 25 22  ALT 21 29 24 17 17   ALKPHOS 83 86 77 76 77  BILITOT 1.8* 2.1*  2.0* 1.4* 1.3* 1.4*  PROT 6.7 6.3* 6.2* 6.1* 6.3*  ALBUMIN 3.4* 3.4* 3.2* 3.2* 3.2*   Recent Labs  Lab 09/25/17 0613 09/27/17 0505  LIPASE 70* 29   CBC: Recent Labs  Lab 09/25/17 0613 09/26/17 0616 09/27/17 0505 09/29/17 0508 09/30/17 0517  WBC 4.9 4.4 4.7 5.0 5.4  HGB 12.2 11.9* 11.4* 11.4* 12.3  HCT 36.8 36.1 34.4* 34.4* 36.9  MCV 90.4 90.3 89.8 89.4 88.9  PLT 188 164 184 178 176    CBG: Recent Labs  Lab 09/30/17 1617 09/30/17 2115 10/01/17 0722 10/01/17 1111 10/01/17 1615  GLUCAP 206* 199* 157* 221* 180*    Signed:  Barton Dubois MD.  [facility] 10/01/2017, 5:11 PM

## 2017-10-01 NOTE — Progress Notes (Signed)
Patient is to be discharged home and in stable condition. Patient's IV removed,  WNL. Patient given discharge instructions and verbalized understanding. Patient will be escorted out by staff via wheelchair when ready.  Celestia Khat, RN

## 2017-10-06 DIAGNOSIS — M109 Gout, unspecified: Secondary | ICD-10-CM | POA: Diagnosis not present

## 2017-10-08 DIAGNOSIS — M19071 Primary osteoarthritis, right ankle and foot: Secondary | ICD-10-CM | POA: Diagnosis not present

## 2017-10-08 DIAGNOSIS — M109 Gout, unspecified: Secondary | ICD-10-CM | POA: Diagnosis not present

## 2017-10-29 DIAGNOSIS — M79662 Pain in left lower leg: Secondary | ICD-10-CM | POA: Diagnosis not present

## 2017-10-29 DIAGNOSIS — E119 Type 2 diabetes mellitus without complications: Secondary | ICD-10-CM | POA: Diagnosis not present

## 2017-10-29 DIAGNOSIS — Z86718 Personal history of other venous thrombosis and embolism: Secondary | ICD-10-CM | POA: Diagnosis not present

## 2017-10-29 DIAGNOSIS — R69 Illness, unspecified: Secondary | ICD-10-CM | POA: Diagnosis not present

## 2017-10-29 DIAGNOSIS — R7989 Other specified abnormal findings of blood chemistry: Secondary | ICD-10-CM | POA: Diagnosis not present

## 2017-10-29 DIAGNOSIS — I10 Essential (primary) hypertension: Secondary | ICD-10-CM | POA: Diagnosis not present

## 2017-10-29 DIAGNOSIS — M79605 Pain in left leg: Secondary | ICD-10-CM | POA: Diagnosis not present

## 2017-10-29 DIAGNOSIS — R300 Dysuria: Secondary | ICD-10-CM | POA: Diagnosis not present

## 2017-10-29 DIAGNOSIS — M79604 Pain in right leg: Secondary | ICD-10-CM | POA: Diagnosis not present

## 2017-10-29 DIAGNOSIS — M79661 Pain in right lower leg: Secondary | ICD-10-CM | POA: Diagnosis not present

## 2017-10-29 DIAGNOSIS — Z794 Long term (current) use of insulin: Secondary | ICD-10-CM | POA: Diagnosis not present

## 2017-10-29 DIAGNOSIS — K219 Gastro-esophageal reflux disease without esophagitis: Secondary | ICD-10-CM | POA: Diagnosis not present

## 2017-10-30 DIAGNOSIS — M79605 Pain in left leg: Secondary | ICD-10-CM | POA: Diagnosis not present

## 2017-10-30 DIAGNOSIS — R2243 Localized swelling, mass and lump, lower limb, bilateral: Secondary | ICD-10-CM | POA: Diagnosis not present

## 2017-10-30 DIAGNOSIS — M79604 Pain in right leg: Secondary | ICD-10-CM | POA: Diagnosis not present

## 2017-10-30 DIAGNOSIS — M7989 Other specified soft tissue disorders: Secondary | ICD-10-CM | POA: Diagnosis not present

## 2017-10-30 DIAGNOSIS — M79662 Pain in left lower leg: Secondary | ICD-10-CM | POA: Diagnosis not present

## 2017-10-30 DIAGNOSIS — R791 Abnormal coagulation profile: Secondary | ICD-10-CM | POA: Diagnosis not present

## 2017-10-30 DIAGNOSIS — M79661 Pain in right lower leg: Secondary | ICD-10-CM | POA: Diagnosis not present

## 2017-10-31 DIAGNOSIS — E1143 Type 2 diabetes mellitus with diabetic autonomic (poly)neuropathy: Secondary | ICD-10-CM | POA: Diagnosis not present

## 2017-10-31 DIAGNOSIS — R69 Illness, unspecified: Secondary | ICD-10-CM | POA: Diagnosis not present

## 2017-10-31 DIAGNOSIS — E1165 Type 2 diabetes mellitus with hyperglycemia: Secondary | ICD-10-CM | POA: Diagnosis not present

## 2017-10-31 DIAGNOSIS — E1140 Type 2 diabetes mellitus with diabetic neuropathy, unspecified: Secondary | ICD-10-CM | POA: Diagnosis not present

## 2017-10-31 DIAGNOSIS — M109 Gout, unspecified: Secondary | ICD-10-CM | POA: Diagnosis not present

## 2017-10-31 DIAGNOSIS — K3184 Gastroparesis: Secondary | ICD-10-CM | POA: Diagnosis not present

## 2017-10-31 DIAGNOSIS — R42 Dizziness and giddiness: Secondary | ICD-10-CM | POA: Diagnosis not present

## 2017-11-05 DIAGNOSIS — R609 Edema, unspecified: Secondary | ICD-10-CM | POA: Diagnosis not present

## 2017-11-05 DIAGNOSIS — Z6827 Body mass index (BMI) 27.0-27.9, adult: Secondary | ICD-10-CM | POA: Diagnosis not present

## 2017-11-05 DIAGNOSIS — M79606 Pain in leg, unspecified: Secondary | ICD-10-CM | POA: Diagnosis not present

## 2017-12-19 DIAGNOSIS — R69 Illness, unspecified: Secondary | ICD-10-CM | POA: Diagnosis not present

## 2017-12-19 DIAGNOSIS — R42 Dizziness and giddiness: Secondary | ICD-10-CM | POA: Diagnosis not present

## 2017-12-19 DIAGNOSIS — E1143 Type 2 diabetes mellitus with diabetic autonomic (poly)neuropathy: Secondary | ICD-10-CM | POA: Diagnosis not present

## 2017-12-27 DIAGNOSIS — R69 Illness, unspecified: Secondary | ICD-10-CM | POA: Diagnosis not present

## 2018-01-07 DIAGNOSIS — R270 Ataxia, unspecified: Secondary | ICD-10-CM | POA: Diagnosis not present

## 2018-01-07 DIAGNOSIS — R001 Bradycardia, unspecified: Secondary | ICD-10-CM | POA: Diagnosis not present

## 2018-01-07 DIAGNOSIS — M25552 Pain in left hip: Secondary | ICD-10-CM | POA: Diagnosis not present

## 2018-01-07 DIAGNOSIS — C189 Malignant neoplasm of colon, unspecified: Secondary | ICD-10-CM | POA: Diagnosis not present

## 2018-01-07 DIAGNOSIS — I10 Essential (primary) hypertension: Secondary | ICD-10-CM | POA: Diagnosis not present

## 2018-01-07 DIAGNOSIS — E782 Mixed hyperlipidemia: Secondary | ICD-10-CM | POA: Diagnosis not present

## 2018-01-07 DIAGNOSIS — I83893 Varicose veins of bilateral lower extremities with other complications: Secondary | ICD-10-CM | POA: Diagnosis not present

## 2018-01-07 DIAGNOSIS — G459 Transient cerebral ischemic attack, unspecified: Secondary | ICD-10-CM | POA: Diagnosis not present

## 2018-01-07 DIAGNOSIS — R9431 Abnormal electrocardiogram [ECG] [EKG]: Secondary | ICD-10-CM | POA: Diagnosis not present

## 2018-01-07 DIAGNOSIS — M79661 Pain in right lower leg: Secondary | ICD-10-CM | POA: Diagnosis not present

## 2018-01-14 DIAGNOSIS — E039 Hypothyroidism, unspecified: Secondary | ICD-10-CM | POA: Diagnosis not present

## 2018-01-14 DIAGNOSIS — R1110 Vomiting, unspecified: Secondary | ICD-10-CM | POA: Diagnosis not present

## 2018-01-14 DIAGNOSIS — R1084 Generalized abdominal pain: Secondary | ICD-10-CM | POA: Diagnosis not present

## 2018-01-14 DIAGNOSIS — Z6824 Body mass index (BMI) 24.0-24.9, adult: Secondary | ICD-10-CM | POA: Diagnosis not present

## 2018-01-14 DIAGNOSIS — R300 Dysuria: Secondary | ICD-10-CM | POA: Diagnosis not present

## 2018-01-15 DIAGNOSIS — R69 Illness, unspecified: Secondary | ICD-10-CM | POA: Diagnosis not present

## 2018-01-15 DIAGNOSIS — I10 Essential (primary) hypertension: Secondary | ICD-10-CM | POA: Diagnosis not present

## 2018-01-15 DIAGNOSIS — Z794 Long term (current) use of insulin: Secondary | ICD-10-CM | POA: Diagnosis not present

## 2018-01-15 DIAGNOSIS — I252 Old myocardial infarction: Secondary | ICD-10-CM | POA: Diagnosis not present

## 2018-01-15 DIAGNOSIS — K219 Gastro-esophageal reflux disease without esophagitis: Secondary | ICD-10-CM | POA: Diagnosis not present

## 2018-01-15 DIAGNOSIS — G8929 Other chronic pain: Secondary | ICD-10-CM | POA: Diagnosis not present

## 2018-01-15 DIAGNOSIS — E1143 Type 2 diabetes mellitus with diabetic autonomic (poly)neuropathy: Secondary | ICD-10-CM | POA: Diagnosis not present

## 2018-01-15 DIAGNOSIS — G2581 Restless legs syndrome: Secondary | ICD-10-CM | POA: Diagnosis not present

## 2018-01-15 DIAGNOSIS — I951 Orthostatic hypotension: Secondary | ICD-10-CM | POA: Diagnosis not present

## 2018-01-15 DIAGNOSIS — E1140 Type 2 diabetes mellitus with diabetic neuropathy, unspecified: Secondary | ICD-10-CM | POA: Diagnosis not present

## 2018-01-20 DIAGNOSIS — E119 Type 2 diabetes mellitus without complications: Secondary | ICD-10-CM | POA: Diagnosis not present

## 2018-01-20 DIAGNOSIS — I10 Essential (primary) hypertension: Secondary | ICD-10-CM | POA: Diagnosis not present

## 2018-01-20 DIAGNOSIS — R9431 Abnormal electrocardiogram [ECG] [EKG]: Secondary | ICD-10-CM | POA: Diagnosis not present

## 2018-01-20 DIAGNOSIS — G458 Other transient cerebral ischemic attacks and related syndromes: Secondary | ICD-10-CM | POA: Diagnosis not present

## 2018-01-22 DIAGNOSIS — I70213 Atherosclerosis of native arteries of extremities with intermittent claudication, bilateral legs: Secondary | ICD-10-CM | POA: Diagnosis not present

## 2018-01-22 DIAGNOSIS — E1151 Type 2 diabetes mellitus with diabetic peripheral angiopathy without gangrene: Secondary | ICD-10-CM | POA: Diagnosis not present

## 2018-01-26 DIAGNOSIS — M48062 Spinal stenosis, lumbar region with neurogenic claudication: Secondary | ICD-10-CM | POA: Diagnosis not present

## 2018-01-26 DIAGNOSIS — M1711 Unilateral primary osteoarthritis, right knee: Secondary | ICD-10-CM | POA: Diagnosis not present

## 2018-01-26 DIAGNOSIS — M1612 Unilateral primary osteoarthritis, left hip: Secondary | ICD-10-CM | POA: Diagnosis not present

## 2018-01-26 DIAGNOSIS — M87052 Idiopathic aseptic necrosis of left femur: Secondary | ICD-10-CM | POA: Diagnosis not present

## 2018-02-09 DIAGNOSIS — M1612 Unilateral primary osteoarthritis, left hip: Secondary | ICD-10-CM | POA: Diagnosis not present

## 2018-02-09 DIAGNOSIS — M87052 Idiopathic aseptic necrosis of left femur: Secondary | ICD-10-CM | POA: Diagnosis not present

## 2018-02-12 DIAGNOSIS — K219 Gastro-esophageal reflux disease without esophagitis: Secondary | ICD-10-CM | POA: Diagnosis not present

## 2018-02-12 DIAGNOSIS — H409 Unspecified glaucoma: Secondary | ICD-10-CM | POA: Diagnosis not present

## 2018-02-12 DIAGNOSIS — E1122 Type 2 diabetes mellitus with diabetic chronic kidney disease: Secondary | ICD-10-CM | POA: Diagnosis not present

## 2018-02-12 DIAGNOSIS — Z01810 Encounter for preprocedural cardiovascular examination: Secondary | ICD-10-CM | POA: Diagnosis not present

## 2018-02-12 DIAGNOSIS — M109 Gout, unspecified: Secondary | ICD-10-CM | POA: Diagnosis not present

## 2018-02-12 DIAGNOSIS — R69 Illness, unspecified: Secondary | ICD-10-CM | POA: Diagnosis not present

## 2018-02-12 DIAGNOSIS — N183 Chronic kidney disease, stage 3 (moderate): Secondary | ICD-10-CM | POA: Diagnosis not present

## 2018-02-12 DIAGNOSIS — M1612 Unilateral primary osteoarthritis, left hip: Secondary | ICD-10-CM | POA: Diagnosis not present

## 2018-02-12 DIAGNOSIS — E860 Dehydration: Secondary | ICD-10-CM | POA: Diagnosis not present

## 2018-02-12 DIAGNOSIS — N179 Acute kidney failure, unspecified: Secondary | ICD-10-CM | POA: Diagnosis not present

## 2018-02-12 DIAGNOSIS — I129 Hypertensive chronic kidney disease with stage 1 through stage 4 chronic kidney disease, or unspecified chronic kidney disease: Secondary | ICD-10-CM | POA: Diagnosis not present

## 2018-02-13 DIAGNOSIS — Z9049 Acquired absence of other specified parts of digestive tract: Secondary | ICD-10-CM | POA: Diagnosis not present

## 2018-02-13 DIAGNOSIS — S098XXA Other specified injuries of head, initial encounter: Secondary | ICD-10-CM | POA: Diagnosis not present

## 2018-02-13 DIAGNOSIS — I10 Essential (primary) hypertension: Secondary | ICD-10-CM | POA: Diagnosis not present

## 2018-02-13 DIAGNOSIS — Z9851 Tubal ligation status: Secondary | ICD-10-CM | POA: Diagnosis not present

## 2018-02-13 DIAGNOSIS — Z886 Allergy status to analgesic agent status: Secondary | ICD-10-CM | POA: Diagnosis not present

## 2018-02-13 DIAGNOSIS — W228XXA Striking against or struck by other objects, initial encounter: Secondary | ICD-10-CM | POA: Diagnosis not present

## 2018-02-13 DIAGNOSIS — Z888 Allergy status to other drugs, medicaments and biological substances status: Secondary | ICD-10-CM | POA: Diagnosis not present

## 2018-02-13 DIAGNOSIS — E119 Type 2 diabetes mellitus without complications: Secondary | ICD-10-CM | POA: Diagnosis not present

## 2018-02-13 DIAGNOSIS — S0990XA Unspecified injury of head, initial encounter: Secondary | ICD-10-CM | POA: Diagnosis not present

## 2018-02-16 DIAGNOSIS — R739 Hyperglycemia, unspecified: Secondary | ICD-10-CM | POA: Diagnosis not present

## 2018-02-16 DIAGNOSIS — K219 Gastro-esophageal reflux disease without esophagitis: Secondary | ICD-10-CM | POA: Diagnosis not present

## 2018-02-16 DIAGNOSIS — E782 Mixed hyperlipidemia: Secondary | ICD-10-CM | POA: Diagnosis not present

## 2018-02-16 DIAGNOSIS — I10 Essential (primary) hypertension: Secondary | ICD-10-CM | POA: Diagnosis not present

## 2018-02-16 DIAGNOSIS — R42 Dizziness and giddiness: Secondary | ICD-10-CM | POA: Diagnosis not present

## 2018-02-16 DIAGNOSIS — E039 Hypothyroidism, unspecified: Secondary | ICD-10-CM | POA: Diagnosis not present

## 2018-02-19 DIAGNOSIS — M1612 Unilateral primary osteoarthritis, left hip: Secondary | ICD-10-CM | POA: Diagnosis not present

## 2018-02-19 DIAGNOSIS — R69 Illness, unspecified: Secondary | ICD-10-CM | POA: Diagnosis not present

## 2018-02-19 DIAGNOSIS — E1122 Type 2 diabetes mellitus with diabetic chronic kidney disease: Secondary | ICD-10-CM | POA: Diagnosis not present

## 2018-02-19 DIAGNOSIS — N179 Acute kidney failure, unspecified: Secondary | ICD-10-CM | POA: Diagnosis not present

## 2018-02-19 DIAGNOSIS — M109 Gout, unspecified: Secondary | ICD-10-CM | POA: Diagnosis not present

## 2018-02-19 DIAGNOSIS — H409 Unspecified glaucoma: Secondary | ICD-10-CM | POA: Diagnosis not present

## 2018-02-19 DIAGNOSIS — E860 Dehydration: Secondary | ICD-10-CM | POA: Diagnosis not present

## 2018-02-19 DIAGNOSIS — N183 Chronic kidney disease, stage 3 (moderate): Secondary | ICD-10-CM | POA: Diagnosis not present

## 2018-02-19 DIAGNOSIS — K219 Gastro-esophageal reflux disease without esophagitis: Secondary | ICD-10-CM | POA: Diagnosis not present

## 2018-02-19 DIAGNOSIS — I129 Hypertensive chronic kidney disease with stage 1 through stage 4 chronic kidney disease, or unspecified chronic kidney disease: Secondary | ICD-10-CM | POA: Diagnosis not present

## 2018-02-24 DIAGNOSIS — R69 Illness, unspecified: Secondary | ICD-10-CM | POA: Diagnosis not present

## 2018-02-24 DIAGNOSIS — I10 Essential (primary) hypertension: Secondary | ICD-10-CM | POA: Diagnosis not present

## 2018-02-24 DIAGNOSIS — I2510 Atherosclerotic heart disease of native coronary artery without angina pectoris: Secondary | ICD-10-CM | POA: Diagnosis not present

## 2018-02-24 DIAGNOSIS — E860 Dehydration: Secondary | ICD-10-CM | POA: Diagnosis not present

## 2018-02-24 DIAGNOSIS — K219 Gastro-esophageal reflux disease without esophagitis: Secondary | ICD-10-CM | POA: Diagnosis not present

## 2018-02-24 DIAGNOSIS — Z96642 Presence of left artificial hip joint: Secondary | ICD-10-CM | POA: Diagnosis not present

## 2018-02-24 DIAGNOSIS — E119 Type 2 diabetes mellitus without complications: Secondary | ICD-10-CM | POA: Diagnosis not present

## 2018-02-24 DIAGNOSIS — M159 Polyosteoarthritis, unspecified: Secondary | ICD-10-CM | POA: Diagnosis not present

## 2018-02-24 DIAGNOSIS — I129 Hypertensive chronic kidney disease with stage 1 through stage 4 chronic kidney disease, or unspecified chronic kidney disease: Secondary | ICD-10-CM | POA: Diagnosis not present

## 2018-02-24 DIAGNOSIS — N183 Chronic kidney disease, stage 3 (moderate): Secondary | ICD-10-CM | POA: Diagnosis not present

## 2018-02-24 DIAGNOSIS — M109 Gout, unspecified: Secondary | ICD-10-CM | POA: Diagnosis not present

## 2018-02-24 DIAGNOSIS — I252 Old myocardial infarction: Secondary | ICD-10-CM | POA: Diagnosis not present

## 2018-02-24 DIAGNOSIS — N179 Acute kidney failure, unspecified: Secondary | ICD-10-CM | POA: Diagnosis not present

## 2018-02-24 DIAGNOSIS — E1122 Type 2 diabetes mellitus with diabetic chronic kidney disease: Secondary | ICD-10-CM | POA: Diagnosis not present

## 2018-02-24 DIAGNOSIS — M1612 Unilateral primary osteoarthritis, left hip: Secondary | ICD-10-CM | POA: Diagnosis not present

## 2018-02-24 DIAGNOSIS — H409 Unspecified glaucoma: Secondary | ICD-10-CM | POA: Diagnosis not present

## 2018-02-28 DIAGNOSIS — I10 Essential (primary) hypertension: Secondary | ICD-10-CM | POA: Diagnosis not present

## 2018-02-28 DIAGNOSIS — Z471 Aftercare following joint replacement surgery: Secondary | ICD-10-CM | POA: Diagnosis not present

## 2018-02-28 DIAGNOSIS — R69 Illness, unspecified: Secondary | ICD-10-CM | POA: Diagnosis not present

## 2018-02-28 DIAGNOSIS — Z96642 Presence of left artificial hip joint: Secondary | ICD-10-CM | POA: Diagnosis not present

## 2018-02-28 DIAGNOSIS — E119 Type 2 diabetes mellitus without complications: Secondary | ICD-10-CM | POA: Diagnosis not present

## 2018-02-28 DIAGNOSIS — Z794 Long term (current) use of insulin: Secondary | ICD-10-CM | POA: Diagnosis not present

## 2018-04-09 DIAGNOSIS — Z96642 Presence of left artificial hip joint: Secondary | ICD-10-CM | POA: Diagnosis not present

## 2018-05-06 DIAGNOSIS — Z0001 Encounter for general adult medical examination with abnormal findings: Secondary | ICD-10-CM | POA: Diagnosis not present

## 2018-05-06 DIAGNOSIS — R69 Illness, unspecified: Secondary | ICD-10-CM | POA: Diagnosis not present

## 2018-05-06 DIAGNOSIS — K219 Gastro-esophageal reflux disease without esophagitis: Secondary | ICD-10-CM | POA: Diagnosis not present

## 2018-05-06 DIAGNOSIS — E039 Hypothyroidism, unspecified: Secondary | ICD-10-CM | POA: Diagnosis not present

## 2018-05-06 DIAGNOSIS — G2581 Restless legs syndrome: Secondary | ICD-10-CM | POA: Diagnosis not present

## 2018-05-06 DIAGNOSIS — E1165 Type 2 diabetes mellitus with hyperglycemia: Secondary | ICD-10-CM | POA: Diagnosis not present

## 2018-05-06 DIAGNOSIS — Z6825 Body mass index (BMI) 25.0-25.9, adult: Secondary | ICD-10-CM | POA: Diagnosis not present

## 2018-05-06 DIAGNOSIS — Z23 Encounter for immunization: Secondary | ICD-10-CM | POA: Diagnosis not present

## 2018-05-06 DIAGNOSIS — E1140 Type 2 diabetes mellitus with diabetic neuropathy, unspecified: Secondary | ICD-10-CM | POA: Diagnosis not present

## 2018-06-12 DIAGNOSIS — Z96642 Presence of left artificial hip joint: Secondary | ICD-10-CM | POA: Diagnosis not present

## 2018-06-16 DIAGNOSIS — H5203 Hypermetropia, bilateral: Secondary | ICD-10-CM | POA: Diagnosis not present

## 2018-06-16 DIAGNOSIS — H52221 Regular astigmatism, right eye: Secondary | ICD-10-CM | POA: Diagnosis not present

## 2018-06-16 DIAGNOSIS — H26493 Other secondary cataract, bilateral: Secondary | ICD-10-CM | POA: Diagnosis not present

## 2018-06-16 DIAGNOSIS — H25813 Combined forms of age-related cataract, bilateral: Secondary | ICD-10-CM | POA: Diagnosis not present

## 2018-06-16 DIAGNOSIS — E119 Type 2 diabetes mellitus without complications: Secondary | ICD-10-CM | POA: Diagnosis not present

## 2018-06-16 DIAGNOSIS — H3530 Unspecified macular degeneration: Secondary | ICD-10-CM | POA: Diagnosis not present

## 2018-06-16 DIAGNOSIS — H524 Presbyopia: Secondary | ICD-10-CM | POA: Diagnosis not present

## 2018-06-16 DIAGNOSIS — H43813 Vitreous degeneration, bilateral: Secondary | ICD-10-CM | POA: Diagnosis not present

## 2018-07-13 DIAGNOSIS — R69 Illness, unspecified: Secondary | ICD-10-CM | POA: Diagnosis not present

## 2018-07-13 DIAGNOSIS — K219 Gastro-esophageal reflux disease without esophagitis: Secondary | ICD-10-CM | POA: Diagnosis not present

## 2018-07-13 DIAGNOSIS — E039 Hypothyroidism, unspecified: Secondary | ICD-10-CM | POA: Diagnosis not present

## 2018-07-13 DIAGNOSIS — I10 Essential (primary) hypertension: Secondary | ICD-10-CM | POA: Diagnosis not present

## 2018-07-13 DIAGNOSIS — Z6825 Body mass index (BMI) 25.0-25.9, adult: Secondary | ICD-10-CM | POA: Diagnosis not present

## 2018-07-13 DIAGNOSIS — E1140 Type 2 diabetes mellitus with diabetic neuropathy, unspecified: Secondary | ICD-10-CM | POA: Diagnosis not present

## 2018-07-24 DIAGNOSIS — J209 Acute bronchitis, unspecified: Secondary | ICD-10-CM | POA: Diagnosis not present

## 2018-07-24 DIAGNOSIS — Z6826 Body mass index (BMI) 26.0-26.9, adult: Secondary | ICD-10-CM | POA: Diagnosis not present

## 2018-07-24 DIAGNOSIS — J0101 Acute recurrent maxillary sinusitis: Secondary | ICD-10-CM | POA: Diagnosis not present

## 2018-07-24 DIAGNOSIS — R05 Cough: Secondary | ICD-10-CM | POA: Diagnosis not present

## 2018-07-31 DIAGNOSIS — Z6826 Body mass index (BMI) 26.0-26.9, adult: Secondary | ICD-10-CM | POA: Diagnosis not present

## 2018-07-31 DIAGNOSIS — R05 Cough: Secondary | ICD-10-CM | POA: Diagnosis not present

## 2018-07-31 DIAGNOSIS — J209 Acute bronchitis, unspecified: Secondary | ICD-10-CM | POA: Diagnosis not present

## 2018-07-31 DIAGNOSIS — R6883 Chills (without fever): Secondary | ICD-10-CM | POA: Diagnosis not present

## 2018-07-31 DIAGNOSIS — J0101 Acute recurrent maxillary sinusitis: Secondary | ICD-10-CM | POA: Diagnosis not present

## 2018-08-18 DIAGNOSIS — R69 Illness, unspecified: Secondary | ICD-10-CM | POA: Diagnosis not present

## 2018-08-24 DIAGNOSIS — R69 Illness, unspecified: Secondary | ICD-10-CM | POA: Diagnosis not present

## 2018-08-31 DIAGNOSIS — E782 Mixed hyperlipidemia: Secondary | ICD-10-CM | POA: Diagnosis not present

## 2018-08-31 DIAGNOSIS — K219 Gastro-esophageal reflux disease without esophagitis: Secondary | ICD-10-CM | POA: Diagnosis not present

## 2018-08-31 DIAGNOSIS — I10 Essential (primary) hypertension: Secondary | ICD-10-CM | POA: Diagnosis not present

## 2018-08-31 DIAGNOSIS — R69 Illness, unspecified: Secondary | ICD-10-CM | POA: Diagnosis not present

## 2018-08-31 DIAGNOSIS — E1165 Type 2 diabetes mellitus with hyperglycemia: Secondary | ICD-10-CM | POA: Diagnosis not present

## 2018-08-31 DIAGNOSIS — G2581 Restless legs syndrome: Secondary | ICD-10-CM | POA: Diagnosis not present

## 2018-08-31 DIAGNOSIS — R102 Pelvic and perineal pain: Secondary | ICD-10-CM | POA: Diagnosis not present

## 2018-08-31 DIAGNOSIS — E039 Hypothyroidism, unspecified: Secondary | ICD-10-CM | POA: Diagnosis not present

## 2018-09-03 DIAGNOSIS — E1140 Type 2 diabetes mellitus with diabetic neuropathy, unspecified: Secondary | ICD-10-CM | POA: Diagnosis not present

## 2018-09-03 DIAGNOSIS — Z6825 Body mass index (BMI) 25.0-25.9, adult: Secondary | ICD-10-CM | POA: Diagnosis not present

## 2018-09-03 DIAGNOSIS — I10 Essential (primary) hypertension: Secondary | ICD-10-CM | POA: Diagnosis not present

## 2018-09-03 DIAGNOSIS — E039 Hypothyroidism, unspecified: Secondary | ICD-10-CM | POA: Diagnosis not present

## 2018-09-03 DIAGNOSIS — R69 Illness, unspecified: Secondary | ICD-10-CM | POA: Diagnosis not present

## 2018-09-03 DIAGNOSIS — K219 Gastro-esophageal reflux disease without esophagitis: Secondary | ICD-10-CM | POA: Diagnosis not present

## 2018-09-14 DIAGNOSIS — K219 Gastro-esophageal reflux disease without esophagitis: Secondary | ICD-10-CM | POA: Diagnosis not present

## 2018-09-14 DIAGNOSIS — R69 Illness, unspecified: Secondary | ICD-10-CM | POA: Diagnosis not present

## 2018-09-14 DIAGNOSIS — E1140 Type 2 diabetes mellitus with diabetic neuropathy, unspecified: Secondary | ICD-10-CM | POA: Diagnosis not present

## 2018-09-14 DIAGNOSIS — E039 Hypothyroidism, unspecified: Secondary | ICD-10-CM | POA: Diagnosis not present

## 2018-09-14 DIAGNOSIS — I10 Essential (primary) hypertension: Secondary | ICD-10-CM | POA: Diagnosis not present

## 2018-09-14 DIAGNOSIS — Z6826 Body mass index (BMI) 26.0-26.9, adult: Secondary | ICD-10-CM | POA: Diagnosis not present

## 2018-10-23 DIAGNOSIS — M1711 Unilateral primary osteoarthritis, right knee: Secondary | ICD-10-CM | POA: Diagnosis not present

## 2018-10-23 DIAGNOSIS — Z96642 Presence of left artificial hip joint: Secondary | ICD-10-CM | POA: Diagnosis not present

## 2018-12-25 ENCOUNTER — Other Ambulatory Visit: Payer: Self-pay | Admitting: Family Medicine

## 2018-12-25 DIAGNOSIS — R1011 Right upper quadrant pain: Secondary | ICD-10-CM

## 2018-12-25 DIAGNOSIS — R160 Hepatomegaly, not elsewhere classified: Secondary | ICD-10-CM

## 2018-12-29 ENCOUNTER — Ambulatory Visit (HOSPITAL_COMMUNITY)
Admission: RE | Admit: 2018-12-29 | Discharge: 2018-12-29 | Disposition: A | Discharge status: Home / Self Care | Payer: Medicare HMO | Source: Ambulatory Visit | Attending: Family Medicine | Admitting: Family Medicine

## 2018-12-29 ENCOUNTER — Other Ambulatory Visit: Payer: Self-pay

## 2018-12-29 DIAGNOSIS — R1011 Right upper quadrant pain: Secondary | ICD-10-CM | POA: Diagnosis present

## 2018-12-29 DIAGNOSIS — R160 Hepatomegaly, not elsewhere classified: Secondary | ICD-10-CM

## 2019-06-03 IMAGING — DX DG CHEST 2V
2 series · 2 of 2 positions shown · non-contrast
Comparison: Chest x-ray of October 24, 2016

CLINICAL DATA: Chest congestion, abdominal pain. History of colonic
malignancy.

EXAM:
CHEST - 2 VIEW

[chest pa]
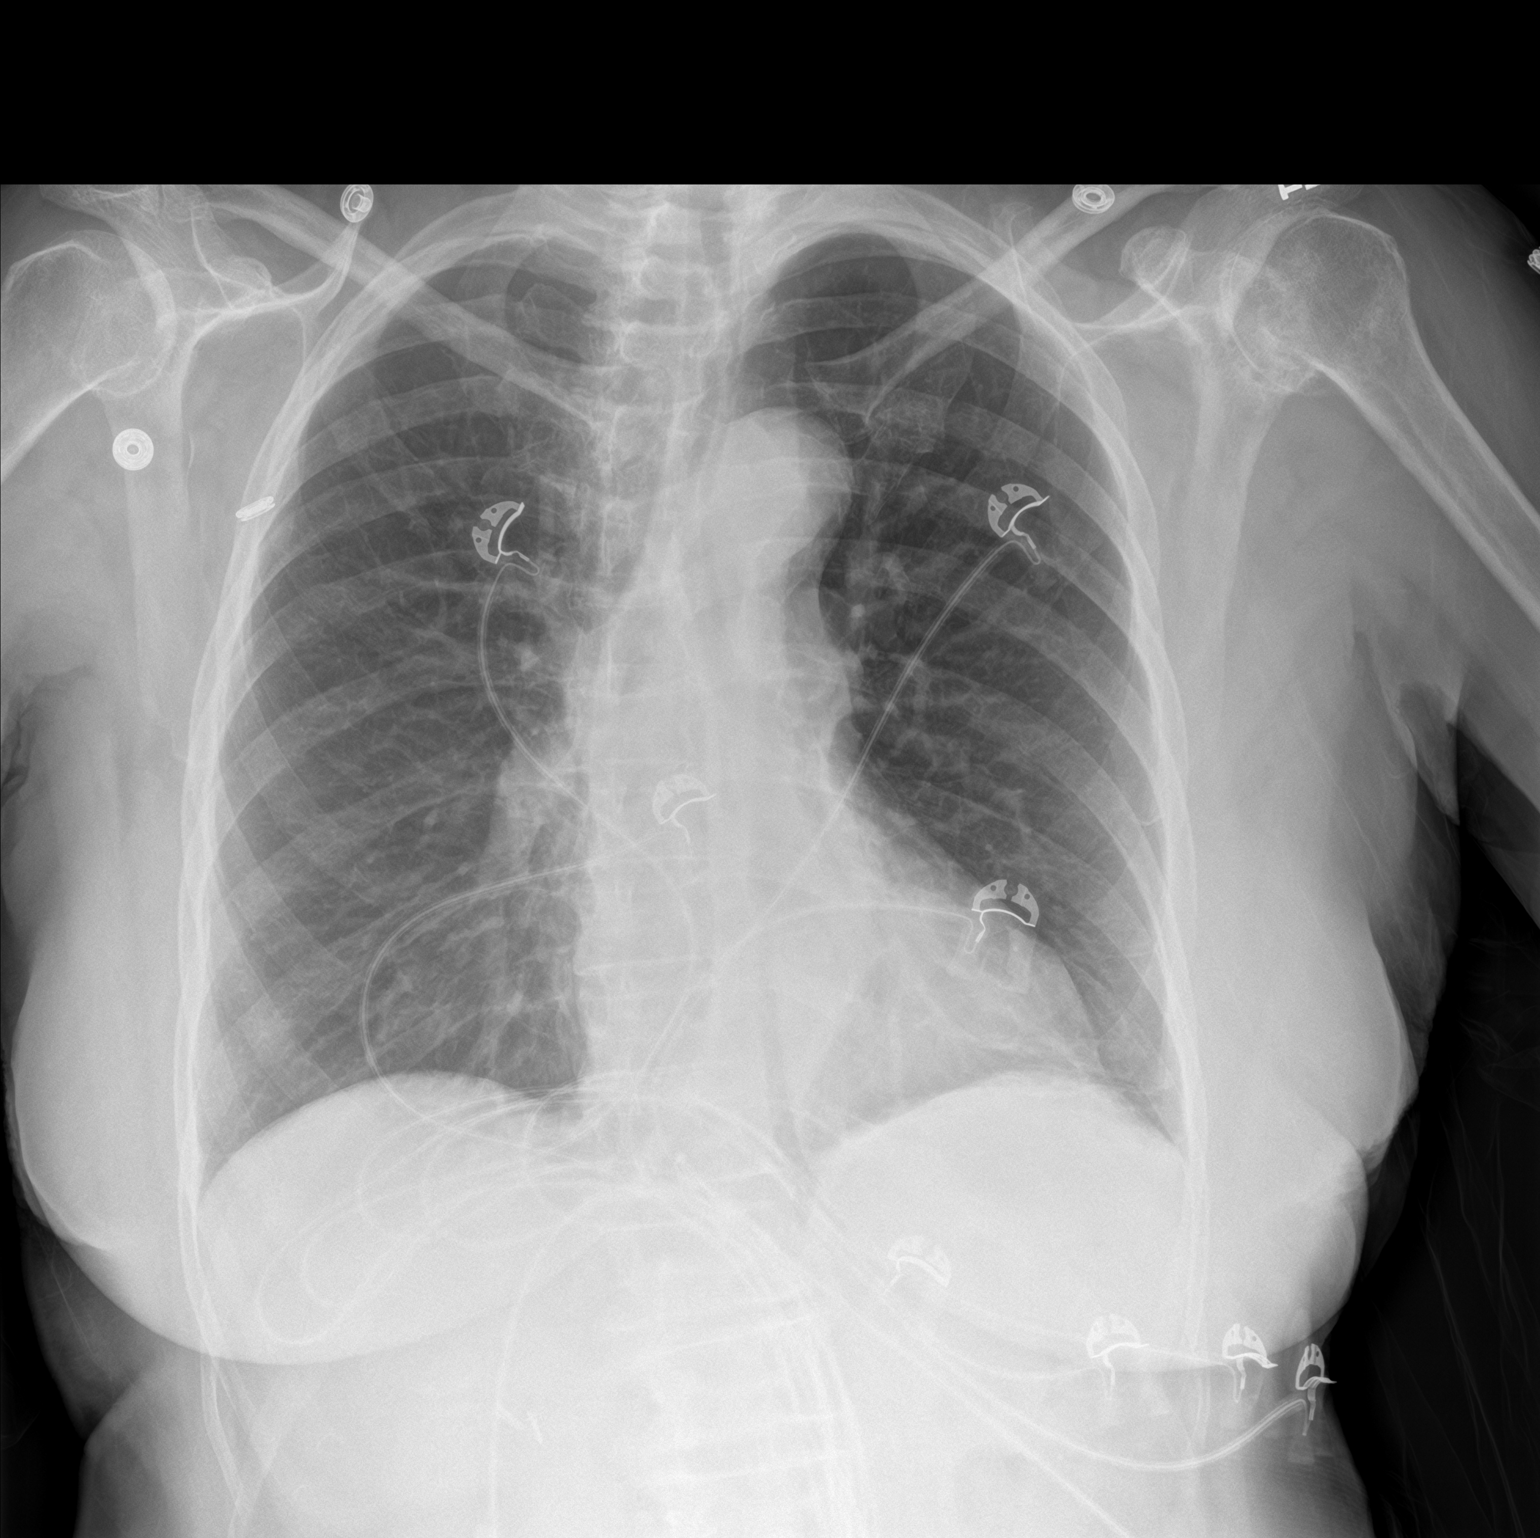

[chest lat]
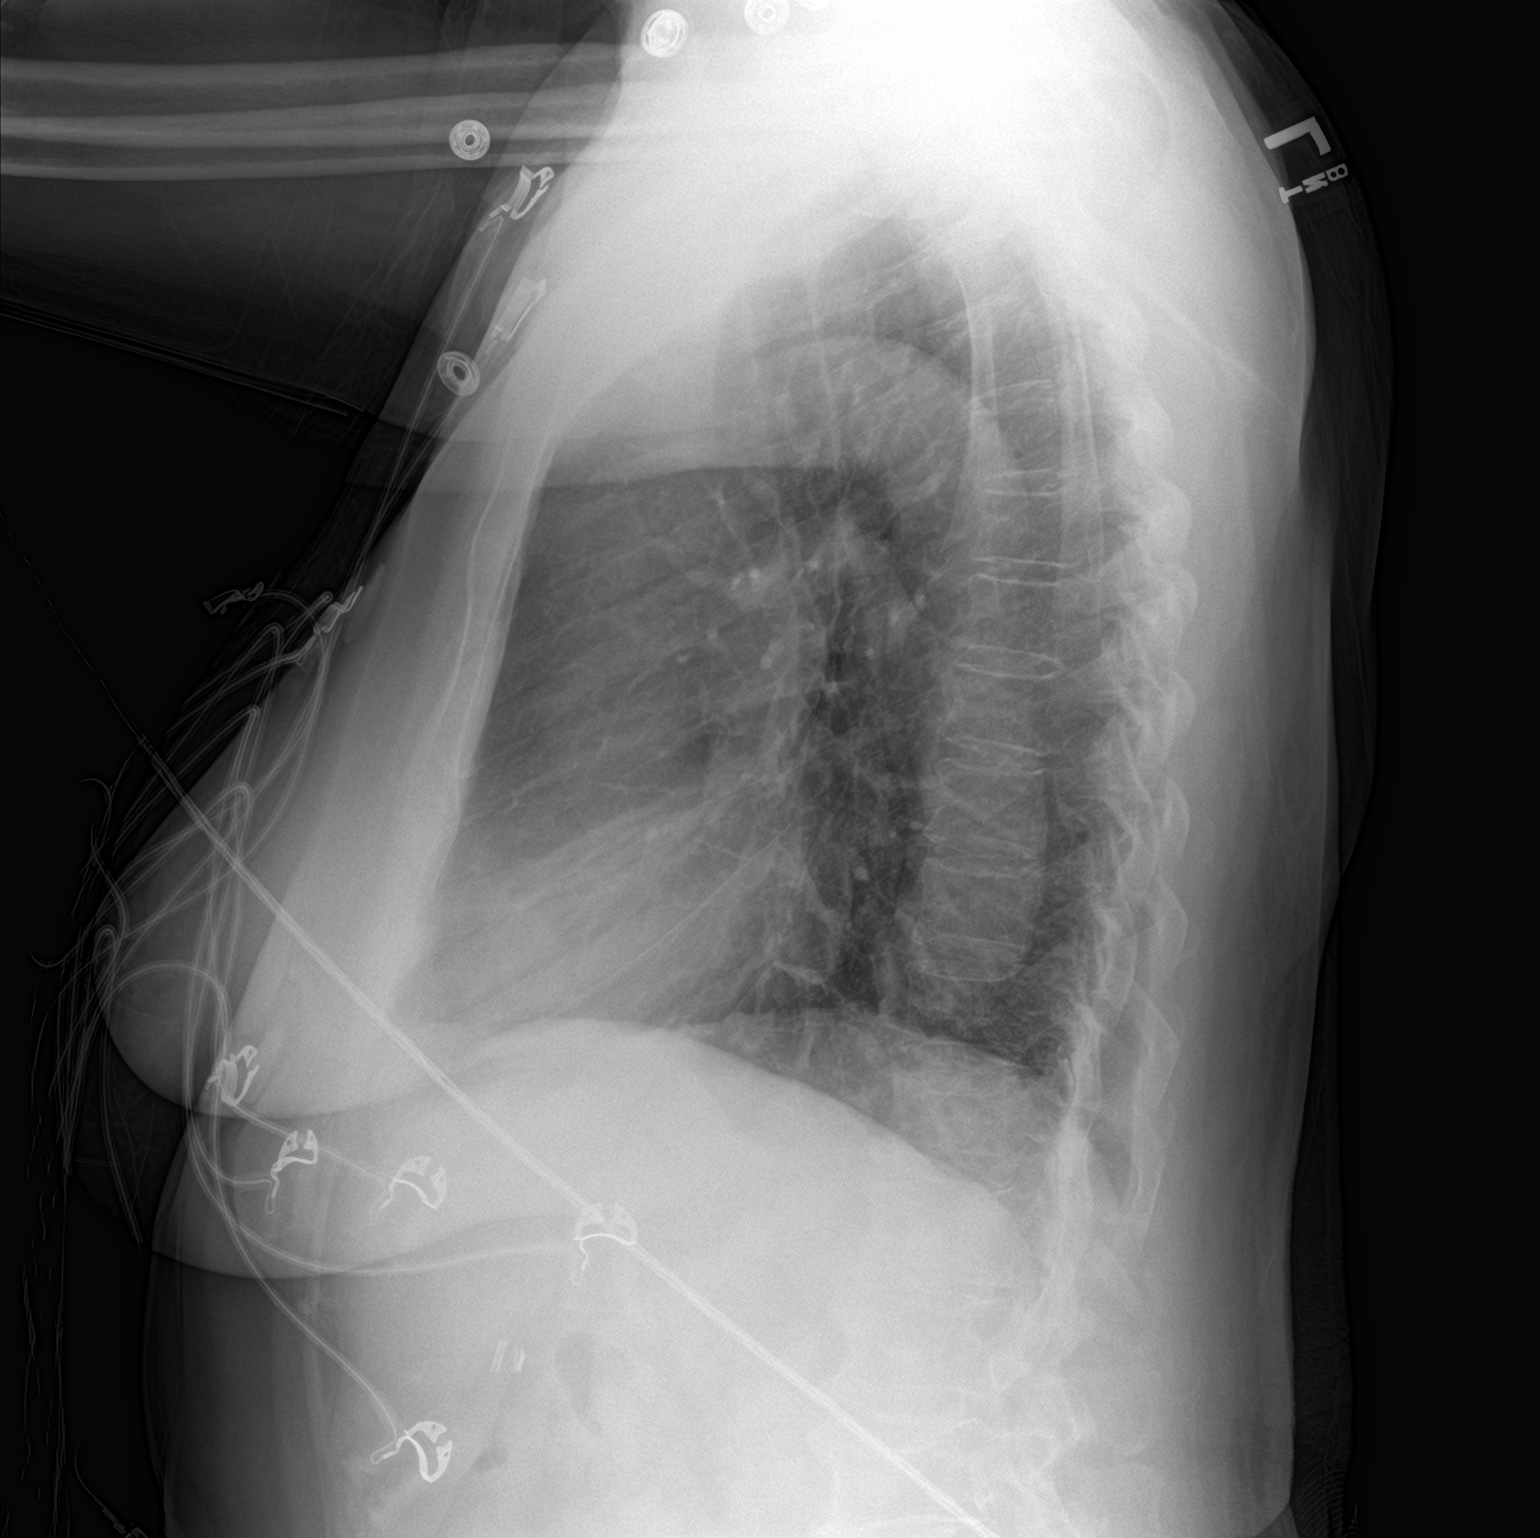

[2 of 2 positions shown; findings below may reference images not displayed]

FINDINGS: The lungs are well-expanded. There is no focal infiltrate. The lung
markings are coarse at the left base which is a chronic finding.
There is no pleural effusion. The heart and pulmonary vascularity
are normal. The mediastinum is normal in width. The bony thorax
exhibits no acute abnormality.
IMPRESSION: Mild chronic bronchitic changes, stable. No acute cardiopulmonary
abnormality.

## 2019-06-03 IMAGING — CT CT NECK W/O CM
4 series · 15 of 33 positions shown, 18 images · non-contrast
Comparison: Cervical spine CT 05/23/2017

CLINICAL DATA: Difficulty swallowing since swallowing a fishbone on
09/20/2017.

EXAM:
CT NECK WITHOUT CONTRAST
TECHNIQUE: Multidetector CT imaging of the neck was performed following the
standard protocol without intravenous contrast.

[Series 3: axial neck · axial · 0.53mm/px · z∈[-161,-13]mm · 5 of 112 slices shown, 7 images]
[im 19/112  soft-tissue]
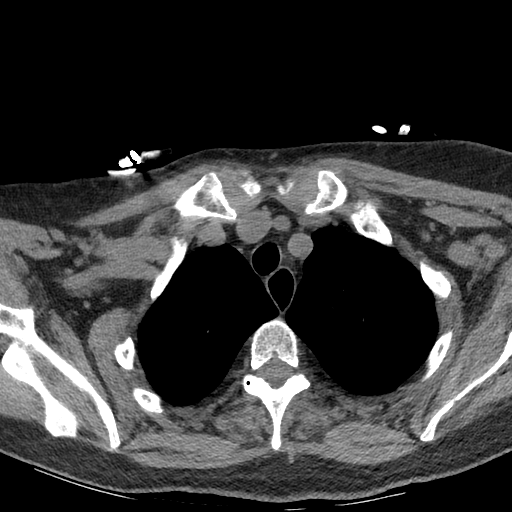
[im 19/112  bone]
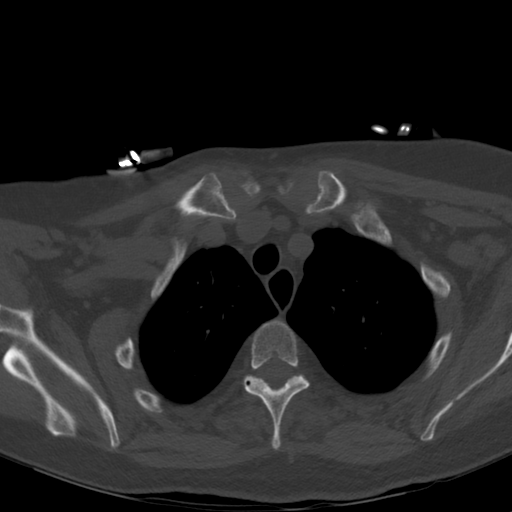
[im 38/112  bone]
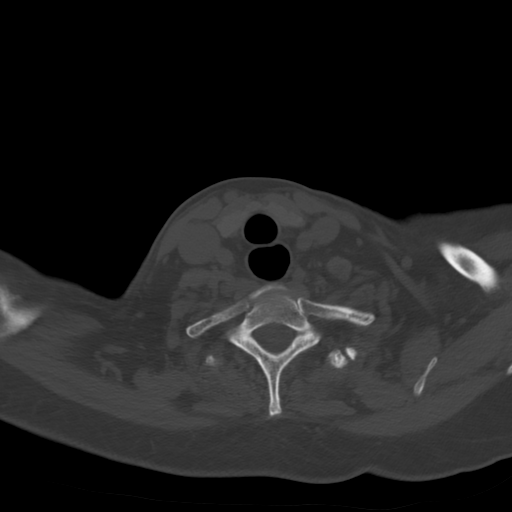
[im 56/112  bone]
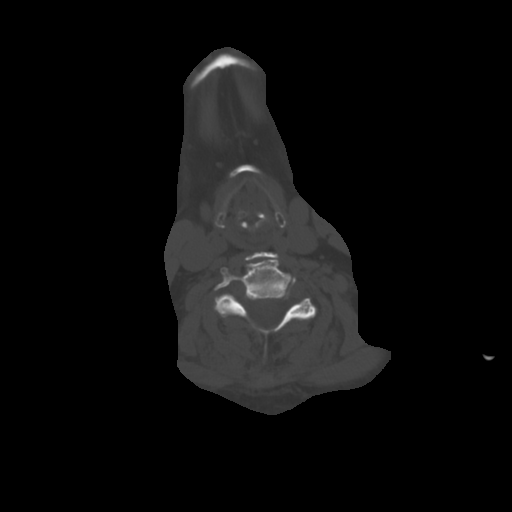
[im 75/112  bone]
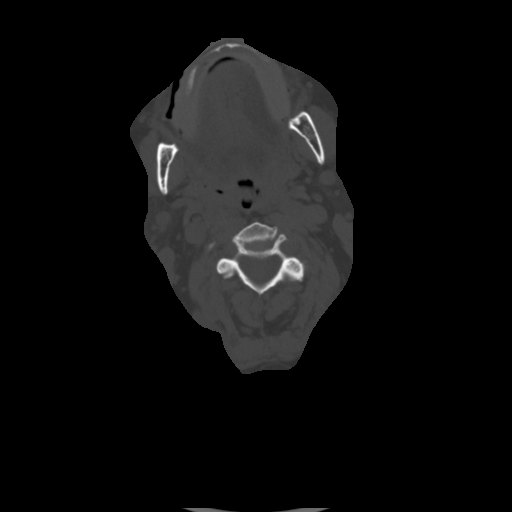
[im 93/112  soft-tissue]
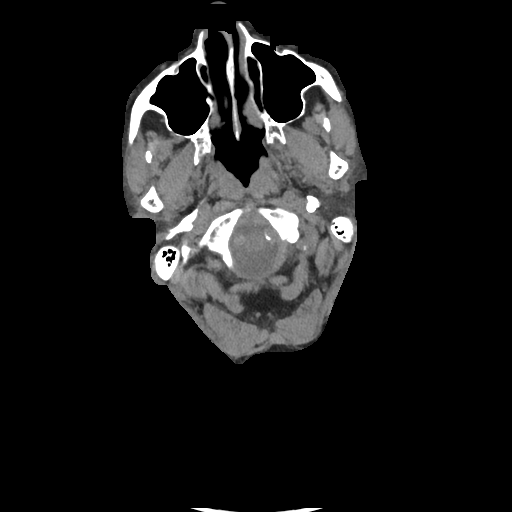
[im 93/112  bone]
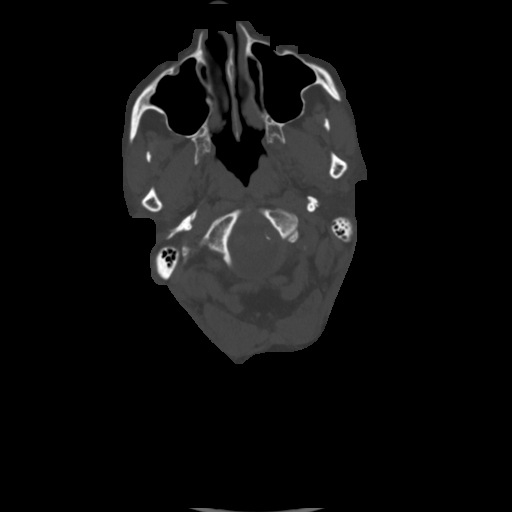

[Series 7: coronal neck · coronal · 0.44mm/px · 3 of 115 slices shown]
[im 23/115  bone]
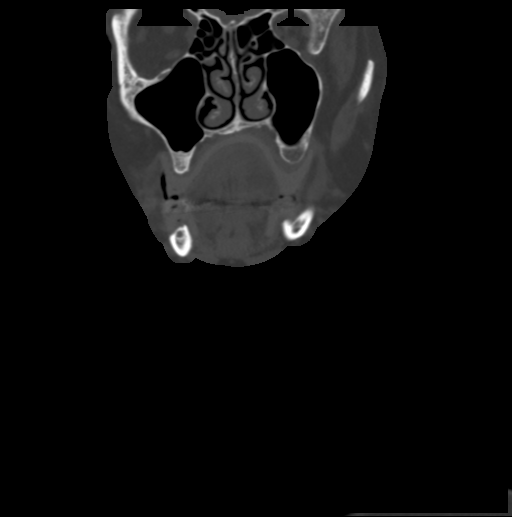
[im 46/115  bone]
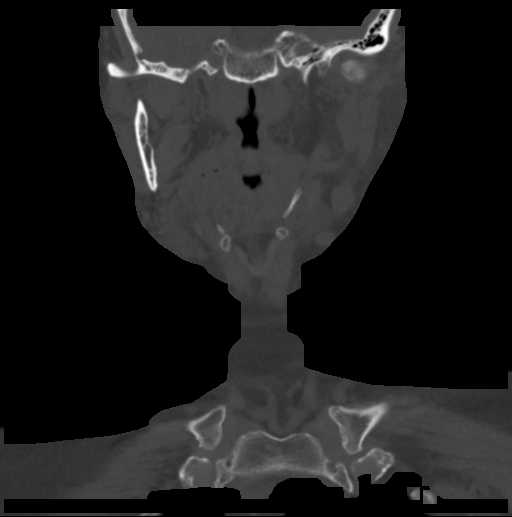
[im 69/115  bone]
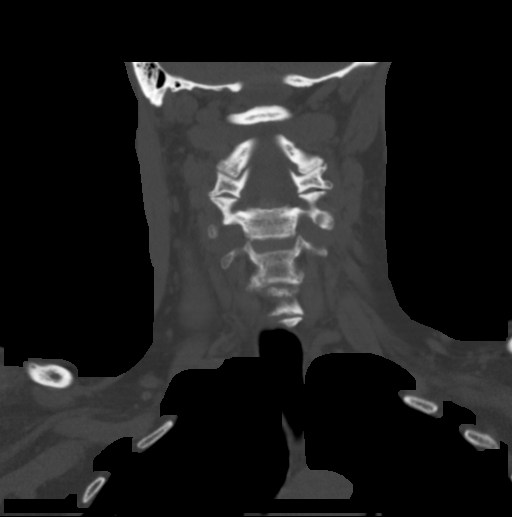

[Series 8: sagittal neck · sagittal · 0.44mm/px · 5 of 101 slices shown, 6 images]
[im 34/101  bone]
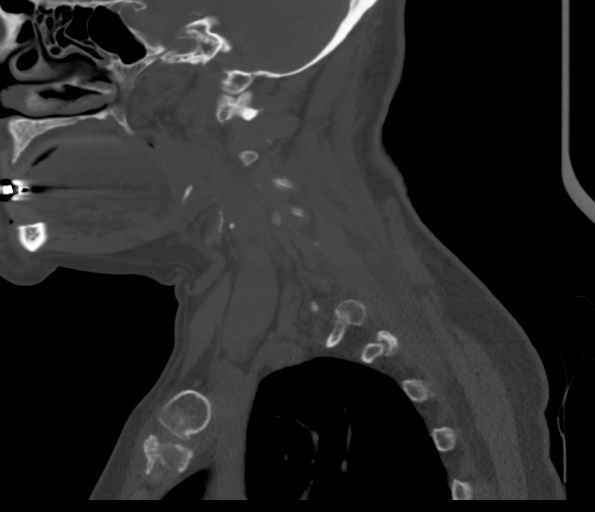
[im 42/101  bone]
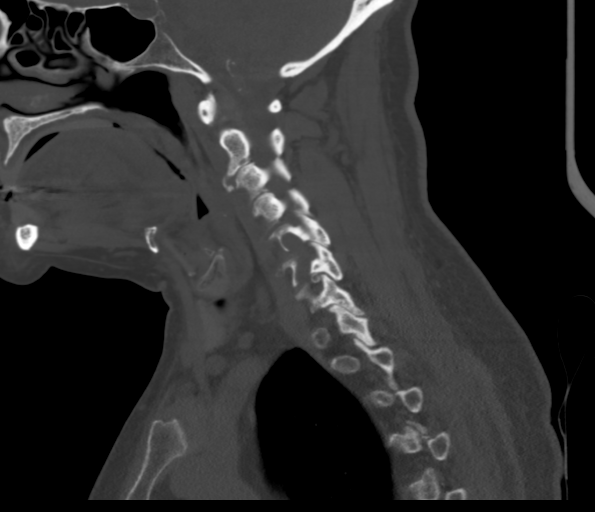
[im 51/101  soft-tissue]
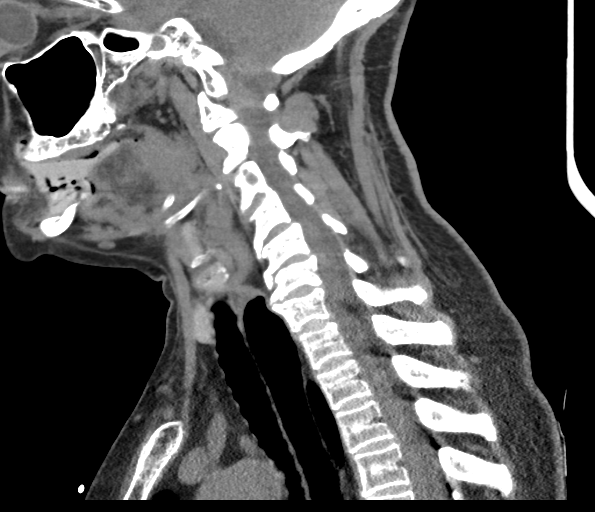
[im 51/101  bone]
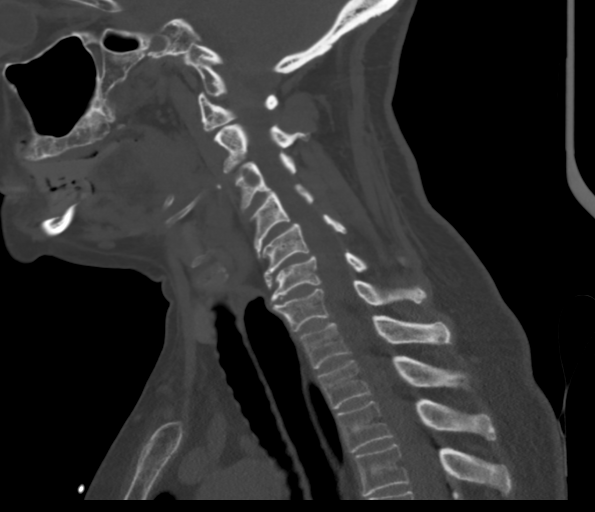
[im 59/101  bone]
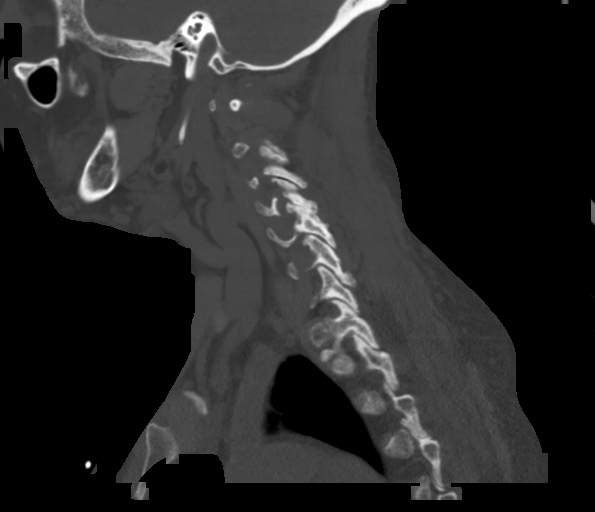
[im 67/101  bone]
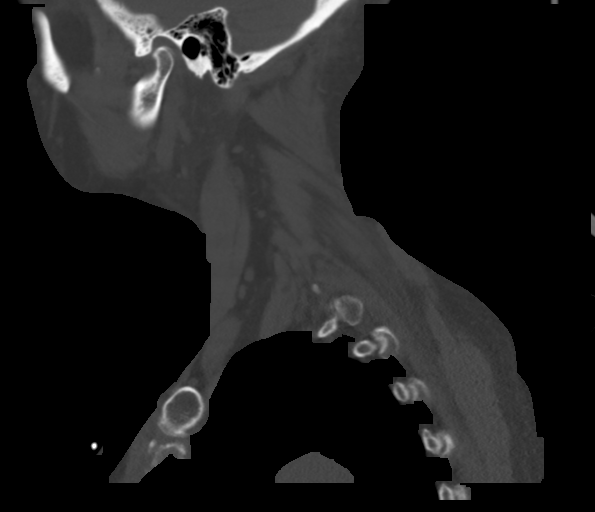

[Series 9: orthogonal ax · axial · 0.39mm/px · z∈[-162,-124]mm · 2 of 113 slices shown]
[im 19/113  bone]
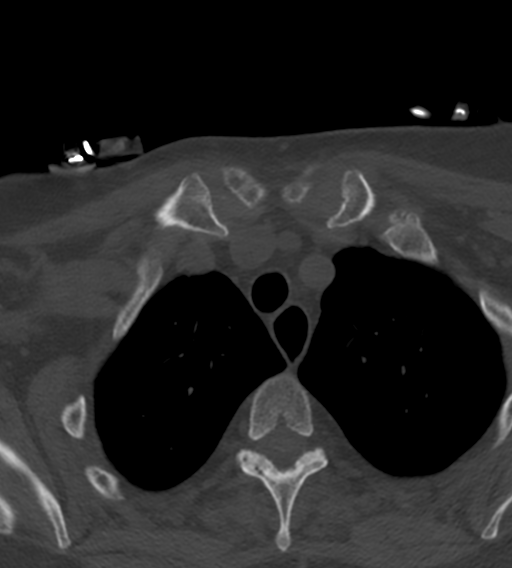
[im 38/113  bone]
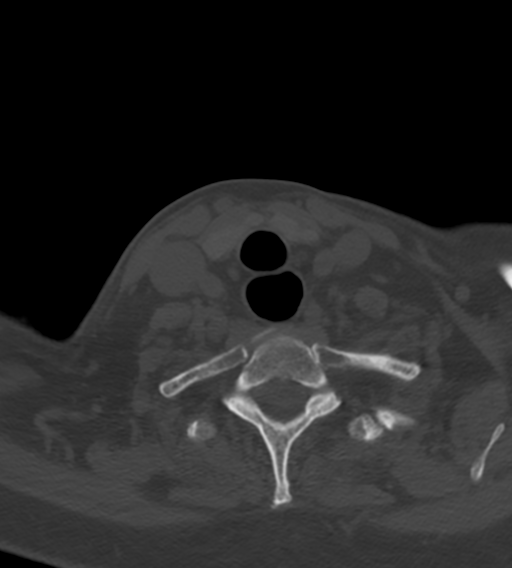

[15 of 33 positions shown; findings below may reference images not displayed]

FINDINGS: Pharynx and larynx: Unchanged pharyngeal soft tissue contours
compared to the prior CT without evidence of foreign body or mass on
this unenhanced study. No fluid collection or significant
inflammatory changes in the parapharyngeal or retropharyngeal
spaces.

Salivary glands: No inflammation, mass, or stone.

Thyroid: Unremarkable.

Lymph nodes: No enlarged or suspicious lymph nodes in the neck.

Vascular: Retropharyngeal course of the right internal carotid
artery with associated impression on the posterior or pharyngeal
wall. Mild calcified atherosclerosis at the right carotid
bifurcation.

Limited intracranial: Unremarkable.

Visualized orbits: Unremarkable.

Mastoids and visualized paranasal sinuses: Clear.

Skeleton: The patient is edentulous. No acute osseous abnormality or
suspicious osseous lesion is identified. Mild cervical disc
degeneration is noted as well as advanced left C4-5 facet arthrosis.

Upper chest: Clear lung apices.

Other: None.
IMPRESSION: No foreign body or other acute abnormality identified in the neck.

## 2020-01-31 LAB — IFOBT (OCCULT BLOOD): IFOBT: POSITIVE

## 2020-07-06 ENCOUNTER — Encounter (INDEPENDENT_AMBULATORY_CARE_PROVIDER_SITE_OTHER): Payer: Self-pay

## 2020-07-06 ENCOUNTER — Telehealth (INDEPENDENT_AMBULATORY_CARE_PROVIDER_SITE_OTHER): Payer: Self-pay

## 2020-07-06 ENCOUNTER — Encounter (INDEPENDENT_AMBULATORY_CARE_PROVIDER_SITE_OTHER): Payer: Self-pay | Admitting: Gastroenterology

## 2020-07-06 ENCOUNTER — Other Ambulatory Visit (INDEPENDENT_AMBULATORY_CARE_PROVIDER_SITE_OTHER): Payer: Self-pay

## 2020-07-06 ENCOUNTER — Ambulatory Visit (INDEPENDENT_AMBULATORY_CARE_PROVIDER_SITE_OTHER): Payer: Medicare HMO | Admitting: Gastroenterology

## 2020-07-06 ENCOUNTER — Other Ambulatory Visit: Payer: Self-pay

## 2020-07-06 DIAGNOSIS — K625 Hemorrhage of anus and rectum: Secondary | ICD-10-CM | POA: Insufficient documentation

## 2020-07-06 DIAGNOSIS — K921 Melena: Secondary | ICD-10-CM | POA: Insufficient documentation

## 2020-07-06 DIAGNOSIS — K581 Irritable bowel syndrome with constipation: Secondary | ICD-10-CM | POA: Diagnosis not present

## 2020-07-06 DIAGNOSIS — Z1211 Encounter for screening for malignant neoplasm of colon: Secondary | ICD-10-CM

## 2020-07-06 DIAGNOSIS — K589 Irritable bowel syndrome without diarrhea: Secondary | ICD-10-CM | POA: Insufficient documentation

## 2020-07-06 MED ORDER — PEG 3350-KCL-NA BICARB-NACL 420 G PO SOLR: 4000.0000 mL | ORAL | 0 refills | Status: DC

## 2020-07-06 MED ORDER — LUBIPROSTONE 8 MCG PO CAPS: 8.0000 ug | ORAL_CAPSULE | Freq: Two times a day (BID) | ORAL | 3 refills | Status: DC

## 2020-07-06 NOTE — Telephone Encounter (Signed)
Traci Clark, CMA  

## 2020-07-06 NOTE — Progress Notes (Signed)
[name], M.D. Gastroenterology & Hepatology [facility] [address] [place], [place] [address]  Primary Care Physician: Lavella Lemons, [place] [address]  I will communicate my assessment and recommendations to the referring MD via EMR.  Problems: 1. IBS-C 2. ?gastrointestinal bleeding  History of Present Illness: Shalea Tomczak is a 87 y.o. female with PMH colon cancer s/p resection, diabetes, DVT, GERD,  gout, myocardial infarction, anxiety, who presents for follow up of constipation, melena and hematochezia.  Patient reports that she has had a longstanding history of constipation. For this she is taking 1 capful of Miralax, occasionally 2 capfuls, but she has to take a laxative (Dulcolax) every 2-3 weeks. She states that for the last 5 years she presents intermittent episodes of "achy abdominal pain" that is located throughout her abdomen but does not radiate. She reports that she has some relief of her pain when she moves her bowels. States that twice a week she feels nauseated. Patient reports that she was on Linzess previously 145 mcg but could not tolerate it as ti made "her body feel weird". Also was too expensive for her.  Patient reports that in December 2021 she presented a new onset intermittent episodes of bright blood in her stool, total of three times with most recent 2 weeks ago. These episodes have been self limited and usually after she takes a laxative. A month ago she also endorsed having some black stools after a few days but this has not recurred.  Reports there was a short time she was not eating too much but now she is eater better.  The patient denies having any nausea, vomiting, fever, hematochezia, melena, hematemesis, abdominal distention, diarrhea, jaundice, pruritus or significant weight loss.  The patient had a positive Hemosure test on 01/31/2020.  Most recent tests are  from 09/30/2017 which showed a CMP with normal aminotransferases AST 22, ALT 17, decreased albumin 3.2, normal alkaline phosphatase 77 and total bilirubin increased 1.4, normal electrolytes, creatinine 1.1 and BUN 7, CBC with although cell count 5.4, hemoglobin 12.3 and platelets 176.  Last EGD: 09/26/2017, web found in the proximal esophagus, grade B esophagitis between 30 to 35 cm, mild stenosis between 37 to 38 cm with a diameter of 12 mm, this was dilated with a Maloney dilator up to 10 [place], 2 cm hiatal hernia, few sessile polyps in the fundus and gastric body. Last Colonoscopy: 12/13/2016, patent end-to-side colocolonic anastomosis which was healthy, small polyp at the splenic flexure  Past Medical History: Past Medical History:  Diagnosis Date  . Anxiety   . Arthritis   . Chronic constipation   . Colon cancer ([place])   . Complication of anesthesia   . Diabetes mellitus without complication ([place])   . DVT (deep vein thrombosis) in pregnancy   . Dysphagia   . GERD (gastroesophageal reflux disease)   . Glaucoma   . Gout   . H/O hiatal hernia   . History of kidney stones   . MI (myocardial infarction) ([place]) 1993  . PONV (postoperative nausea and vomiting)   . Restless leg syndrome   . Vertigo     Past Surgical History: Past Surgical History:  Procedure Laterality Date  . CHOLECYSTECTOMY    . COLECTOMY  2003  . COLONOSCOPY N/A 07/29/2012   Procedure: COLONOSCOPY;  Surgeon: Rogene Houston, MD;  [facility];  Service: Endoscopy;  Laterality: N/A;  100  . COLONOSCOPY  WITH PROPOFOL N/A 12/13/2016   Procedure: COLONOSCOPY WITH PROPOFOL;  Surgeon: Rogene Houston, MD;  [facility];  Service: Endoscopy;  Laterality: N/A;  8:30  . ESOPHAGEAL DILATION  11/03/2015   Procedure: ESOPHAGEAL DILATION;  Surgeon: Rogene Houston, MD;  [facility];  Service: Endoscopy;;  . ESOPHAGEAL DILATION N/A 09/26/2017   Procedure: ESOPHAGEAL DILATION;  Surgeon: Rogene Houston, MD;  [facility];  Service: Endoscopy;  Laterality: N/A;  . ESOPHAGOGASTRODUODENOSCOPY N/A 03/25/2014   Procedure: ESOPHAGOGASTRODUODENOSCOPY (EGD);  Surgeon: Rogene Houston, MD;  [facility];  Service: Endoscopy;  Laterality: N/A;  1055  . ESOPHAGOGASTRODUODENOSCOPY N/A 11/03/2015   Procedure: ESOPHAGOGASTRODUODENOSCOPY (EGD);  Surgeon: Rogene Houston, MD;  [facility];  Service: Endoscopy;  Laterality: N/A;  1:55 - moved to 11:15 - Ann to notify - moved to 12:45 - pt knows to arrive at 11:45  . ESOPHAGOGASTRODUODENOSCOPY N/A 09/26/2017   Procedure: ESOPHAGOGASTRODUODENOSCOPY (EGD);  Surgeon: Rogene Houston, MD;  [facility];  Service: Endoscopy;  Laterality: N/A;  . MALONEY DILATION N/A 03/25/2014   Procedure: Venia Minks DILATION;  Surgeon: Rogene Houston, MD;  [facility];  Service: Endoscopy;  Laterality: N/A;  . POLYPECTOMY  12/13/2016   Procedure: POLYPECTOMY;  Surgeon: Rogene Houston, MD;  [facility];  Service: Endoscopy;;  polyp  . TUBAL LIGATION    . VEIN SURGERY Bilateral    stripped vein due to DVT    Family History:History reviewed. No pertinent family history.  Social History: Social History   Tobacco Use  Smoking Status Never Smoker  Smokeless Tobacco Never Used   Social History   Substance and Sexual Activity  Alcohol Use No  . Alcohol/week: 0.0 standard drinks   Social History   Substance and Sexual Activity  Drug Use No    Allergies: Allergies  Allergen Reactions  . Amoxicillin Anaphylaxis and Rash    "Skin peeled off"  . Macrobid [Nitrofurantoin Monohyd Macro] Anaphylaxis, Rash and Other (See Comments)    "Skin peeled off"  . Codeine Nausea And Vomiting and Other (See Comments)    shaking  . Darvon [Propoxyphene] Nausea And Vomiting and Other (See Comments)    shaking    Medications: Current Outpatient Medications  Medication Sig Dispense Refill  . acetaminophen  (TYLENOL) 325 MG tablet Take 650 mg by mouth every 6 (six) hours as needed for pain.    Marland Kitchen allopurinol (ZYLOPRIM) 300 MG tablet Take 300 mg by mouth daily.    Marland Kitchen ALPRAZolam (XANAX) 0.5 MG tablet Take 1 tablet (0.5 mg total) by mouth 3 (three) times daily as needed.    Marland Kitchen amLODipine (NORVASC) 5 MG tablet Take 5 mg by mouth daily.    Marland Kitchen aspirin 81 MG tablet Take 1 tablet (81 mg total) by mouth daily. 30 tablet   . colchicine 0.6 MG tablet Take 0.6 mg by mouth as needed.     . fluticasone (VERAMYST) 27.5 MCG/SPRAY nasal spray Place 2 sprays into the nose daily.    . furosemide (LASIX) 20 MG tablet Take 20 mg by mouth daily.    . hydrochlorothiazide (HYDRODIURIL) 25 MG tablet Take 25 mg by mouth daily.    . hydrocortisone (ANUSOL-HC) 25 MG suppository Place 25 mg rectally 2 (two) times daily as needed for hemorrhoids or anal itching.    . Insulin Glargine (BASAGLAR KWIKPEN [place]) Inject 12 Units into the skin at bedtime.    Marland Kitchen  insulin NPH Human (NOVOLIN N) 100 UNIT/ML injection Inject 4 Units into the skin.    Marland Kitchen meclizine (ANTIVERT) 25 MG tablet Take 25 mg by mouth 3 (three) times daily as needed for dizziness.     . mirtazapine (REMERON) 15 MG tablet Take 15 mg by mouth at bedtime.    . Multiple Vitamins-Minerals (HAIR SKIN AND NAILS FORMULA) TABS Take 1 tablet by mouth every morning.     . ondansetron (ZOFRAN-ODT) 8 MG disintegrating tablet Take 1 tablet by mouth every 6 (six) hours as needed for nausea or vomiting.   0  . polyethylene glycol (MIRALAX / GLYCOLAX) packet Take 17 g by mouth daily.    . potassium chloride (KLOR-CON) 10 MEQ tablet Take 10 mEq by mouth daily.    . traMADol (ULTRAM) 50 MG tablet Take 50 mg by mouth every 6 (six) hours as needed.    . Travoprost, BAK Free, (TRAVATAN) 0.004 % SOLN ophthalmic solution Place 1 drop into both eyes at bedtime.    . pantoprazole (PROTONIX) 40 MG tablet Take 1 tablet (40 mg total) by mouth 2 (two) times daily before a meal. 60 tablet 1   No current  facility-administered medications for this visit.    Review of Systems: GENERAL: negative for malaise, night sweats HEENT: No changes in hearing or vision, no nose bleeds or other nasal problems. NECK: Negative for lumps, goiter, pain and significant neck swelling RESPIRATORY: Negative for cough, wheezing CARDIOVASCULAR: Negative for chest pain, leg swelling, palpitations, orthopnea GI: SEE HPI MUSCULOSKELETAL: Negative for joint pain or swelling, back pain, and muscle pain. SKIN: Negative for lesions, rash PSYCH: Negative for sleep disturbance, mood disorder and recent psychosocial stressors. HEMATOLOGY Negative for prolonged bleeding, bruising easily, and swollen nodes. ENDOCRINE: Negative for cold or heat intolerance, polyuria, polydipsia and goiter. NEURO: negative for tremor, gait imbalance, syncope and seizures. The remainder of the review of systems is noncontributory.   Physical Exam: BP 123/68 (BP Location: Left Arm, Patient Position: Sitting, Cuff Size: Large)   Pulse 80   Temp 98.2 F (36.8 C) (Oral)   Ht 5\' 6"  (1.676 m)   Wt 159 lb (72.1 kg)   BMI 25.66 kg/m  GENERAL: The patient is AO x3, in no acute distress. HEENT: Head is normocephalic and atraumatic. EOMI are intact. Mouth is well hydrated and without lesions. NECK: Supple. No masses LUNGS: Clear to auscultation. No presence of rhonchi/wheezing/rales. Adequate chest expansion HEART: RRR, normal s1 and s2. ABDOMEN: Soft, nontender, no guarding, no peritoneal signs, and nondistended. BS +. No masses. EXTREMITIES: Without any cyanosis, clubbing, rash, lesions or edema. NEUROLOGIC: AOx3, no focal motor deficit. SKIN: no jaundice, no rashes  Imaging/Labs: as above  I personally reviewed and interpreted the available labs, imaging and endoscopic files.  Impression and Plan: Clarine Elrod is a 87 y.o. female with PMH colon cancer s/p resection, diabetes, DVT, GERD,  gout, myocardial infarction, anxiety, who  presents for follow up of constipation, melena and hematochezia. The patient has presented chronic constipation that has not improved with the intake of OTC stool softeners or laxatives. She still has some room to increase her Miralax up to 3 times per day, but I suspect she may need a stronger medication. Amitiza may be a good option but insurance coverage may be a problem. Will recommend to start Amitiza 8 mg BID but if not possible to afford, then can do Miralx TID. Also, given her positive FOBT and episodes of gastrointestinal bleeding, will order an  EGD and colonoscopy to assess this further.  -Schedule EGD and colonoscopy -Start taking Miralax 1 cup every 8 hours -Start taking Amitiza 8 mcg every 12 hours, stop Miralax intake once you start this medication  All questions were answered.      Harvel Quale, MD Gastroenterology and Hepatology [facility]

## 2020-07-06 NOTE — Patient Instructions (Signed)
Schedule EGD and colonoscopy Start taking Miralax 1 cup every 8 hours Start taking Amitiza 8 mcg every 12 hours, stop Miralax intake once you start this medication

## 2020-07-06 NOTE — H&P (View-Only) (Signed)
Traci Clark, M.D. Gastroenterology & Hepatology [facility] [address] [place], [place] [address]  Primary Care Physician: Lavella Lemons, [place] [id number]  I will communicate my assessment and recommendations to the referring MD via EMR.  Problems: 1. IBS-C 2. ?gastrointestinal bleeding  History of Present Illness: Traci Clark is a 87 y.o. female with PMH colon cancer s/p resection, diabetes, DVT, GERD,  gout, myocardial infarction, anxiety, who presents for follow up of constipation, melena and hematochezia.  Patient reports that she has had a longstanding history of constipation. For this she is taking 1 capful of Miralax, occasionally 2 capfuls, but she has to take a laxative (Dulcolax) every 2-3 weeks. She states that for the last 5 years she presents intermittent episodes of "achy abdominal pain" that is located throughout her abdomen but does not radiate. She reports that she has some relief of her pain when she moves her bowels. States that twice a week she feels nauseated. Patient reports that she was on Linzess previously 145 mcg but could not tolerate it as ti made "her body feel weird". Also was too expensive for her.  Patient reports that in December 2021 she presented a new onset intermittent episodes of bright blood in her stool, total of three times with most recent 2 weeks ago. These episodes have been self limited and usually after she takes a laxative. A month ago she also endorsed having some black stools after a few days but this has not recurred.  Reports there was a short time she was not eating too much but now she is eater better.  The patient denies having any nausea, vomiting, fever, hematochezia, melena, hematemesis, abdominal distention, diarrhea, jaundice, pruritus or significant weight loss.  The patient had a positive Hemosure test on 01/31/2020.  Most recent tests are  from 09/30/2017 which showed a CMP with normal aminotransferases AST 22, ALT 17, decreased albumin 3.2, normal alkaline phosphatase 77 and total bilirubin increased 1.4, normal electrolytes, creatinine 1.1 and BUN 7, CBC with although cell count 5.4, hemoglobin 12.3 and platelets 176.  Last EGD: 09/26/2017, web found in the proximal esophagus, grade B esophagitis between 30 to 35 cm, mild stenosis between 37 to 38 cm with a diameter of 12 mm, this was dilated with a Maloney dilator up to 63 [place], 2 cm hiatal hernia, few sessile polyps in the fundus and gastric body. Last Colonoscopy: 12/13/2016, patent end-to-side colocolonic anastomosis which was healthy, small polyp at the splenic flexure  Past Medical History: Past Medical History:  Diagnosis Date  . Anxiety   . Arthritis   . Chronic constipation   . Colon cancer ([place])   . Complication of anesthesia   . Diabetes mellitus without complication ([place])   . DVT (deep vein thrombosis) in pregnancy   . Dysphagia   . GERD (gastroesophageal reflux disease)   . Glaucoma   . Gout   . H/O hiatal hernia   . History of kidney stones   . MI (myocardial infarction) ([place]) 1993  . PONV (postoperative nausea and vomiting)   . Restless leg syndrome   . Vertigo     Past Surgical History: Past Surgical History:  Procedure Laterality Date  . CHOLECYSTECTOMY    . COLECTOMY  2003  . COLONOSCOPY N/A 07/29/2012   Procedure: COLONOSCOPY;  Surgeon: Rogene Houston, MD;  [facility];  Service: Endoscopy;  Laterality: N/A;  100  . COLONOSCOPY  WITH PROPOFOL N/A 12/13/2016   Procedure: COLONOSCOPY WITH PROPOFOL;  Surgeon: Rogene Houston, MD;  [facility];  Service: Endoscopy;  Laterality: N/A;  8:30  . ESOPHAGEAL DILATION  11/03/2015   Procedure: ESOPHAGEAL DILATION;  Surgeon: Rogene Houston, MD;  [facility];  Service: Endoscopy;;  . ESOPHAGEAL DILATION N/A 09/26/2017   Procedure: ESOPHAGEAL DILATION;  Surgeon: Rogene Houston, MD;  [facility];  Service: Endoscopy;  Laterality: N/A;  . ESOPHAGOGASTRODUODENOSCOPY N/A 03/25/2014   Procedure: ESOPHAGOGASTRODUODENOSCOPY (EGD);  Surgeon: Rogene Houston, MD;  [facility];  Service: Endoscopy;  Laterality: N/A;  1055  . ESOPHAGOGASTRODUODENOSCOPY N/A 11/03/2015   Procedure: ESOPHAGOGASTRODUODENOSCOPY (EGD);  Surgeon: Rogene Houston, MD;  [facility];  Service: Endoscopy;  Laterality: N/A;  1:55 - moved to 11:15 - Ann to notify - moved to 12:45 - pt knows to arrive at 11:45  . ESOPHAGOGASTRODUODENOSCOPY N/A 09/26/2017   Procedure: ESOPHAGOGASTRODUODENOSCOPY (EGD);  Surgeon: Rogene Houston, MD;  [facility];  Service: Endoscopy;  Laterality: N/A;  . MALONEY DILATION N/A 03/25/2014   Procedure: Venia Minks DILATION;  Surgeon: Rogene Houston, MD;  [facility];  Service: Endoscopy;  Laterality: N/A;  . POLYPECTOMY  12/13/2016   Procedure: POLYPECTOMY;  Surgeon: Rogene Houston, MD;  [facility];  Service: Endoscopy;;  polyp  . TUBAL LIGATION    . VEIN SURGERY Bilateral    stripped vein due to DVT    Family History:History reviewed. No pertinent family history.  Social History: Social History   Tobacco Use  Smoking Status Never Smoker  Smokeless Tobacco Never Used   Social History   Substance and Sexual Activity  Alcohol Use No  . Alcohol/week: 0.0 standard drinks   Social History   Substance and Sexual Activity  Drug Use No    Allergies: Allergies  Allergen Reactions  . Amoxicillin Anaphylaxis and Rash    "Skin peeled off"  . Macrobid [Nitrofurantoin Monohyd Macro] Anaphylaxis, Rash and Other (See Comments)    "Skin peeled off"  . Codeine Nausea And Vomiting and Other (See Comments)    shaking  . Darvon [Propoxyphene] Nausea And Vomiting and Other (See Comments)    shaking    Medications: Current Outpatient Medications  Medication Sig Dispense Refill  . acetaminophen  (TYLENOL) 325 MG tablet Take 650 mg by mouth every 6 (six) hours as needed for pain.    Marland Kitchen allopurinol (ZYLOPRIM) 300 MG tablet Take 300 mg by mouth daily.    Marland Kitchen ALPRAZolam (XANAX) 0.5 MG tablet Take 1 tablet (0.5 mg total) by mouth 3 (three) times daily as needed.    Marland Kitchen amLODipine (NORVASC) 5 MG tablet Take 5 mg by mouth daily.    Marland Kitchen aspirin 81 MG tablet Take 1 tablet (81 mg total) by mouth daily. 30 tablet   . colchicine 0.6 MG tablet Take 0.6 mg by mouth as needed.     . fluticasone (VERAMYST) 27.5 MCG/SPRAY nasal spray Place 2 sprays into the nose daily.    . furosemide (LASIX) 20 MG tablet Take 20 mg by mouth daily.    . hydrochlorothiazide (HYDRODIURIL) 25 MG tablet Take 25 mg by mouth daily.    . hydrocortisone (ANUSOL-HC) 25 MG suppository Place 25 mg rectally 2 (two) times daily as needed for hemorrhoids or anal itching.    . Insulin Glargine (BASAGLAR KWIKPEN [place]) Inject 12 Units into the skin at bedtime.    Marland Kitchen  insulin NPH Human (NOVOLIN N) 100 UNIT/ML injection Inject 4 Units into the skin.    Marland Kitchen meclizine (ANTIVERT) 25 MG tablet Take 25 mg by mouth 3 (three) times daily as needed for dizziness.     . mirtazapine (REMERON) 15 MG tablet Take 15 mg by mouth at bedtime.    . Multiple Vitamins-Minerals (HAIR SKIN AND NAILS FORMULA) TABS Take 1 tablet by mouth every morning.     . ondansetron (ZOFRAN-ODT) 8 MG disintegrating tablet Take 1 tablet by mouth every 6 (six) hours as needed for nausea or vomiting.   0  . polyethylene glycol (MIRALAX / GLYCOLAX) packet Take 17 g by mouth daily.    . potassium chloride (KLOR-CON) 10 MEQ tablet Take 10 mEq by mouth daily.    . traMADol (ULTRAM) 50 MG tablet Take 50 mg by mouth every 6 (six) hours as needed.    . Travoprost, BAK Free, (TRAVATAN) 0.004 % SOLN ophthalmic solution Place 1 drop into both eyes at bedtime.    . pantoprazole (PROTONIX) 40 MG tablet Take 1 tablet (40 mg total) by mouth 2 (two) times daily before a meal. 60 tablet 1   No current  facility-administered medications for this visit.    Review of Systems: GENERAL: negative for malaise, night sweats HEENT: No changes in hearing or vision, no nose bleeds or other nasal problems. NECK: Negative for lumps, goiter, pain and significant neck swelling RESPIRATORY: Negative for cough, wheezing CARDIOVASCULAR: Negative for chest pain, leg swelling, palpitations, orthopnea GI: SEE HPI MUSCULOSKELETAL: Negative for joint pain or swelling, back pain, and muscle pain. SKIN: Negative for lesions, rash PSYCH: Negative for sleep disturbance, mood disorder and recent psychosocial stressors. HEMATOLOGY Negative for prolonged bleeding, bruising easily, and swollen nodes. ENDOCRINE: Negative for cold or heat intolerance, polyuria, polydipsia and goiter. NEURO: negative for tremor, gait imbalance, syncope and seizures. The remainder of the review of systems is noncontributory.   Physical Exam: BP 123/68 (BP Location: Left Arm, Patient Position: Sitting, Cuff Size: Large)   Pulse 80   Temp 98.2 F (36.8 C) (Oral)   Ht 5\' 6"  (1.676 m)   Wt 159 lb (72.1 kg)   BMI 25.66 kg/m  GENERAL: The patient is AO x3, in no acute distress. HEENT: Head is normocephalic and atraumatic. EOMI are intact. Mouth is well hydrated and without lesions. NECK: Supple. No masses LUNGS: Clear to auscultation. No presence of rhonchi/wheezing/rales. Adequate chest expansion HEART: RRR, normal s1 and s2. ABDOMEN: Soft, nontender, no guarding, no peritoneal signs, and nondistended. BS +. No masses. EXTREMITIES: Without any cyanosis, clubbing, rash, lesions or edema. NEUROLOGIC: AOx3, no focal motor deficit. SKIN: no jaundice, no rashes  Imaging/Labs: as above  I personally reviewed and interpreted the available labs, imaging and endoscopic files.  Impression and Plan: Traci Clark is a 87 y.o. female with PMH colon cancer s/p resection, diabetes, DVT, GERD,  gout, myocardial infarction, anxiety, who  presents for follow up of constipation, melena and hematochezia. The patient has presented chronic constipation that has not improved with the intake of OTC stool softeners or laxatives. She still has some room to increase her Miralax up to 3 times per day, but I suspect she may need a stronger medication. Amitiza may be a good option but insurance coverage may be a problem. Will recommend to start Amitiza 8 mg BID but if not possible to afford, then can do Miralx TID. Also, given her positive FOBT and episodes of gastrointestinal bleeding, will order an  EGD and colonoscopy to assess this further.  -Schedule EGD and colonoscopy -Start taking Miralax 1 cup every 8 hours -Start taking Amitiza 8 mcg every 12 hours, stop Miralax intake once you start this medication  All questions were answered.      Harvel Quale, MD Gastroenterology and Hepatology [facility]

## 2020-07-18 NOTE — Patient Instructions (Signed)
Alphia Behanna Saggese  07/18/2020     @PREFPERIOPPHARMACY @   Your procedure is scheduled on  07/21/2020.   Report to [facility] at  0800  A.M.   Call this number if you have problems the morning of surgery:  [phone]   Remember:  Follow the diet and prep instructions given to you by the office.                      Take these medicines the morning of surgery with A SIP OF WATER    Allopurinol, Xanax (if needed), amlodipine, antivert(if needed), zofran (if needed), protonix, tramadol (if needed).   Take 5 units of your galrgine the night before your procedure.   DO NOT take any medications for diabetes the morning of your procedure.   If your glucose is 70 or below the morning of your procedure, drink 1/2 cup of clear juice and recheck your glucose in 15 minutes. If it is still 70 or below, call [phone] for instructions.  If your glucose is 300 or above the morning of your procedure, call [phone] for instructions.     Please brush your teeth.    Do not wear jewelry, make-up or nail polish.   Do not wear lotions, powders, or perfumes, or deodorant.  Do not shave 48 hours prior to surgery.  Men may shave face and neck.  Do not bring valuables to the hospital.  [facility] is not responsible for any belongings or valuables.  Contacts, dentures or bridgework may not be worn into surgery.  Leave your suitcase in the car.  After surgery it may be brought to your room.  For patients admitted to the hospital, discharge time will be determined by your treatment team.  Patients discharged the day of surgery will not be allowed to drive home and must have someone with them for 24 hours.   Special instructions:  DO NOT smoke tobacco or vape the morning of your procedure.  Please read over the following fact sheets that you were given. Anesthesia Post-op Instructions and Care and Recovery After Surgery       Upper Endoscopy, Adult, Care After This sheet  gives you information about how to care for yourself after your procedure. Your health care provider may also give you more specific instructions. If you have problems or questions, contact your health care provider. What can I expect after the procedure? After the procedure, it is common to have:  A sore throat.  Mild stomach pain or discomfort.  Bloating.  Nausea. Follow these instructions at home:  Follow instructions from your health care provider about what to eat or drink after your procedure.  Return to your normal activities as told by your health care provider. Ask your health care provider what activities are safe for you.  Take over-the-counter and prescription medicines only as told by your health care provider.  If you were given a sedative during the procedure, it can affect you for several hours. Do not drive or operate machinery until your health care provider says that it is safe.  Keep all follow-up visits as told by your health care provider. This is important.   Contact a health care provider if you have:  A sore throat that lasts longer than one day.  Trouble swallowing. Get help right away if:  You vomit blood or your vomit looks like coffee grounds.  You have: ? A fever. ?  Bloody, black, or tarry stools. ? A severe sore throat or you cannot swallow. ? Difficulty breathing. ? Severe pain in your chest or abdomen. Summary  After the procedure, it is common to have a sore throat, mild stomach discomfort, bloating, and nausea.  If you were given a sedative during the procedure, it can affect you for several hours. Do not drive or operate machinery until your health care provider says that it is safe.  Follow instructions from your health care provider about what to eat or drink after your procedure.  Return to your normal activities as told by your health care provider. This information is not intended to replace advice given to you by your health care  provider. Make sure you discuss any questions you have with your health care provider. Document Revised: 05/04/2019 Document Reviewed: 10/06/2017 Elsevier Patient Education  2021 [employer].  Colonoscopy, Adult, Care After This sheet gives you information about how to care for yourself after your procedure. Your health care provider may also give you more specific instructions. If you have problems or questions, contact your health care provider. What can I expect after the procedure? After the procedure, it is common to have:  A small amount of blood in your stool for 24 hours after the procedure.  Some gas.  Mild cramping or bloating of your abdomen. Follow these instructions at home: Eating and drinking  Drink enough fluid to keep your urine pale yellow.  Follow instructions from your health care provider about eating or drinking restrictions.  Resume your normal diet as instructed by your health care provider. Avoid heavy or fried foods that are hard to digest.   Activity  Rest as told by your health care provider.  Avoid sitting for a long time without moving. Get up to take short walks every 1-2 hours. This is important to improve blood flow and breathing. Ask for help if you feel weak or unsteady.  Return to your normal activities as told by your health care provider. Ask your health care provider what activities are safe for you. Managing cramping and bloating  Try walking around when you have cramps or feel bloated.  Apply heat to your abdomen as told by your health care provider. Use the heat source that your health care provider recommends, such as a moist heat pack or a heating pad. ? Place a towel between your skin and the heat source. ? Leave the heat on for 20-30 minutes. ? Remove the heat if your skin turns bright red. This is especially important if you are unable to feel pain, heat, or cold. You may have a greater risk of getting burned.   General  instructions  If you were given a sedative during the procedure, it can affect you for several hours. Do not drive or operate machinery until your health care provider says that it is safe.  For the first 24 hours after the procedure: ? Do not sign important documents. ? Do not drink alcohol. ? Do your regular daily activities at a slower pace than normal. ? Eat soft foods that are easy to digest.  Take over-the-counter and prescription medicines only as told by your health care provider.  Keep all follow-up visits as told by your health care provider. This is important. Contact a health care provider if:  You have blood in your stool 2-3 days after the procedure. Get help right away if you have:  More than a small spotting of blood in your  stool.  Large blood clots in your stool.  Swelling of your abdomen.  Nausea or vomiting.  A fever.  Increasing pain in your abdomen that is not relieved with medicine. Summary  After the procedure, it is common to have a small amount of blood in your stool. You may also have mild cramping and bloating of your abdomen.  If you were given a sedative during the procedure, it can affect you for several hours. Do not drive or operate machinery until your health care provider says that it is safe.  Get help right away if you have a lot of blood in your stool, nausea or vomiting, a fever, or increased pain in your abdomen. This information is not intended to replace advice given to you by your health care provider. Make sure you discuss any questions you have with your health care provider. Document Revised: 04/30/2019 Document Reviewed: 11/30/2018 Elsevier Patient Education  2021 [place] After This sheet gives you information about how to care for yourself after your procedure. Your health care provider may also give you more specific instructions. If you have problems or questions, contact your health care  provider. What can I expect after the procedure? After the procedure, it is common to have:  Tiredness.  Forgetfulness about what happened after the procedure.  Impaired judgment for important decisions.  Nausea or vomiting.  Some difficulty with balance. Follow these instructions at home: For the time period you were told by your health care provider:  Rest as needed.  Do not participate in activities where you could fall or become injured.  Do not drive or use machinery.  Do not drink alcohol.  Do not take sleeping pills or medicines that cause drowsiness.  Do not make important decisions or sign legal documents.  Do not take care of children on your own.      Eating and drinking  Follow the diet that is recommended by your health care provider.  Drink enough fluid to keep your urine pale yellow.  If you vomit: ? Drink water, juice, or soup when you can drink without vomiting. ? Make sure you have little or no nausea before eating solid foods. General instructions  Have a responsible adult stay with you for the time you are told. It is important to have someone help care for you until you are awake and alert.  Take over-the-counter and prescription medicines only as told by your health care provider.  If you have sleep apnea, surgery and certain medicines can increase your risk for breathing problems. Follow instructions from your health care provider about wearing your sleep device: ? Anytime you are sleeping, including during daytime naps. ? While taking prescription pain medicines, sleeping medicines, or medicines that make you drowsy.  Avoid smoking.  Keep all follow-up visits as told by your health care provider. This is important. Contact a health care provider if:  You keep feeling nauseous or you keep vomiting.  You feel light-headed.  You are still sleepy or having trouble with balance after 24 hours.  You develop a rash.  You have a  fever.  You have redness or swelling around the IV site. Get help right away if:  You have trouble breathing.  You have new-onset confusion at home. Summary  For several hours after your procedure, you may feel tired. You may also be forgetful and have poor judgment.  Have a responsible adult stay with you for the time you are  told. It is important to have someone help care for you until you are awake and alert.  Rest as told. Do not drive or operate machinery. Do not drink alcohol or take sleeping pills.  Get help right away if you have trouble breathing, or if you suddenly become confused. This information is not intended to replace advice given to you by your health care provider. Make sure you discuss any questions you have with your health care provider. Document Revised: 01/20/2020 Document Reviewed: 04/08/2019 Elsevier Patient Education  2021 [employer].

## 2020-07-19 ENCOUNTER — Other Ambulatory Visit: Payer: Self-pay

## 2020-07-19 ENCOUNTER — Encounter (HOSPITAL_COMMUNITY): Payer: Self-pay

## 2020-07-19 ENCOUNTER — Other Ambulatory Visit (HOSPITAL_COMMUNITY)
Admission: RE | Admit: 2020-07-19 | Discharge: 2020-07-19 | Disposition: A | Discharge status: Home / Self Care | Payer: Medicare HMO | Source: Ambulatory Visit | Attending: Gastroenterology | Admitting: Gastroenterology

## 2020-07-19 ENCOUNTER — Encounter (HOSPITAL_COMMUNITY)
Admission: RE | Admit: 2020-07-19 | Discharge: 2020-07-19 | Disposition: A | Discharge status: Home / Self Care | Payer: Medicare HMO | Source: Ambulatory Visit | Attending: Gastroenterology | Admitting: Gastroenterology

## 2020-07-19 DIAGNOSIS — Z01818 Encounter for other preprocedural examination: Secondary | ICD-10-CM | POA: Diagnosis present

## 2020-07-19 DIAGNOSIS — Z20822 Contact with and (suspected) exposure to covid-19: Secondary | ICD-10-CM | POA: Diagnosis not present

## 2020-07-19 HISTORY — DX: Unspecified convulsions: R56.9

## 2020-07-19 LAB — CBC WITH DIFFERENTIAL/PLATELET
Abs Immature Granulocytes: 0.01 10*3/uL (ref 0.00–0.07)
Basophils Absolute: 0.1 10*3/uL (ref 0.0–0.1)
Basophils Relative: 2 %
Eosinophils Absolute: 0 10*3/uL (ref 0.0–0.5)
Eosinophils Relative: 0 %
HCT: 42.9 % (ref 36.0–46.0)
Hemoglobin: 13.7 g/dL (ref 12.0–15.0)
Immature Granulocytes: 0 %
Lymphocytes Relative: 53 %
Lymphs Abs: 3 10*3/uL (ref 0.7–4.0)
MCH: 29.1 pg (ref 26.0–34.0)
MCHC: 31.9 g/dL (ref 30.0–36.0)
MCV: 91.3 fL (ref 80.0–100.0)
Monocytes Absolute: 0.4 10*3/uL (ref 0.1–1.0)
Monocytes Relative: 7 %
Neutro Abs: 2.2 10*3/uL (ref 1.7–7.7)
Neutrophils Relative %: 38 %
Platelets: 174 10*3/uL (ref 150–400)
RBC: 4.7 MIL/uL (ref 3.87–5.11)
RDW: 13 % (ref 11.5–15.5)
WBC: 5.8 10*3/uL (ref 4.0–10.5)
nRBC: 0 % (ref 0.0–0.2)

## 2020-07-19 LAB — BASIC METABOLIC PANEL
Anion gap: 12 (ref 5–15)
BUN: 11 mg/dL (ref 8–23)
CO2: 22 mmol/L (ref 22–32)
Calcium: 9.8 mg/dL (ref 8.9–10.3)
Chloride: 104 mmol/L (ref 98–111)
Creatinine, Ser: 1.09 mg/dL — ABNORMAL HIGH (ref 0.44–1.00)
GFR, Estimated: 49 mL/min — ABNORMAL LOW (ref >60)
Glucose, Bld: 134 mg/dL — ABNORMAL HIGH (ref 70–99)
Potassium: 4.1 mmol/L (ref 3.5–5.1)
Sodium: 138 mmol/L (ref 135–145)

## 2020-07-19 LAB — SARS CORONAVIRUS 2 (TAT 6-24 HRS): SARS Coronavirus 2: NEGATIVE

## 2020-07-21 ENCOUNTER — Ambulatory Visit (HOSPITAL_COMMUNITY): Payer: Medicare HMO | Admitting: Anesthesiology

## 2020-07-21 ENCOUNTER — Encounter (INDEPENDENT_AMBULATORY_CARE_PROVIDER_SITE_OTHER): Payer: Self-pay

## 2020-07-21 ENCOUNTER — Ambulatory Visit (HOSPITAL_COMMUNITY)
Admission: RE | Admit: 2020-07-21 | Discharge: 2020-07-21 | Disposition: A | Discharge status: Home / Self Care | Payer: Medicare HMO | Attending: Gastroenterology | Admitting: Gastroenterology

## 2020-07-21 ENCOUNTER — Encounter (HOSPITAL_COMMUNITY): Payer: Self-pay | Admitting: Gastroenterology

## 2020-07-21 ENCOUNTER — Encounter (HOSPITAL_COMMUNITY)
Admission: RE | Disposition: A | Discharge status: Home / Self Care | Payer: Self-pay | Source: Home / Self Care | Attending: Gastroenterology

## 2020-07-21 DIAGNOSIS — Z881 Allergy status to other antibiotic agents status: Secondary | ICD-10-CM | POA: Insufficient documentation

## 2020-07-21 DIAGNOSIS — K2100 Gastro-esophageal reflux disease with esophagitis, without bleeding: Secondary | ICD-10-CM | POA: Insufficient documentation

## 2020-07-21 DIAGNOSIS — K921 Melena: Secondary | ICD-10-CM | POA: Diagnosis not present

## 2020-07-21 DIAGNOSIS — Z794 Long term (current) use of insulin: Secondary | ICD-10-CM | POA: Insufficient documentation

## 2020-07-21 DIAGNOSIS — Z79899 Other long term (current) drug therapy: Secondary | ICD-10-CM | POA: Insufficient documentation

## 2020-07-21 DIAGNOSIS — E119 Type 2 diabetes mellitus without complications: Secondary | ICD-10-CM | POA: Insufficient documentation

## 2020-07-21 DIAGNOSIS — I252 Old myocardial infarction: Secondary | ICD-10-CM | POA: Insufficient documentation

## 2020-07-21 DIAGNOSIS — K209 Esophagitis, unspecified without bleeding: Secondary | ICD-10-CM | POA: Diagnosis not present

## 2020-07-21 DIAGNOSIS — K581 Irritable bowel syndrome with constipation: Secondary | ICD-10-CM | POA: Diagnosis not present

## 2020-07-21 DIAGNOSIS — Z87892 Personal history of anaphylaxis: Secondary | ICD-10-CM | POA: Insufficient documentation

## 2020-07-21 DIAGNOSIS — D122 Benign neoplasm of ascending colon: Secondary | ICD-10-CM

## 2020-07-21 DIAGNOSIS — Z85038 Personal history of other malignant neoplasm of large intestine: Secondary | ICD-10-CM | POA: Diagnosis not present

## 2020-07-21 DIAGNOSIS — K317 Polyp of stomach and duodenum: Secondary | ICD-10-CM

## 2020-07-21 DIAGNOSIS — K625 Hemorrhage of anus and rectum: Secondary | ICD-10-CM | POA: Diagnosis not present

## 2020-07-21 DIAGNOSIS — Z86718 Personal history of other venous thrombosis and embolism: Secondary | ICD-10-CM | POA: Diagnosis not present

## 2020-07-21 DIAGNOSIS — Z885 Allergy status to narcotic agent status: Secondary | ICD-10-CM | POA: Insufficient documentation

## 2020-07-21 DIAGNOSIS — D124 Benign neoplasm of descending colon: Secondary | ICD-10-CM

## 2020-07-21 DIAGNOSIS — Z7982 Long term (current) use of aspirin: Secondary | ICD-10-CM | POA: Insufficient documentation

## 2020-07-21 DIAGNOSIS — K648 Other hemorrhoids: Secondary | ICD-10-CM | POA: Diagnosis not present

## 2020-07-21 DIAGNOSIS — Z980 Intestinal bypass and anastomosis status: Secondary | ICD-10-CM

## 2020-07-21 DIAGNOSIS — Z880 Allergy status to penicillin: Secondary | ICD-10-CM | POA: Insufficient documentation

## 2020-07-21 HISTORY — PX: COLONOSCOPY WITH PROPOFOL: SHX5780

## 2020-07-21 HISTORY — PX: ESOPHAGOGASTRODUODENOSCOPY (EGD) WITH PROPOFOL: SHX5813

## 2020-07-21 HISTORY — PX: POLYPECTOMY: SHX5525

## 2020-07-21 HISTORY — PX: BIOPSY: SHX5522

## 2020-07-21 LAB — GLUCOSE, CAPILLARY: Glucose-Capillary: 167 mg/dL — ABNORMAL HIGH (ref 70–99)

## 2020-07-21 SURGERY — COLONOSCOPY WITH PROPOFOL
Anesthesia: General

## 2020-07-21 MED ORDER — ONDANSETRON HCL 4 MG/2ML IJ SOLN
INTRAMUSCULAR | Status: AC
  Filled 2020-07-21: qty 2

## 2020-07-21 MED ORDER — PROPOFOL 500 MG/50ML IV EMUL
INTRAVENOUS | Status: DC | PRN
  Administered 2020-07-21: 100 ug/kg/min via INTRAVENOUS

## 2020-07-21 MED ORDER — LACTATED RINGERS IV SOLN
INTRAVENOUS | Status: DC
  Administered 2020-07-21: 1000 mL via INTRAVENOUS

## 2020-07-21 MED ORDER — ONDANSETRON HCL 4 MG/2ML IJ SOLN
4.0000 mg | Freq: Once | INTRAMUSCULAR | Status: AC
  Administered 2020-07-21: 4 mg via INTRAVENOUS

## 2020-07-21 MED ORDER — KETAMINE HCL 50 MG/5ML IJ SOSY
PREFILLED_SYRINGE | INTRAMUSCULAR | Status: AC
  Filled 2020-07-21: qty 5

## 2020-07-21 MED ORDER — PROPOFOL 10 MG/ML IV BOLUS
INTRAVENOUS | Status: AC
  Filled 2020-07-21: qty 40

## 2020-07-21 NOTE — Anesthesia Postprocedure Evaluation (Signed)
Anesthesia Post Note  Patient: Fatimata Talsma Kueker  Procedure(s) Performed: COLONOSCOPY WITH PROPOFOL (N/A ) ESOPHAGOGASTRODUODENOSCOPY (EGD) WITH PROPOFOL (N/A ) BIOPSY POLYPECTOMY  Patient location during evaluation: Phase II Anesthesia Type: General Level of consciousness: awake, oriented, awake and alert and patient cooperative Pain management: satisfactory to patient Vital Signs Assessment: post-procedure vital signs reviewed and stable Respiratory status: spontaneous breathing, respiratory function stable and nonlabored ventilation Cardiovascular status: stable Postop Assessment: no apparent nausea or vomiting Anesthetic complications: no   No complications documented.   Last Vitals:  Vitals:   07/21/20 0850  BP: (!) 159/97  Resp: 16  Temp: 36.9 C  SpO2: 96%    Last Pain:  Vitals:   07/21/20 0850  TempSrc: Oral  PainSc: 0-No pain                 Montzerrat Brunell

## 2020-07-21 NOTE — Op Note (Signed)
[facility] Patient Name: Traci Clark Procedure Date: 07/21/2020 9:31 AM MRN: [id number] Date of Birth: Mar 13, 1931 Attending MD: [name] ,  CSN: [id number] Age: 87 Admit Type: Outpatient Procedure:                Upper GI endoscopy Indications:              Melena Providers:                [name], Janeece Riggers, RN, Raphael Gibney,                            Technician Referring MD:              Medicines:                Monitored Anesthesia Care Complications:            No immediate complications. Estimated Blood Loss:     Estimated blood loss: none. Procedure:                Pre-Anesthesia Assessment:                           - Prior to the procedure, a History and Physical                            was performed, and patient medications, allergies                            and sensitivities were reviewed. The patient's                            tolerance of previous anesthesia was reviewed.                           - The risks and benefits of the procedure and the                            sedation options and risks were discussed with the                            patient. All questions were answered and informed                            consent was obtained.                           - ASA Grade Assessment: III - A patient with severe                            systemic disease.                           After obtaining informed consent, the endoscope was                            passed under direct vision. Throughout the  procedure, the patient's blood pressure, pulse, and                            oxygen saturations were monitored continuously. The                            [id number]-[id number] ([id number]) scope was introduced through the                            mouth, and advanced to the third part of duodenum.                            The upper GI endoscopy was accomplished without                            difficulty. The  patient tolerated the procedure                            well. Scope In: 9:42:57 AM Scope Out: 9:49:43 AM Total Procedure Duration: 0 hours 6 minutes 46 seconds  Findings:      LA Grade A (one or more mucosal breaks less than 5 mm, not extending       between tops of 2 mucosal folds) esophagitis with no bleeding was found       at the gastroesophageal junction.      A few 3 to 4 mm sessile polyps with no bleeding were found in the       gastric body, had fundic gland polyp appearance.      The examined duodenum was normal.      No presence of hematin or stigmata of bleeding was observed in this exam. Impression:               - LA Grade A esophagitis with no bleeding.                           - A few gastric polyps.                           - Normal examined duodenum.                           - No specimens collected. Moderate Sedation:      Per Anesthesia Care Recommendation:           - Discharge patient to home (ambulatory).                           - Resume previous diet. Procedure Code(s):        --- Professional ---                           [phone], Esophagogastroduodenoscopy, flexible,                            transoral; diagnostic, including collection of  specimen(s) by brushing or washing, when performed                            (separate procedure) Diagnosis Code(s):        --- Professional ---                           K20.90, Esophagitis, unspecified without bleeding                           K31.7, Polyp of stomach and duodenum                           K92.1, Melena (includes Hematochezia) CPT copyright 2019 American [employer]. All rights reserved. The codes documented in this report are preliminary and upon coder review may  be revised to meet current compliance requirements. [name], MD [name],  07/21/2020 9:53:46 AM This report has been signed electronically. Number of Addenda: 0

## 2020-07-21 NOTE — Interval H&P Note (Signed)
History and Physical Interval Note:  07/21/2020 9:30 AM  Traci Clark is a 87 y.o. female with PMH colon cancer s/p resection, diabetes, DVT, GERD,  gout, myocardial infarction, anxiety, who presents for f evaluation of melena and hematochezia.  Patient states that for the last days since the last time she was seen in clinic she is still presenting episodes of melena intermittently.  Has not present any more hematochezia since then.  Otherwise denies any abdominal pain, nausea, vomiting, fever, chills.  Her most recent hemoglobin was stable above 13.  BP (!) 159/97   Temp 98.5 F (36.9 C) (Oral)   Resp 16   SpO2 96%  GENERAL: The patient is AO x3, in no acute distress. Elder HEENT: Head is normocephalic and atraumatic. EOMI are intact. Mouth is well hydrated and without lesions. NECK: Supple. No masses LUNGS: Clear to auscultation. No presence of rhonchi/wheezing/rales. Adequate chest expansion HEART: RRR, normal s1 and s2. ABDOMEN: Soft, nontender, no guarding, no peritoneal signs, and nondistended. BS +. No masses. EXTREMITIES: Without any cyanosis, clubbing, rash, lesions or edema. NEUROLOGIC: AOx3, no focal motor deficit. SKIN: no jaundice, no rashes    [name]  has presented today for surgery, with the diagnosis of Melena Rectal Bleeding.  The various methods of treatment have been discussed with the patient and family. After consideration of risks, benefits and other options for treatment, the patient has consented to  Procedure(s) with comments: COLONOSCOPY WITH PROPOFOL (N/A) - AM ESOPHAGOGASTRODUODENOSCOPY (EGD) WITH PROPOFOL (N/A) as a surgical intervention.  The patient's history has been reviewed, patient examined, no change in status, stable for surgery.  I have reviewed the patient's chart and labs.  Questions were answered to the patient's satisfaction.     [name]

## 2020-07-21 NOTE — Addendum Note (Signed)
Addendum  created 07/21/20 1423 by Denese Killings, MD   Attestation recorded in Intraprocedure, [place] deleted, [place] filed

## 2020-07-21 NOTE — Transfer of Care (Signed)
Immediate Anesthesia Transfer of Care Note  Patient: Traci Clark  Procedure(s) Performed: COLONOSCOPY WITH PROPOFOL (N/A ) ESOPHAGOGASTRODUODENOSCOPY (EGD) WITH PROPOFOL (N/A ) BIOPSY POLYPECTOMY  Patient Location: PACU  Anesthesia Type:General  Level of Consciousness: awake, alert , oriented and patient cooperative  Airway & Oxygen Therapy: Patient Spontanous Breathing  Post-op Assessment: Report given to RN, Post -op Vital signs reviewed and stable and Patient moving all extremities X 4  Post vital signs: Reviewed and stable  Last Vitals:  Vitals Value Taken Time  BP    Temp    Pulse    Resp    SpO2      Last Pain:  Vitals:   07/21/20 0850  TempSrc: Oral  PainSc: 0-No pain      Patients Stated Pain Goal: 6 (39/67/28 [id number])  Complications: No complications documented.

## 2020-07-21 NOTE — Anesthesia Preprocedure Evaluation (Signed)
Anesthesia Evaluation  Patient identified by MRN, date of birth, ID band Patient awake    History of Anesthesia Complications (+) PONV and history of anesthetic complications  Airway Mallampati: II  TM Distance: >3 FB Neck ROM: Full    Dental  (+) Edentulous Upper, Edentulous Lower   Pulmonary    Pulmonary exam normal breath sounds clear to auscultation       Cardiovascular Exercise Tolerance: Good hypertension, Pt. on medications + Past MI  Normal cardiovascular exam Rhythm:Regular Rate:Normal     Neuro/Psych Seizures -, Well Controlled,  Anxiety    GI/Hepatic Neg liver ROS, hiatal hernia, GERD  Medicated and Controlled,  Endo/Other  diabetes, Well Controlled, Type 2, Oral Hypoglycemic Agents, Insulin Dependent  Renal/GU negative Renal ROS     Musculoskeletal  (+) Arthritis ,   Abdominal   Peds  Hematology   Anesthesia Other Findings   Reproductive/Obstetrics                             Anesthesia Physical Anesthesia Plan  ASA: II  Anesthesia Plan: General   Post-op Pain Management:    Induction: Intravenous  PONV Risk Score and Plan: TIVA  Airway Management Planned: Nasal Cannula and Natural Airway  Additional Equipment:   Intra-op Plan:   Post-operative Plan:   Informed Consent: I have reviewed the patients History and Physical, chart, labs and discussed the procedure including the risks, benefits and alternatives for the proposed anesthesia with the patient or authorized representative who has indicated his/her understanding and acceptance.       Plan Discussed with: CRNA and Surgeon  Anesthesia Plan Comments:         Anesthesia Quick Evaluation

## 2020-07-21 NOTE — Discharge Instructions (Signed)
You are being discharged to home.  Resume your previous diet.  We are waiting for your pathology results.  Your physician has indicated that a repeat colonoscopy is not recommended due to your current age (56 years or older) for surveillance.  Resume your laxative regimen.  PATIENT INSTRUCTIONS POST-ANESTHESIA  IMMEDIATELY FOLLOWING SURGERY:  Do not drive or operate machinery for the first twenty four hours after surgery.  Do not make any important decisions for twenty four hours after surgery or while taking narcotic pain medications or sedatives.  If you develop intractable nausea and vomiting or a severe headache please notify your doctor immediately.  FOLLOW-UP:  Please make an appointment with your surgeon as instructed. You do not need to follow up with anesthesia unless specifically instructed to do so.  WOUND CARE INSTRUCTIONS (if applicable):  Keep a dry clean dressing on the anesthesia/puncture wound site if there is drainage.  Once the wound has quit draining you may leave it open to air.  Generally you should leave the bandage intact for twenty four hours unless there is drainage.  If the epidural site drains for more than 36-48 hours please call the anesthesia department.  QUESTIONS?:  Please feel free to call your physician or the hospital operator if you have any questions, and they will be happy to assist you.      Colonoscopy, Adult, Care After This sheet gives you information about how to care for yourself after your procedure. Your health care provider may also give you more specific instructions. If you have problems or questions, contact your health care provider. What can I expect after the procedure? After the procedure, it is common to have:  A small amount of blood in your stool for 24 hours after the procedure.  Some gas.  Mild cramping or bloating of your abdomen. Follow these instructions at home: Eating and drinking  Drink enough fluid to keep your urine  pale yellow.  Follow instructions from your health care provider about eating or drinking restrictions.  Resume your normal diet as instructed by your health care provider. Avoid heavy or fried foods that are hard to digest.   Activity  Rest as told by your health care provider.  Avoid sitting for a long time without moving. Get up to take short walks every 1-2 hours. This is important to improve blood flow and breathing. Ask for help if you feel weak or unsteady.  Return to your normal activities as told by your health care provider. Ask your health care provider what activities are safe for you. Managing cramping and bloating  Try walking around when you have cramps or feel bloated.  Apply heat to your abdomen as told by your health care provider. Use the heat source that your health care provider recommends, such as a moist heat pack or a heating pad. ? Place a towel between your skin and the heat source. ? Leave the heat on for 20-30 minutes. ? Remove the heat if your skin turns bright red. This is especially important if you are unable to feel pain, heat, or cold. You may have a greater risk of getting burned.   General instructions  If you were given a sedative during the procedure, it can affect you for several hours. Do not drive or operate machinery until your health care provider says that it is safe.  For the first 24 hours after the procedure: ? Do not sign important documents. ? Do not drink alcohol. ? Do  your regular daily activities at a slower pace than normal. ? Eat soft foods that are easy to digest.  Take over-the-counter and prescription medicines only as told by your health care provider.  Keep all follow-up visits as told by your health care provider. This is important. Contact a health care provider if:  You have blood in your stool 2-3 days after the procedure. Get help right away if you have:  More than a small spotting of blood in your stool.  Large  blood clots in your stool.  Swelling of your abdomen.  Nausea or vomiting.  A fever.  Increasing pain in your abdomen that is not relieved with medicine. Summary  After the procedure, it is common to have a small amount of blood in your stool. You may also have mild cramping and bloating of your abdomen.  If you were given a sedative during the procedure, it can affect you for several hours. Do not drive or operate machinery until your health care provider says that it is safe.  Get help right away if you have a lot of blood in your stool, nausea or vomiting, a fever, or increased pain in your abdomen. This information is not intended to replace advice given to you by your health care provider. Make sure you discuss any questions you have with your health care provider. Document Revised: 04/30/2019 Document Reviewed: 11/30/2018 Elsevier Patient Education  2021 [place].    Upper Endoscopy, Adult, Care After This sheet gives you information about how to care for yourself after your procedure. Your health care provider may also give you more specific instructions. If you have problems or questions, contact your health care provider. What can I expect after the procedure? After the procedure, it is common to have:  A sore throat.  Mild stomach pain or discomfort.  Bloating.  Nausea. Follow these instructions at home:  Follow instructions from your health care provider about what to eat or drink after your procedure.  Return to your normal activities as told by your health care provider. Ask your health care provider what activities are safe for you.  Take over-the-counter and prescription medicines only as told by your health care provider.  If you were given a sedative during the procedure, it can affect you for several hours. Do not drive or operate machinery until your health care provider says that it is safe.  Keep all follow-up visits as told by your health care  provider. This is important.   Contact a health care provider if you have:  A sore throat that lasts longer than one day.  Trouble swallowing. Get help right away if:  You vomit blood or your vomit looks like coffee grounds.  You have: ? A fever. ? Bloody, black, or tarry stools. ? A severe sore throat or you cannot swallow. ? Difficulty breathing. ? Severe pain in your chest or abdomen. Summary  After the procedure, it is common to have a sore throat, mild stomach discomfort, bloating, and nausea.  If you were given a sedative during the procedure, it can affect you for several hours. Do not drive or operate machinery until your health care provider says that it is safe.  Follow instructions from your health care provider about what to eat or drink after your procedure.  Return to your normal activities as told by your health care provider. This information is not intended to replace advice given to you by your health care provider. Make sure you  discuss any questions you have with your health care provider. Document Revised: 05/04/2019 Document Reviewed: 10/06/2017 Elsevier Patient Education  2021 [employer].

## 2020-07-21 NOTE — Op Note (Signed)
[facility] Patient Name: Traci Clark Procedure Date: 07/21/2020 9:54 AM MRN: [id number] Date of Birth: 01-Feb-1931 Attending MD: [name] ,  CSN: [id number] Age: 87 Admit Type: Outpatient Procedure:                Colonoscopy Indications:              Rectal bleeding Providers:                [name], Janeece Riggers, RN, Raphael Gibney,                            Technician Referring MD:              Medicines:                Monitored Anesthesia Care Complications:            No immediate complications. Estimated Blood Loss:     Estimated blood loss: none. Procedure:                Pre-Anesthesia Assessment:                           - Prior to the procedure, a History and Physical                            was performed, and patient medications, allergies                            and sensitivities were reviewed. The patient's                            tolerance of previous anesthesia was reviewed.                           - The risks and benefits of the procedure and the                            sedation options and risks were discussed with the                            patient. All questions were answered and informed                            consent was obtained.                           - ASA Grade Assessment: III - A patient with severe                            systemic disease.                           After obtaining informed consent, the colonoscope                            was passed under direct vision. Throughout the  procedure, the patient's blood pressure, pulse, and                            oxygen saturations were monitored continuously. The                            [id number]-[id number] ([id number]) was introduced through the                            anus and advanced to the 20 cm into the ileum. The                            colonoscopy was performed without difficulty. The                            patient  tolerated the procedure well. The quality                            of the bowel preparation was good. Scope withdrawal                            time was 16 minutes. Scope In: 9:55:48 AM Scope Out: 10:26:02 AM Scope Withdrawal Time: 0 hours 21 minutes 29 seconds  Total Procedure Duration: 0 hours 30 minutes 14 seconds  Findings:      The perianal and digital rectal examinations were normal.      The distal ileum appeared normal.      There was evidence of a prior end-to-side ileo-colonic anastomosis in       the ascending colon at 70 cm from the incisors. This was patent and was       characterized by healthy appearing mucosa. The anastomosis was traversed.      A 2 mm polyp was found in the ascending colon. The polyp was sessile.       The polyp was removed with a cold biopsy forceps. Resection and       retrieval were complete.      Two sessile polyps were found in the descending colon and ascending       colon. The polyps were 4 to 5 mm in size. These polyps were removed with       a cold snare. Resection and retrieval were complete.      Non-bleeding internal hemorrhoids were found during retroflexion. The       hemorrhoids were medium-sized. Impression:               - The examined portion of the ileum was normal.                           - Patent end-to-side ileo-colonic anastomosis,                            characterized by healthy appearing mucosa.                           - One 2 mm polyp in the ascending colon, removed  with a cold biopsy forceps. Resected and retrieved.                           - Two 4 to 5 mm polyps in the descending colon and                            in the ascending colon, removed with a cold snare.                            Resected and retrieved.                           - Non-bleeding internal hemorrhoids. Moderate Sedation:      Per Anesthesia Care Recommendation:           - Discharge patient to home  (ambulatory).                           - Resume previous diet.                           - Await pathology results.                           - Repeat colonoscopy is not recommended due to                            current age (46 years or older) for surveillance.                           - Resume laxative regimen. Procedure Code(s):        --- Professional ---                           [phone], Colonoscopy, flexible; with removal of                            tumor(s), polyp(s), or other lesion(s) by snare                            technique                           45380, 69, Colonoscopy, flexible; with biopsy,                            single or multiple Diagnosis Code(s):        --- Professional ---                           K64.8, Other hemorrhoids                           Z98.0, Intestinal bypass and anastomosis status                           K63.5, Polyp of colon  K62.5, Hemorrhage of anus and rectum CPT copyright 2019 American Medical Association. All rights reserved. The codes documented in this report are preliminary and upon coder review may  be revised to meet current compliance requirements. [name], MD [name],  07/21/2020 10:34:13 AM This report has been signed electronically. Number of Addenda: 0

## 2020-07-24 LAB — SURGICAL PATHOLOGY

## 2020-07-26 ENCOUNTER — Encounter (HOSPITAL_COMMUNITY): Payer: Self-pay | Admitting: Gastroenterology

## 2020-09-11 ENCOUNTER — Observation Stay (HOSPITAL_COMMUNITY)
Admission: EM | Admit: 2020-09-11 | Discharge: 2020-09-14 | Disposition: A | Discharge status: Home / Self Care | Payer: Medicare HMO | Attending: Internal Medicine | Admitting: Internal Medicine

## 2020-09-11 ENCOUNTER — Emergency Department (HOSPITAL_COMMUNITY): Payer: Medicare HMO

## 2020-09-11 ENCOUNTER — Other Ambulatory Visit: Payer: Self-pay

## 2020-09-11 ENCOUNTER — Encounter (HOSPITAL_COMMUNITY): Payer: Self-pay | Admitting: Radiology

## 2020-09-11 DIAGNOSIS — Z7982 Long term (current) use of aspirin: Secondary | ICD-10-CM | POA: Diagnosis not present

## 2020-09-11 DIAGNOSIS — I129 Hypertensive chronic kidney disease with stage 1 through stage 4 chronic kidney disease, or unspecified chronic kidney disease: Secondary | ICD-10-CM | POA: Insufficient documentation

## 2020-09-11 DIAGNOSIS — R1030 Lower abdominal pain, unspecified: Secondary | ICD-10-CM | POA: Diagnosis present

## 2020-09-11 DIAGNOSIS — I723 Aneurysm of iliac artery: Secondary | ICD-10-CM

## 2020-09-11 DIAGNOSIS — K219 Gastro-esophageal reflux disease without esophagitis: Secondary | ICD-10-CM | POA: Diagnosis present

## 2020-09-11 DIAGNOSIS — I10 Essential (primary) hypertension: Secondary | ICD-10-CM | POA: Diagnosis present

## 2020-09-11 DIAGNOSIS — Z96642 Presence of left artificial hip joint: Secondary | ICD-10-CM | POA: Diagnosis not present

## 2020-09-11 DIAGNOSIS — Z794 Long term (current) use of insulin: Secondary | ICD-10-CM | POA: Insufficient documentation

## 2020-09-11 DIAGNOSIS — E1165 Type 2 diabetes mellitus with hyperglycemia: Secondary | ICD-10-CM

## 2020-09-11 DIAGNOSIS — Z20822 Contact with and (suspected) exposure to covid-19: Secondary | ICD-10-CM | POA: Insufficient documentation

## 2020-09-11 DIAGNOSIS — K5900 Constipation, unspecified: Secondary | ICD-10-CM

## 2020-09-11 DIAGNOSIS — R112 Nausea with vomiting, unspecified: Secondary | ICD-10-CM

## 2020-09-11 DIAGNOSIS — R0789 Other chest pain: Principal | ICD-10-CM

## 2020-09-11 DIAGNOSIS — Z85038 Personal history of other malignant neoplasm of large intestine: Secondary | ICD-10-CM | POA: Diagnosis not present

## 2020-09-11 DIAGNOSIS — N179 Acute kidney failure, unspecified: Secondary | ICD-10-CM

## 2020-09-11 DIAGNOSIS — Z79899 Other long term (current) drug therapy: Secondary | ICD-10-CM | POA: Insufficient documentation

## 2020-09-11 DIAGNOSIS — M109 Gout, unspecified: Secondary | ICD-10-CM | POA: Diagnosis present

## 2020-09-11 DIAGNOSIS — R109 Unspecified abdominal pain: Secondary | ICD-10-CM

## 2020-09-11 DIAGNOSIS — E1122 Type 2 diabetes mellitus with diabetic chronic kidney disease: Secondary | ICD-10-CM | POA: Insufficient documentation

## 2020-09-11 DIAGNOSIS — N189 Chronic kidney disease, unspecified: Secondary | ICD-10-CM | POA: Diagnosis not present

## 2020-09-11 DIAGNOSIS — R079 Chest pain, unspecified: Secondary | ICD-10-CM

## 2020-09-11 DIAGNOSIS — E119 Type 2 diabetes mellitus without complications: Secondary | ICD-10-CM

## 2020-09-11 LAB — CBC WITH DIFFERENTIAL/PLATELET
Abs Immature Granulocytes: 0.01 10*3/uL (ref 0.00–0.07)
Basophils Absolute: 0.1 10*3/uL (ref 0.0–0.1)
Basophils Relative: 1 %
Eosinophils Absolute: 0 10*3/uL (ref 0.0–0.5)
Eosinophils Relative: 0 %
HCT: 39.3 % (ref 36.0–46.0)
Hemoglobin: 13 g/dL (ref 12.0–15.0)
Immature Granulocytes: 0 %
Lymphocytes Relative: 43 %
Lymphs Abs: 2.6 10*3/uL (ref 0.7–4.0)
MCH: 29.3 pg (ref 26.0–34.0)
MCHC: 33.1 g/dL (ref 30.0–36.0)
MCV: 88.7 fL (ref 80.0–100.0)
Monocytes Absolute: 0.5 10*3/uL (ref 0.1–1.0)
Monocytes Relative: 9 %
Neutro Abs: 2.8 10*3/uL (ref 1.7–7.7)
Neutrophils Relative %: 47 %
Platelets: 193 10*3/uL (ref 150–400)
RBC: 4.43 MIL/uL (ref 3.87–5.11)
RDW: 14.1 % (ref 11.5–15.5)
WBC: 6 10*3/uL (ref 4.0–10.5)
nRBC: 0 % (ref 0.0–0.2)

## 2020-09-11 LAB — COMPREHENSIVE METABOLIC PANEL
ALT: 17 U/L (ref 0–44)
AST: 29 U/L (ref 15–41)
Albumin: 4 g/dL (ref 3.5–5.0)
Alkaline Phosphatase: 126 U/L (ref 38–126)
Anion gap: 11 (ref 5–15)
BUN: 15 mg/dL (ref 8–23)
CO2: 25 mmol/L (ref 22–32)
Calcium: 9.9 mg/dL (ref 8.9–10.3)
Chloride: 98 mmol/L (ref 98–111)
Creatinine, Ser: 1.54 mg/dL — ABNORMAL HIGH (ref 0.44–1.00)
GFR, Estimated: 32 mL/min — ABNORMAL LOW (ref >60)
Glucose, Bld: 119 mg/dL — ABNORMAL HIGH (ref 70–99)
Potassium: 4.1 mmol/L (ref 3.5–5.1)
Sodium: 134 mmol/L — ABNORMAL LOW (ref 135–145)
Total Bilirubin: 1.4 mg/dL — ABNORMAL HIGH (ref 0.3–1.2)
Total Protein: 8 g/dL (ref 6.5–8.1)

## 2020-09-11 LAB — URINALYSIS, ROUTINE W REFLEX MICROSCOPIC
Bilirubin Urine: NEGATIVE
Glucose, UA: NEGATIVE mg/dL
Hgb urine dipstick: NEGATIVE
Ketones, ur: NEGATIVE mg/dL
Leukocytes,Ua: NEGATIVE
Nitrite: NEGATIVE
Protein, ur: NEGATIVE mg/dL
Specific Gravity, Urine: 1.011 (ref 1.005–1.030)
pH: 6 (ref 5.0–8.0)

## 2020-09-11 LAB — TROPONIN I (HIGH SENSITIVITY): Troponin I (High Sensitivity): 8 ng/L (ref <18)

## 2020-09-11 LAB — CBG MONITORING, ED: Glucose-Capillary: 146 mg/dL — ABNORMAL HIGH (ref 70–99)

## 2020-09-11 LAB — LIPASE, BLOOD: Lipase: 31 U/L (ref 11–51)

## 2020-09-11 MED ORDER — IOHEXOL 300 MG/ML  SOLN
100.0000 mL | Freq: Once | INTRAMUSCULAR | Status: AC | PRN
  Administered 2020-09-11: 100 mL via INTRAVENOUS

## 2020-09-11 MED ORDER — METOCLOPRAMIDE HCL 5 MG/ML IJ SOLN
10.0000 mg | Freq: Once | INTRAMUSCULAR | Status: AC
  Administered 2020-09-11: 10 mg via INTRAVENOUS
  Filled 2020-09-11: qty 2

## 2020-09-11 MED ORDER — ACETAMINOPHEN 325 MG PO TABS
650.0000 mg | ORAL_TABLET | Freq: Once | ORAL | Status: AC
  Administered 2020-09-13: 650 mg via ORAL
  Filled 2020-09-11: qty 2

## 2020-09-11 MED ORDER — TRAMADOL HCL 50 MG PO TABS
50.0000 mg | ORAL_TABLET | Freq: Once | ORAL | Status: AC
  Administered 2020-09-11: 50 mg via ORAL
  Filled 2020-09-11: qty 1

## 2020-09-11 MED ORDER — LORAZEPAM 2 MG/ML IJ SOLN
0.5000 mg | Freq: Once | INTRAMUSCULAR | Status: AC
  Administered 2020-09-11: 0.5 mg via INTRAVENOUS
  Filled 2020-09-11: qty 1

## 2020-09-11 MED ORDER — FENTANYL CITRATE (PF) 100 MCG/2ML IJ SOLN
50.0000 ug | Freq: Once | INTRAMUSCULAR | Status: AC
Start: 2020-09-11 — End: 2020-09-11
  Administered 2020-09-11: 50 ug via INTRAVENOUS
  Filled 2020-09-11: qty 2

## 2020-09-11 MED ORDER — ONDANSETRON HCL 4 MG/2ML IJ SOLN
4.0000 mg | Freq: Once | INTRAMUSCULAR | Status: AC
  Administered 2020-09-11: 4 mg via INTRAVENOUS
  Filled 2020-09-11: qty 2

## 2020-09-11 MED ORDER — LACTATED RINGERS IV BOLUS
500.0000 mL | Freq: Once | INTRAVENOUS | Status: AC
  Administered 2020-09-11: 500 mL via INTRAVENOUS

## 2020-09-11 NOTE — ED Provider Notes (Signed)
[facility]   CSN: [id number] Arrival date & time: 09/11/20  1217     History Chief Complaint  Patient presents with  . Abdominal Pain    Traci Clark is a 87 y.o. female presented for evaluation of abdominal pain.  Patient states for several weeks, she has had issues with early satiety and decreased appetite.  She is now having lower abdominal pain.  She also reports generalized pain elsewhere, including the chest, but this does not appear to be new.  No associated nausea or vomiting.  No fevers or chills.  She reports having the urge to urinate frequently, but not much output.  She denies hematuria.  Her primary care doctor recommended she come to the ER for further evaluation.  She denies sick contacts.  She has not taken anything for her symptoms.  HPI     Past Medical History:  Diagnosis Date  . Anxiety   . Arthritis   . Chronic constipation   . Colon cancer (HCC)    colon  . Complication of anesthesia   . Diabetes mellitus without complication (HCC)   . DVT (deep vein thrombosis) in pregnancy   . Dysphagia   . GERD (gastroesophageal reflux disease)   . Glaucoma   . Gout   . H/O hiatal hernia   . History of kidney stones   . MI (myocardial infarction) (HCC) 1993  . PONV (postoperative nausea and vomiting)   . Restless leg syndrome   . Seizure (HCC)    withdrawal from xanax  . Vertigo     Patient Active Problem List   Diagnosis Date Noted  . IBS (irritable colon syndrome) 07/06/2020  . Melena 07/06/2020  . Rectal bleeding 07/06/2020  . Generalized abdominal pain   . Diabetes (HCC) 09/24/2017  . Essential hypertension 09/24/2017  . Gout 09/24/2017  . Anxiety 09/24/2017  . N&V (nausea and vomiting) 09/24/2017  . Pancreatitis 09/24/2017  . History of colon cancer 10/25/2016  . Dysphagia, pharyngoesophageal phase 06/27/2014  . GERD (gastroesophageal reflux disease) 03/23/2014  . RLS (restless legs syndrome)  03/23/2014  . Colon cancer (HCC) 07/14/2012    Past Surgical History:  Procedure Laterality Date  . BIOPSY  07/21/2020   Procedure: BIOPSY;  Surgeon: Dolores Frameastaneda Mayorga, Daniel, MD;  [facility]: [facility];  Service: Gastroenterology;;  . CHOLECYSTECTOMY    . COLECTOMY  2003  . COLONOSCOPY N/A 07/29/2012   Procedure: COLONOSCOPY;  Surgeon: Malissa HippoNajeeb U Rehman, MD;  Location: [facility];  Service: Endoscopy;  Laterality: N/A;  100  . COLONOSCOPY WITH PROPOFOL N/A 12/13/2016   Procedure: COLONOSCOPY WITH PROPOFOL;  Surgeon: Malissa Hippoehman, Najeeb U, MD;  [facility];  Service: Endoscopy;  Laterality: N/A;  8:30  . COLONOSCOPY WITH PROPOFOL N/A 07/21/2020   Procedure: COLONOSCOPY WITH PROPOFOL;  Surgeon: Dolores Frameastaneda Mayorga, Daniel, MD;  [facility];  Service: Gastroenterology;  Laterality: N/A;  AM  . ESOPHAGEAL DILATION  11/03/2015   Procedure: ESOPHAGEAL DILATION;  Surgeon: Malissa HippoNajeeb U Rehman, MD;  [facility];  Service: Endoscopy;;  . ESOPHAGEAL DILATION N/A 09/26/2017   Procedure: ESOPHAGEAL DILATION;  Surgeon: Malissa Hippoehman, Najeeb U, MD;  [facility];  Service: Endoscopy;  Laterality: N/A;  . ESOPHAGOGASTRODUODENOSCOPY N/A 03/25/2014   Procedure: ESOPHAGOGASTRODUODENOSCOPY (EGD);  Surgeon: Malissa HippoNajeeb U Rehman, MD;  [facility];  Service: Endoscopy;  Laterality: N/A;  1055  . ESOPHAGOGASTRODUODENOSCOPY N/A 11/03/2015   Procedure: ESOPHAGOGASTRODUODENOSCOPY (EGD);  Surgeon: Malissa HippoNajeeb U Rehman,  MD;  [facility];  Service: Endoscopy;  Laterality: N/A;  1:55 - moved to 11:15 - Ann to notify - moved to 12:45 - pt knows to arrive at 11:45  . ESOPHAGOGASTRODUODENOSCOPY N/A 09/26/2017   Procedure: ESOPHAGOGASTRODUODENOSCOPY (EGD);  Surgeon: Rogene Houston, MD;  [facility];  Service: Endoscopy;  Laterality: N/A;  . ESOPHAGOGASTRODUODENOSCOPY (EGD) WITH PROPOFOL N/A 07/21/2020   Procedure: ESOPHAGOGASTRODUODENOSCOPY (EGD) WITH PROPOFOL;  Surgeon:  Harvel Quale, MD;  [facility];  Service: Gastroenterology;  Laterality: N/A;  . MALONEY DILATION N/A 03/25/2014   Procedure: Venia Minks DILATION;  Surgeon: Rogene Houston, MD;  [facility];  Service: Endoscopy;  Laterality: N/A;  . POLYPECTOMY  12/13/2016   Procedure: POLYPECTOMY;  Surgeon: Rogene Houston, MD;  [facility];  Service: Endoscopy;;  polyp  . POLYPECTOMY  07/21/2020   Procedure: POLYPECTOMY;  Surgeon: Montez Morita, Quillian Quince, MD;  [facility];  Service: Gastroenterology;;  . TOTAL HIP ARTHROPLASTY Left   . TUBAL LIGATION    . VEIN SURGERY Bilateral    stripped vein due to DVT     OB History    Gravida      Para      Term      Preterm      AB      Living  7     SAB      IAB      Ectopic      Multiple      Live Births              No family history on file.  Social History   Tobacco Use  . Smoking status: Never Smoker  . Smokeless tobacco: Never Used  Vaping Use  . Vaping Use: Never used  Substance Use Topics  . Alcohol use: No    Alcohol/week: 0.0 standard drinks  . Drug use: No    Home Medications Prior to Admission medications   Medication Sig Start Date End Date Taking? Authorizing Provider  acetaminophen (TYLENOL) 325 MG tablet Take 650 mg by mouth every 6 (six) hours as needed for pain.   Yes [provider]  allopurinol (ZYLOPRIM) 300 MG tablet Take 300 mg by mouth daily.   Yes [provider]  ALPRAZolam (XANAX) 0.5 MG tablet Take 1 tablet (0.5 mg total) by mouth 3 (three) times daily as needed. Patient taking differently: Take 0.5 mg by mouth 3 (three) times daily as needed for anxiety. 10/01/17  Yes Barton Dubois, MD  amLODipine (NORVASC) 5 MG tablet Take 5 mg by mouth daily.   Yes [provider]  aspirin 81 MG tablet Take 1 tablet (81 mg total) by mouth daily. 11/04/15  Yes Rehman, Mechele Dawley, MD  BIOTIN PO Take 1 capsule by mouth daily.   Yes  [provider]  colchicine 0.6 MG tablet Take 0.6 mg by mouth daily as needed (gout).   Yes [provider]  fluticasone (VERAMYST) 27.5 MCG/SPRAY nasal spray Place 2 sprays into the nose daily.   Yes [provider]  furosemide (LASIX) 20 MG tablet Take 20 mg by mouth daily.   Yes [provider]  insulin aspart (NOVOLOG FLEXPEN RELION) 100 UNIT/ML FlexPen Inject 4-10 Units into the skin 3 (three) times daily with meals. Per sliding scale   Yes [provider]  Insulin Glargine (BASAGLAR KWIKPEN [place]) Inject 12 Units into the skin at bedtime.   Yes [provider]  mirtazapine (REMERON) 15 MG tablet Take 15 mg by mouth at bedtime.   Yes [provider]  Multiple Vitamins-Minerals (MULTIVITAMIN WITH MINERALS) tablet Take 1 tablet by mouth daily.   Yes [provider]  nystatin (MYCOSTATIN) 100000 UNIT/ML suspension Take 5 mLs by mouth 4 (four) times daily. 09/06/20  Yes [provider]  pantoprazole (PROTONIX) 40 MG tablet Take 40 mg by mouth daily.   Yes [provider]  polyethylene glycol (MIRALAX / GLYCOLAX) packet Take 17 g by mouth daily.   Yes [provider]  potassium chloride (KLOR-CON) 10 MEQ tablet Take 10 mEq by mouth daily.   Yes [provider]  traMADol (ULTRAM) 50 MG tablet Take 50 mg by mouth every 6 (six) hours as needed for severe pain.   Yes [provider]  Travoprost, BAK Free, (TRAVATAN) 0.004 % SOLN ophthalmic solution Place 1 drop into both eyes at bedtime.   Yes [provider]  lubiprostone (AMITIZA) 8 MCG capsule Take 1 capsule (8 mcg total) by mouth 2 (two) times daily with a meal. Patient not taking: Reported on 09/11/2020 07/06/20   Harvel Quale, MD  meclizine (ANTIVERT) 25 MG tablet Take 25 mg by mouth 3 (three) times daily as needed for dizziness.  Patient not taking: Reported on 09/11/2020    [provider]  ondansetron  (ZOFRAN-ODT) 8 MG disintegrating tablet Take 1 tablet by mouth every 6 (six) hours as needed for nausea or vomiting.  Patient not taking: Reported on 09/11/2020 10/18/15   [provider]    Allergies    Amoxicillin, Macrobid [nitrofurantoin monohyd macro], Codeine, and Darvon [propoxyphene]  Review of Systems   Review of Systems  Constitutional: Positive for appetite change.  Gastrointestinal: Positive for abdominal pain.  Genitourinary: Positive for dysuria.  Musculoskeletal: Positive for myalgias.  Neurological: Positive for headaches.  All other systems reviewed and are negative.   Physical Exam Updated Vital Signs BP 140/77   Pulse 69   Temp 98.6 F (37 C) (Oral)   Resp 15   Ht 5\' 6"  (1.676 m)   Wt 69.9 kg   SpO2 98%   BMI 24.86 kg/m   Physical Exam Vitals and nursing note reviewed.  Constitutional:      General: She is not in acute distress.    Appearance: She is well-developed.     Comments: Resting in the bed in no acute distress  HENT:     Head: Normocephalic and atraumatic.  Eyes:     Conjunctiva/sclera: Conjunctivae normal.     Pupils: Pupils are equal, round, and reactive to light.  Cardiovascular:     Rate and Rhythm: Normal rate and regular rhythm.     Pulses: Normal pulses.  Pulmonary:     Effort: Pulmonary effort is normal. No respiratory distress.     Breath sounds: Normal breath sounds. No wheezing.  Abdominal:     General: There is no distension.     Palpations: Abdomen is soft. There is no mass.     Tenderness: There is abdominal tenderness. There is no guarding or rebound.     Comments: Tenderness palpation of right lower quadrant abdomen.  No rigidity, guarding or distention.  Negative rebound.  Peritonitis.  No CVA tenderness  Musculoskeletal:        General: Normal range of motion.     Cervical back: Normal range of motion and neck supple.  Skin:    General: Skin is warm and dry.  Capillary Refill: Capillary refill takes less  than 2 seconds.  Neurological:     Mental Status: She is alert and oriented to person, place, and time.     ED Results / Procedures / Treatments   Labs (all labs ordered are listed, but only abnormal results are displayed) Labs Reviewed  COMPREHENSIVE METABOLIC PANEL - Abnormal; Notable for the following components:      Result Value   Sodium 134 (*)    Glucose, Bld 119 (*)    Creatinine, Ser 1.54 (*)    Total Bilirubin 1.4 (*)    GFR, Estimated 32 (*)    All other components within normal limits  CBG MONITORING, ED - Abnormal; Notable for the following components:   Glucose-Capillary 146 (*)    All other components within normal limits  URINE CULTURE  CBC WITH DIFFERENTIAL/PLATELET  LIPASE, BLOOD  URINALYSIS, ROUTINE W REFLEX MICROSCOPIC  TROPONIN I (HIGH SENSITIVITY)  TROPONIN I (HIGH SENSITIVITY)    EKG None  Radiology DG Chest 2 View  Result Date: 09/11/2020 CLINICAL DATA:  Chest pain. EXAM: CHEST - 2 VIEW COMPARISON:  July 1921. FINDINGS: The heart size and mediastinal contours are within normal limits. Tortuous aorta. No consolidation. No visible pleural effusions or pneumothorax. No acute osseous abnormality. Similar degenerative change of the left shoulder. IMPRESSION: No evidence of acute cardiopulmonary disease. Electronically Signed   By: Margaretha Sheffield MD   On: 09/11/2020 15:00    Procedures Procedures   Medications Ordered in ED Medications  acetaminophen (TYLENOL) tablet 650 mg (has no administration in time range)    ED Course  I have reviewed the triage vital signs and the nursing notes.  Pertinent labs & imaging results that were available during my care of the patient were reviewed by me and considered in my medical decision making (see chart for details).    MDM Rules/Calculators/A&P                          Patient resenting for evaluation of abdominal pain and urinary symptoms.  She is also having several weeks of early satiety.  On exam,  patient with tenderness palpation of the right lower quadrant.  She is status post appendectomy, cholecystectomy, partial colon resection after having been diagnosed with a colonic mass.  Will obtain labs, urine, CT abdomen pelvis.  Labs overall reassuring.  No leukocytosis.  Kidney function slightly worse than baseline, but not far from baseline.  Initial troponin negative.  Urine without infection or blood.  CT pending.   Pt signed out to Marval Regal, PA-C for f/u on CT. If negative, plan for reassessment and likely d/c.  Final Clinical Impression(s) / ED Diagnoses Final diagnoses:  None    Rx / DC Orders ED Discharge Orders    None       Franchot Heidelberg, PA-C 09/11/20 Sheryle Spray, MD 09/12/20 1218

## 2020-09-11 NOTE — ED Triage Notes (Signed)
Pt c/o abd pain and nausea x 1 week. Pt seen by pcp and was told to come here.

## 2020-09-11 NOTE — ED Triage Notes (Signed)
Emergency Medicine Provider Triage Evaluation Note  Traci Clark , a 87 y.o. female  was evaluated in triage.  Pt primarily complains of generalized abdominal pain radiating into the pelvis for the last week.  Pain with urination.  Vague chest pain description, but pain is not radiating to or from the abdomen.  Constipation.  Also endorses other complaints such as headache, decreased appetite, and overall feeling unwell.  Seen by her PCP and sent to the ED.  Review of Systems  Positive: Abdominal pain, chest pain, nausea, constipation, dysuria, headache Negative: Fever, neurologic deficits, syncope, dizziness, vomiting, diarrhea, hematochezia/melena, cough  Physical Exam  BP (!) 130/109 (BP Location: Right Arm)   Pulse 71   Temp 98.6 F (37 C) (Oral)   Resp 16   Ht 5\' 6"  (1.676 m)   Wt 69.9 kg   SpO2 100%   BMI 24.86 kg/m  Gen:   Awake, no distress   HEENT:  Atraumatic  Resp:  Normal effort  Cardiac:  Normal rate  Abd:   Nondistended, generally tender MSK:   Moves extremities without difficulty  Neuro:  Speech clear, grip strength equal  Medical Decision Making  Medically screening exam initiated at 2:20 PM.  Appropriate orders placed.  Traci Clark was informed that the remainder of the evaluation will be completed by another provider, this initial triage assessment does not replace that evaluation, and the importance of remaining in the ED until their evaluation is complete.  Clinical Impression   Patient with a week of several complaints.  Vital signs reassuring.         Traci Bender, PA-C 09/11/20 1432

## 2020-09-11 NOTE — ED Provider Notes (Signed)
Traci Clark is a 87 y.o. female, presenting to the ED with abdominal pain, nausea, vomiting.   HPI from [place], PA-C: "Traci Clark is a 87 y.o. female presented for evaluation of abdominal pain.  Patient states for several weeks, she has had issues with early satiety and decreased appetite.  She is now having lower abdominal pain.  She also reports generalized pain elsewhere, including the chest, but this does not appear to be new.  No associated nausea or vomiting.  No fevers or chills.  She reports having the urge to urinate frequently, but not much output.  She denies hematuria.  Her primary care doctor recommended she come to the ER for further evaluation.  She denies sick contacts.  She has not taken anything for her symptoms."  Past Medical History:  Diagnosis Date  . Anxiety   . Arthritis   . Chronic constipation   . Colon cancer ([place])    colon  . Complication of anesthesia   . Diabetes mellitus without complication ([place])   . DVT (deep vein thrombosis) in pregnancy   . Dysphagia   . GERD (gastroesophageal reflux disease)   . Glaucoma   . Gout   . H/O hiatal hernia   . History of kidney stones   . MI (myocardial infarction) ([place]) 1993  . PONV (postoperative nausea and vomiting)   . Restless leg syndrome   . Seizure ([place])    withdrawal from xanax  . Vertigo     Physical Exam  BP 140/77   Pulse 69   Temp 98.6 F (37 C) (Oral)   Resp 15   Ht 5\' 6"  (1.676 m)   Wt 69.9 kg   SpO2 98%   BMI 24.86 kg/m   Physical Exam Vitals and nursing note reviewed.  Constitutional:      General: She is not in acute distress.    Appearance: She is well-developed. She is not diaphoretic.  HENT:     Head: Normocephalic and atraumatic.     Mouth/Throat:     Mouth: Mucous membranes are moist.     Pharynx: Oropharynx is clear.  Eyes:     Conjunctiva/sclera: Conjunctivae normal.  Cardiovascular:     Rate and Rhythm: Normal rate and regular rhythm.      Pulses: Normal pulses.          Radial pulses are 2+ on the right side and 2+ on the left side.       Posterior tibial pulses are 2+ on the right side and 2+ on the left side.     Comments: Tactile temperature in the extremities appropriate and equal bilaterally. Pulmonary:     Effort: Pulmonary effort is normal. No respiratory distress.     Breath sounds: Normal breath sounds.  Abdominal:     Palpations: Abdomen is soft.     Tenderness: There is no abdominal tenderness. There is no guarding.  Musculoskeletal:     Cervical back: Neck supple.     Right lower leg: No edema.     Left lower leg: No edema.  Skin:    General: Skin is warm and dry.  Neurological:     Mental Status: She is alert.  Psychiatric:        Mood and Affect: Mood and affect normal.        Speech: Speech normal.        Behavior: Behavior normal.     ED Course/Procedures     Procedures  Abnormal Labs Reviewed  COMPREHENSIVE METABOLIC PANEL - Abnormal; Notable for the following components:      Result Value   Sodium 134 (*)    Glucose, Bld 119 (*)    Creatinine, Ser 1.54 (*)    Total Bilirubin 1.4 (*)    GFR, Estimated 32 (*)    All other components within normal limits  CBG MONITORING, ED - Abnormal; Notable for the following components:   Glucose-Capillary 146 (*)    All other components within normal limits   BUN  Date Value Ref Range Status  09/11/2020 15 8 - 23 mg/dL Final  07/19/2020 11 8 - 23 mg/dL Final  09/30/2017 7 6 - 20 mg/dL Final  09/29/2017 7 6 - 20 mg/dL Final   Creatinine, Ser  Date Value Ref Range Status  09/11/2020 1.54 (H) 0.44 - 1.00 mg/dL Final  07/19/2020 1.09 (H) 0.44 - 1.00 mg/dL Final  09/30/2017 1.11 (H) 0.44 - 1.00 mg/dL Final  09/29/2017 1.25 (H) 0.44 - 1.00 mg/dL Final     DG Chest 2 View  Result Date: 09/11/2020 CLINICAL DATA:  Chest pain. EXAM: CHEST - 2 VIEW COMPARISON:  July 1921. FINDINGS: The heart size and mediastinal contours are within normal limits.  Tortuous aorta. No consolidation. No visible pleural effusions or pneumothorax. No acute osseous abnormality. Similar degenerative change of the left shoulder. IMPRESSION: No evidence of acute cardiopulmonary disease. Electronically Signed   By: Margaretha Sheffield MD   On: 09/11/2020 15:00   CT ABDOMEN PELVIS W CONTRAST  Result Date: 09/11/2020 CLINICAL DATA:  Abdominal pain, nausea, vomiting, constipation for 1 week. EXAM: CT ABDOMEN AND PELVIS WITH CONTRAST TECHNIQUE: Multidetector CT imaging of the abdomen and pelvis was performed using the standard protocol following bolus administration of intravenous contrast. CONTRAST:  129mL OMNIPAQUE IOHEXOL 300 MG/ML  SOLN COMPARISON:  12/06/2019 FINDINGS: Lower chest: The lung bases are clear. Hepatobiliary: Diffuse fatty infiltration of the liver. Subcentimeter cysts. Gallbladder is surgically absent. No bile duct dilatation. Pancreas: Fatty replacement of the head and body of the pancreas. No acute inflammatory changes or loculated collections. Spleen: Normal in size without focal abnormality. Adrenals/Urinary Tract: No adrenal gland nodules. Multiple parenchymal cysts in the kidneys. Nephrograms are symmetrical. No hydronephrosis or hydroureter. Bladder is normal. Stomach/Bowel: Stomach, small bowel, and colon are decompressed. No abnormal distention. Partial right colectomy with ileocolonic anastomosis. Vascular/Lymphatic: Calcification of the aorta. Tortuous aorta without aneurysmal dilatation. Right iliac artery aneurysm measuring 1.8 cm diameter. Left iliac artery aneurysm measuring 1.7 cm diameter. No significant lymphadenopathy. Reproductive: Uterus and bilateral adnexa are unremarkable. Other: No free air or free fluid in the abdomen. Abdominal wall musculature appears intact. Musculoskeletal: Previous left hip arthroplasty. Degenerative changes in the spine and right hip. No destructive bone lesions. IMPRESSION: 1. No acute process demonstrated in the  abdomen or pelvis. 2. Diffuse fatty infiltration of the liver. 3. Fatty replacement of the head and body of the pancreas. 4. Bilateral iliac artery aneurysms. 5. Aortic atherosclerosis. Aortic Atherosclerosis (ICD10-I70.0). Electronically Signed   By: Lucienne Capers M.D.   On: 09/11/2020 19:38   ED ECG REPORT   Date: 09/11/2020  Rate: 53  Rhythm: Sinus rhythm  QRS Axis: left  Intervals: PR prolonged  ST/T Wave abnormalities: normal  Conduction Disutrbances:first-degree A-V block   Narrative Interpretation:   Old EKG Reviewed: unchanged  I have personally reviewed the EKG tracing and agree with the computerized printout as noted.  MDM    Clinical Course as of 09/12/20 0038  Mon Sep 11, 2020  2012 Total Bilirubin(!): 1.4 Consistent with previously noted values. [SJ]  2102 Patient vomiting.  We will try another antiemetic. [SJ]  Tue Sep 12, 2020  6761 Spoke with Dr. Josephine Cables, hospitalist. Agrees to admit the patient.  [SJ]    Clinical Course User Index [SJ] Lorayne Bender, PA-C   Patient care handoff report received from [facility], PA-C. Plan: Patient awaiting CT scan.  Patient presents with several days of nausea, vomiting, abdominal pain. Patient is nontoxic appearing, afebrile, not tachycardic, not tachypneic, not hypotensive, maintains excellent SPO2 on room air, and is in no apparent distress.   I have reviewed the patient's chart to obtain more information.   I reviewed and interpreted the patient's labs and radiological studies. Patient does have elevation in her creatinine constituting AKI. Patient continues to vomit with attempted oral intake despite antiemetics.  Patient will need to be admitted due to intractable vomiting.   Vitals:   09/11/20 1530 09/11/20 1600 09/11/20 1700 09/11/20 1800  BP: 121/67 107/74 123/74 140/77  Pulse: 86   69  Resp: 16   15  Temp:      TempSrc:      SpO2: 94%   98%  Weight:      Height:        Incidental findings of  bilateral iliac artery aneurysms.  These were discussed with the patient and her daughter as well as recommendation for follow-up with vascular surgery.      Lorayne Bender, PA-C 09/12/20 0041    Milton Ferguson, MD 09/12/20 1218

## 2020-09-11 NOTE — Discharge Instructions (Addendum)
Vascular follow up   Gastroparesis Nutrition Therapy  Gastroparesis means that your stomach empties very slowly. This happens when the nerves to your stomach are damaged or do not work properly. This can cause bloating, stomach discomfort or pain, feeling full after eating only a small amount of food, nausea, or vomiting. . If you have diabetes in addition to gastroparesis, it is important to control your blood glucose. This will help the stomach empty.    Tips . Following these tips may help your stomach empty faster:  . Eat small, frequent meals (4 to 6 times per day).  . Do not eat solid foods that are high in fat and do not add too much fat to foods. See the Foods Not Recommended table for foods that are high fat.  . High-fat solid foods may delay the emptying of your stomach.  . Liquids that contain fat, such as milkshakes, may be tolerated and can provide needed calories. . Do not eat foods high in fiber. Do not take fiber supplements or fiber bulking agents for constipation.  . Do not eat foods that increase acid reflux:  . Acidic, spicy, fried and greasy foods  . Caffeine  . Mint . Do not drink alcohol or smoke  . Do not drink carbonated beverages, as they increase bloating.  Sarina Ser foods well before swallowing. Solid foods in the stomach do not empty well. If you have difficulty tolerating solid foods, ground foods may be better.  . If symptoms are severe, semi-solid foods or liquids may need to be your main food sources. Choose liquid nutritional supplements that have less than or equal to 2 grams fiber per serving.  . Sit upright while eating and sit upright or walk after meals. Do not lie down for 3 to 4 hours after eating to avoid reflux or regurgitation.  . If you wish to nap during the day, nap first and then eat.  . Drinking fluids at meals can take up room in your stomach, and you might not get enough calories. At every meal, first eat a grain food and a protein food or dairy  product if your body can tolerate it. Drink fluids with calories. It may be better to delay fluids until after the meal and drink more between meals.   Foods Recommended Food Group Foods Recommended   Grains Choose grain foods with less than 2 grams of fiber per serving; these will be made with white flour Crackers: saltines or graham crackers Cold cereal: puffed rice Cream of rice or wheat Grits (fine ground) Gluten free low fiber foods Pretzels White bread, toasted White rice, cook until very soft  Protein Foods Lean meat and poultry: well-cooked, very tender, moist, and chopped fine Fish: tuna, salmon, or white fish Egg whites, scrambled Peanut butter (limit to 1 tablespoon at a time)  Dairy Milk*, drink 2% if tolerated to get more nutrients or lactose-free 2% milk Fortified non-dairy milks: almond, cashew, coconut, or rice (be aware that these options are not good sources of protein so you will need to eat an additional protein food) Fortified pea milk or soymilk (may cause gas and bloating for some) Instant breakfast* (pre-made lactose-free is sold in bottles) Milkshakes* (try blending in  to  cup canned fruit) Ice cream* (low-fat may be tolerated better; use in milkshakes to increase calories) Frozen yogurt Yogurt* Puddings and custard* Sherbet Liquid nutritional supplements with less than or equal to 2 grams fiber per 1 cup serving *Use lactose-free varieties  to reduce gas and bloating  Vegetables Canned and well-cooked vegetables without seeds, skins or hulls Carrots, cooked Mashed potatoes (white, red or yellow) Sweet potato  Fruit Canned, soft and well-cooked fruits without seeds, skins or membranes Applesauce Banana, mashed may be tolerated better Diced peaches/pears fruit cups in juice Melon, very soft, cut into small pieces Fruit nectar juices  Oils When possible choose oils rather than solid fats Canola or olive oil Margarine  Other Clear  soup Gelatin Popsicles   Foods Not Recommended Food Group Foods Not Recommended   Grains Bran Grains foods with 2 or more grams of fiber per serving: barley, brown rice, kasha, quinoa Popcorn Whole grain and high-fiber cereals, including oats or granola Whole grain bread or pasta  Protein Foods Fried meats, poultry or fish Sausage, bacon or hot dogs Seafood Tough meat, meat with gristle: steak, roast beef or pork chops Beans, peas or lentils Nuts  Dairy Cheese slices Liquid nutritional supplements that have more than 2 grams fiber per serving Pea milk, soymilk (may increase gas and bloating)  Vegetables Raw or undercooked vegetables Alfalfa, asparagus, bean sprouts, broccoli, brussels sprouts, cabbage, cauliflower, corn, green peas or any other kind of peas, lima beans, mushrooms, okra, onions, parsnips, peppers, pickles, potato skins, or spinach  Fruit Fresh fruit except for the ones in the foods recommended table Acidic fruit and juices: oranges/orange juice, grapefruit/grapefruit juice, tomatoes/tomato juice Avocado Berries Coconut Dried fruit Fruit skin Mandarin oranges Pineapple  Oils Fried foods of any type  Other Coffee Olives or pickles Pizza Salsa Sushi   Gastroparesis Sample 1-Day Menu  Breakfast 1 slice white toast (1 carbohydrate serving)  1 teaspoon margarine, soft, tub   cup egg substitute  1 cup peach nectar (2 carbohydrate servings)  Morning Snack Smoothie made with:  small banana (1 carbohydrate serving)  1/3 cup [place] strawberry yogurt ( carbohydrate serving)  1 cup 2% milk (1 carbohydrate serving)  Lunch 2 ounces canned chicken  1 teaspoon mayonnaise  9 saltine crackers (1 carbohydrate servings)   cup applesauce (1 carbohydrate serving)  Afternoon Snack 1 slice white toast (1 carbohydrate serving)  1 tablespoon smooth peanut butter  Evening Meal 2 ounces baked fish   cup mashed potatoes (1 carbohydrate serving)  1 teaspoon olive oil  1  cup 2% milk (1 carbohydrate serving)  Evening Snack 1 packet instant breakfast (1 carbohydrate servings)  1 cup 2% milk (1 carbohydrate serving)   Gastroparesis Vegetarian (Lacto-Ovo) Sample 1-Day Menu  Breakfast  cup cooked farina (1 carbohydrate serving)   cup egg substitute  2 teaspoons olive oil   cup peach nectar (2 carbohydrate servings)   cup 2% milk ( carbohydrate serving)  Morning Snack 1 slice white toast (1 carbohydrate serving)  1 tablespoon smooth peanut butter  Lunch  cup vegetable soup (1 carbohydrate serving)  9 saltine crackers (1 carbohydrate serving)   cup applesauce (1 carbohydrate serving)   cup 2% milk ( carbohydrate serving)  Afternoon Snack 6 ounces plain yogurt (1 carbohydrate serving)   small banana (1 carbohydrate servings)  Evening Meal  cup baked tofu  2/3 cup white rice (2 carbohydrate servings)  2 teaspoons olive oil   cup 2% milk ( carbohydrate serving)  Evening Snack 1 packet instant breakfast (1 carbohydrate servings)  1 cup 2% milk (1 carbohydrate serving)   Gastroparesis Vegan Sample 1-Day Menu  Breakfast  cup cooked farina (1 carbohydrate serving)  1/3 cup tofu scramble  2 teaspoons olive oil   cup peach  nectar (2 carbohydrate servings)   cup almond milk fortified with calcium, vitamin B12, and vitamin D  Morning Snack 1 slice white toast (1 carbohydrate serving)  1 tablespoon smooth peanut butter  Lunch  cup vegetable soup (1 carbohydrate serving)  9 saltine crackers (1 carbohydrate serving)   cup applesauce (1 carbohydrate serving)  Afternoon Snack 6 ounces plain soy yogurt (1 carbohydrate servings)   small banana (1 carbohydrate serving)  Evening Meal  cup baked tofu  2/3 cup white rice (2 carbohydrate servings)  2 teaspoons olive oil   cup almond milk fortified with calcium, vitamin B12, and vitamin D  Evening Snack  scoop soy protein powder ( carbohydrate serving)   cup almond milk fortified with calcium,  vitamin B12, and vitamin D  Copyright 2020  [employer]. All rights reserved.

## 2020-09-11 NOTE — ED Notes (Signed)
EKG was not performed during triage.

## 2020-09-12 ENCOUNTER — Encounter (HOSPITAL_COMMUNITY): Payer: Self-pay | Admitting: Internal Medicine

## 2020-09-12 DIAGNOSIS — I723 Aneurysm of iliac artery: Secondary | ICD-10-CM | POA: Diagnosis not present

## 2020-09-12 DIAGNOSIS — K219 Gastro-esophageal reflux disease without esophagitis: Secondary | ICD-10-CM

## 2020-09-12 DIAGNOSIS — R112 Nausea with vomiting, unspecified: Secondary | ICD-10-CM | POA: Insufficient documentation

## 2020-09-12 DIAGNOSIS — E1165 Type 2 diabetes mellitus with hyperglycemia: Secondary | ICD-10-CM

## 2020-09-12 DIAGNOSIS — N189 Chronic kidney disease, unspecified: Secondary | ICD-10-CM

## 2020-09-12 DIAGNOSIS — K5900 Constipation, unspecified: Secondary | ICD-10-CM | POA: Diagnosis not present

## 2020-09-12 DIAGNOSIS — N179 Acute kidney failure, unspecified: Secondary | ICD-10-CM | POA: Diagnosis not present

## 2020-09-12 DIAGNOSIS — I10 Essential (primary) hypertension: Secondary | ICD-10-CM

## 2020-09-12 DIAGNOSIS — R109 Unspecified abdominal pain: Secondary | ICD-10-CM

## 2020-09-12 LAB — RESP PANEL BY RT-PCR (FLU A&B, COVID) ARPGX2
Influenza A by PCR: NEGATIVE
Influenza B by PCR: NEGATIVE
SARS Coronavirus 2 by RT PCR: NEGATIVE

## 2020-09-12 LAB — CBC
HCT: 35.9 % — ABNORMAL LOW (ref 36.0–46.0)
Hemoglobin: 11.6 g/dL — ABNORMAL LOW (ref 12.0–15.0)
MCH: 29.3 pg (ref 26.0–34.0)
MCHC: 32.3 g/dL (ref 30.0–36.0)
MCV: 90.7 fL (ref 80.0–100.0)
Platelets: 149 10*3/uL — ABNORMAL LOW (ref 150–400)
RBC: 3.96 MIL/uL (ref 3.87–5.11)
RDW: 14.3 % (ref 11.5–15.5)
WBC: 6 10*3/uL (ref 4.0–10.5)
nRBC: 0 % (ref 0.0–0.2)

## 2020-09-12 LAB — COMPREHENSIVE METABOLIC PANEL
ALT: 14 U/L (ref 0–44)
AST: 23 U/L (ref 15–41)
Albumin: 3.3 g/dL — ABNORMAL LOW (ref 3.5–5.0)
Alkaline Phosphatase: 104 U/L (ref 38–126)
Anion gap: 7 (ref 5–15)
BUN: 14 mg/dL (ref 8–23)
CO2: 26 mmol/L (ref 22–32)
Calcium: 9.5 mg/dL (ref 8.9–10.3)
Chloride: 105 mmol/L (ref 98–111)
Creatinine, Ser: 1.36 mg/dL — ABNORMAL HIGH (ref 0.44–1.00)
GFR, Estimated: 38 mL/min — ABNORMAL LOW (ref >60)
Glucose, Bld: 124 mg/dL — ABNORMAL HIGH (ref 70–99)
Potassium: 4.4 mmol/L (ref 3.5–5.1)
Sodium: 138 mmol/L (ref 135–145)
Total Bilirubin: 1.4 mg/dL — ABNORMAL HIGH (ref 0.3–1.2)
Total Protein: 6.6 g/dL (ref 6.5–8.1)

## 2020-09-12 LAB — MAGNESIUM: Magnesium: 1.7 mg/dL (ref 1.7–2.4)

## 2020-09-12 LAB — GLUCOSE, CAPILLARY
Glucose-Capillary: 130 mg/dL — ABNORMAL HIGH (ref 70–99)
Glucose-Capillary: 135 mg/dL — ABNORMAL HIGH (ref 70–99)
Glucose-Capillary: 115 mg/dL — ABNORMAL HIGH (ref 70–99)
Glucose-Capillary: 129 mg/dL — ABNORMAL HIGH (ref 70–99)

## 2020-09-12 LAB — HEMOGLOBIN A1C
Hgb A1c MFr Bld: 7.5 % — ABNORMAL HIGH (ref 4.8–5.6)
Mean Plasma Glucose: 168.55 mg/dL

## 2020-09-12 LAB — PROTIME-INR
INR: 1.2 (ref 0.8–1.2)
Prothrombin Time: 14.8 seconds (ref 11.4–15.2)

## 2020-09-12 LAB — PHOSPHORUS: Phosphorus: 2.9 mg/dL (ref 2.5–4.6)

## 2020-09-12 LAB — APTT: aPTT: 25 seconds (ref 24–36)

## 2020-09-12 MED ORDER — LACTATED RINGERS IV BOLUS
500.0000 mL | Freq: Once | INTRAVENOUS | Status: AC
  Administered 2020-09-12: 500 mL via INTRAVENOUS

## 2020-09-12 MED ORDER — ALPRAZOLAM 0.5 MG PO TABS: 0.5000 mg | ORAL_TABLET | Freq: Three times a day (TID) | ORAL | Status: DC | PRN

## 2020-09-12 MED ORDER — AMLODIPINE BESYLATE 5 MG PO TABS
5.0000 mg | ORAL_TABLET | Freq: Every day | ORAL | Status: DC
  Administered 2020-09-13 – 2020-09-14 (×2): 5 mg via ORAL
  Filled 2020-09-12 (×2): qty 1

## 2020-09-12 MED ORDER — INSULIN ASPART 100 UNIT/ML ~~LOC~~ SOLN
0.0000 [IU] | SUBCUTANEOUS | Status: DC
  Administered 2020-09-12 (×2): 1 [IU] via SUBCUTANEOUS
  Administered 2020-09-13: 5 [IU] via SUBCUTANEOUS

## 2020-09-12 MED ORDER — SODIUM CHLORIDE 0.9 % IV SOLN: Freq: Once | INTRAVENOUS | Status: AC

## 2020-09-12 MED ORDER — FENTANYL CITRATE (PF) 100 MCG/2ML IJ SOLN
25.0000 ug | INTRAMUSCULAR | Status: DC | PRN
  Administered 2020-09-12 (×3): 25 ug via INTRAVENOUS
  Filled 2020-09-12 (×3): qty 2

## 2020-09-12 MED ORDER — SODIUM CHLORIDE 0.9 % IV SOLN: INTRAVENOUS | Status: DC

## 2020-09-12 MED ORDER — ASPIRIN 81 MG PO CHEW
81.0000 mg | CHEWABLE_TABLET | Freq: Every day | ORAL | Status: DC
  Administered 2020-09-13 – 2020-09-14 (×2): 81 mg via ORAL
  Filled 2020-09-12 (×2): qty 1

## 2020-09-12 MED ORDER — METOCLOPRAMIDE HCL 5 MG/ML IJ SOLN
5.0000 mg | Freq: Three times a day (TID) | INTRAMUSCULAR | Status: DC
  Administered 2020-09-12 – 2020-09-13 (×3): 5 mg via INTRAVENOUS
  Filled 2020-09-12 (×3): qty 2

## 2020-09-12 MED ORDER — ONDANSETRON HCL 4 MG/2ML IJ SOLN
4.0000 mg | Freq: Four times a day (QID) | INTRAMUSCULAR | Status: DC | PRN
  Administered 2020-09-13: 4 mg via INTRAVENOUS
  Filled 2020-09-12: qty 2

## 2020-09-12 MED ORDER — POLYETHYLENE GLYCOL 3350 17 G PO PACK: 17.0000 g | PACK | Freq: Every day | ORAL | Status: DC | PRN

## 2020-09-12 MED ORDER — INSULIN GLARGINE 100 UNIT/ML ~~LOC~~ SOLN
6.0000 [IU] | Freq: Every day | SUBCUTANEOUS | Status: DC
  Administered 2020-09-13 – 2020-09-14 (×2): 6 [IU] via SUBCUTANEOUS
  Filled 2020-09-12 (×4): qty 0.06

## 2020-09-12 MED ORDER — PANTOPRAZOLE SODIUM 40 MG IV SOLR
40.0000 mg | INTRAVENOUS | Status: DC
  Administered 2020-09-12: 40 mg via INTRAVENOUS
  Filled 2020-09-12: qty 40

## 2020-09-12 MED ORDER — ENOXAPARIN SODIUM 30 MG/0.3ML ~~LOC~~ SOLN
30.0000 mg | SUBCUTANEOUS | Status: DC
  Administered 2020-09-13: 30 mg via SUBCUTANEOUS
  Filled 2020-09-12: qty 0.3

## 2020-09-12 NOTE — Evaluation (Addendum)
Physical Therapy Evaluation Patient Details Name: Traci Clark MRN: [id number] DOB: 08/09/1930 Today's Date: 09/12/2020   History of Present Illness  Traci Clark is a 87 y.o. female with c/o abdominal pain with nausea and vomitting. PMH: colon cancer, MI, diabetes, GERD, gout, constipation , L THA    Clinical Impression  Pt admitted with above diagnosis. Pt independent with rollator walker at baseline, independent with bathing and dressing, and grandson performing household chores. Pt reports multiple falls, gets up with walker or with grandson to assist as needed. Pt currently ambulates 50 ft with RW, no LOB, but limited by dizziness complaints and BP stable- RN notified. Intermittent verbal cues for RW management and hand placement with transfers and ambulation. Pt reports previous success with HHPT so recommend HHPT with family to assist as needed at home. Pt currently with functional limitations due to the deficits listed below (see PT Problem List). Pt will benefit from skilled PT to increase their independence and safety with mobility to allow discharge to the venue listed below.       Follow Up Recommendations Home health PT    Equipment Recommendations  None recommended by PT    Recommendations for Other Services       Precautions / Restrictions Precautions Precautions: Fall Restrictions Weight Bearing Restrictions: No      Mobility  Bed Mobility Overal bed mobility: Modified Independent    General bed mobility comments: increased time, light use of bedrail to upright trunk from flat bed    Transfers Overall transfer level: Needs assistance Equipment used: Rolling walker (2 wheeled) Transfers: Sit to/from Stand Sit to Stand: Supervision  General transfer comment: BUE assisting to power up with RW, initially slightly unsteady that improves with time  Ambulation/Gait Ambulation/Gait assistance: Supervision Gait Distance (Feet): 50 Feet Assistive device:  Rolling walker (2 wheeled) Gait Pattern/deviations: Step-through pattern;Decreased stride length;Trunk flexed Gait velocity: decreased   General Gait Details: slow steps with minimal bil foot clearance, increased time with turns, no LOB, limited by dizziness complaints  Stairs            Wheelchair Mobility    Modified Rankin (Stroke Patients Only)       Balance Overall balance assessment: Needs assistance Sitting-balance support: Feet supported Sitting balance-Leahy Scale: Good Sitting balance - Comments: seated EOB   Standing balance support: During functional activity;Bilateral upper extremity supported Standing balance-Leahy Scale: Poor Standing balance comment: reliant on UE support        Pertinent Vitals/Pain Pain Assessment: 0-10 Pain Score: 5  Pain Location: abdomen and headache Pain Descriptors / Indicators: Aching;Headache Pain Intervention(s): Limited activity within patient's tolerance;Monitored during session;Premedicated before session;Repositioned    Home Living Family/patient expects to be discharged to:: Private residence Living Arrangements: Spouse/significant other;Children Available Help at Discharge: Family;Available 24 hours/day Type of Home: House Home Access: Level entry     Home Layout: Able to live on main level with bedroom/bathroom;Laundry or work area in [place]: Environmental consultant - 4 wheels;Cane - single point;Tub bench      Prior Function Level of Independence: Independent with assistive device(s)         Comments: Pt reports using rollator walker for ambulation around the home and limited community distances. Pt reports grandson lives with her and spouse and performs household chores. Pt reports independent with bathing and dressing. Pt reports multiple falls, pulls on walker to get up or family assists.     Hand Dominance   Dominant Hand: Right  Extremity/Trunk Assessment   Upper Extremity Assessment Upper  Extremity Assessment: Overall WFL for tasks assessed    Lower Extremity Assessment Lower Extremity Assessment: Overall WFL for tasks assessed (AROM WNL, strength 4/5 throughout, reports "a little" numbnes and tingling in BLE)    Cervical / Trunk Assessment Cervical / Trunk Assessment: Kyphotic  Communication   Communication: No difficulties  Cognition Arousal/Alertness: Awake/alert Behavior During Therapy: WFL for tasks assessed/performed Overall Cognitive Status: Within Functional Limits for tasks assessed         General Comments General comments (skin integrity, edema, etc.): pt c/o constant dizziness, BP 131/36mmHg- RN notified    Exercises     Assessment/Plan    PT Assessment Patient needs continued PT services  PT Problem List Decreased activity tolerance;Decreased balance;Cardiopulmonary status limiting activity       PT Treatment Interventions DME instruction;Gait training;Functional mobility training;Therapeutic activities;Therapeutic exercise;Balance training;Patient/family education    PT Goals (Current goals can be found in the Care Plan section)  Acute Rehab PT Goals Patient Stated Goal: return home with family to assist PT Goal Formulation: With patient/family Time For Goal Achievement: 09/26/20 Potential to Achieve Goals: Good    Frequency Min 3X/week   Barriers to discharge        Co-evaluation               AM-PAC PT "6 Clicks" Mobility  Outcome Measure Help needed turning from your back to your side while in a flat bed without using bedrails?: None Help needed moving from lying on your back to sitting on the side of a flat bed without using bedrails?: None Help needed moving to and from a bed to a chair (including a wheelchair)?: A Little Help needed standing up from a chair using your arms (e.g., wheelchair or bedside chair)?: A Little Help needed to walk in hospital room?: A Little Help needed climbing 3-5 steps with a railing? : A  Little 6 Click Score: 20    End of Session Equipment Utilized During Treatment: Gait belt Activity Tolerance: Patient tolerated treatment well Patient left: in chair;with call bell/phone within reach;with chair alarm set;with family/visitor present Nurse Communication: Mobility status;Other (comment) (dizziness) PT Visit Diagnosis: Other abnormalities of gait and mobility (R26.89)    Time: 4709-6283 PT Time Calculation (min) (ACUTE ONLY): 27 min   Charges:   PT Evaluation $PT Eval Low Complexity: 1 Low PT Treatments $Therapeutic Activity: 8-22 mins         Traci Clark PT, DPT 09/12/20, 3:01 PM

## 2020-09-12 NOTE — Progress Notes (Signed)
PROGRESS NOTE    Traci Clark  DGU:[id number] DOB: Oct 07, 1930 DOA: 09/11/2020 PCP: Lavella Lemons, PA    Brief Narrative:  87 y.o. female with medical history significant for hypertension, T2DM, GERD, gout and constipation who presents presents to the emergency department due to 1 week onset of abdominal pain which is associated with nausea and vomiting.  Abdominal pain was diffuse but worse in lower quadrants, it was persistent and crampy in nature.  It was rated at 9/10 (max) and 5-6/10 (minimal).  No alleviating or aggravating factor known.  She states that she has been to her PCP twice without improvement in the abdominal pain, so she was asked to go to the ED for further evaluation.  She denies fever, chills, chest pain, shortness of breath.  Last bowel movement was 4/24  Assessment & Plan:   Active Problems:   GERD (gastroesophageal reflux disease)   Essential hypertension   Gout   Nausea & vomiting   Intractable abdominal pain   Acute kidney injury superimposed on CKD (HCC)   Bilateral iliac artery aneurysm (HCC)   Hyperglycemia due to diabetes mellitus (HCC)   Constipation   Intractable abdominal pain R/O gastroparesis -Abd CT personally reviewed, unremarkable with no evidence of obstruction -Continue IV NS at 75 mLs/Hr -Continue with analgesia as needed -Have started reglan with some improvement already -Will begin advancing diet with clears, advance as tolerated  GERD Continue Protonix as tolerated  Acute kidney injury on CKD 3B BUN/creatinine 15/1.54 (Baseline Creatinine 1.1 at 1.3) Continue IVF hydration Repeat bmet in AM  Hyperglycemia secondary to T2DM Continue ISS and hypoglycemic protocol Given limited PO intake, will continue on 1/2 dose of home lantus, 6 units  Incidental finding of bilateral iliac artery aneurysm CT abdomen and pelvis showed right iliac artery aneurysm measuring 1.8 cm and left iliac artery aneurysm measuring 1.7cm  diameter Recommend outpatient follow up  Constipation Will continue on PRN miralax  Essential hypertension (controlled) Continue Norvasc when patient resumes intake  Gout Continue home medication when patient resumes oral intake   DVT prophylaxis: Lovenox subq Code Status: Full Family Communication: Pt in room, family not at bedside  Status is: Observation  The patient remains OBS appropriate and will d/c before 2 midnights.  Dispo: The patient is from: Home              Anticipated d/c is to: Home              Patient currently is not medically stable to d/c.   Difficult to place patient No       Consultants:     Procedures:     Antimicrobials: Anti-infectives (From admission, onward)   None       Subjective: Complained of mild nausea this AM  Objective: Vitals:   09/12/20 0200 09/12/20 0506 09/12/20 1003 09/12/20 1327  BP:  117/69 137/75 131/63  Pulse:  (!) 47 63 (!) 53  Resp:  16 16 16   Temp:  97.8 F (36.6 C) 98 F (36.7 C) 98.4 F (36.9 C)  TempSrc:   Oral Oral  SpO2:  98% 98% 95%  Weight: 71 kg     Height:        Intake/Output Summary (Last 24 hours) at 09/12/2020 1648 Last data filed at 09/12/2020 9563 Gross per 24 hour  Intake 1000 ml  Output --  Net 1000 ml   Filed Weights   09/11/20 1349 09/12/20 0200  Weight: 69.9 kg 71 kg  Examination: General exam: Awake, laying in bed, in nad Respiratory system: Normal respiratory effort, no wheezing Cardiovascular system: regular rate, s1, s2 Gastrointestinal system: Soft, nondistended, decreased BS Central nervous system: CN2-12 grossly intact, strength intact Extremities: Perfused, no clubbing Skin: Normal skin turgor, no notable skin lesions seen Psychiatry: Mood normal // no visual hallucinations   Data Reviewed: I have personally reviewed following labs and imaging studies  CBC: Recent Labs  Lab 09/11/20 1559 09/12/20 0651  WBC 6.0 6.0  NEUTROABS 2.8  --   HGB 13.0  11.6*  HCT 39.3 35.9*  MCV 88.7 90.7  PLT 193 416*   Basic Metabolic Panel: Recent Labs  Lab 09/11/20 1559 09/12/20 0651  NA 134* 138  K 4.1 4.4  CL 98 105  CO2 25 26  GLUCOSE 119* 124*  BUN 15 14  CREATININE 1.54* 1.36*  CALCIUM 9.9 9.5  MG  --  1.7  PHOS  --  2.9   GFR: Estimated Creatinine Clearance: 27.3 mL/min (A) (by C-G formula based on SCr of 1.36 mg/dL (H)). Liver Function Tests: Recent Labs  Lab 09/11/20 1559 09/12/20 0651  AST 29 23  ALT 17 14  ALKPHOS 126 104  BILITOT 1.4* 1.4*  PROT 8.0 6.6  ALBUMIN 4.0 3.3*   Recent Labs  Lab 09/11/20 1559  LIPASE 31   No results for input(s): AMMONIA in the last 168 hours. Coagulation Profile: Recent Labs  Lab 09/12/20 0651  INR 1.2   Cardiac Enzymes: No results for input(s): CKTOTAL, CKMB, CKMBINDEX, TROPONINI in the last 168 hours. BNP (last 3 results) No results for input(s): PROBNP in the last 8760 hours. HbA1C: Recent Labs    09/12/20 0651  HGBA1C 7.5*   CBG: Recent Labs  Lab 09/11/20 1349 09/12/20 0757 09/12/20 1054 09/12/20 1558  GLUCAP 146* 130* 135* 115*   Lipid Profile: No results for input(s): CHOL, HDL, LDLCALC, TRIG, CHOLHDL, LDLDIRECT in the last 72 hours. Thyroid Function Tests: No results for input(s): TSH, T4TOTAL, FREET4, T3FREE, THYROIDAB in the last 72 hours. Anemia Panel: No results for input(s): VITAMINB12, FOLATE, FERRITIN, TIBC, IRON, RETICCTPCT in the last 72 hours. Sepsis Labs: No results for input(s): PROCALCITON, LATICACIDVEN in the last 168 hours.  Recent Results (from the past 240 hour(s))  Resp Panel by RT-PCR (Flu A&B, Covid) Nasopharyngeal Swab     Status: None   Collection Time: 09/11/20 11:45 PM   Specimen: Nasopharyngeal Swab; Nasopharyngeal(NP) swabs in vial transport medium  Result Value Ref Range Status   SARS Coronavirus 2 by RT PCR NEGATIVE NEGATIVE Final    Comment: (NOTE) SARS-CoV-2 target nucleic acids are NOT DETECTED.  The SARS-CoV-2 RNA is  generally detectable in upper respiratory specimens during the acute phase of infection. The lowest concentration of SARS-CoV-2 viral copies this assay can detect is 138 copies/mL. A negative result does not preclude SARS-Cov-2 infection and should not be used as the sole basis for treatment or other patient management decisions. A negative result may occur with  improper specimen collection/handling, submission of specimen other than nasopharyngeal swab, presence of viral mutation(s) within the areas targeted by this assay, and inadequate number of viral copies(<138 copies/mL). A negative result must be combined with clinical observations, patient history, and epidemiological information. The expected result is Negative.  Fact Sheet for Patients:  EntrepreneurPulse.com.au  Fact Sheet for Healthcare Providers:  IncredibleEmployment.be  This test is no t yet approved or cleared by the [place] FDA and  has been authorized for detection and/or diagnosis  of SARS-CoV-2 by FDA under an Emergency Use Authorization (EUA). This EUA will remain  in effect (meaning this test can be used) for the duration of the COVID-19 declaration under Section 564(b)(1) of the Act, 21 U.S.C.section 360bbb-3(b)(1), unless the authorization is terminated  or revoked sooner.       Influenza A by PCR NEGATIVE NEGATIVE Final   Influenza B by PCR NEGATIVE NEGATIVE Final    Comment: (NOTE) The Xpert Xpress SARS-CoV-2/FLU/RSV plus assay is intended as an aid in the diagnosis of influenza from Nasopharyngeal swab specimens and should not be used as a sole basis for treatment. Nasal washings and aspirates are unacceptable for Xpert Xpress SARS-CoV-2/FLU/RSV testing.  Fact Sheet for Patients: EntrepreneurPulse.com.au  Fact Sheet for Healthcare Providers: IncredibleEmployment.be  This test is not yet approved or cleared by the [place] FDA and has been authorized for detection and/or diagnosis of SARS-CoV-2 by FDA under an Emergency Use Authorization (EUA). This EUA will remain in effect (meaning this test can be used) for the duration of the COVID-19 declaration under Section 564(b)(1) of the Act, 21 U.S.C. section 360bbb-3(b)(1), unless the authorization is terminated or revoked.  Performed at [facility], [address] [place], [place] [address]      Radiology Studies: DG Chest 2 View  Result Date: 09/11/2020 CLINICAL DATA:  Chest pain. EXAM: CHEST - 2 VIEW COMPARISON:  July 1921. FINDINGS: The heart size and mediastinal contours are within normal limits. Tortuous aorta. No consolidation. No visible pleural effusions or pneumothorax. No acute osseous abnormality. Similar degenerative change of the left shoulder. IMPRESSION: No evidence of acute cardiopulmonary disease. Electronically Signed   By: Margaretha Sheffield MD   On: 09/11/2020 15:00   CT ABDOMEN PELVIS W CONTRAST  Result Date: 09/11/2020 CLINICAL DATA:  Abdominal pain, nausea, vomiting, constipation for 1 week. EXAM: CT ABDOMEN AND PELVIS WITH CONTRAST TECHNIQUE: Multidetector CT imaging of the abdomen and pelvis was performed using the standard protocol following bolus administration of intravenous contrast. CONTRAST:  1100mL OMNIPAQUE IOHEXOL 300 MG/ML  SOLN COMPARISON:  12/06/2019 FINDINGS: Lower chest: The lung bases are clear. Hepatobiliary: Diffuse fatty infiltration of the liver. Subcentimeter cysts. Gallbladder is surgically absent. No bile duct dilatation. Pancreas: Fatty replacement of the head and body of the pancreas. No acute inflammatory changes or loculated collections. Spleen: Normal in size without focal abnormality. Adrenals/Urinary Tract: No adrenal gland nodules. Multiple parenchymal cysts in the kidneys. Nephrograms are symmetrical. No hydronephrosis or hydroureter. Bladder is normal. Stomach/Bowel: Stomach, small bowel, and colon are  decompressed. No abnormal distention. Partial right colectomy with ileocolonic anastomosis. Vascular/Lymphatic: Calcification of the aorta. Tortuous aorta without aneurysmal dilatation. Right iliac artery aneurysm measuring 1.8 cm diameter. Left iliac artery aneurysm measuring 1.7 cm diameter. No significant lymphadenopathy. Reproductive: Uterus and bilateral adnexa are unremarkable. Other: No free air or free fluid in the abdomen. Abdominal wall musculature appears intact. Musculoskeletal: Previous left hip arthroplasty. Degenerative changes in the spine and right hip. No destructive bone lesions. IMPRESSION: 1. No acute process demonstrated in the abdomen or pelvis. 2. Diffuse fatty infiltration of the liver. 3. Fatty replacement of the head and body of the pancreas. 4. Bilateral iliac artery aneurysms. 5. Aortic atherosclerosis. Aortic Atherosclerosis (ICD10-I70.0). Electronically Signed   By: Lucienne Capers M.D.   On: 09/11/2020 19:38    Scheduled Meds: . acetaminophen  650 mg Oral Once  . enoxaparin (LOVENOX) injection  30 mg Subcutaneous Q24H  . insulin aspart  0-9 Units Subcutaneous Q4H  .  metoCLOPramide (REGLAN) injection  5 mg Intravenous Q8H  . pantoprazole (PROTONIX) IV  40 mg Intravenous Q24H   Continuous Infusions:   LOS: 0 days   Marylu Lund, MD [facility] Pager On Amion  If 7PM-7AM, please contact night-coverage 09/12/2020, 4:48 PM

## 2020-09-12 NOTE — Progress Notes (Signed)
Alert and oriented x 4, admitted from ED. C/o head and abdominal pain, MD paged and new pain order given and administered. Oriented to room and call bell. LR bolus infused. SCD initiated. NPO at this time. No other concerns at this time. Continue to monitor.

## 2020-09-12 NOTE — Progress Notes (Signed)
Complaining of sore tongue. Mouth hygiene provided  Oral swabs with mouth moisturizer at bedside.  Will cont to monitor.

## 2020-09-12 NOTE — Progress Notes (Signed)
Initial Nutrition Assessment  DOCUMENTATION CODES:   Not applicable  INTERVENTION:   -RD will follow for diet advancement and add supplements as appropriate -Provided "Gastroparesis Nutrition Therapy" from [facility] Nutrition Care Manual; attached to AVS/ discharge summary  NUTRITION DIAGNOSIS:   Inadequate oral intake related to altered GI function as evidenced by NPO status.  GOAL:   Patient will meet greater than or equal to 90% of their needs  MONITOR:   Diet advancement,Labs,Weight trends,Skin,I & O's  REASON FOR ASSESSMENT:   Malnutrition Screening Tool    ASSESSMENT:   Traci Clark is a 87 y.o. female with medical history significant for hypertension, T2DM, GERD, gout and constipation who presents presents to the emergency department due to 1 week onset of abdominal pain which is associated with nausea and vomiting.  Abdominal pain was diffuse but worse in lower quadrants, it was persistent and crampy in nature.  It was rated at 9/10 (max) and 5-6/10 (minimal).  No alleviating or aggravating factor known.  She states that she has been to her PCP twice without improvement in the abdominal pain, so she was asked to go to the ED for further evaluation.  She denies fever, chills, chest pain, shortness of breath.  Last bowel movement was 4/24.  Pt admitted with intractable nausea and vomiting secondary to gastroparesis.   Reviewed I/O's: +1 L x 24 hours  Pt unavailable at time of visit. Attempted to speak with pt via call to hospital room phone, however, unable to reach. RD unable to obtain further nutrition-related history or complete nutrition-focused physical exam at this time.  Per H&P, pt with abdominal pain, nausea, and vomiting for 1 week PTA. Suspect decreased oral intake during this time period.   Reviewed wt hx; wt has been stable over the past 3 years.   No results found for: HGBA1C PTA DM medications are 4-10 units insulin aspart TID with meals and 12 units  insulin glargine daily.   Labs reviewed: CBGS: 130 (inpatient orders for glycemic control are 0-9 units insulin aspart every 4 hours).   Diet Order:   Diet Order            Diet NPO time specified  Diet effective now                 EDUCATION NEEDS:   No education needs have been identified at this time  Skin:  Skin Assessment: Reviewed RN Assessment  Last BM:  Unknown  Height:   Ht Readings from Last 1 Encounters:  09/11/20 5\' 6"  (1.676 m)    Weight:   Wt Readings from Last 1 Encounters:  09/12/20 71 kg    Ideal Body Weight:  59.1 kg  BMI:  Body mass index is 25.26 kg/m.  Estimated Nutritional Needs:   Kcal:  1550-1750  Protein:  75-90 grams  Fluid:  > 1.5 L    Loistine Chance, RD, LDN, [place] Registered Dietitian II Certified Diabetes Care and Education Specialist Please refer to [facility] for RD and/or RD on-call/weekend/after hours pager

## 2020-09-12 NOTE — H&P (Addendum)
History and Physical  Traci Clark [id number] DOB: 17-Oct-1930 DOA: 09/11/2020  Referring physician:Joy, Helane Gunther, PA-C PCP: Lavella Lemons, PA  Patient coming from: Home  Chief Complaint: Abdominal pain, nausea and vomiting  HPI: Traci Clark is a 87 y.o. female with medical history significant for hypertension, T2DM, GERD, gout and constipation who presents presents to the emergency department due to 1 week onset of abdominal pain which is associated with nausea and vomiting.  Abdominal pain was diffuse but worse in lower quadrants, it was persistent and crampy in nature.  It was rated at 9/10 (max) and 5-6/10 (minimal).  No alleviating or aggravating factor known.  She states that she has been to her PCP twice without improvement in the abdominal pain, so she was asked to go to the ED for further evaluation.  She denies fever, chills, chest pain, shortness of breath.  Last bowel movement was 4/24.  ED Course:  In the emergency department, she was hemodynamically stable, work-up in the ED showed normal CBC, and BMP except for creatinine of 1.54 (Baseline Creatinine 1.1 at 1.3).  Urinalysis was negative for UTI, troponin x1 was negative, lipase 31.  Influenza A, B and SARS coronavirus 2 was negative. CT abdomen and pelvis with contrast showed no acute process demonstrated in the abdomen or pelvis Chest x-ray showed no evidence of acute cardiopulmonary disease She was treated with IV fentanyl, Ativan was given.  Zofran and Reglan were given.  Hospitalist was asked to admit patient for further evaluation and management.   Review of Systems: Constitutional: Negative for chills and fever.  HENT: Negative for ear pain and sore throat.   Eyes: Negative for pain and visual disturbance.  Respiratory: Negative for cough, chest tightness and shortness of breath.   Cardiovascular: Negative for chest pain and palpitations.  Gastrointestinal: Positive for abdominal pain, nausea and  vomiting.  Endocrine: Negative for polyphagia and polyuria.  Genitourinary: Negative for decreased urine volume, dysuria Musculoskeletal: Negative for arthralgias and back pain.  Skin: Negative for color change and rash.  Allergic/Immunologic: Negative for immunocompromised state.  Neurological: Negative for tremors, syncope, speech difficulty, weakness, light-headedness and headaches.  Hematological: Does not bruise/bleed easily.  All other systems reviewed and are negative  ewed and are negative. Past Medical History:  Diagnosis Date  . Anxiety   . Arthritis   . Chronic constipation   . Colon cancer ([place])    colon  . Complication of anesthesia   . Diabetes mellitus without complication ([place])   . DVT (deep vein thrombosis) in pregnancy   . Dysphagia   . GERD (gastroesophageal reflux disease)   . Glaucoma   . Gout   . H/O hiatal hernia   . History of kidney stones   . MI (myocardial infarction) ([place]) 1993  . PONV (postoperative nausea and vomiting)   . Restless leg syndrome   . Seizure ([place])    withdrawal from xanax  . Vertigo    Past Surgical History:  Procedure Laterality Date  . BIOPSY  07/21/2020   Procedure: BIOPSY;  Surgeon: Harvel Quale, MD;  [facility];  Service: Gastroenterology;;  . CHOLECYSTECTOMY    . COLECTOMY  2003  . COLONOSCOPY N/A 07/29/2012   Procedure: COLONOSCOPY;  Surgeon: Rogene Houston, MD;  [facility];  Service: Endoscopy;  Laterality: N/A;  100  . COLONOSCOPY WITH PROPOFOL N/A 12/13/2016   Procedure: COLONOSCOPY WITH PROPOFOL;  Surgeon: Rogene Houston, MD;  [facility];  Service: Endoscopy;  Laterality: N/A;  8:30  . COLONOSCOPY WITH PROPOFOL N/A 07/21/2020   Procedure: COLONOSCOPY WITH PROPOFOL;  Surgeon: Dolores Frame, MD;  [facility];  Service: Gastroenterology;  Laterality: N/A;  AM  . ESOPHAGEAL DILATION  11/03/2015   Procedure: ESOPHAGEAL DILATION;  Surgeon: Malissa Hippo, MD;  [facility];  Service: Endoscopy;;  . ESOPHAGEAL DILATION N/A 09/26/2017   Procedure: ESOPHAGEAL DILATION;  Surgeon: Malissa Hippo, MD;  [facility];  Service: Endoscopy;  Laterality: N/A;  . ESOPHAGOGASTRODUODENOSCOPY N/A 03/25/2014   Procedure: ESOPHAGOGASTRODUODENOSCOPY (EGD);  Surgeon: Malissa Hippo, MD;  [facility];  Service: Endoscopy;  Laterality: N/A;  1055  . ESOPHAGOGASTRODUODENOSCOPY N/A 11/03/2015   Procedure: ESOPHAGOGASTRODUODENOSCOPY (EGD);  Surgeon: Malissa Hippo, MD;  [facility];  Service: Endoscopy;  Laterality: N/A;  1:55 - moved to 11:15 - Ann to notify - moved to 12:45 - pt knows to arrive at 11:45  . ESOPHAGOGASTRODUODENOSCOPY N/A 09/26/2017   Procedure: ESOPHAGOGASTRODUODENOSCOPY (EGD);  Surgeon: Malissa Hippo, MD;  [facility];  Service: Endoscopy;  Laterality: N/A;  . ESOPHAGOGASTRODUODENOSCOPY (EGD) WITH PROPOFOL N/A 07/21/2020   Procedure: ESOPHAGOGASTRODUODENOSCOPY (EGD) WITH PROPOFOL;  Surgeon: Dolores Frame, MD;  [facility];  Service: Gastroenterology;  Laterality: N/A;  . MALONEY DILATION N/A 03/25/2014   Procedure: Elease Hashimoto DILATION;  Surgeon: Malissa Hippo, MD;  [facility];  Service: Endoscopy;  Laterality: N/A;  . POLYPECTOMY  12/13/2016   Procedure: POLYPECTOMY;  Surgeon: Malissa Hippo, MD;  [facility];  Service: Endoscopy;;  polyp  . POLYPECTOMY  07/21/2020   Procedure: POLYPECTOMY;  Surgeon: Marguerita Merles, Reuel Boom, MD;  [facility];  Service: Gastroenterology;;  . TOTAL HIP ARTHROPLASTY Left   . TUBAL LIGATION    . VEIN SURGERY Bilateral    stripped vein due to DVT    Social History:  reports that she has never smoked. She has never used smokeless tobacco. She reports that she does not drink alcohol and does not use drugs.   Allergies  Allergen Reactions  . Amoxicillin Anaphylaxis and Rash    "Skin peeled  off"  . Macrobid [Nitrofurantoin Monohyd Macro] Anaphylaxis, Rash and Other (See Comments)    "Skin peeled off"  . Codeine Nausea And Vomiting and Other (See Comments)    shaking  . Darvon [Propoxyphene] Nausea And Vomiting and Other (See Comments)    shaking    History reviewed. No pertinent family history.    Prior to Admission medications   Medication Sig Start Date End Date Taking? Authorizing Provider  acetaminophen (TYLENOL) 325 MG tablet Take 650 mg by mouth every 6 (six) hours as needed for pain.   Yes [provider]  allopurinol (ZYLOPRIM) 300 MG tablet Take 300 mg by mouth daily.   Yes [provider]  ALPRAZolam (XANAX) 0.5 MG tablet Take 1 tablet (0.5 mg total) by mouth 3 (three) times daily as needed. Patient taking differently: Take 0.5 mg by mouth 3 (three) times daily as needed for anxiety. 10/01/17  Yes Vassie Loll, MD  amLODipine (NORVASC) 5 MG tablet Take 5 mg by mouth daily.   Yes [provider]  aspirin 81 MG tablet Take 1 tablet (81 mg total) by mouth daily. 11/04/15  Yes Rehman, Joline Maxcy, MD  BIOTIN PO Take 1 capsule by mouth daily.   Yes [provider]  colchicine 0.6 MG  tablet Take 0.6 mg by mouth daily as needed (gout).   Yes [provider]  fluticasone (VERAMYST) 27.5 MCG/SPRAY nasal spray Place 2 sprays into the nose daily.   Yes [provider]  furosemide (LASIX) 20 MG tablet Take 20 mg by mouth daily.   Yes [provider]  insulin aspart (NOVOLOG FLEXPEN RELION) 100 UNIT/ML FlexPen Inject 4-10 Units into the skin 3 (three) times daily with meals. Per sliding scale   Yes [provider]  Insulin Glargine (BASAGLAR KWIKPEN [place]) Inject 12 Units into the skin at bedtime.   Yes [provider]  mirtazapine (REMERON) 15 MG tablet Take 15 mg by mouth at bedtime.   Yes [provider]  Multiple Vitamins-Minerals (MULTIVITAMIN WITH MINERALS) tablet Take 1 tablet by mouth  daily.   Yes [provider]  nystatin (MYCOSTATIN) 100000 UNIT/ML suspension Take 5 mLs by mouth 4 (four) times daily. 09/06/20  Yes [provider]  pantoprazole (PROTONIX) 40 MG tablet Take 40 mg by mouth daily.   Yes [provider]  polyethylene glycol (MIRALAX / GLYCOLAX) packet Take 17 g by mouth daily.   Yes [provider]  potassium chloride (KLOR-CON) 10 MEQ tablet Take 10 mEq by mouth daily.   Yes [provider]  traMADol (ULTRAM) 50 MG tablet Take 50 mg by mouth every 6 (six) hours as needed for severe pain.   Yes [provider]  Travoprost, BAK Free, (TRAVATAN) 0.004 % SOLN ophthalmic solution Place 1 drop into both eyes at bedtime.   Yes [provider]  lubiprostone (AMITIZA) 8 MCG capsule Take 1 capsule (8 mcg total) by mouth 2 (two) times daily with a meal. Patient not taking: Reported on 09/11/2020 07/06/20   Harvel Quale, MD  meclizine (ANTIVERT) 25 MG tablet Take 25 mg by mouth 3 (three) times daily as needed for dizziness.  Patient not taking: Reported on 09/11/2020    [provider]  ondansetron (ZOFRAN-ODT) 8 MG disintegrating tablet Take 1 tablet by mouth every 6 (six) hours as needed for nausea or vomiting.  Patient not taking: Reported on 09/11/2020 10/18/15   [provider]    Physical Exam: BP 117/69 (BP Location: Right Arm)   Pulse (!) 47   Temp 97.8 F (36.6 C)   Resp 16   Ht 5\' 6"  (1.676 m)   Wt 71 kg   SpO2 98%   BMI 25.26 kg/m   . General: 87 y.o. year-old female well developed well nourished in no acute distress.  Alert and oriented x3. Marland Kitchen HEENT: NCAT, EOMI . Neck: Supple, trachea medial . Cardiovascular: Bradycardia.  Regular rate and rhythm with no rubs or gallops.  No thyromegaly or JVD noted.  No lower extremity edema. 2/4 pulses in all 4 extremities. Marland Kitchen Respiratory: Clear to auscultation with no wheezes or rales. Good inspiratory effort. . Abdomen: Soft  nontender nondistended with normal bowel sounds x4 quadrants. . Muskuloskeletal: No cyanosis, clubbing or edema noted bilaterally . Neuro: CN II-XII intact,  sensation, reflexes intact . Skin: No ulcerative lesions noted or rashes . Psychiatry: Mood is appropriate for condition and setting          Labs on Admission:  Basic Metabolic Panel: Recent Labs  Lab 09/11/20 1559  NA 134*  K 4.1  CL 98  CO2 25  GLUCOSE 119*  BUN 15  CREATININE 1.54*  CALCIUM 9.9   Liver Function Tests: Recent Labs  Lab 09/11/20 1559  AST 29  ALT 17  ALKPHOS 126  BILITOT 1.4*  PROT 8.0  ALBUMIN 4.0   Recent Labs  Lab 09/11/20 1559  LIPASE 31   No results for input(s): AMMONIA in the last 168 hours. CBC: Recent Labs  Lab 09/11/20 1559  WBC 6.0  NEUTROABS 2.8  HGB 13.0  HCT 39.3  MCV 88.7  PLT 193   Cardiac Enzymes: No results for input(s): CKTOTAL, CKMB, CKMBINDEX, TROPONINI in the last 168 hours.  BNP (last 3 results) No results for input(s): BNP in the last 8760 hours.  ProBNP (last 3 results) No results for input(s): PROBNP in the last 8760 hours.  CBG: Recent Labs  Lab 09/11/20 1349  GLUCAP 146*    Radiological Exams on Admission: DG Chest 2 View  Result Date: 09/11/2020 CLINICAL DATA:  Chest pain. EXAM: CHEST - 2 VIEW COMPARISON:  July 1921. FINDINGS: The heart size and mediastinal contours are within normal limits. Tortuous aorta. No consolidation. No visible pleural effusions or pneumothorax. No acute osseous abnormality. Similar degenerative change of the left shoulder. IMPRESSION: No evidence of acute cardiopulmonary disease. Electronically Signed   By: Margaretha Sheffield MD   On: 09/11/2020 15:00   CT ABDOMEN PELVIS W CONTRAST  Result Date: 09/11/2020 CLINICAL DATA:  Abdominal pain, nausea, vomiting, constipation for 1 week. EXAM: CT ABDOMEN AND PELVIS WITH CONTRAST TECHNIQUE: Multidetector CT imaging of the abdomen and pelvis was performed using the standard  protocol following bolus administration of intravenous contrast. CONTRAST:  124mL OMNIPAQUE IOHEXOL 300 MG/ML  SOLN COMPARISON:  12/06/2019 FINDINGS: Lower chest: The lung bases are clear. Hepatobiliary: Diffuse fatty infiltration of the liver. Subcentimeter cysts. Gallbladder is surgically absent. No bile duct dilatation. Pancreas: Fatty replacement of the head and body of the pancreas. No acute inflammatory changes or loculated collections. Spleen: Normal in size without focal abnormality. Adrenals/Urinary Tract: No adrenal gland nodules. Multiple parenchymal cysts in the kidneys. Nephrograms are symmetrical. No hydronephrosis or hydroureter. Bladder is normal. Stomach/Bowel: Stomach, small bowel, and colon are decompressed. No abnormal distention. Partial right colectomy with ileocolonic anastomosis. Vascular/Lymphatic: Calcification of the aorta. Tortuous aorta without aneurysmal dilatation. Right iliac artery aneurysm measuring 1.8 cm diameter. Left iliac artery aneurysm measuring 1.7 cm diameter. No significant lymphadenopathy. Reproductive: Uterus and bilateral adnexa are unremarkable. Other: No free air or free fluid in the abdomen. Abdominal wall musculature appears intact. Musculoskeletal: Previous left hip arthroplasty. Degenerative changes in the spine and right hip. No destructive bone lesions. IMPRESSION: 1. No acute process demonstrated in the abdomen or pelvis. 2. Diffuse fatty infiltration of the liver. 3. Fatty replacement of the head and body of the pancreas. 4. Bilateral iliac artery aneurysms. 5. Aortic atherosclerosis. Aortic Atherosclerosis (ICD10-I70.0). Electronically Signed   By: Lucienne Capers M.D.   On: 09/11/2020 19:38    EKG: I independently viewed the EKG done and my findings are as followed: Sinus bradycardia at a rate of 53 bpm  Assessment/Plan Present on Admission: . GERD (gastroesophageal reflux disease) . Essential hypertension . Gout  Active Problems:   GERD  (gastroesophageal reflux disease)   Essential hypertension   Gout   Nausea & vomiting   Intractable abdominal pain   Acute kidney injury superimposed on CKD ([place])   Bilateral iliac artery aneurysm ([facility])   Hyperglycemia due to diabetes mellitus ([facility])   Constipation  Intractable abdominal pain R/O gastroparesis Nausea and vomiting Continue IV NS at 75 mLs/Hr Continue IV Fentanyl Continue IV Zofran p.r.n. Consider Reglan if patient does not respond to Zofran  Continue npo with plan to advance diet as tolerated  GERD Continue Protonix  Acute kidney injury on CKD 3B BUN/creatinine 15/1.54 (Baseline Creatinine 1.1 at 1.3) Renally adjust medications, avoid nephrotoxic agents/dehydration/hypotension  Hyperglycemia secondary to T2DM Continue ISS and hypoglycemic protocol  Incidental finding of bilateral iliac artery aneurysm CT abdomen and pelvis showed right iliac artery aneurysm measuring 1.8 cm and left iliac artery aneurysm measuring 1.7cm diameter Patient will need outpatient follow-up  Constipation Continue MiraLAX when patient resumes oral intake  Essential hypertension (controlled) Continue Norvasc when patient resumes intake  Gout Continue home medication when patient resumes oral intake   DVT prophylaxis: Lovenox  Code Status: Full code  Family Communication: None at bedside  Disposition Plan:  Patient is from:                        home Anticipated DC to:                   SNF or family members home Anticipated DC date:               2-3 days Anticipated DC barriers:           Patient requires inpatient management due to intractable abdominal pain with nausea and vomiting   Consults called: None  Admission status: Observation    Bernadette Hoit MD [facility]  09/12/2020, 7:21 AM

## 2020-09-12 NOTE — Progress Notes (Signed)
Pt requesting to try to eat foods and consume fluids with no issues before taking po meds Also worrying about her blood sugar level dropping - request to holding off on levemir at this time Education provided Will cont to monitor Tolerating clear liquids well at this time

## 2020-09-13 ENCOUNTER — Observation Stay (HOSPITAL_COMMUNITY): Payer: Medicare HMO

## 2020-09-13 DIAGNOSIS — R079 Chest pain, unspecified: Secondary | ICD-10-CM | POA: Diagnosis not present

## 2020-09-13 DIAGNOSIS — I129 Hypertensive chronic kidney disease with stage 1 through stage 4 chronic kidney disease, or unspecified chronic kidney disease: Secondary | ICD-10-CM | POA: Diagnosis not present

## 2020-09-13 DIAGNOSIS — I10 Essential (primary) hypertension: Secondary | ICD-10-CM | POA: Diagnosis not present

## 2020-09-13 DIAGNOSIS — N179 Acute kidney failure, unspecified: Secondary | ICD-10-CM | POA: Diagnosis not present

## 2020-09-13 DIAGNOSIS — R112 Nausea with vomiting, unspecified: Secondary | ICD-10-CM | POA: Diagnosis not present

## 2020-09-13 DIAGNOSIS — R0789 Other chest pain: Secondary | ICD-10-CM | POA: Diagnosis not present

## 2020-09-13 DIAGNOSIS — Z20822 Contact with and (suspected) exposure to covid-19: Secondary | ICD-10-CM | POA: Diagnosis not present

## 2020-09-13 DIAGNOSIS — E1122 Type 2 diabetes mellitus with diabetic chronic kidney disease: Secondary | ICD-10-CM | POA: Diagnosis not present

## 2020-09-13 LAB — GLUCOSE, CAPILLARY
Glucose-Capillary: 101 mg/dL — ABNORMAL HIGH (ref 70–99)
Glucose-Capillary: 107 mg/dL — ABNORMAL HIGH (ref 70–99)
Glucose-Capillary: 110 mg/dL — ABNORMAL HIGH (ref 70–99)
Glucose-Capillary: 138 mg/dL — ABNORMAL HIGH (ref 70–99)
Glucose-Capillary: 148 mg/dL — ABNORMAL HIGH (ref 70–99)
Glucose-Capillary: 265 mg/dL — ABNORMAL HIGH (ref 70–99)
Glucose-Capillary: 60 mg/dL — ABNORMAL LOW (ref 70–99)
Glucose-Capillary: 92 mg/dL (ref 70–99)

## 2020-09-13 LAB — COMPREHENSIVE METABOLIC PANEL
ALT: 13 U/L (ref 0–44)
AST: 20 U/L (ref 15–41)
Albumin: 3 g/dL — ABNORMAL LOW (ref 3.5–5.0)
Alkaline Phosphatase: 88 U/L (ref 38–126)
Anion gap: 7 (ref 5–15)
BUN: 14 mg/dL (ref 8–23)
CO2: 25 mmol/L (ref 22–32)
Calcium: 9.2 mg/dL (ref 8.9–10.3)
Chloride: 107 mmol/L (ref 98–111)
Creatinine, Ser: 1.31 mg/dL — ABNORMAL HIGH (ref 0.44–1.00)
GFR, Estimated: 39 mL/min — ABNORMAL LOW (ref >60)
Glucose, Bld: 123 mg/dL — ABNORMAL HIGH (ref 70–99)
Potassium: 4.4 mmol/L (ref 3.5–5.1)
Sodium: 139 mmol/L (ref 135–145)
Total Bilirubin: 1.4 mg/dL — ABNORMAL HIGH (ref 0.3–1.2)
Total Protein: 6 g/dL — ABNORMAL LOW (ref 6.5–8.1)

## 2020-09-13 LAB — CBC
HCT: 32.6 % — ABNORMAL LOW (ref 36.0–46.0)
Hemoglobin: 10.6 g/dL — ABNORMAL LOW (ref 12.0–15.0)
MCH: 29.6 pg (ref 26.0–34.0)
MCHC: 32.5 g/dL (ref 30.0–36.0)
MCV: 91.1 fL (ref 80.0–100.0)
Platelets: 163 10*3/uL (ref 150–400)
RBC: 3.58 MIL/uL — ABNORMAL LOW (ref 3.87–5.11)
RDW: 14.3 % (ref 11.5–15.5)
WBC: 5.1 10*3/uL (ref 4.0–10.5)
nRBC: 0 % (ref 0.0–0.2)

## 2020-09-13 LAB — TROPONIN I (HIGH SENSITIVITY)
Troponin I (High Sensitivity): 4 ng/L (ref <18)
Troponin I (High Sensitivity): 5 ng/L (ref <18)

## 2020-09-13 LAB — URINE CULTURE: Culture: NO GROWTH

## 2020-09-13 MED ORDER — OXYCODONE HCL 5 MG PO TABS
5.0000 mg | ORAL_TABLET | Freq: Four times a day (QID) | ORAL | Status: DC | PRN
  Administered 2020-09-14: 5 mg via ORAL
  Filled 2020-09-13 (×2): qty 1

## 2020-09-13 MED ORDER — NITROGLYCERIN 0.4 MG SL SUBL: 0.4000 mg | SUBLINGUAL_TABLET | SUBLINGUAL | Status: DC | PRN

## 2020-09-13 MED ORDER — MAGIC MOUTHWASH
10.0000 mL | Freq: Three times a day (TID) | ORAL | Status: DC
  Administered 2020-09-13 – 2020-09-14 (×4): 10 mL via ORAL
  Filled 2020-09-13 (×4): qty 10

## 2020-09-13 MED ORDER — METOCLOPRAMIDE HCL 10 MG PO TABS
5.0000 mg | ORAL_TABLET | Freq: Three times a day (TID) | ORAL | Status: DC
  Administered 2020-09-13 – 2020-09-14 (×4): 5 mg via ORAL
  Filled 2020-09-13 (×4): qty 1

## 2020-09-13 NOTE — Care Management Obs Status (Signed)
[facility] NOTIFICATION   Patient Details  Name: Traci Clark MRN: [id number] Date of Birth: 12/13/1930   Medicare Observation Status Notification Given:  Yes    [name] 09/13/2020, 10:51 AM

## 2020-09-13 NOTE — Consult Note (Signed)
Cardiology Consultation:   Patient ID: Traci Clark MRN: [id number]; DOB: [birth year]  Admit date: 09/11/2020 Date of Consult: 09/13/2020  PCP:  Lavella Lemons, [place]  Cardiologist:  No primary care provider on file. Advanced Practice Provider:  No care team member to display Electrophysiologist:  None   Patient Profile:   Traci Clark is a 87 y.o. female with a hx of HTN, T2DM, GERD, gout and constipation who is being seen today for the evaluation of chest pain at the request of Bonnielee Haff, MD.  History of Present Illness:   [name] presented to the hospital with complaints of abdominal pain, nausea and vomiting. She states that she has had a poor appetite for about a month. She has lost about 5 lbs in weight. She feels she cannot bring herself to eat anything. Has had episodes at night where she wakes up sweating profusely and winded. But the symptoms improve when she is awake. She also complains of chest heaviness at rest. Symptoms occur off and on (mostly at night). No radiation of the discomfort. Chest heaviness is non-exertional and lasts for hours. She is extremely vague on precipitating factors. No orthopnea, PND or leg edema. She denies palpitations, pre-syncope or syncope.  She is status post appendectomy, cholecystectomy, partial colon resection after having been diagnosed with a colonic mass in the past.  In the ED, hs-troponins were negative x2. ECG with sinus rhythm, rate 53 bpm and incomplete LBBB. CXR was unremarkable.   Past Medical History:  Diagnosis Date  . Anxiety   . Arthritis   . Chronic constipation   . Colon cancer ([place])    colon  . Complication of anesthesia   . Diabetes mellitus without complication ([place])   . DVT (deep vein thrombosis) in pregnancy   . Dysphagia   . GERD (gastroesophageal reflux disease)   . Glaucoma   . Gout   . H/O hiatal hernia   . History of kidney stones   . MI (myocardial  infarction) ([place]) 1993  . PONV (postoperative nausea and vomiting)   . Restless leg syndrome   . Seizure ([place])    withdrawal from xanax  . Vertigo     Past Surgical History:  Procedure Laterality Date  . BIOPSY  07/21/2020   Procedure: BIOPSY;  Surgeon: Harvel Quale, MD;  [facility];  Service: Gastroenterology;;  . CHOLECYSTECTOMY    . COLECTOMY  2003  . COLONOSCOPY N/A 07/29/2012   Procedure: COLONOSCOPY;  Surgeon: Rogene Houston, MD;  [facility];  Service: Endoscopy;  Laterality: N/A;  100  . COLONOSCOPY WITH PROPOFOL N/A 12/13/2016   Procedure: COLONOSCOPY WITH PROPOFOL;  Surgeon: Rogene Houston, MD;  [facility];  Service: Endoscopy;  Laterality: N/A;  8:30  . COLONOSCOPY WITH PROPOFOL N/A 07/21/2020   Procedure: COLONOSCOPY WITH PROPOFOL;  Surgeon: Harvel Quale, MD;  [facility];  Service: Gastroenterology;  Laterality: N/A;  AM  . ESOPHAGEAL DILATION  11/03/2015   Procedure: ESOPHAGEAL DILATION;  Surgeon: Rogene Houston, MD;  [facility];  Service: Endoscopy;;  . ESOPHAGEAL DILATION N/A 09/26/2017   Procedure: ESOPHAGEAL DILATION;  Surgeon: Rogene Houston, MD;  [facility];  Service: Endoscopy;  Laterality: N/A;  . ESOPHAGOGASTRODUODENOSCOPY N/A 03/25/2014   Procedure: ESOPHAGOGASTRODUODENOSCOPY (EGD);  Surgeon: Rogene Houston, MD;  [facility];  Service: Endoscopy;  Laterality: N/A;  1055  .  ESOPHAGOGASTRODUODENOSCOPY N/A 11/03/2015   Procedure: ESOPHAGOGASTRODUODENOSCOPY (EGD);  Surgeon: Rogene Houston, MD;  [facility];  Service: Endoscopy;  Laterality: N/A;  1:55 - moved to 11:15 - Ann to notify - moved to 12:45 - pt knows to arrive at 11:45  . ESOPHAGOGASTRODUODENOSCOPY N/A 09/26/2017   Procedure: ESOPHAGOGASTRODUODENOSCOPY (EGD);  Surgeon: Rogene Houston, MD;  [facility];  Service: Endoscopy;  Laterality: N/A;  . ESOPHAGOGASTRODUODENOSCOPY  (EGD) WITH PROPOFOL N/A 07/21/2020   Procedure: ESOPHAGOGASTRODUODENOSCOPY (EGD) WITH PROPOFOL;  Surgeon: Harvel Quale, MD;  [facility];  Service: Gastroenterology;  Laterality: N/A;  . MALONEY DILATION N/A 03/25/2014   Procedure: Venia Minks DILATION;  Surgeon: Rogene Houston, MD;  [facility];  Service: Endoscopy;  Laterality: N/A;  . POLYPECTOMY  12/13/2016   Procedure: POLYPECTOMY;  Surgeon: Rogene Houston, MD;  [facility];  Service: Endoscopy;;  polyp  . POLYPECTOMY  07/21/2020   Procedure: POLYPECTOMY;  Surgeon: Montez Morita, Quillian Quince, MD;  [facility];  Service: Gastroenterology;;  . TOTAL HIP ARTHROPLASTY Left   . TUBAL LIGATION    . VEIN SURGERY Bilateral    stripped vein due to DVT     Home Medications:  Prior to Admission medications   Medication Sig Start Date End Date Taking? Authorizing Provider  acetaminophen (TYLENOL) 325 MG tablet Take 650 mg by mouth every 6 (six) hours as needed for pain.   Yes [provider]  allopurinol (ZYLOPRIM) 300 MG tablet Take 300 mg by mouth daily.   Yes [provider]  ALPRAZolam (XANAX) 0.5 MG tablet Take 1 tablet (0.5 mg total) by mouth 3 (three) times daily as needed. Patient taking differently: Take 0.5 mg by mouth 3 (three) times daily as needed for anxiety. 10/01/17  Yes Barton Dubois, MD  amLODipine (NORVASC) 5 MG tablet Take 5 mg by mouth daily.   Yes [provider]  aspirin 81 MG tablet Take 1 tablet (81 mg total) by mouth daily. 11/04/15  Yes Rehman, Mechele Dawley, MD  BIOTIN PO Take 1 capsule by mouth daily.   Yes [provider]  colchicine 0.6 MG tablet Take 0.6 mg by mouth daily as needed (gout).   Yes [provider]  fluticasone (VERAMYST) 27.5 MCG/SPRAY nasal spray Place 2 sprays into the nose daily.   Yes [provider]  furosemide (LASIX) 20 MG tablet Take 20 mg by mouth daily.   Yes [provider]  insulin  aspart (NOVOLOG FLEXPEN RELION) 100 UNIT/ML FlexPen Inject 4-10 Units into the skin 3 (three) times daily with meals. Per sliding scale   Yes [provider]  Insulin Glargine (BASAGLAR KWIKPEN [place]) Inject 12 Units into the skin at bedtime.   Yes [provider]  mirtazapine (REMERON) 15 MG tablet Take 15 mg by mouth at bedtime.   Yes [provider]  Multiple Vitamins-Minerals (MULTIVITAMIN WITH MINERALS) tablet Take 1 tablet by mouth daily.   Yes [provider]  nystatin (MYCOSTATIN) 100000 UNIT/ML suspension Take 5 mLs by mouth 4 (four) times daily. 09/06/20  Yes [provider]  pantoprazole (PROTONIX) 40 MG tablet Take 40 mg by mouth daily.   Yes [provider]  polyethylene glycol (MIRALAX / GLYCOLAX) packet Take 17 g by mouth daily.   Yes [provider]  potassium chloride (KLOR-CON) 10 MEQ tablet Take 10 mEq by mouth daily.   Yes [provider]  traMADol (ULTRAM) 50 MG tablet Take  50 mg by mouth every 6 (six) hours as needed for severe pain.   Yes [provider]  Travoprost, BAK Free, (TRAVATAN) 0.004 % SOLN ophthalmic solution Place 1 drop into both eyes at bedtime.   Yes [provider]  lubiprostone (AMITIZA) 8 MCG capsule Take 1 capsule (8 mcg total) by mouth 2 (two) times daily with a meal. Patient not taking: Reported on 09/11/2020 07/06/20   Harvel Quale, MD  meclizine (ANTIVERT) 25 MG tablet Take 25 mg by mouth 3 (three) times daily as needed for dizziness.  Patient not taking: Reported on 09/11/2020    [provider]  ondansetron (ZOFRAN-ODT) 8 MG disintegrating tablet Take 1 tablet by mouth every 6 (six) hours as needed for nausea or vomiting.  Patient not taking: Reported on 09/11/2020 10/18/15   [provider]    Inpatient Medications: Scheduled Meds: . acetaminophen  650 mg Oral Once  . amLODipine  5 mg Oral Daily  . aspirin  81 mg Oral Daily  .  enoxaparin (LOVENOX) injection  30 mg Subcutaneous Q24H  . insulin aspart  0-9 Units Subcutaneous Q4H  . insulin glargine  6 Units Subcutaneous Daily  . magic mouthwash  10 mL Oral TID  . metoCLOPramide  5 mg Oral TID AC   Continuous Infusions:  PRN Meds: ALPRAZolam, fentaNYL (SUBLIMAZE) injection, nitroGLYCERIN, ondansetron (ZOFRAN) IV, polyethylene glycol  Allergies:    Allergies  Allergen Reactions  . Amoxicillin Anaphylaxis and Rash    "Skin peeled off"  . Macrobid [Nitrofurantoin Monohyd Macro] Anaphylaxis, Rash and Other (See Comments)    "Skin peeled off"  . Codeine Nausea And Vomiting and Other (See Comments)    shaking  . Darvon [Propoxyphene] Nausea And Vomiting and Other (See Comments)    shaking    Social History:   Social History   Socioeconomic History  . Marital status: Married    Spouse name: Not on file  . Number of children: Not on file  . Years of education: Not on file  . Highest education level: Not on file  Occupational History  . Not on file  Tobacco Use  . Smoking status: Never Smoker  . Smokeless tobacco: Never Used  Vaping Use  . Vaping Use: Never used  Substance and Sexual Activity  . Alcohol use: No    Alcohol/week: 0.0 standard drinks  . Drug use: No  . Sexual activity: Not Currently    Birth control/protection: Surgical  Other Topics Concern  . Not on file  Social History Narrative  . Not on file   Social Determinants of Health   Financial Resource Strain: Not on file  Food Insecurity: Not on file  Transportation Needs: Not on file  Physical Activity: Not on file  Stress: Not on file  Social Connections: Not on file  Intimate Partner Violence: Not on file    Family History:   History reviewed. No pertinent family history.   ROS:  Please see the history of present illness.  All other ROS reviewed and negative.     Physical Exam/Data:   Vitals:   09/12/20 1327 09/12/20 2124 09/13/20 0333 09/13/20 0744  BP: 131/63 (!)  125/55 (!) 117/53 139/66  Pulse: (!) 53 60 (!) 51 61  Resp: 16 18 20 18   Temp: 98.4 F (36.9 C) 98.6 F (37 C) 98 F (36.7 C) 97.9 F (36.6 C)  TempSrc: Oral Oral  Oral  SpO2: 95% 93% 96% 98%  Weight:  Height:        Intake/Output Summary (Last 24 hours) at 09/13/2020 1253 Last data filed at 09/13/2020 0800 Gross per 24 hour  Intake 832.5 ml  Output --  Net 832.5 ml   Last 3 Weights 09/12/2020 09/11/2020 07/19/2020  Weight (lbs) 156 lb 8.4 oz 154 lb 159 lb  Weight (kg) 71 kg 69.854 kg 72.122 kg     Body mass index is 25.26 kg/m.  General:  Well nourished, well developed, in no acute distress HEENT: normal Lymph: no adenopathy Neck: no JVD Endocrine:  No thryomegaly Vascular: No carotid bruits; FA pulses 2+ bilaterally without bruits  Cardiac:  normal S1, S2; RRR; no murmur  Lungs:  clear to auscultation bilaterally, no wheezing, rhonchi or rales  Abd: soft, nontender, no hepatomegaly  Ext: no edema Musculoskeletal:  No deformities, BUE and BLE strength normal and equal Skin: warm and dry  Neuro:  CNs 2-12 intact, no focal abnormalities noted Psych:  Normal affect    Laboratory Data:  High Sensitivity Troponin:   Recent Labs  Lab 09/11/20 1559 09/13/20 0542 09/13/20 0947  TROPONINIHS 8 4 5      Chemistry Recent Labs  Lab 09/11/20 1559 09/12/20 0651 09/13/20 0542  NA 134* 138 139  K 4.1 4.4 4.4  CL 98 105 107  CO2 25 26 25   GLUCOSE 119* 124* 123*  BUN 15 14 14   CREATININE 1.54* 1.36* 1.31*  CALCIUM 9.9 9.5 9.2  GFRNONAA 32* 38* 39*  ANIONGAP 11 7 7     Recent Labs  Lab 09/11/20 1559 09/12/20 0651 09/13/20 0542  PROT 8.0 6.6 6.0*  ALBUMIN 4.0 3.3* 3.0*  AST 29 23 20   ALT 17 14 13   ALKPHOS 126 104 88  BILITOT 1.4* 1.4* 1.4*   Hematology Recent Labs  Lab 09/11/20 1559 09/12/20 0651 09/13/20 0542  WBC 6.0 6.0 5.1  RBC 4.43 3.96 3.58*  HGB 13.0 11.6* 10.6*  HCT 39.3 35.9* 32.6*  MCV 88.7 90.7 91.1  MCH 29.3 29.3 29.6  MCHC 33.1 32.3  32.5  RDW 14.1 14.3 14.3  PLT 193 149* 163   BNPNo results for input(s): BNP, PROBNP in the last 168 hours.  DDimer No results for input(s): DDIMER in the last 168 hours.   Radiology/Studies:  DG Chest 2 View  Result Date: 09/11/2020 CLINICAL DATA:  Chest pain. EXAM: CHEST - 2 VIEW COMPARISON:  July 1921. FINDINGS: The heart size and mediastinal contours are within normal limits. Tortuous aorta. No consolidation. No visible pleural effusions or pneumothorax. No acute osseous abnormality. Similar degenerative change of the left shoulder. IMPRESSION: No evidence of acute cardiopulmonary disease. Electronically Signed   By: Margaretha Sheffield MD   On: 09/11/2020 15:00   CT ABDOMEN PELVIS W CONTRAST  Result Date: 09/11/2020 CLINICAL DATA:  Abdominal pain, nausea, vomiting, constipation for 1 week. EXAM: CT ABDOMEN AND PELVIS WITH CONTRAST TECHNIQUE: Multidetector CT imaging of the abdomen and pelvis was performed using the standard protocol following bolus administration of intravenous contrast. CONTRAST:  133mL OMNIPAQUE IOHEXOL 300 MG/ML  SOLN COMPARISON:  12/06/2019 FINDINGS: Lower chest: The lung bases are clear. Hepatobiliary: Diffuse fatty infiltration of the liver. Subcentimeter cysts. Gallbladder is surgically absent. No bile duct dilatation. Pancreas: Fatty replacement of the head and body of the pancreas. No acute inflammatory changes or loculated collections. Spleen: Normal in size without focal abnormality. Adrenals/Urinary Tract: No adrenal gland nodules. Multiple parenchymal cysts in the kidneys. Nephrograms are symmetrical. No hydronephrosis or hydroureter. Bladder is normal. Stomach/Bowel:  Stomach, small bowel, and colon are decompressed. No abnormal distention. Partial right colectomy with ileocolonic anastomosis. Vascular/Lymphatic: Calcification of the aorta. Tortuous aorta without aneurysmal dilatation. Right iliac artery aneurysm measuring 1.8 cm diameter. Left iliac artery aneurysm  measuring 1.7 cm diameter. No significant lymphadenopathy. Reproductive: Uterus and bilateral adnexa are unremarkable. Other: No free air or free fluid in the abdomen. Abdominal wall musculature appears intact. Musculoskeletal: Previous left hip arthroplasty. Degenerative changes in the spine and right hip. No destructive bone lesions. IMPRESSION: 1. No acute process demonstrated in the abdomen or pelvis. 2. Diffuse fatty infiltration of the liver. 3. Fatty replacement of the head and body of the pancreas. 4. Bilateral iliac artery aneurysms. 5. Aortic atherosclerosis. Aortic Atherosclerosis (ICD10-I70.0). Electronically Signed   By: Lucienne Capers M.D.   On: 09/11/2020 19:38     Assessment and Plan:   1.  Intermittent chest pain. The patient describes chest pain with atypical features. She does however have risk factors for CAD (such as HTN, DM, age). The hs-troponins are 4 and 5. At the time of this assessment she was still reporting constant chest heaviness while lying in bed. The ECG does not show any ST elevations or depressions. There is an incomplete LBBB morphology / IVCD in one of the electrocardiograms. Also in some tracings there are T wave inversions in the anterolateral leads with a narrower QRS.  - Monitor on telemetry for any bradycardiac episodes or sinus pauses - Daily ECGs - Maintain serum K > 4.0 and Mg > 2.0 - Transthoracic echocardiogram to evaluate LV function   For questions or updates, please contact [facility] Please consult [email or web address] for contact info under    Signed, Meade Maw, MD  09/13/2020 12:53 PM

## 2020-09-13 NOTE — TOC Initial Note (Signed)
Transition of Care [facility]) - Initial/Assessment Note    Patient Details  Name: Traci Clark MRN: [id number] Date of Birth: October 27, 1930  Transition of Care [facility]) CM/SW Contact:    Iona Beard, [place] Phone Number: 09/13/2020, 10:16 AM  Clinical Narrative:                 CSW reached out to pt to complete assessment due to PT recommending HHPT. CSW spoke with pt to complete assessment. Pt states that she lives with her husband and grandson. Pt is independent in completing her ADLs. Pt states that she does not drive but has transportation provided by family members when needed. Pt states she has had [place] services in the past but is unsure of what company she used. Pt states she uses a walker daily in her home and a rollator when she is going somewhere. Pt does not wear home O2. CSW informed pt of PT recommendation for HHPT, pt states she feels she can manage at home without [facility]. She would not like [place] services set up. Pt understands that once d/c she can reach out to her PCP if she is interested in starting [facility] services. TOC to follow.   Expected Discharge Plan: Home/Self Care Barriers to Discharge: Continued Medical Work up   Patient Goals and CMS Choice Patient states their goals for this hospitalization and ongoing recovery are:: Go home CMS Medicare.gov Compare Post Acute Care list provided to:: Patient Choice offered to / list presented to : Patient  Expected Discharge Plan and Services Expected Discharge Plan: Home/Self Care In-house Referral: NA Discharge Planning Services: CM Consult Post Acute Care Choice: NA Living arrangements for the past 2 months: Single Family Home                 DME Arranged: N/A DME Agency: NA       HH Arranged: NA HH Agency: NA        Prior Living Arrangements/Services Living arrangements for the past 2 months: Single Family Home Lives with:: Spouse,Relatives Patient language and need for interpreter reviewed:: Yes Do you feel safe going  back to the place where you live?: Yes      Need for Family Participation in Patient Care: No (Comment) Care giver support system in place?: Yes (comment) Current home services: DME Criminal Activity/Legal Involvement Pertinent to Current Situation/Hospitalization: No - Comment as needed  Activities of Daily Living Home Assistive Devices/Equipment: Eyeglasses,Walker (specify type),Cane (specify quad or straight) ADL Screening (condition at time of admission) Patient's cognitive ability adequate to safely complete daily activities?: Yes Is the patient deaf or have difficulty hearing?: Yes Does the patient have difficulty seeing, even when wearing glasses/contacts?: No Does the patient have difficulty concentrating, remembering, or making decisions?: No Patient able to express need for assistance with ADLs?: Yes Does the patient have difficulty dressing or bathing?: No Independently performs ADLs?: Yes (appropriate for developmental age) Does the patient have difficulty walking or climbing stairs?: Yes Weakness of Legs: Both Weakness of Arms/Hands: None  Permission Sought/Granted                  Emotional Assessment Appearance:: Appears stated age Attitude/Demeanor/Rapport: Engaged Affect (typically observed): Accepting Orientation: : Oriented to Self,Oriented to Place,Oriented to  Time,Oriented to Situation Alcohol / Substance Use: Not Applicable Psych Involvement: No (comment)  Admission diagnosis:  AKI (acute kidney injury) ([place]) [N17.9] Intractable nausea and vomiting [R11.2] Intractable vomiting with nausea, unspecified vomiting type [R11.2] Patient Active Problem List  Diagnosis Date Noted  . Intractable nausea and vomiting 09/12/2020  . Intractable abdominal pain 09/12/2020  . Acute kidney injury superimposed on CKD ([place]) 09/12/2020  . Bilateral iliac artery aneurysm ([place]) 09/12/2020  . Hyperglycemia due to diabetes mellitus ([place]) 09/12/2020  . Constipation  09/12/2020  . IBS (irritable colon syndrome) 07/06/2020  . Melena 07/06/2020  . Rectal bleeding 07/06/2020  . Generalized abdominal pain   . Diabetes ([place]) 09/24/2017  . Essential hypertension 09/24/2017  . Gout 09/24/2017  . Anxiety 09/24/2017  . Nausea & vomiting 09/24/2017  . Pancreatitis 09/24/2017  . History of colon cancer 10/25/2016  . Dysphagia, pharyngoesophageal phase 06/27/2014  . GERD (gastroesophageal reflux disease) 03/23/2014  . RLS (restless legs syndrome) 03/23/2014  . Colon cancer ([place]) 07/14/2012   PCP:  Lavella Lemons, PA Pharmacy:   [id number] - [place], [place] [place] [place] [address] Phone: [phone] Fax: [phone]     Social Determinants of Health (SDOH) Interventions    Readmission Risk Interventions No flowsheet data found.

## 2020-09-13 NOTE — Progress Notes (Signed)
Physical Therapy Treatment Patient Details Name: Traci Clark MRN: [id number] DOB: 02/10/1931 Today's Date: 09/13/2020    History of Present Illness Traci Clark is a 87 y.o. female with c/o abdominal pain with nausea and vomitting. PMH: colon cancer, MI, diabetes, GERD, gout, constipation , L THA    PT Comments    Patient does not require assist for bed mobility and demonstrates good sitting balance EOB. Patient transfers to standing with RW and is minimally unsteady upon standing. Patient ambulates with slow cadence with RW without loss of balance. She is given cueing for ambulating within RW due to tendency to push walker ahead. Patient also given cueing for using RW until she gets where she is going as she wanted to leave RW by sink and ambulate back to bed without it despite unsteadiness. Patient seated EOB with family present at end of session. Patient will benefit from continued physical therapy in hospital and recommended venue below to increase strength, balance, endurance for safe ADLs and gait.   Follow Up Recommendations  Home health PT     Equipment Recommendations  None recommended by PT    Recommendations for Other Services       Precautions / Restrictions Precautions Precautions: Fall Restrictions Weight Bearing Restrictions: No    Mobility  Bed Mobility Overal bed mobility: Modified Independent                  Transfers Overall transfer level: Needs assistance     Sit to Stand: Supervision         General transfer comment: BUE assisting to power up with RW, initially slightly unsteady  Ambulation/Gait Ambulation/Gait assistance: Supervision Gait Distance (Feet): 75 Feet Assistive device: Rolling walker (2 wheeled) Gait Pattern/deviations: Step-through pattern;Decreased stride length;Trunk flexed Gait velocity: decreased   General Gait Details: cueing for proper RW use   Stairs             Wheelchair Mobility     Modified Rankin (Stroke Patients Only)       Balance Overall balance assessment: Needs assistance Sitting-balance support: Feet supported Sitting balance-Leahy Scale: Good Sitting balance - Comments: seated EOB   Standing balance support: During functional activity;Bilateral upper extremity supported Standing balance-Leahy Scale: Fair Standing balance comment: reliant on UE support                            Cognition Arousal/Alertness: Awake/alert Behavior During Therapy: WFL for tasks assessed/performed Overall Cognitive Status: Within Functional Limits for tasks assessed                                        Exercises      General Comments        Pertinent Vitals/Pain Pain Assessment: No/denies pain    Home Living                      Prior Function            PT Goals (current goals can now be found in the care plan section) Acute Rehab PT Goals Patient Stated Goal: return home with family to assist PT Goal Formulation: With patient/family Time For Goal Achievement: 09/26/20 Potential to Achieve Goals: Good Progress towards PT goals: Progressing toward goals    Frequency    Min 3X/week      PT  Plan Current plan remains appropriate    Co-evaluation              AM-PAC PT "6 Clicks" Mobility   Outcome Measure  Help needed turning from your back to your side while in a flat bed without using bedrails?: None Help needed moving from lying on your back to sitting on the side of a flat bed without using bedrails?: None Help needed moving to and from a bed to a chair (including a wheelchair)?: A Little Help needed standing up from a chair using your arms (e.g., wheelchair or bedside chair)?: A Little Help needed to walk in hospital room?: A Little Help needed climbing 3-5 steps with a railing? : A Little 6 Click Score: 20    End of Session Equipment Utilized During Treatment: Gait belt Activity  Tolerance: Patient tolerated treatment well Patient left: with call bell/phone within reach;with family/visitor present;in bed Nurse Communication: Mobility status PT Visit Diagnosis: Other abnormalities of gait and mobility (R26.89)     Time: 4696-2952 PT Time Calculation (min) (ACUTE ONLY): 9 min  Charges:  $Therapeutic Activity: 8-22 mins                    2:58 PM, 09/13/20 Mearl Latin PT, DPT Physical Therapist at [facility]

## 2020-09-13 NOTE — Progress Notes (Signed)
PROGRESS NOTE    Traci Clark  WUJ:[id number] DOB: 07/05/1930 DOA: 09/11/2020 PCP: Lavella Lemons, PA    Brief Narrative:  87 y.o. female with medical history significant for hypertension, T2DM, GERD, gout and constipation who presents presents to the emergency department due to 1 week onset of abdominal pain which is associated with nausea and vomiting.  Abdominal pain was diffuse but worse in lower quadrants, it was persistent and crampy in nature.  It was rated at 9/10 (max) and 5-6/10 (minimal).  No alleviating or aggravating factor known.  She states that she has been to her PCP twice without improvement in the abdominal pain, so she was asked to go to the ED for further evaluation.  She denies fever, chills, chest pain, shortness of breath.  Last bowel movement was 4/24  Assessment & Plan:  Chest pain This morning patient is complaining of pressure over the retrosternal area.  Onset was earlier this morning.  Has not really ambulated to know if this gets worse with exertion or not.  Denies any shortness of breath.  Has not had similar symptoms in the past.  Denies any history of heart disease.  EKG was done which showed does show some new T wave inversions in the lateral leads.  Troponins were checked which were reassuringly normal.  Patient already on aspirin.  We will place her on telemetry.  We will request cardiology consultation.  Patient may need further cardiac work-up.  Intractable abdominal pain R/O gastroparesis  This was her reason for presentation.  CT abdomen did not reveal any acute findings.  Patient with history of diabetes and there was some concern that she may have gastroparesis.  She was started on Reglan with improvement in symptoms.  Changed to oral Reglan today.  Change to oral PPI.  GERD Continue Protonix as tolerated  Acute kidney injury on CKD 3B Baseline Creatinine 1.1.  Presented with creatinine of 1.5.  She was given gentle IV hydration with  improvement.  Okay to stop IV fluids now.  Hyperglycemia secondary to T2DM Currently on half of her usual dose of Lantus.  Continue SSI.  HbA1c 7.5.    Incidental finding of bilateral iliac artery aneurysm CT abdomen and pelvis showed right iliac artery aneurysm measuring 1.8 cm and left iliac artery aneurysm measuring 1.7cm diameter Recommend outpatient follow up  Constipation Will continue on PRN miralax  Essential hypertension (controlled) Reasonably well controlled.  Gout Continue home medication when patient resumes oral intake   DVT prophylaxis: Lovenox subq Code Status: Full Family Communication: Pt in room, family not at bedside Disposition: Hopefully return home when improved.  Status is: Observation  The patient remains OBS appropriate and will d/c before 2 midnights.  Dispo: The patient is from: Home              Anticipated d/c is to: Home              Patient currently is not medically stable to d/c.   Difficult to place patient No     Consultants:   Cardiology  Procedures:   None yet  Antimicrobials: Anti-infectives (From admission, onward)   None      Subjective: Complains of pressure over the chest area, 5-10 in intensity.  No radiation.  Objective: Vitals:   09/12/20 1327 09/12/20 2124 09/13/20 0333 09/13/20 0744  BP: 131/63 (!) 125/55 (!) 117/53 139/66  Pulse: (!) 53 60 (!) 51 61  Resp: 16 18 20 18   Temp: 98.4  F (36.9 C) 98.6 F (37 C) 98 F (36.7 C) 97.9 F (36.6 C)  TempSrc: Oral Oral  Oral  SpO2: 95% 93% 96% 98%  Weight:      Height:        Intake/Output Summary (Last 24 hours) at 09/13/2020 1225 Last data filed at 09/13/2020 0800 Gross per 24 hour  Intake 832.5 ml  Output --  Net 832.5 ml   Filed Weights   09/11/20 1349 09/12/20 0200  Weight: 69.9 kg 71 kg    Examination:  General appearance: Awake alert.  In no distress Resp: Clear to auscultation bilaterally.  Normal effort Cardio: S1-S2 is normal  regular.  No S3-S4.  No rubs murmurs or bruit GI: Abdomen is soft.  Nontender nondistended.  Bowel sounds are present normal.  No masses organomegaly Extremities: No edema.  Full range of motion of lower extremities. Neurologic: Alert and oriented x3.  No focal neurological deficits.    Data Reviewed: I have personally reviewed following labs and imaging studies  CBC: Recent Labs  Lab 09/11/20 1559 09/12/20 0651 09/13/20 0542  WBC 6.0 6.0 5.1  NEUTROABS 2.8  --   --   HGB 13.0 11.6* 10.6*  HCT 39.3 35.9* 32.6*  MCV 88.7 90.7 91.1  PLT 193 149* 846   Basic Metabolic Panel: Recent Labs  Lab 09/11/20 1559 09/12/20 0651 09/13/20 0542  NA 134* 138 139  K 4.1 4.4 4.4  CL 98 105 107  CO2 25 26 25   GLUCOSE 119* 124* 123*  BUN 15 14 14   CREATININE 1.54* 1.36* 1.31*  CALCIUM 9.9 9.5 9.2  MG  --  1.7  --   PHOS  --  2.9  --    GFR: Estimated Creatinine Clearance: 28.3 mL/min (A) (by C-G formula based on SCr of 1.31 mg/dL (H)). Liver Function Tests: Recent Labs  Lab 09/11/20 1559 09/12/20 0651 09/13/20 0542  AST 29 23 20   ALT 17 14 13   ALKPHOS 126 104 88  BILITOT 1.4* 1.4* 1.4*  PROT 8.0 6.6 6.0*  ALBUMIN 4.0 3.3* 3.0*   Recent Labs  Lab 09/11/20 1559  LIPASE 31   Coagulation Profile: Recent Labs  Lab 09/12/20 0651  INR 1.2   HbA1C: Recent Labs    09/12/20 0651  HGBA1C 7.5*   CBG: Recent Labs  Lab 09/12/20 2122 09/13/20 0010 09/13/20 0356 09/13/20 0734 09/13/20 1130  GLUCAP 129* 92 101* 138* 148*     Recent Results (from the past 240 hour(s))  Urine culture     Status: None   Collection Time: 09/11/20  5:50 PM   Specimen: Urine, Clean Catch  Result Value Ref Range Status   Specimen Description   Final    URINE, CLEAN CATCH Performed at [facility], [address], [place], [place] [address]    Special Requests   Final    NONE Performed at [facility], [address], [place], [place] [address]    Culture   Final    NO  GROWTH Performed at [facility], [place] [address] [place], [place] [address]    Report Status 09/13/2020 FINAL  Final  Resp Panel by RT-PCR (Flu A&B, Covid) Nasopharyngeal Swab     Status: None   Collection Time: 09/11/20 11:45 PM   Specimen: Nasopharyngeal Swab; Nasopharyngeal(NP) swabs in vial transport medium  Result Value Ref Range Status   SARS Coronavirus 2 by RT PCR NEGATIVE NEGATIVE Final    Comment: (NOTE) SARS-CoV-2 target nucleic acids are  NOT DETECTED.  The SARS-CoV-2 RNA is generally detectable in upper respiratory specimens during the acute phase of infection. The lowest concentration of SARS-CoV-2 viral copies this assay can detect is 138 copies/mL. A negative result does not preclude SARS-Cov-2 infection and should not be used as the sole basis for treatment or other patient management decisions. A negative result may occur with  improper specimen collection/handling, submission of specimen other than nasopharyngeal swab, presence of viral mutation(s) within the areas targeted by this assay, and inadequate number of viral copies(<138 copies/mL). A negative result must be combined with clinical observations, patient history, and epidemiological information. The expected result is Negative.  Fact Sheet for Patients:  EntrepreneurPulse.com.au  Fact Sheet for Healthcare Providers:  IncredibleEmployment.be  This test is no t yet approved or cleared by the [place] FDA and  has been authorized for detection and/or diagnosis of SARS-CoV-2 by FDA under an Emergency Use Authorization (EUA). This EUA will remain  in effect (meaning this test can be used) for the duration of the COVID-19 declaration under Section 564(b)(1) of the Act, 21 U.S.C.section 360bbb-3(b)(1), unless the authorization is terminated  or revoked sooner.       Influenza A by PCR NEGATIVE NEGATIVE Final   Influenza B by PCR NEGATIVE NEGATIVE Final     Comment: (NOTE) The Xpert Xpress SARS-CoV-2/FLU/RSV plus assay is intended as an aid in the diagnosis of influenza from Nasopharyngeal swab specimens and should not be used as a sole basis for treatment. Nasal washings and aspirates are unacceptable for Xpert Xpress SARS-CoV-2/FLU/RSV testing.  Fact Sheet for Patients: EntrepreneurPulse.com.au  Fact Sheet for Healthcare Providers: IncredibleEmployment.be  This test is not yet approved or cleared by the [place] FDA and has been authorized for detection and/or diagnosis of SARS-CoV-2 by FDA under an Emergency Use Authorization (EUA). This EUA will remain in effect (meaning this test can be used) for the duration of the COVID-19 declaration under Section 564(b)(1) of the Act, 21 U.S.C. section 360bbb-3(b)(1), unless the authorization is terminated or revoked.  Performed at [facility], [address] [place], [place] [address]      Radiology Studies: DG Chest 2 View  Result Date: 09/11/2020 CLINICAL DATA:  Chest pain. EXAM: CHEST - 2 VIEW COMPARISON:  July 1921. FINDINGS: The heart size and mediastinal contours are within normal limits. Tortuous aorta. No consolidation. No visible pleural effusions or pneumothorax. No acute osseous abnormality. Similar degenerative change of the left shoulder. IMPRESSION: No evidence of acute cardiopulmonary disease. Electronically Signed   By: Margaretha Sheffield MD   On: 09/11/2020 15:00   CT ABDOMEN PELVIS W CONTRAST  Result Date: 09/11/2020 CLINICAL DATA:  Abdominal pain, nausea, vomiting, constipation for 1 week. EXAM: CT ABDOMEN AND PELVIS WITH CONTRAST TECHNIQUE: Multidetector CT imaging of the abdomen and pelvis was performed using the standard protocol following bolus administration of intravenous contrast. CONTRAST:  15mL OMNIPAQUE IOHEXOL 300 MG/ML  SOLN COMPARISON:  12/06/2019 FINDINGS: Lower chest: The lung bases are clear. Hepatobiliary: Diffuse  fatty infiltration of the liver. Subcentimeter cysts. Gallbladder is surgically absent. No bile duct dilatation. Pancreas: Fatty replacement of the head and body of the pancreas. No acute inflammatory changes or loculated collections. Spleen: Normal in size without focal abnormality. Adrenals/Urinary Tract: No adrenal gland nodules. Multiple parenchymal cysts in the kidneys. Nephrograms are symmetrical. No hydronephrosis or hydroureter. Bladder is normal. Stomach/Bowel: Stomach, small bowel, and colon are decompressed. No abnormal distention. Partial right colectomy with ileocolonic anastomosis. Vascular/Lymphatic: Calcification of the aorta.  Tortuous aorta without aneurysmal dilatation. Right iliac artery aneurysm measuring 1.8 cm diameter. Left iliac artery aneurysm measuring 1.7 cm diameter. No significant lymphadenopathy. Reproductive: Uterus and bilateral adnexa are unremarkable. Other: No free air or free fluid in the abdomen. Abdominal wall musculature appears intact. Musculoskeletal: Previous left hip arthroplasty. Degenerative changes in the spine and right hip. No destructive bone lesions. IMPRESSION: 1. No acute process demonstrated in the abdomen or pelvis. 2. Diffuse fatty infiltration of the liver. 3. Fatty replacement of the head and body of the pancreas. 4. Bilateral iliac artery aneurysms. 5. Aortic atherosclerosis. Aortic Atherosclerosis (ICD10-I70.0). Electronically Signed   By: Lucienne Capers M.D.   On: 09/11/2020 19:38    Scheduled Meds: . acetaminophen  650 mg Oral Once  . amLODipine  5 mg Oral Daily  . aspirin  81 mg Oral Daily  . enoxaparin (LOVENOX) injection  30 mg Subcutaneous Q24H  . insulin aspart  0-9 Units Subcutaneous Q4H  . insulin glargine  6 Units Subcutaneous Daily  . magic mouthwash  10 mL Oral TID  . metoCLOPramide  5 mg Oral TID AC   Continuous Infusions:   LOS: 0 days   Bonnielee Haff, MD [facility] Pager On Amion  If 7PM-7AM, please contact  night-coverage 09/13/2020, 12:25 PM

## 2020-09-14 ENCOUNTER — Observation Stay (HOSPITAL_BASED_OUTPATIENT_CLINIC_OR_DEPARTMENT_OTHER): Payer: Medicare HMO

## 2020-09-14 DIAGNOSIS — R079 Chest pain, unspecified: Secondary | ICD-10-CM

## 2020-09-14 DIAGNOSIS — I10 Essential (primary) hypertension: Secondary | ICD-10-CM | POA: Diagnosis not present

## 2020-09-14 LAB — LIPID PANEL
Cholesterol: 186 mg/dL (ref 0–200)
HDL: 53 mg/dL (ref >40)
LDL Cholesterol: 117 mg/dL — ABNORMAL HIGH (ref 0–99)
Total CHOL/HDL Ratio: 3.5 RATIO
Triglycerides: 82 mg/dL (ref <150)
VLDL: 16 mg/dL (ref 0–40)

## 2020-09-14 LAB — BASIC METABOLIC PANEL
Anion gap: 8 (ref 5–15)
BUN: 9 mg/dL (ref 8–23)
CO2: 25 mmol/L (ref 22–32)
Calcium: 9.8 mg/dL (ref 8.9–10.3)
Chloride: 107 mmol/L (ref 98–111)
Creatinine, Ser: 1.1 mg/dL — ABNORMAL HIGH (ref 0.44–1.00)
GFR, Estimated: 49 mL/min — ABNORMAL LOW (ref >60)
Glucose, Bld: 137 mg/dL — ABNORMAL HIGH (ref 70–99)
Potassium: 4.7 mmol/L (ref 3.5–5.1)
Sodium: 140 mmol/L (ref 135–145)

## 2020-09-14 LAB — CBC
HCT: 35.8 % — ABNORMAL LOW (ref 36.0–46.0)
Hemoglobin: 11.7 g/dL — ABNORMAL LOW (ref 12.0–15.0)
MCH: 29.5 pg (ref 26.0–34.0)
MCHC: 32.7 g/dL (ref 30.0–36.0)
MCV: 90.2 fL (ref 80.0–100.0)
Platelets: 188 10*3/uL (ref 150–400)
RBC: 3.97 MIL/uL (ref 3.87–5.11)
RDW: 14.2 % (ref 11.5–15.5)
WBC: 5.7 10*3/uL (ref 4.0–10.5)
nRBC: 0 % (ref 0.0–0.2)

## 2020-09-14 LAB — ECHOCARDIOGRAM COMPLETE
Area-P 1/2: 2.84 cm2
Height: 66 in
S' Lateral: 2.9 cm
Weight: 2504.43 oz

## 2020-09-14 LAB — GLUCOSE, CAPILLARY
Glucose-Capillary: 147 mg/dL — ABNORMAL HIGH (ref 70–99)
Glucose-Capillary: 147 mg/dL — ABNORMAL HIGH (ref 70–99)
Glucose-Capillary: 159 mg/dL — ABNORMAL HIGH (ref 70–99)

## 2020-09-14 LAB — MAGNESIUM: Magnesium: 1.7 mg/dL (ref 1.7–2.4)

## 2020-09-14 MED ORDER — ENOXAPARIN SODIUM 40 MG/0.4ML ~~LOC~~ SOLN: 40.0000 mg | SUBCUTANEOUS | Status: DC

## 2020-09-14 MED ORDER — ATORVASTATIN CALCIUM 20 MG PO TABS: 20.0000 mg | ORAL_TABLET | Freq: Every day | ORAL | 1 refills | Status: DC

## 2020-09-14 MED ORDER — METOCLOPRAMIDE HCL 5 MG PO TABS: 5.0000 mg | ORAL_TABLET | Freq: Three times a day (TID) | ORAL | 0 refills | Status: DC

## 2020-09-14 NOTE — Progress Notes (Signed)
*  PRELIMINARY RESULTS* Echocardiogram 2D Echocardiogram has been performed.  Traci Clark 09/14/2020, 10:02 AM

## 2020-09-14 NOTE — Discharge Summary (Addendum)
[facility]  Physician Discharge Summary   Patient ID: Traci Clark MRN: [id number] DOB/AGE: 09-28-1930 87 y.o.  Admit date: 09/11/2020 Discharge date: 09/14/2020  PCP: Lavella Lemons, PA  DISCHARGE DIAGNOSES:  Atypical chest pain Abdominal pain with nausea and vomiting Concern for gastroparesis Acute kidney injury on chronic kidney disease stage IIIb Type is mellitus type II Bilateral iliac artery aneurysms Essential hypertension  RECOMMENDATIONS FOR OUTPATIENT FOLLOW UP: 1. Outpatient follow-up with PCP 2. Cardiology to arrange outpatient follow-up in their office 3. Please consider referral to vascular surgery for bilateral iliac artery aneurysms noted incidentally.  See below.  Home Health: None Equipment/Devices: None  CODE STATUS: Full code  DISCHARGE CONDITION: fair  Diet recommendation: As before  INITIAL HISTORY: 87 y.o.femalewith medical history significant forhypertension, T2DM, GERD, goutandconstipationwhopresents presents to the emergency department due to 1 week onset of abdominal pain which is associated with nausea and vomiting. Abdominal pain was diffuse but worse in lower quadrants, it was persistent and crampy in nature. It was rated at 9/10 (max) and 5-6/10 (minimal). No alleviating or aggravating factor known. She states that she hasbeento her PCP twice without improvement in the abdominal pain, so she was asked to go to the ED for further evaluation. She denies fever, chills, chest pain, shortness of breath. Last bowel movement was 4/24  Consultations:  Cardiology  Procedures:  Transthoracic echocardiogram MPRESSIONS  1. Left ventricular ejection fraction, by estimation, is 55 to 60%. The left ventricle has normal function. The left ventricle has no regional wall motion abnormalities. There is mild left ventricular hypertrophy. Left ventricular diastolic parameters are consistent with Grade I diastolic dysfunction  (impaired relaxation).  2. Right ventricular systolic function is normal. The right ventricular size is normal. Tricuspid regurgitation signal is inadequate for assessing PA pressure.  3. The mitral valve is grossly normal. No evidence of mitral valve regurgitation.  4. The aortic valve is tricuspid. There is mild calcification of the aortic valve. Aortic valve regurgitation is not visualized.  5. The inferior vena cava is normal in size with greater than 50% respiratory variability, suggesting right atrial pressure of 3 mmHg.     HOSPITAL COURSE:   Chest pain Patient presented with nausea vomiting abdominal pain thought to be due to gastroparesis.  Subsequently she started complaining of chest pain in the retrosternal area.  EKG showed some T wave inversions in the lateral leads.  Troponins were normal.  Patient was already on aspirin which was continued.  Patient was seen by cardiology.  Echocardiogram was done which showed normal systolic function.  No wall motion abnormalities noted.  Patient feels better this morning.  Cleared by cardiology for discharge.  They will arrange outpatient follow-up.  Symptoms could be GI in origin. LDL noted to be 117.  Started on statin.  Colchicine has interaction with statins.  This has been discontinued.  Intractable abdominal painR/Ogastroparesis  This was her reason for presentation.  CT abdomen did not reveal any acute findings.  Patient with history of diabetes and there was some concern that she may have gastroparesis.  She was started on Reglan with improvement in symptoms.    Continue PPI as well.  GERD Continue PPI  Acute kidney injury on CKD 3B Baseline Creatinine 1.1.  Presented with creatinine of 1.5.  She was given gentle IV hydration with improvement.    Hyperglycemia secondary to T2DM HbA1c 7.5.    Continue home medication regimen  Incidental finding of bilateral iliac artery aneurysm CT abdomen and  pelvis showed right  iliacarteryaneurysm measuring 1.8cm and left iliac artery aneurysm measuring 1.7cmdiameter Recommend outpatient follow up.   Essential hypertension (controlled) Reasonably well controlled.  Gout Continue home medication when patient resumes oral intake   Patient is stable.  Okay for discharge home today.   PERTINENT LABS:  The results of significant diagnostics from this hospitalization (including imaging, microbiology, ancillary and laboratory) are listed below for reference.    Microbiology: Recent Results (from the past 240 hour(s))  Urine culture     Status: None   Collection Time: 09/11/20  5:50 PM   Specimen: Urine, Clean Catch  Result Value Ref Range Status   Specimen Description   Final    URINE, CLEAN CATCH Performed at [facility], [address], [place], [place] [address]    Special Requests   Final    NONE Performed at [facility], [address] [place], [place] [address]    Culture   Final    NO GROWTH Performed at [facility], [place] [address] [place],  [address]    Report Status 09/13/2020 FINAL  Final  Resp Panel by RT-PCR (Flu A&B, Covid) Nasopharyngeal Swab     Status: None   Collection Time: 09/11/20 11:45 PM   Specimen: Nasopharyngeal Swab; Nasopharyngeal(NP) swabs in vial transport medium  Result Value Ref Range Status   SARS Coronavirus 2 by RT PCR NEGATIVE NEGATIVE Final    Comment: (NOTE) SARS-CoV-2 target nucleic acids are NOT DETECTED.  The SARS-CoV-2 RNA is generally detectable in upper respiratory specimens during the acute phase of infection. The lowest concentration of SARS-CoV-2 viral copies this assay can detect is 138 copies/mL. A negative result does not preclude SARS-Cov-2 infection and should not be used as the sole basis for treatment or other patient management decisions. A negative result may occur with  improper specimen collection/handling, submission of specimen other than nasopharyngeal swab,  presence of viral mutation(s) within the areas targeted by this assay, and inadequate number of viral copies(<138 copies/mL). A negative result must be combined with clinical observations, patient history, and epidemiological information. The expected result is Negative.  Fact Sheet for Patients:  EntrepreneurPulse.com.au  Fact Sheet for Healthcare Providers:  IncredibleEmployment.be  This test is no t yet approved or cleared by the [place] FDA and  has been authorized for detection and/or diagnosis of SARS-CoV-2 by FDA under an Emergency Use Authorization (EUA). This EUA will remain  in effect (meaning this test can be used) for the duration of the COVID-19 declaration under Section 564(b)(1) of the Act, 21 U.S.C.section 360bbb-3(b)(1), unless the authorization is terminated  or revoked sooner.       Influenza A by PCR NEGATIVE NEGATIVE Final   Influenza B by PCR NEGATIVE NEGATIVE Final    Comment: (NOTE) The Xpert Xpress SARS-CoV-2/FLU/RSV plus assay is intended as an aid in the diagnosis of influenza from Nasopharyngeal swab specimens and should not be used as a sole basis for treatment. Nasal washings and aspirates are unacceptable for Xpert Xpress SARS-CoV-2/FLU/RSV testing.  Fact Sheet for Patients: EntrepreneurPulse.com.au  Fact Sheet for Healthcare Providers: IncredibleEmployment.be  This test is not yet approved or cleared by the [place] FDA and has been authorized for detection and/or diagnosis of SARS-CoV-2 by FDA under an Emergency Use Authorization (EUA). This EUA will remain in effect (meaning this test can be used) for the duration of the COVID-19 declaration under Section 564(b)(1) of the Act, 21 U.S.C. section 360bbb-3(b)(1), unless the authorization is terminated  or revoked.  Performed at [facility], [address], [place], [place] [address]      Labs:  COVID-19  Labs   Lab Results  Component Value Date   [place] 09/11/2020   [place] NEGATIVE 07/19/2020      Basic Metabolic Panel: Recent Labs  Lab 09/11/20 1559 09/12/20 0651 09/13/20 0542 09/14/20 0455  NA 134* 138 139 140  K 4.1 4.4 4.4 4.7  CL 98 105 107 107  CO2 25 26 25 25   GLUCOSE 119* 124* 123* 137*  BUN 15 14 14 9   CREATININE 1.54* 1.36* 1.31* 1.10*  CALCIUM 9.9 9.5 9.2 9.8  MG  --  1.7  --  1.7  PHOS  --  2.9  --   --    Liver Function Tests: Recent Labs  Lab 09/11/20 1559 09/12/20 0651 09/13/20 0542  AST 29 23 20   ALT 17 14 13   ALKPHOS 126 104 88  BILITOT 1.4* 1.4* 1.4*  PROT 8.0 6.6 6.0*  ALBUMIN 4.0 3.3* 3.0*   Recent Labs  Lab 09/11/20 1559  LIPASE 31   CBC: Recent Labs  Lab 09/11/20 1559 09/12/20 0651 09/13/20 0542 09/14/20 0455  WBC 6.0 6.0 5.1 5.7  NEUTROABS 2.8  --   --   --   HGB 13.0 11.6* 10.6* 11.7*  HCT 39.3 35.9* 32.6* 35.8*  MCV 88.7 90.7 91.1 90.2  PLT 193 149* 163 188   CBG: Recent Labs  Lab 09/13/20 2043 09/13/20 2346 09/14/20 0407 09/14/20 0726 09/14/20 1113  GLUCAP 107* 110* 147* 147* 159*     IMAGING STUDIES DG Chest 2 View  Result Date: 09/11/2020 CLINICAL DATA:  Chest pain. EXAM: CHEST - 2 VIEW COMPARISON:  July 1921. FINDINGS: The heart size and mediastinal contours are within normal limits. Tortuous aorta. No consolidation. No visible pleural effusions or pneumothorax. No acute osseous abnormality. Similar degenerative change of the left shoulder. IMPRESSION: No evidence of acute cardiopulmonary disease. Electronically Signed   By: Margaretha Sheffield MD   On: 09/11/2020 15:00   CT ABDOMEN PELVIS W CONTRAST  Result Date: 09/11/2020 CLINICAL DATA:  Abdominal pain, nausea, vomiting, constipation for 1 week. EXAM: CT ABDOMEN AND PELVIS WITH CONTRAST TECHNIQUE: Multidetector CT imaging of the abdomen and pelvis was performed using the standard protocol following bolus administration of intravenous  contrast. CONTRAST:  193mL OMNIPAQUE IOHEXOL 300 MG/ML  SOLN COMPARISON:  12/06/2019 FINDINGS: Lower chest: The lung bases are clear. Hepatobiliary: Diffuse fatty infiltration of the liver. Subcentimeter cysts. Gallbladder is surgically absent. No bile duct dilatation. Pancreas: Fatty replacement of the head and body of the pancreas. No acute inflammatory changes or loculated collections. Spleen: Normal in size without focal abnormality. Adrenals/Urinary Tract: No adrenal gland nodules. Multiple parenchymal cysts in the kidneys. Nephrograms are symmetrical. No hydronephrosis or hydroureter. Bladder is normal. Stomach/Bowel: Stomach, small bowel, and colon are decompressed. No abnormal distention. Partial right colectomy with ileocolonic anastomosis. Vascular/Lymphatic: Calcification of the aorta. Tortuous aorta without aneurysmal dilatation. Right iliac artery aneurysm measuring 1.8 cm diameter. Left iliac artery aneurysm measuring 1.7 cm diameter. No significant lymphadenopathy. Reproductive: Uterus and bilateral adnexa are unremarkable. Other: No free air or free fluid in the abdomen. Abdominal wall musculature appears intact. Musculoskeletal: Previous left hip arthroplasty. Degenerative changes in the spine and right hip. No destructive bone lesions. IMPRESSION: 1. No acute process demonstrated in the abdomen or pelvis. 2. Diffuse fatty infiltration of the liver. 3. Fatty replacement of the head and body of the pancreas. 4.  Bilateral iliac artery aneurysms. 5. Aortic atherosclerosis. Aortic Atherosclerosis (ICD10-I70.0). Electronically Signed   By: Lucienne Capers M.D.   On: 09/11/2020 19:38   ECHOCARDIOGRAM COMPLETE  Result Date: 09/14/2020    ECHOCARDIOGRAM REPORT   Patient Name:   TAMBERLYN MIDGLEY Trejos Date of Exam: 09/14/2020 Medical Rec #:  [id number]          Height:       66.0 in Accession #:    [id number]         Weight:       156.5 lb Date of Birth:  [birth year]          BSA:          1.802 m Patient Age:     42 years           BP:           174/86 mmHg Patient Gender: F                  HR:           68 bpm. Exam Location:  [facility] Procedure: 2D Echo, Cardiac Doppler and Color Doppler Indications:    Chest pain  History:        Patient has no prior history of Echocardiogram examinations.                 CHF, Previous Myocardial Infarction, Signs/Symptoms:Chest Pain;                 Risk Factors:Hypertension and Diabetes.  Sonographer:    Dustin Flock RDCS Referring Phys: [id number] [place]  1. Left ventricular ejection fraction, by estimation, is 55 to 60%. The left ventricle has normal function. The left ventricle has no regional wall motion abnormalities. There is mild left ventricular hypertrophy. Left ventricular diastolic parameters are consistent with Grade I diastolic dysfunction (impaired relaxation).  2. Right ventricular systolic function is normal. The right ventricular size is normal. Tricuspid regurgitation signal is inadequate for assessing PA pressure.  3. The mitral valve is grossly normal. No evidence of mitral valve regurgitation.  4. The aortic valve is tricuspid. There is mild calcification of the aortic valve. Aortic valve regurgitation is not visualized.  5. The inferior vena cava is normal in size with greater than 50% respiratory variability, suggesting right atrial pressure of 3 mmHg. FINDINGS  Left Ventricle: Left ventricular ejection fraction, by estimation, is 55 to 60%. The left ventricle has normal function. The left ventricle has no regional wall motion abnormalities. The left ventricular internal cavity size was normal in size. There is  mild left ventricular hypertrophy. Left ventricular diastolic parameters are consistent with Grade I diastolic dysfunction (impaired relaxation). Right Ventricle: The right ventricular size is normal. No increase in right ventricular wall thickness. Right ventricular systolic function is normal. Tricuspid regurgitation  signal is inadequate for assessing PA pressure. Left Atrium: Left atrial size was normal in size. Right Atrium: Right atrial size was normal in size. Pericardium: There is no evidence of pericardial effusion. Presence of pericardial fat pad. Mitral Valve: The mitral valve is grossly normal. No evidence of mitral valve regurgitation. Tricuspid Valve: The tricuspid valve is grossly normal. Tricuspid valve regurgitation is trivial. Aortic Valve: The aortic valve is tricuspid. There is mild calcification of the aortic valve. Aortic valve regurgitation is not visualized. Pulmonic Valve: The pulmonic valve was grossly normal. Pulmonic valve regurgitation is trivial. Aorta: The aortic root is normal in size and structure. Venous: The  inferior vena cava is normal in size with greater than 50% respiratory variability, suggesting right atrial pressure of 3 mmHg. IAS/Shunts: No atrial level shunt detected by color flow Doppler.  LEFT VENTRICLE PLAX 2D LVIDd:         4.00 cm  Diastology LVIDs:         2.90 cm  LV e' medial:   4.35 cm/s LV PW:         1.30 cm  LV E/e' medial: 12.1 LV IVS:        1.20 cm LVOT diam:     2.60 cm LV SV:         81 LV SV Index:   45 LVOT Area:     5.31 cm  RIGHT VENTRICLE RV Basal diam:  3.40 cm RV S prime:     8.38 cm/s LEFT ATRIUM             Index       RIGHT ATRIUM           Index LA diam:        3.40 cm 1.89 cm/m  RA Area:     14.20 cm LA Vol (A2C):   52.3 ml 29.02 ml/m RA Volume:   41.20 ml  22.86 ml/m LA Vol (A4C):   46.3 ml 25.69 ml/m LA Biplane Vol: 51.7 ml 28.69 ml/m  AORTIC VALVE LVOT Vmax:   70.50 cm/s LVOT Vmean:  48.800 cm/s LVOT VTI:    0.152 m  AORTA Ao Root diam: 3.20 cm MITRAL VALVE MV Area (PHT): 2.84 cm    SHUNTS MV Decel Time: 267 msec    Systemic VTI:  0.15 m MV E velocity: 52.70 cm/s  Systemic Diam: 2.60 cm MV A velocity: 66.80 cm/s MV E/A ratio:  0.79 Rozann Lesches MD Electronically signed by Rozann Lesches MD Signature Date/Time: 09/14/2020/11:33:46 AM    Final      DISCHARGE EXAMINATION: Vitals:   09/13/20 2142 09/14/20 0526 09/14/20 0743 09/14/20 1513  BP: (!) 133/56 (!) 152/68 (!) 174/86 126/79  Pulse: (!) 59 (!) 57 68 62  Resp: 20 18 18 20   Temp: 98.2 F (36.8 C) 99.3 F (37.4 C) 98.2 F (36.8 C) 98.5 F (36.9 C)  TempSrc: Oral Oral Oral Oral  SpO2: 100% 94% 95% 99%  Weight:      Height:       General appearance: Awake alert.  In no distress Resp: Clear to auscultation bilaterally.  Normal effort Cardio: S1-S2 is normal regular.  No S3-S4.  No rubs murmurs or bruit GI: Abdomen is soft.  Nontender nondistended.  Bowel sounds are present normal.  No masses organomegaly    DISPOSITION: Home  Discharge Instructions    Call MD for:  difficulty breathing, headache or visual disturbances   Complete by: As directed    Call MD for:  extreme fatigue   Complete by: As directed    Call MD for:  persistant dizziness or light-headedness   Complete by: As directed    Call MD for:  persistant nausea and vomiting   Complete by: As directed    Call MD for:  severe uncontrolled pain   Complete by: As directed    Call MD for:  temperature >100.4   Complete by: As directed    Diet - low sodium heart healthy   Complete by: As directed    Diet Carb Modified   Complete by: As directed    Discharge instructions   Complete by:  As directed    Your cholesterol level was noted to be elevated so you are being prescribed a cholesterol medication.  Please be sure to follow-up with your primary care provider within 1 week.  Small aneurysms were noted in the blood vessels in your abdomen.  Please discuss this with your primary care provider and they need to consider referring you to a vascular doctor.   You were cared for by a hospitalist during your hospital stay. If you have any questions about your discharge medications or the care you received while you were in the hospital after you are discharged, you can call the unit and asked to speak with the  hospitalist on call if the hospitalist that took care of you is not available. Once you are discharged, your primary care physician will handle any further medical issues. Please note that NO REFILLS for any discharge medications will be authorized once you are discharged, as it is imperative that you return to your primary care physician (or establish a relationship with a primary care physician if you do not have one) for your aftercare needs so that they can reassess your need for medications and monitor your lab values. If you do not have a primary care physician, you can call [phone] for a physician referral.   Increase activity slowly   Complete by: As directed         Allergies as of 09/14/2020      Reactions   Amoxicillin Anaphylaxis, Rash   "Skin peeled off"   Macrobid [nitrofurantoin Monohyd Macro] Anaphylaxis, Rash, Other (See Comments)   "Skin peeled off"   Codeine Nausea And Vomiting, Other (See Comments)   shaking   Darvon [propoxyphene] Nausea And Vomiting, Other (See Comments)   shaking      Medication List    STOP taking these medications   colchicine 0.6 MG tablet   meclizine 25 MG tablet Commonly known as: ANTIVERT   ondansetron 8 MG disintegrating tablet Commonly known as: ZOFRAN-ODT     TAKE these medications   acetaminophen 325 MG tablet Commonly known as: TYLENOL Take 650 mg by mouth every 6 (six) hours as needed for pain.   allopurinol 300 MG tablet Commonly known as: ZYLOPRIM Take 300 mg by mouth daily.   ALPRAZolam 0.5 MG tablet Commonly known as: XANAX Take 1 tablet (0.5 mg total) by mouth 3 (three) times daily as needed. What changed: reasons to take this   amLODipine 5 MG tablet Commonly known as: NORVASC Take 5 mg by mouth daily.   aspirin 81 MG tablet Take 1 tablet (81 mg total) by mouth daily.   atorvastatin 20 MG tablet Commonly known as: LIPITOR Take 1 tablet (20 mg total) by mouth daily.   BASAGLAR KWIKPEN [place] Inject 12 Units  into the skin at bedtime.   BIOTIN PO Take 1 capsule by mouth daily.   fluticasone 27.5 MCG/SPRAY nasal spray Commonly known as: VERAMYST Place 2 sprays into the nose daily.   furosemide 20 MG tablet Commonly known as: LASIX Take 20 mg by mouth daily.   lubiprostone 8 MCG capsule Commonly known as: Amitiza Take 1 capsule (8 mcg total) by mouth 2 (two) times daily with a meal.   metoCLOPramide 5 MG tablet Commonly known as: REGLAN Take 1 tablet (5 mg total) by mouth 3 (three) times daily before meals.   mirtazapine 15 MG tablet Commonly known as: REMERON Take 15 mg by mouth at bedtime.   multivitamin with minerals tablet Take  1 tablet by mouth daily.   NovoLOG FlexPen ReliOn 100 UNIT/ML FlexPen Generic drug: insulin aspart Inject 4-10 Units into the skin 3 (three) times daily with meals. Per sliding scale   nystatin 100000 UNIT/ML suspension Commonly known as: MYCOSTATIN Take 5 mLs by mouth 4 (four) times daily.   pantoprazole 40 MG tablet Commonly known as: PROTONIX Take 40 mg by mouth daily.   polyethylene glycol 17 g packet Commonly known as: MIRALAX / GLYCOLAX Take 17 g by mouth daily.   potassium chloride 10 MEQ tablet Commonly known as: KLOR-CON Take 10 mEq by mouth daily.   traMADol 50 MG tablet Commonly known as: ULTRAM Take 50 mg by mouth every 6 (six) hours as needed for severe pain.   Travoprost (BAK Free) 0.004 % Soln ophthalmic solution Commonly known as: TRAVATAN Place 1 drop into both eyes at bedtime.         Follow-up Information    Charlie Pitter, PA-C Follow up on 10/05/2020.   Specialties: Cardiology, Radiology Why: Cardiology Hospital Follow-up on 10/05/2020 at 1:00 PM. Please contact the office if needing a different date or time.  Contact information: [place] [address] [phone]        Lavella Lemons, PA. Schedule an appointment as soon as possible for a visit in 1 week(s).   Specialty: Physician  Assistant Contact information: [place] [address] [phone]               TOTAL DISCHARGE TIME: 18 minutes  [place]  [facility] Pager on [email or web address]  09/15/2020, 11:36 AM

## 2020-09-14 NOTE — Progress Notes (Signed)
Physical Therapy Treatment Patient Details Name: Lila Lufkin MRN: [id number] DOB: [birth year] Today's Date: 09/14/2020    History of Present Illness Tonna Palazzi is a 87 y.o. female with c/o abdominal pain with nausea and vomitting. PMH: colon cancer, MI, diabetes, GERD, gout, constipation , L THA    PT Comments    Patient presents supine in bed, is pleasant, awake, alert and cooperative. Patient able to maneuver bed mobility Mod I using hands. Patient able to perform sit to stand with use of hands and RW for support and standing. Patient shows good stability standing with RW, and was able to ambulate 125 feet in hallway with Mod I using walker. Patient showing good safety awareness and reports minimal fatigue. Patient returned to room and set up in chair. Patient performed seated LE strengthening upright in chair with good return. Patient with good tolerance today and shows improving functional ability. Patient will benefit from continued physical therapy in hospital and recommended venue below to increase strength, balance, endurance for safe ADLs and gait.     Follow Up Recommendations  Home health PT     Equipment Recommendations  None recommended by PT    Recommendations for Other Services       Precautions / Restrictions      Mobility  Bed Mobility Overal bed mobility: Modified Independent             General bed mobility comments: use of be rail, hand for sit to stand, RW    Transfers Overall transfer level: Modified independent Equipment used: Rolling walker (2 wheeled) Transfers: Sit to/from Stand Sit to Stand: Modified independent (Device/Increase time)         General transfer comment: Uses hands to push up from bed, RW for support standing  Ambulation/Gait Ambulation/Gait assistance: Modified independent (Device/Increase time) Gait Distance (Feet): 125 Feet Assistive device: Rolling walker (2 wheeled) Gait Pattern/deviations: Step-through  pattern;Decreased stride length;Trunk flexed     General Gait Details: Showed improved posturing today, good safety awarenss with turns   Marine scientist Rankin (Stroke Patients Only)       Balance Overall balance assessment: Needs assistance;Modified Independent Sitting-balance support: Feet supported Sitting balance-Leahy Scale: Good Sitting balance - Comments: seated EOB   Standing balance support: During functional activity;Bilateral upper extremity supported Standing balance-Leahy Scale: Fair Standing balance comment: reliant on UE support                            Cognition Arousal/Alertness: Awake/alert Behavior During Therapy: WFL for tasks assessed/performed Overall Cognitive Status: Within Functional Limits for tasks assessed                                        Exercises General Exercises - Lower Extremity Ankle Circles/Pumps: 15 reps;Strengthening;Seated Long Arc Quad: 15 reps;Strengthening;Seated Hip Flexion/Marching: Strengthening;Seated;15 reps    General Comments        Pertinent Vitals/Pain Pain Assessment: No/denies pain    Home Living                      Prior Function            PT Goals (current goals can now be found in the care plan section) Acute Rehab PT Goals  Patient Stated Goal: return home with family to assist PT Goal Formulation: With patient/family Time For Goal Achievement: 09/26/20 Potential to Achieve Goals: Good Progress towards PT goals: Progressing toward goals    Frequency    Min 3X/week      PT Plan Current plan remains appropriate    Co-evaluation              AM-PAC PT "6 Clicks" Mobility   Outcome Measure  Help needed turning from your back to your side while in a flat bed without using bedrails?: None Help needed moving from lying on your back to sitting on the side of a flat bed without using bedrails?:  None Help needed moving to and from a bed to a chair (including a wheelchair)?: None Help needed standing up from a chair using your arms (e.g., wheelchair or bedside chair)?: None Help needed to walk in hospital room?: A Little Help needed climbing 3-5 steps with a railing? : A Little 6 Click Score: 22    End of Session Equipment Utilized During Treatment: Gait belt Activity Tolerance: Patient tolerated treatment well Patient left: with call bell/phone within reach;in chair Nurse Communication: Mobility status PT Visit Diagnosis: Other abnormalities of gait and mobility (R26.89)     Time: 4680-3212 PT Time Calculation (min) (ACUTE ONLY): 23 min  Charges:  $Gait Training: 8-22 mins $Therapeutic Exercise: 8-22 mins                     1:53 PM, 09/14/20 Josue Hector PT DPT  Physical Therapist with [facility]  [phone]

## 2020-09-14 NOTE — Progress Notes (Signed)
Progress Note  Patient Name: Traci Clark Date of Encounter: 09/14/2020  [facility] Cardiologist: No primary care provider on file.   Subjective   Patient reports that her chest heaviness has eased up today. Overall feels weak. Had dizziness when trying to go to the bathroom. BP elevated today at 174/86 mmHg.  Telemetry: normal sinus rhythm, HR 60s  Inpatient Medications    Scheduled Meds: . amLODipine  5 mg Oral Daily  . aspirin  81 mg Oral Daily  . enoxaparin (LOVENOX) injection  30 mg Subcutaneous Q24H  . insulin aspart  0-9 Units Subcutaneous Q4H  . insulin glargine  6 Units Subcutaneous Daily  . magic mouthwash  10 mL Oral TID  . metoCLOPramide  5 mg Oral TID AC   Continuous Infusions:  PRN Meds: ALPRAZolam, nitroGLYCERIN, ondansetron (ZOFRAN) IV, oxyCODONE, polyethylene glycol   Vital Signs    Vitals:   09/13/20 1424 09/13/20 2142 09/14/20 0526 09/14/20 0743  BP: (!) 152/71 (!) 133/56 (!) 152/68 (!) 174/86  Pulse: 63 (!) 59 (!) 57 68  Resp: 16 20 18 18   Temp: 97.9 F (36.6 C) 98.2 F (36.8 C) 99.3 F (37.4 C) 98.2 F (36.8 C)  TempSrc: Oral Oral Oral Oral  SpO2: 98% 100% 94% 95%  Weight:      Height:       No intake or output data in the 24 hours ending 09/14/20 0849 Last 3 Weights 09/12/2020 09/11/2020 07/19/2020  Weight (lbs) 156 lb 8.4 oz 154 lb 159 lb  Weight (kg) 71 kg 69.854 kg 72.122 kg      Telemetry    Sinus rhythm with HR in the 60s - Personally Reviewed  ECG    Sinus rhythm , incomplete LBBB morphology / IVCD - Personally Reviewed  Physical Exam   GEN: No acute distress.   Neck: No JVD Cardiac: RRR, no murmurs, rubs, or gallops.  Respiratory: Clear to auscultation bilaterally. GI: Soft, nontender, non-distended  MS: No edema; No deformity. Neuro:  Nonfocal  Psych: Normal affect   Labs    High Sensitivity Troponin:   Recent Labs  Lab 09/11/20 1559 09/13/20 0542 09/13/20 0947  TROPONINIHS 8 4 5        Chemistry Recent Labs  Lab 09/11/20 1559 09/12/20 0651 09/13/20 0542 09/14/20 0455  NA 134* 138 139 140  K 4.1 4.4 4.4 4.7  CL 98 105 107 107  CO2 25 26 25 25   GLUCOSE 119* 124* 123* 137*  BUN 15 14 14 9   CREATININE 1.54* 1.36* 1.31* 1.10*  CALCIUM 9.9 9.5 9.2 9.8  PROT 8.0 6.6 6.0*  --   ALBUMIN 4.0 3.3* 3.0*  --   AST 29 23 20   --   ALT 17 14 13   --   ALKPHOS 126 104 88  --   BILITOT 1.4* 1.4* 1.4*  --   GFRNONAA 32* 38* 39* 49*  ANIONGAP 11 7 7 8      Hematology Recent Labs  Lab 09/12/20 0651 09/13/20 0542 09/14/20 0455  WBC 6.0 5.1 5.7  RBC 3.96 3.58* 3.97  HGB 11.6* 10.6* 11.7*  HCT 35.9* 32.6* 35.8*  MCV 90.7 91.1 90.2  MCH 29.3 29.6 29.5  MCHC 32.3 32.5 32.7  RDW 14.3 14.3 14.2  PLT 149* 163 188    BNPNo results for input(s): BNP, PROBNP in the last 168 hours.   DDimer No results for input(s): DDIMER in the last 168 hours.   Radiology    No results found.  Cardiac Studies   Echocardiogram: 09/14/2020 1. Left ventricular ejection fraction, by estimation, is 55 to 60%. The  left ventricle has normal function. The left ventricle has no regional  wall motion abnormalities. There is mild left ventricular hypertrophy.  Left ventricular diastolic parameters  are consistent with Grade I diastolic dysfunction (impaired relaxation).  2. Right ventricular systolic function is normal. The right ventricular  size is normal. Tricuspid regurgitation signal is inadequate for assessing  PA pressure.  3. The mitral valve is grossly normal. No evidence of mitral valve  regurgitation.  4. The aortic valve is tricuspid. There is mild calcification of the  aortic valve. Aortic valve regurgitation is not visualized.  5. The inferior vena cava is normal in size with greater than 50%  respiratory variability, suggesting right atrial pressure of 3 mmHg.   Patient Profile     87 y.o. female with a hx of HTN, T2DM, GERD, gout and constipation who is being seen  today for the evaluation of chest pain at the request of Bonnielee Haff, MD.   Assessment & Plan    1.  Intermittent chest pain The patient describes chest pain with atypical features (pain at rest and lasting for hours). She does however have risk factors for CAD (such as HTN, DM, age). The hs-troponins are 4 and 5. ECG showing NSR, incomplete LBBB morphology / IVCD.  - Continue to monitor on telemetry - Daily ECGs - Continue ASA 81 mg daily - Increase amlodipine to 10 mg daily - Check a lipid panel - Maintain serum K > 4.0 and Mg > 2.0  The echocardiogram from today shows an EF of 55-60%. No wall motion abnormalities. There is mild LVH and grade I diastolic dysfunction. No significant valvular disease present.  The patient can certainly follow-up in our outpatient clinic after being discharged from the hospital. No further cardiac work up recommended as an inpatient.    For questions or updates, please contact [place] Please consult [email or web address] for contact info under        Signed, Meade Maw, MD  09/14/2020, 8:49 AM

## 2020-10-02 ENCOUNTER — Encounter: Payer: Self-pay | Admitting: Vascular Surgery

## 2020-10-02 ENCOUNTER — Other Ambulatory Visit: Payer: Self-pay

## 2020-10-02 ENCOUNTER — Ambulatory Visit: Payer: Medicare HMO | Admitting: Vascular Surgery

## 2020-10-02 VITALS — BP 117/73 | Temp 97.6°F | Resp 14 | Ht 66.0 in | Wt 156.0 lb

## 2020-10-02 DIAGNOSIS — I723 Aneurysm of iliac artery: Secondary | ICD-10-CM

## 2020-10-02 NOTE — Progress Notes (Signed)
[facility] [place]  Patient name: Traci Clark MRN: [id number] DOB: June 10, 1930 Sex: female  REASON FOR CONSULT: Discuss recent CT scan suggesting bilateral iliac artery aneurysms  HPI: Traci Clark is a 87 y.o. female, who is here today for discussion of recent CT scan.  She is with her daughter and grandson as well.  She had presented to the [facility] emergency department with abdominal pain.  She had a CT scan and had diagnosis of gastroparesis.  She had an incidental finding of enlargement of both iliac arteries and is here today for discussion of this.  She has no other history of aneurysm.  Past Medical History:  Diagnosis Date  . Anxiety   . Arthritis   . Chronic constipation   . Colon cancer ([place])    colon  . Complication of anesthesia   . Diabetes mellitus without complication ([place])   . DVT (deep vein thrombosis) in pregnancy   . Dysphagia   . GERD (gastroesophageal reflux disease)   . Glaucoma   . Gout   . H/O hiatal hernia   . History of kidney stones   . MI (myocardial infarction) ([place]) 1993  . PONV (postoperative nausea and vomiting)   . Restless leg syndrome   . Seizure ([place])    withdrawal from xanax  . Vertigo     No family history on file.  SOCIAL HISTORY: Social History   Socioeconomic History  . Marital status: Married    Spouse name: Not on file  . Number of children: Not on file  . Years of education: Not on file  . Highest education level: Not on file  Occupational History  . Not on file  Tobacco Use  . Smoking status: Never Smoker  . Smokeless tobacco: Never Used  Vaping Use  . Vaping Use: Never used  Substance and Sexual Activity  . Alcohol use: No    Alcohol/week: 0.0 standard drinks  . Drug use: No  . Sexual activity: Not Currently    Birth control/protection: Surgical  Other Topics Concern  . Not on file  Social History Narrative  . Not on file   Social  Determinants of Health   Financial Resource Strain: Not on file  Food Insecurity: Not on file  Transportation Needs: Not on file  Physical Activity: Not on file  Stress: Not on file  Social Connections: Not on file  Intimate Partner Violence: Not on file    Allergies  Allergen Reactions  . Amoxicillin Anaphylaxis and Rash    "Skin peeled off"  . Macrobid [Nitrofurantoin Monohyd Macro] Anaphylaxis, Rash and Other (See Comments)    "Skin peeled off"  . Codeine Nausea And Vomiting and Other (See Comments)    shaking  . Darvon [Propoxyphene] Nausea And Vomiting and Other (See Comments)    shaking    Current Outpatient Medications  Medication Sig Dispense Refill  . acetaminophen (TYLENOL) 325 MG tablet Take 650 mg by mouth every 6 (six) hours as needed for pain.    Marland Kitchen allopurinol (ZYLOPRIM) 300 MG tablet Take 300 mg by mouth daily.    Marland Kitchen ALPRAZolam (XANAX) 0.5 MG tablet Take 1 tablet (0.5 mg total) by mouth 3 (three) times daily as needed. (Patient taking differently: Take 0.5 mg by mouth 3 (three) times daily as needed for anxiety.)    . amLODipine (NORVASC) 5 MG tablet Take 5 mg by mouth daily.    Marland Kitchen aspirin 81 MG tablet Take 1 tablet (  81 mg total) by mouth daily. 30 tablet   . atorvastatin (LIPITOR) 20 MG tablet Take 1 tablet (20 mg total) by mouth daily. 30 tablet 1  . BIOTIN PO Take 1 capsule by mouth daily.    . fluticasone (VERAMYST) 27.5 MCG/SPRAY nasal spray Place 2 sprays into the nose daily.    . furosemide (LASIX) 20 MG tablet Take 20 mg by mouth daily.    . insulin aspart (NOVOLOG FLEXPEN RELION) 100 UNIT/ML FlexPen Inject 4-10 Units into the skin 3 (three) times daily with meals. Per sliding scale    . Insulin Glargine (BASAGLAR KWIKPEN [place]) Inject 12 Units into the skin at bedtime.    . metoCLOPramide (REGLAN) 5 MG tablet Take 1 tablet (5 mg total) by mouth 3 (three) times daily before meals. 90 tablet 0  . mirtazapine (REMERON) 15 MG tablet Take 15 mg by mouth at bedtime.     . Multiple Vitamins-Minerals (MULTIVITAMIN WITH MINERALS) tablet Take 1 tablet by mouth daily.    Marland Kitchen nystatin (MYCOSTATIN) 100000 UNIT/ML suspension Take 5 mLs by mouth 4 (four) times daily.    . pantoprazole (PROTONIX) 40 MG tablet Take 40 mg by mouth daily.    . polyethylene glycol (MIRALAX / GLYCOLAX) packet Take 17 g by mouth daily.    . potassium chloride (KLOR-CON) 10 MEQ tablet Take 10 mEq by mouth daily.    . traMADol (ULTRAM) 50 MG tablet Take 50 mg by mouth every 6 (six) hours as needed for severe pain.    . Travoprost, BAK Free, (TRAVATAN) 0.004 % SOLN ophthalmic solution Place 1 drop into both eyes at bedtime.    Marland Kitchen lubiprostone (AMITIZA) 8 MCG capsule Take 1 capsule (8 mcg total) by mouth 2 (two) times daily with a meal. (Patient not taking: No sig reported) 180 capsule 3   No current facility-administered medications for this visit.    REVIEW OF SYSTEMS:  [X]  denotes positive finding, [ ]  denotes negative finding Cardiac  Comments:  Chest pain or chest pressure:    Shortness of breath upon exertion:    Short of breath when lying flat:    Irregular heart rhythm:        Vascular    Pain in calf, thigh, or hip brought on by ambulation:    Pain in feet at night that wakes you up from your sleep:     Blood clot in your veins:    Leg swelling:         Pulmonary    Oxygen at home:    Productive cough:     Wheezing:         Neurologic    Sudden weakness in arms or legs:     Sudden numbness in arms or legs:     Sudden onset of difficulty speaking or slurred speech:    Temporary loss of vision in one eye:     Problems with dizziness:         Gastrointestinal    Blood in stool:     Vomited blood:         Genitourinary    Burning when urinating:     Blood in urine:        Psychiatric    Major depression:         Hematologic    Bleeding problems:    Problems with blood clotting too easily:        Skin    Rashes or ulcers:  Constitutional    Fever or  chills:      PHYSICAL EXAM: Vitals:   10/02/20 1556  BP: 117/73  Resp: 14  Temp: 97.6 F (36.4 C)  TempSrc: Other (Comment)  SpO2: 95%  Weight: 156 lb (70.8 kg)  Height: 5\' 6"  (1.676 m)    GENERAL: The patient is a well-nourished female, in no acute distress. The vital signs are documented above. CARDIOVASCULAR: 2-3+ radial popliteal and dorsalis pedis pulses bilaterally.  No evidence of peripheral aneurysm. PULMONARY: There is good air exchange  MUSCULOSKELETAL: There are no major deformities or cyanosis. NEUROLOGIC: No focal weakness or paresthesias are detected. SKIN: There are no ulcers or rashes noted. PSYCHIATRIC: The patient has a normal affect.  DATA:  CT scan images were reviewed with the patient and her family.  She does have 1.8 cm right and 1.7 cm enlargement in her iliac arteries.  I reviewed prior CT scan from 2019 and the aneurysm size is similar  MEDICAL ISSUES: I discussed this with the patient and her family.  This is a stable size.  She really has ectasia of her arteries with some diffuse arteriomegaly.  I feel that she has no risk for rupture currently and minimal if any risk for progression in size.  I would not recommend any ongoing follow-up or imaging in the future.  She was reassured with this discussion will see [place] again as needed   Rosetta Posner, MD [facility] [phone] Pager [phone]  Note: Portions of this report may have been transcribed using voice recognition software.  Every effort has been made to ensure accuracy; however, inadvertent computerized transcription errors may still be present.

## 2020-10-04 ENCOUNTER — Encounter: Payer: Self-pay | Admitting: Physician Assistant

## 2020-10-04 NOTE — Progress Notes (Addendum)
Cardiology Office Note    Date:  10/05/2020   ID:  Traci Clark, DOB 1931/02/10, MRN [id number]  PCP:  Lavella Lemons, PA  Cardiologist:  None (seen by locums) Electrophysiologist:  None   Chief Complaint: f/u chest pain  History of Present Illness:   Traci Clark is a 87 y.o. female with history of HTN, T2DM, GERD, gout, constipation, colon CA s/p prior surgery, anxiety, ?prior DVT, hiatal hernia, RLS, bilateral iliac artery aneurysm, CKD stage III who presents for post-hospital follow-up. She was recently admitted 08/2020 with abdominal pain, nausea, vomiting and weight loss. She was noticing episodes of night sweats and chest heaviness at rest. hs-troponins were negative x3. She had AKI with Cr of 1.5 with baseline Cr reported 1.1. ECG with sinus rhythm, rate 53 bpm and incomplete LBBB. CXR was unremarkable. 2D echo was reassuring (EF 55-60%, grade 1 DD) and chest pain felt atypical, possibly GI in nature. She was started on statin and no further inpatient cardiac workup recommended. Her abdominal pain was felt possibly r/t gastroparesis. She had incidental finding of bilateral iliac artery aneurysm and has seen [facility] in follow-up for this. No further workup/surveillance felt needed.  She is seen back for follow-up accompanied by grandson. She generally hasn't felt well since discharge. From cardiac standpoint she is clinically stable. Chest pain has subsided and has not been an issue. Able to walk with walker without angina. Breathing stable, no edema. BP is running on the low-normal side, rechecked by me at 108/60. She complains of generalized malaise and leg fatigue since discharge. She also has a history of falls. She states she has since seen her PCP recently but did not think labs rechecked at that time. She has noticed episodic dark stools. She had EGD/colonoscopy in 07/2020 with esophagitis and colon polyps at that time. The last episode of black stools was yesterday. Takes an  MVI with iron in it. Appetite has been better and was able to gain a couple lbs back.   Labwork independently reviewed: 08/2020 Mg 1.7, LDL 117, K 4.7, Cr 1.10, Hgb 11.7, plt 188, troponin negx3, A1C 7.5 No TSH  Past Medical History:  Diagnosis Date  . Anxiety   . Arthritis   . Chronic constipation   . CKD (chronic kidney disease), stage III ([place])   . Colon cancer ([place])    colon  . Complication of anesthesia   . Diabetes mellitus without complication ([place])   . DVT (deep vein thrombosis) in pregnancy   . Dysphagia   . GERD (gastroesophageal reflux disease)   . Glaucoma   . Gout   . H/O hiatal hernia   . History of kidney stones   . Iliac artery aneurysm, bilateral ([place])   . MI (myocardial infarction) ([place]) 1993  . PONV (postoperative nausea and vomiting)   . Restless leg syndrome   . Seizure ([place])    withdrawal from xanax  . Vertigo     Past Surgical History:  Procedure Laterality Date  . BIOPSY  07/21/2020   Procedure: BIOPSY;  Surgeon: Harvel Quale, MD;  [facility];  Service: Gastroenterology;;  . CHOLECYSTECTOMY    . COLECTOMY  2003  . COLONOSCOPY N/A 07/29/2012   Procedure: COLONOSCOPY;  Surgeon: Rogene Houston, MD;  [facility];  Service: Endoscopy;  Laterality: N/A;  100  . COLONOSCOPY WITH PROPOFOL N/A 12/13/2016   Procedure: COLONOSCOPY WITH PROPOFOL;  Surgeon: Rogene Houston, MD;  [facility];  Service: Endoscopy;  Laterality: N/A;  8:30  . COLONOSCOPY WITH PROPOFOL N/A 07/21/2020   Procedure: COLONOSCOPY WITH PROPOFOL;  Surgeon: Harvel Quale, MD;  [facility];  Service: Gastroenterology;  Laterality: N/A;  AM  . ESOPHAGEAL DILATION  11/03/2015   Procedure: ESOPHAGEAL DILATION;  Surgeon: Rogene Houston, MD;  [facility];  Service: Endoscopy;;  . ESOPHAGEAL DILATION N/A 09/26/2017   Procedure: ESOPHAGEAL DILATION;  Surgeon: Rogene Houston, MD;  [facility];  Service:  Endoscopy;  Laterality: N/A;  . ESOPHAGOGASTRODUODENOSCOPY N/A 03/25/2014   Procedure: ESOPHAGOGASTRODUODENOSCOPY (EGD);  Surgeon: Rogene Houston, MD;  [facility];  Service: Endoscopy;  Laterality: N/A;  1055  . ESOPHAGOGASTRODUODENOSCOPY N/A 11/03/2015   Procedure: ESOPHAGOGASTRODUODENOSCOPY (EGD);  Surgeon: Rogene Houston, MD;  [facility];  Service: Endoscopy;  Laterality: N/A;  1:55 - moved to 11:15 - Ann to notify - moved to 12:45 - pt knows to arrive at 11:45  . ESOPHAGOGASTRODUODENOSCOPY N/A 09/26/2017   Procedure: ESOPHAGOGASTRODUODENOSCOPY (EGD);  Surgeon: Rogene Houston, MD;  [facility];  Service: Endoscopy;  Laterality: N/A;  . ESOPHAGOGASTRODUODENOSCOPY (EGD) WITH PROPOFOL N/A 07/21/2020   Procedure: ESOPHAGOGASTRODUODENOSCOPY (EGD) WITH PROPOFOL;  Surgeon: Harvel Quale, MD;  [facility];  Service: Gastroenterology;  Laterality: N/A;  . MALONEY DILATION N/A 03/25/2014   Procedure: Venia Minks DILATION;  Surgeon: Rogene Houston, MD;  [facility];  Service: Endoscopy;  Laterality: N/A;  . POLYPECTOMY  12/13/2016   Procedure: POLYPECTOMY;  Surgeon: Rogene Houston, MD;  [facility];  Service: Endoscopy;;  polyp  . POLYPECTOMY  07/21/2020   Procedure: POLYPECTOMY;  Surgeon: Montez Morita, Quillian Quince, MD;  [facility];  Service: Gastroenterology;;  . TOTAL HIP ARTHROPLASTY Left   . TUBAL LIGATION    . VEIN SURGERY Bilateral    stripped vein due to DVT    Current Medications: Current Meds  Medication Sig  . acetaminophen (TYLENOL) 325 MG tablet Take 650 mg by mouth every 6 (six) hours as needed for pain.  Marland Kitchen allopurinol (ZYLOPRIM) 300 MG tablet Take 300 mg by mouth daily.  Marland Kitchen ALPRAZolam (XANAX) 0.5 MG tablet Take 1 tablet (0.5 mg total) by mouth 3 (three) times daily as needed. (Patient taking differently: Take 0.5 mg by mouth 3 (three) times daily as needed for anxiety.)  . amLODipine (NORVASC)  5 MG tablet Take 5 mg by mouth daily.  Marland Kitchen aspirin 81 MG tablet Take 1 tablet (81 mg total) by mouth daily.  Marland Kitchen atorvastatin (LIPITOR) 20 MG tablet Take 1 tablet (20 mg total) by mouth daily.  Marland Kitchen BIOTIN PO Take 1 capsule by mouth daily.  Marland Kitchen dexlansoprazole (DEXILANT) 60 MG capsule Take 60 mg by mouth daily.  . fluticasone (FLONASE) 50 MCG/ACT nasal spray Place 2 sprays into both nostrils daily.  . fluticasone (VERAMYST) 27.5 MCG/SPRAY nasal spray Place 2 sprays into the nose daily.  . furosemide (LASIX) 20 MG tablet Take 20 mg by mouth daily.  . insulin aspart (NOVOLOG FLEXPEN RELION) 100 UNIT/ML FlexPen Inject 4-10 Units into the skin 3 (three) times daily with meals. Per sliding scale  . Insulin Glargine (BASAGLAR KWIKPEN [place]) Inject 12 Units into the skin at bedtime.  Marland Kitchen latanoprost (XALATAN) 0.005 % ophthalmic solution Place 1 drop into both eyes at bedtime.  . metoCLOPramide (REGLAN) 5 MG tablet Take 1 tablet (5 mg total) by mouth 3 (three) times daily before  meals.  . Multiple Vitamins-Minerals (MULTIVITAMIN WITH MINERALS) tablet Take 1 tablet by mouth daily.  Marland Kitchen nystatin (MYCOSTATIN) 100000 UNIT/ML suspension Take 5 mLs by mouth 4 (four) times daily.  . polyethylene glycol (MIRALAX / GLYCOLAX) packet Take 17 g by mouth daily.  . potassium chloride (KLOR-CON) 10 MEQ tablet Take 10 mEq by mouth daily.  . traMADol (ULTRAM) 50 MG tablet Take 50 mg by mouth every 6 (six) hours as needed for severe pain.  . Travoprost, BAK Free, (TRAVATAN) 0.004 % SOLN ophthalmic solution Place 1 drop into both eyes at bedtime.      Allergies:   Amoxicillin, Macrobid [nitrofurantoin monohyd macro], Codeine, and Darvon [propoxyphene]   Social History   Socioeconomic History  . Marital status: Married    Spouse name: Not on file  . Number of children: Not on file  . Years of education: Not on file  . Highest education level: Not on file  Occupational History  . Not on file  Tobacco Use  . Smoking status:  Never Smoker  . Smokeless tobacco: Never Used  Vaping Use  . Vaping Use: Never used  Substance and Sexual Activity  . Alcohol use: No    Alcohol/week: 0.0 standard drinks  . Drug use: No  . Sexual activity: Not Currently    Birth control/protection: Surgical  Other Topics Concern  . Not on file  Social History Narrative  . Not on file   Social Determinants of Health   Financial Resource Strain: Not on file  Food Insecurity: Not on file  Transportation Needs: Not on file  Physical Activity: Not on file  Stress: Not on file  Social Connections: Not on file     Family History:  The patient's family history includes Heart disease in her father.  ROS:   Please see the history of present illness.  All other systems are reviewed and otherwise negative.    EKGs/Labs/Other Studies Reviewed:    Studies reviewed are outlined and summarized above. Reports included below if pertinent.  2D echo 08/2020 1. Left ventricular ejection fraction, by estimation, is 55 to 60%. The  left ventricle has normal function. The left ventricle has no regional  wall motion abnormalities. There is mild left ventricular hypertrophy.  Left ventricular diastolic parameters  are consistent with Grade I diastolic dysfunction (impaired relaxation).  2. Right ventricular systolic function is normal. The right ventricular  size is normal. Tricuspid regurgitation signal is inadequate for assessing  PA pressure.  3. The mitral valve is grossly normal. No evidence of mitral valve  regurgitation.  4. The aortic valve is tricuspid. There is mild calcification of the  aortic valve. Aortic valve regurgitation is not visualized.  5. The inferior vena cava is normal in size with greater than 50%  respiratory variability, suggesting right atrial pressure of 3 mmHg.     EKG:  EKG is not ordered today  Recent Labs: 09/13/2020: ALT 13 09/14/2020: BUN 9; Creatinine, Ser 1.10; Hemoglobin 11.7; Magnesium 1.7;  Platelets 188; Potassium 4.7; Sodium 140  Recent Lipid Panel    Component Value Date/Time   CHOL 186 09/14/2020 0455   TRIG 82 09/14/2020 0455   HDL 53 09/14/2020 0455   CHOLHDL 3.5 09/14/2020 0455   VLDL 16 09/14/2020 0455   LDLCALC 117 (H) 09/14/2020 0455    PHYSICAL EXAM:    VS:  BP (!) 104/58   Pulse 76   Ht 5\' 6"  (1.676 m)   Wt 160 lb (72.6 kg)  SpO2 98%   BMI 25.82 kg/m   BMI: Body mass index is 25.82 kg/m.  GEN: Well nourished, well developed female in no acute distress HEENT: normocephalic, atraumatic Neck: no JVD, carotid bruits, or masses Cardiac: RRR; no murmurs, rubs, or gallops, no edema  Respiratory:  clear to auscultation bilaterally, normal work of breathing GI: soft, nontender, nondistended, + BS MS: no deformity or atrophy Skin: warm and dry, no rash Neuro:  Alert and Oriented x 3, Strength and sensation are intact, follows commands Psych: euthymic mood, full affect  Wt Readings from Last 3 Encounters:  10/05/20 160 lb (72.6 kg)  10/02/20 156 lb (70.8 kg)  09/12/20 156 lb 8.4 oz (71 kg)     ASSESSMENT & PLAN:   1. Recent chest pain - cardiac workup reassuring in the hospital with negative troponins and normal LV function by echo. In the absence of recurrent anginal-type symptoms, would recommend conservative monitoring for now. Will reserve consideration of stress testing if she has recurrence.  2. Fatigue - she reports generalized fatigue as well as a sense of leg fatigue since she was discharged from the hospital. SBP is low-normal so will stop amlodipine. Will also hold statin in case this is contributing to the feeling of leg weakness. She has no lower extremity erythema or edema. She also reports ongoing episodic black stools therefore I've asked her to stop aspirin for now. With her history of falling the risk just seems to outweigh the benefit. Will check CBC while she is here today. I advised f/u with primary care for her issues as they can't  otherwise be explained by any acute cardiac issue we are seeing.  3. Essential HTN - stop amlodipine as above. Patient instructed to monitor BP at home and call if tending to run >135 at home off amlodipine. If this is the case, can consider resuming amlodipine at lower dose.  4. Mild hyperlipidemia - patient reports sense of leg fatigue and general malaise since discharge. Was started on statin at that time. I have asked her to hold this for now to evaluate for any clinical improvement and follow up further with primary care.  5. Mild anemia - recheck CBC today.  Disposition: F/u with [facility] cardiologist to establish care in 6 months. Also encouraged close f/u with primary care.  Medication Adjustments/Labs and Tests Ordered: Current medicines are reviewed at length with the patient today.  Concerns regarding medicines are outlined above. Medication changes, Labs and Tests ordered today are summarized above and listed in the Patient Instructions accessible in Encounters.    Signed, Charlie Pitter, PA-C  10/05/2020 1:08 PM    [facility] Location in [place]. [place], [place] [address] Ph: [phone]; Fax [phone]

## 2020-10-05 ENCOUNTER — Ambulatory Visit: Payer: Medicare HMO | Admitting: Physician Assistant

## 2020-10-05 ENCOUNTER — Encounter: Payer: Self-pay | Admitting: Physician Assistant

## 2020-10-05 ENCOUNTER — Other Ambulatory Visit (HOSPITAL_COMMUNITY)
Admission: RE | Admit: 2020-10-05 | Discharge: 2020-10-05 | Disposition: A | Discharge status: Home / Self Care | Payer: Medicare HMO | Source: Ambulatory Visit | Attending: Physician Assistant | Admitting: Physician Assistant

## 2020-10-05 ENCOUNTER — Other Ambulatory Visit: Payer: Self-pay

## 2020-10-05 VITALS — BP 104/58 | HR 76 | Ht 66.0 in | Wt 160.0 lb

## 2020-10-05 DIAGNOSIS — R5383 Other fatigue: Secondary | ICD-10-CM

## 2020-10-05 DIAGNOSIS — E785 Hyperlipidemia, unspecified: Secondary | ICD-10-CM

## 2020-10-05 DIAGNOSIS — D649 Anemia, unspecified: Secondary | ICD-10-CM | POA: Diagnosis present

## 2020-10-05 DIAGNOSIS — I10 Essential (primary) hypertension: Secondary | ICD-10-CM

## 2020-10-05 DIAGNOSIS — R079 Chest pain, unspecified: Secondary | ICD-10-CM

## 2020-10-05 LAB — CBC WITH DIFFERENTIAL/PLATELET
Abs Immature Granulocytes: 0.04 10*3/uL (ref 0.00–0.07)
Basophils Absolute: 0.1 10*3/uL (ref 0.0–0.1)
Basophils Relative: 1 %
Eosinophils Absolute: 0.2 10*3/uL (ref 0.0–0.5)
Eosinophils Relative: 2 %
HCT: 39.7 % (ref 36.0–46.0)
Hemoglobin: 13 g/dL (ref 12.0–15.0)
Immature Granulocytes: 1 %
Lymphocytes Relative: 44 %
Lymphs Abs: 3.1 10*3/uL (ref 0.7–4.0)
MCH: 29.6 pg (ref 26.0–34.0)
MCHC: 32.7 g/dL (ref 30.0–36.0)
MCV: 90.4 fL (ref 80.0–100.0)
Monocytes Absolute: 0.7 10*3/uL (ref 0.1–1.0)
Monocytes Relative: 10 %
Neutro Abs: 2.9 10*3/uL (ref 1.7–7.7)
Neutrophils Relative %: 42 %
Platelets: 206 10*3/uL (ref 150–400)
RBC: 4.39 MIL/uL (ref 3.87–5.11)
RDW: 13.7 % (ref 11.5–15.5)
WBC: 7 10*3/uL (ref 4.0–10.5)
nRBC: 0 % (ref 0.0–0.2)

## 2020-10-05 NOTE — Patient Instructions (Signed)
Medication Instructions:  Your physician has recommended you make the following change in your medication:   Stop Taking Aspirin  Stop Taking Amlodipine  Stop Taking Atorvastatin   *If you need a refill on your cardiac medications before your next appointment, please call your pharmacy*   Lab Work: Your physician recommends that you return for lab work in: Today ( CBC)   If you have labs (blood work) drawn today and your tests are completely normal, you will receive your results only by: Marland Kitchen MyChart Message (if you have MyChart) OR . A paper copy in the mail If you have any lab test that is abnormal or we need to change your treatment, we will call you to review the results.   Testing/Procedures: NONE     Follow-Up: At [facility], you and your health needs are our priority.  As part of our continuing mission to provide you with exceptional heart care, we have created designated Provider Care Teams.  These Care Teams include your primary Cardiologist (physician) and Advanced Practice Providers (APPs -  Physician Assistants and Nurse Practitioners) who all work together to provide you with the care you need, when you need it.  We recommend signing up for the patient portal called "MyChart".  Sign up information is provided on this After Visit Summary.  MyChart is used to connect with patients for Virtual Visits (Telemedicine).  Patients are able to view lab/test results, encounter notes, upcoming appointments, etc.  Non-urgent messages can be sent to your provider as well.   To learn more about what you can do with MyChart, go to [email or web address].    Your next appointment:   6 month(s)  The format for your next appointment:   In Person  Provider:   With MD    Other Instructions Follow up with PCP for further evaluation of weakness It may be helpful to have thyroid checked but you will need to be off biotin supplement 1 week prior.   Thank you for choosing [facility]!

## 2020-11-23 ENCOUNTER — Encounter (INDEPENDENT_AMBULATORY_CARE_PROVIDER_SITE_OTHER): Payer: Self-pay | Admitting: *Deleted

## 2020-12-21 ENCOUNTER — Ambulatory Visit (INDEPENDENT_AMBULATORY_CARE_PROVIDER_SITE_OTHER): Payer: Medicare HMO | Admitting: Gastroenterology

## 2020-12-21 ENCOUNTER — Encounter (INDEPENDENT_AMBULATORY_CARE_PROVIDER_SITE_OTHER): Payer: Self-pay | Admitting: Gastroenterology

## 2020-12-21 ENCOUNTER — Other Ambulatory Visit: Payer: Self-pay

## 2020-12-21 VITALS — BP 96/60 | HR 76 | Temp 98.5°F | Ht 66.0 in | Wt 152.7 lb

## 2020-12-21 DIAGNOSIS — K581 Irritable bowel syndrome with constipation: Secondary | ICD-10-CM | POA: Diagnosis not present

## 2020-12-21 MED ORDER — PROMETHAZINE HCL 12.5 MG PO TABS: 12.5000 mg | ORAL_TABLET | Freq: Four times a day (QID) | ORAL | 0 refills | Status: DC | PRN

## 2020-12-21 MED ORDER — PEG 3350-KCL-NA BICARB-NACL 420 G PO SOLR
4000.0000 mL | Freq: Once | ORAL | 0 refills | Status: AC
Start: 2020-12-21 — End: 2020-12-21

## 2020-12-21 NOTE — Patient Instructions (Signed)
Take bowel prep during two days Start taking Miralax 3 capfuls every day  Continue Senna 2 tablets per day If worsening diarrhea (more than 4 bowel movemetns per day), stop the Senna

## 2020-12-21 NOTE — Progress Notes (Signed)
Traci Clark, M.D. Gastroenterology & Hepatology [facility] [address] [place], [place] [address]  Primary Care Physician: Lavella Lemons, [place] [address]  I will communicate my assessment and recommendations to the referring MD via EMR.  Problems: IBS-C  History of Present Illness: Traci Clark is a 87 y.o. female with PMH colon cancer s/p resection, diabetes, DVT, GERD,  gout, myocardial infarction, anxiety,who presents for follow up of IBS-C.  The patient was last seen on 08/03/2020. At that time, the patient was asked to start taking MiraLAX 1 capful every 8 hours and Amitiza 8 mcg every 12 hours.  However, Amitiza was not covered by the insurance and she was advised to take MiraLAX 3 times a day.  She was called for an EGD and a colonoscopy which she performed on 07/21/2020. Esophagogastroduodenospy showed grade a esophagitis, gastric polyps and normal duodenum.  Colonoscopy showed presence of a patent anastomosis in the ascending colon with normal mucosa, 3 polyps between 2 to 5 mm were removed from the ascending colon and descending colon.  Patient reports that she has presented persistent pain in her upper abdomen persistently, which is worse at night. She was hospitalzied at [facility] due to nausea and abdominal pain in her epigastric area on 08/2020.  A CT abdomen and pelvis with IV contrast was performed at that time and it was negative for any acute alteration. She was discharged on Reglan for presumed gastoparesis (no gastric emptying study performed) but she had to stop it as it"made her stomach jump constantly" and did not feel any improvement.  Reports that she could not afford on ondansetron anymore for her nausea. She feels nauseated on a daily basis.  Has not been able to take any other medications since then.  States she is only taking Miralax one capful a day and 2 pills of Senna as prescribed  by her PCP. Does not know why she did not take the Miralax TID. She is moving her bowels every week or so. States her bowels movements are explosive when she has a BM. Has not tried any Levsin or Bentyl.  She isi taking Dexilant, but does into help for her abdominal pain.   The patient denies having any nausea, vomiting, fever, chills, hematochezia, melena, hematemesis, abdominal distention, diarrhea, jaundice, pruritus or weight loss.  Last EGD: As above Last Colonoscopy: As above  Past Medical History: Past Medical History:  Diagnosis Date   Anxiety    Arthritis    Chronic constipation    CKD (chronic kidney disease), stage III ([place])    Colon cancer (HCC)    colon   Complication of anesthesia    Diabetes mellitus without complication ([place])    DVT (deep vein thrombosis) in pregnancy    Dysphagia    GERD (gastroesophageal reflux disease)    Glaucoma    Gout    H/O hiatal hernia    History of kidney stones    Iliac artery aneurysm, bilateral (HCC)    MI (myocardial infarction) ([place]) 1993   PONV (postoperative nausea and vomiting)    Restless leg syndrome    Seizure ([place])    withdrawal from xanax   Vertigo     Past Surgical History: Past Surgical History:  Procedure Laterality Date   BIOPSY  07/21/2020   Procedure: BIOPSY;  Surgeon: Harvel Quale, MD;  [facility];  Service: Gastroenterology;;   [facility]  COLECTOMY  2003   COLONOSCOPY N/A 07/29/2012   Procedure: COLONOSCOPY;  Surgeon: Rogene Houston, MD;  [facility];  Service: Endoscopy;  Laterality: N/A;  100   COLONOSCOPY WITH PROPOFOL N/A 12/13/2016   Procedure: COLONOSCOPY WITH PROPOFOL;  Surgeon: Rogene Houston, MD;  [facility];  Service: Endoscopy;  Laterality: N/A;  8:30   COLONOSCOPY WITH PROPOFOL N/A 07/21/2020   Procedure: COLONOSCOPY WITH PROPOFOL;  Surgeon: Harvel Quale, MD;  [facility];  Service: Gastroenterology;  Laterality:  N/A;  AM   ESOPHAGEAL DILATION  11/03/2015   Procedure: ESOPHAGEAL DILATION;  Surgeon: Rogene Houston, MD;  [facility];  Service: Endoscopy;;   ESOPHAGEAL DILATION N/A 09/26/2017   Procedure: ESOPHAGEAL DILATION;  Surgeon: Rogene Houston, MD;  [facility];  Service: Endoscopy;  Laterality: N/A;   ESOPHAGOGASTRODUODENOSCOPY N/A 03/25/2014   Procedure: ESOPHAGOGASTRODUODENOSCOPY (EGD);  Surgeon: Rogene Houston, MD;  [facility];  Service: Endoscopy;  Laterality: N/A;  1055   ESOPHAGOGASTRODUODENOSCOPY N/A 11/03/2015   Procedure: ESOPHAGOGASTRODUODENOSCOPY (EGD);  Surgeon: Rogene Houston, MD;  [facility];  Service: Endoscopy;  Laterality: N/A;  1:55 - moved to 11:15 - Ann to notify - moved to 12:45 - pt knows to arrive at 11:45   ESOPHAGOGASTRODUODENOSCOPY N/A 09/26/2017   Procedure: ESOPHAGOGASTRODUODENOSCOPY (EGD);  Surgeon: Rogene Houston, MD;  [facility];  Service: Endoscopy;  Laterality: N/A;   ESOPHAGOGASTRODUODENOSCOPY (EGD) WITH PROPOFOL N/A 07/21/2020   Procedure: ESOPHAGOGASTRODUODENOSCOPY (EGD) WITH PROPOFOL;  Surgeon: Harvel Quale, MD;  [facility];  Service: Gastroenterology;  Laterality: N/A;   MALONEY DILATION N/A 03/25/2014   Procedure: Venia Minks DILATION;  Surgeon: Rogene Houston, MD;  [facility];  Service: Endoscopy;  Laterality: N/A;   POLYPECTOMY  12/13/2016   Procedure: POLYPECTOMY;  Surgeon: Rogene Houston, MD;  [facility];  Service: Endoscopy;;  polyp   POLYPECTOMY  07/21/2020   Procedure: POLYPECTOMY;  Surgeon: Harvel Quale, MD;  [facility];  Service: Gastroenterology;;   TOTAL HIP ARTHROPLASTY Left    TUBAL LIGATION     VEIN SURGERY Bilateral    stripped vein due to DVT    Family History: Family History  Problem Relation Age of Onset   Heart disease Father     Social History: Social History   Tobacco Use  Smoking Status Never   Smokeless Tobacco Never   Social History   Substance and Sexual Activity  Alcohol Use No   Alcohol/week: 0.0 standard drinks   Social History   Substance and Sexual Activity  Drug Use No    Allergies: Allergies  Allergen Reactions   Amoxicillin Anaphylaxis and Rash    "Skin peeled off"   Macrobid [Nitrofurantoin Monohyd Macro] Anaphylaxis, Rash and Other (See Comments)    "Skin peeled off"   Codeine Nausea And Vomiting and Other (See Comments)    shaking   Darvon [Propoxyphene] Nausea And Vomiting and Other (See Comments)    shaking   Gabapentin Nausea Only    Feel faint    Medications: Current Outpatient Medications  Medication Sig Dispense Refill   acetaminophen (TYLENOL) 325 MG tablet Take 650 mg by mouth every 6 (six) hours as needed for pain.     allopurinol (ZYLOPRIM) 300 MG tablet Take 300 mg by mouth daily.     ALPRAZolam (XANAX) 0.5 MG tablet Take 1 tablet (0.5 mg total) by  mouth 3 (three) times daily as needed. (Patient taking differently: Take 0.5 mg by mouth 3 (three) times daily as needed for anxiety.)     BIOTIN PO Take 1 capsule by mouth daily.     dexlansoprazole (DEXILANT) 60 MG capsule Take 60 mg by mouth daily.     fluticasone (FLONASE) 50 MCG/ACT nasal spray Place 2 sprays into both nostrils daily.     furosemide (LASIX) 20 MG tablet Take 20 mg by mouth daily.     insulin aspart (NOVOLOG FLEXPEN RELION) 100 UNIT/ML FlexPen Inject 4-10 Units into the skin 3 (three) times daily with meals. Per sliding scale     Insulin Glargine (BASAGLAR KWIKPEN [place]) Inject 12 Units into the skin at bedtime.     latanoprost (XALATAN) 0.005 % ophthalmic solution Place 1 drop into both eyes at bedtime.     Multiple Vitamins-Minerals (MULTIVITAMIN WITH MINERALS) tablet Take 1 tablet by mouth daily.     polyethylene glycol (MIRALAX / GLYCOLAX) packet Take 17 g by mouth daily.     potassium chloride (KLOR-CON) 10 MEQ tablet Take 10 mEq by mouth daily.     traMADol (ULTRAM)  50 MG tablet Take 50 mg by mouth every 6 (six) hours as needed for severe pain.     Travoprost, BAK Free, (TRAVATAN) 0.004 % SOLN ophthalmic solution Place 1 drop into both eyes at bedtime.     fluticasone (VERAMYST) 27.5 MCG/SPRAY nasal spray Place 2 sprays into the nose daily.     lubiprostone (AMITIZA) 8 MCG capsule Take 1 capsule (8 mcg total) by mouth 2 (two) times daily with a meal. (Patient not taking: No sig reported) 180 capsule 3   metoCLOPramide (REGLAN) 5 MG tablet Take 1 tablet (5 mg total) by mouth 3 (three) times daily before meals. (Patient not taking: Reported on 12/21/2020) 90 tablet 0   nystatin (MYCOSTATIN) 100000 UNIT/ML suspension Take 5 mLs by mouth 4 (four) times daily. (Patient not taking: Reported on 12/21/2020)     No current facility-administered medications for this visit.    Review of Systems: GENERAL: negative for malaise, night sweats HEENT: No changes in hearing or vision, no nose bleeds or other nasal problems. NECK: Negative for lumps, goiter, pain and significant neck swelling RESPIRATORY: Negative for cough, wheezing CARDIOVASCULAR: Negative for chest pain, leg swelling, palpitations, orthopnea GI: SEE HPI MUSCULOSKELETAL: Negative for joint pain or swelling, back pain, and muscle pain. SKIN: Negative for lesions, rash PSYCH: Negative for sleep disturbance, mood disorder and recent psychosocial stressors. HEMATOLOGY Negative for prolonged bleeding, bruising easily, and swollen nodes. ENDOCRINE: Negative for cold or heat intolerance, polyuria, polydipsia and goiter. NEURO: negative for tremor, gait imbalance, syncope and seizures. The remainder of the review of systems is noncontributory.   Physical Exam: BP 96/60 (BP Location: Right Arm, Patient Position: Sitting, Cuff Size: Small)   Pulse 76   Temp 98.5 F (36.9 C) (Oral)   Ht '5\' 6"'$  (1.676 m)   Wt 152 lb 11.2 oz (69.3 kg)   BMI 24.65 kg/m  GENERAL: The patient is AO x3, in no acute distress.  Elder HEENT: Head is normocephalic and atraumatic. EOMI are intact. Mouth is well hydrated and without lesions. NECK: Supple. No masses LUNGS: Clear to auscultation. No presence of rhonchi/wheezing/rales. Adequate chest expansion HEART: RRR, normal s1 and s2. ABDOMEN: tender to palpation diffusely but no guarding, no peritoneal signs, and nondistended. BS +. No masses. EXTREMITIES: Without any cyanosis, clubbing, rash, lesions or edema. NEUROLOGIC: AOx3, no focal motor  deficit. SKIN: no jaundice, no rashes  Imaging/Labs: as above  I personally reviewed and interpreted the available labs, imaging and endoscopic files.  Impression and Plan: Traci Clark is a 87 y.o. female with PMH colon cancer s/p resection, diabetes, DVT, GERD,  gout, myocardial infarction, anxiety,who presents for follow up of IBS-C.  The patient has presented persistent constipation despite taking MiraLAX and senna.  However she has been taking less MiraLAX that was recommended in the past.  I encouraged her to take MiraLAX 3 capfuls per day along with senna.  Prior to starting this, I will send a prescription for a bowel prep.  If she develops very frequent bowel movements she can stop the senna at that time.  This will hopefully improve her nausea as well.  She has had negative endoscopic and imaging testing to explain her symptoms.  Unfortunately her insurance does not cover other stronger laxatives.  She may benefit from taking Phenergan as needed as it may be cheaper than the Zofran.  Patient understood and agreed.  - Take bowel prep during two days - Start taking Miralax 3 capfuls every day  - Continue Senna 2 tablets per day - Phenergan as needed for nausea - If worsening diarrhea (more than 4 bowel movements per day), stop the Senna  All questions were answered.      Harvel Quale, MD Gastroenterology and Hepatology [facility]

## 2020-12-22 ENCOUNTER — Telehealth (INDEPENDENT_AMBULATORY_CARE_PROVIDER_SITE_OTHER): Payer: Self-pay | Admitting: *Deleted

## 2020-12-22 NOTE — Telephone Encounter (Signed)
PA for promethazine sent through cover my meds today and got approval for med through 05/19/21. Approval letter faxed to pt's pharm.

## 2021-02-15 ENCOUNTER — Ambulatory Visit (INDEPENDENT_AMBULATORY_CARE_PROVIDER_SITE_OTHER): Payer: Medicare HMO | Admitting: Gastroenterology

## 2021-04-02 NOTE — Progress Notes (Deleted)
Cardiology Office Note    Date:  04/02/2021   ID:  Traci Clark, DOB [birth year], MRN [id number]  PCP:  Lavella Lemons, PA  Cardiologist:  None (seen by locums) Electrophysiologist:  None   Chief Complaint: f/u chest pain  History of Present Illness:   Traci Clark is a 87 y.o. female with history of HTN, T2DM, GERD, gout, constipation, colon CA s/p prior surgery, anxiety, ?prior DVT, hiatal hernia, RLS, bilateral iliac artery aneurysm, CKD stage III who presents for follow-up. She was admitted 08/2020 with abdominal pain, nausea, vomiting and weight loss. She was noticing episodes of night sweats and chest heaviness at rest. hs-troponins were negative x3. She had AKI with Cr of 1.5 with baseline Cr reported 1.1. ECG with sinus rhythm, rate 53 bpm and incomplete LBBB. CXR was unremarkable. 2D echo was reassuring (EF 55-60%, grade 1 DD) and chest pain felt atypical, possibly GI in nature. She was started on statin and no further inpatient cardiac workup recommended. Her abdominal pain was felt possibly r/t gastroparesis. She had incidental finding of bilateral iliac artery aneurysm and has seen [facility] in follow-up for this. No further workup/surveillance felt needed.  Since last clinic visit,  She is seen back for follow-up accompanied by grandson. She generally hasn't felt well since discharge. From cardiac standpoint she is clinically stable. Chest pain has subsided and has not been an issue. Able to walk with walker without angina. Breathing stable, no edema. BP is running on the low-normal side, rechecked by me at 108/60. She complains of generalized malaise and leg fatigue since discharge. She also has a history of falls. She states she has since seen her PCP recently but did not think labs rechecked at that time. She has noticed episodic dark stools. She had EGD/colonoscopy in 07/2020 with esophagitis and colon polyps at that time. The last episode of black stools was yesterday.  Takes an MVI with iron in it. Appetite has been better and was able to gain a couple lbs back.   Labwork independently reviewed: 08/2020 Mg 1.7, LDL 117, K 4.7, Cr 1.10, Hgb 11.7, plt 188, troponin negx3, A1C 7.5 No TSH  Past Medical History:  Diagnosis Date   Anxiety    Arthritis    Chronic constipation    CKD (chronic kidney disease), stage III (HCC)    Colon cancer (HCC)    colon   Complication of anesthesia    Diabetes mellitus without complication ([place])    DVT (deep vein thrombosis) in pregnancy    Dysphagia    GERD (gastroesophageal reflux disease)    Glaucoma    Gout    H/O hiatal hernia    History of kidney stones    Iliac artery aneurysm, bilateral (HCC)    MI (myocardial infarction) ([place]) 1993   PONV (postoperative nausea and vomiting)    Restless leg syndrome    Seizure ([place])    withdrawal from xanax   Vertigo     Past Surgical History:  Procedure Laterality Date   BIOPSY  07/21/2020   Procedure: BIOPSY;  Surgeon: Harvel Quale, MD;  [facility];  Service: Gastroenterology;;   CHOLECYSTECTOMY     COLECTOMY  2003   COLONOSCOPY N/A 07/29/2012   Procedure: COLONOSCOPY;  Surgeon: Rogene Houston, MD;  [facility];  Service: Endoscopy;  Laterality: N/A;  100   COLONOSCOPY WITH PROPOFOL N/A 12/13/2016   Procedure: COLONOSCOPY WITH PROPOFOL;  Surgeon: Rogene Houston, MD;  [facility];  Service: Endoscopy;  Laterality: N/A;  8:30   COLONOSCOPY WITH PROPOFOL N/A 07/21/2020   Procedure: COLONOSCOPY WITH PROPOFOL;  Surgeon: Harvel Quale, MD;  [facility];  Service: Gastroenterology;  Laterality: N/A;  AM   ESOPHAGEAL DILATION  11/03/2015   Procedure: ESOPHAGEAL DILATION;  Surgeon: Rogene Houston, MD;  [facility];  Service: Endoscopy;;   ESOPHAGEAL DILATION N/A 09/26/2017   Procedure: ESOPHAGEAL DILATION;  Surgeon: Rogene Houston, MD;  [facility];  Service: Endoscopy;   Laterality: N/A;   ESOPHAGOGASTRODUODENOSCOPY N/A 03/25/2014   Procedure: ESOPHAGOGASTRODUODENOSCOPY (EGD);  Surgeon: Rogene Houston, MD;  [facility];  Service: Endoscopy;  Laterality: N/A;  1055   ESOPHAGOGASTRODUODENOSCOPY N/A 11/03/2015   Procedure: ESOPHAGOGASTRODUODENOSCOPY (EGD);  Surgeon: Rogene Houston, MD;  [facility];  Service: Endoscopy;  Laterality: N/A;  1:55 - moved to 11:15 - Ann to notify - moved to 12:45 - pt knows to arrive at 11:45   ESOPHAGOGASTRODUODENOSCOPY N/A 09/26/2017   Procedure: ESOPHAGOGASTRODUODENOSCOPY (EGD);  Surgeon: Rogene Houston, MD;  [facility];  Service: Endoscopy;  Laterality: N/A;   ESOPHAGOGASTRODUODENOSCOPY (EGD) WITH PROPOFOL N/A 07/21/2020   Procedure: ESOPHAGOGASTRODUODENOSCOPY (EGD) WITH PROPOFOL;  Surgeon: Harvel Quale, MD;  [facility];  Service: Gastroenterology;  Laterality: N/A;   MALONEY DILATION N/A 03/25/2014   Procedure: Venia Minks DILATION;  Surgeon: Rogene Houston, MD;  [facility];  Service: Endoscopy;  Laterality: N/A;   POLYPECTOMY  12/13/2016   Procedure: POLYPECTOMY;  Surgeon: Rogene Houston, MD;  [facility];  Service: Endoscopy;;  polyp   POLYPECTOMY  07/21/2020   Procedure: POLYPECTOMY;  Surgeon: Montez Morita, Quillian Quince, MD;  [facility];  Service: Gastroenterology;;   TOTAL HIP ARTHROPLASTY Left    TUBAL LIGATION     VEIN SURGERY Bilateral    stripped vein due to DVT    Current Medications: No outpatient medications have been marked as taking for the 04/05/21 encounter (Appointment) with Donato Heinz, MD.      Allergies:   Amoxicillin, Macrobid [nitrofurantoin monohyd macro], Codeine, Darvon [propoxyphene], and Gabapentin   Social History   Socioeconomic History   Marital status: Married    Spouse name: Not on file   Number of children: Not on file   Years of education: Not on file   Highest education level:  Not on file  Occupational History   Not on file  Tobacco Use   Smoking status: Never   Smokeless tobacco: Never  Vaping Use   Vaping Use: Never used  Substance and Sexual Activity   Alcohol use: No    Alcohol/week: 0.0 standard drinks   Drug use: No   Sexual activity: Not Currently    Birth control/protection: Surgical  Other Topics Concern   Not on file  Social History Narrative   Not on file   Social Determinants of Health   Financial Resource Strain: Not on file  Food Insecurity: Not on file  Transportation Needs: Not on file  Physical Activity: Not on file  Stress: Not on file  Social Connections: Not on file     Family History:  The patient's family history includes Heart disease in her father.  ROS:   Please see the history of present illness.  All other systems are reviewed and otherwise negative.    EKGs/Labs/Other Studies Reviewed:    Studies reviewed are outlined and summarized  above. Reports included below if pertinent.  2D echo 08/2020  1. Left ventricular ejection fraction, by estimation, is 55 to 60%. The  left ventricle has normal function. The left ventricle has no regional  wall motion abnormalities. There is mild left ventricular hypertrophy.  Left ventricular diastolic parameters  are consistent with Grade I diastolic dysfunction (impaired relaxation).   2. Right ventricular systolic function is normal. The right ventricular  size is normal. Tricuspid regurgitation signal is inadequate for assessing  PA pressure.   3. The mitral valve is grossly normal. No evidence of mitral valve  regurgitation.   4. The aortic valve is tricuspid. There is mild calcification of the  aortic valve. Aortic valve regurgitation is not visualized.   5. The inferior vena cava is normal in size with greater than 50%  respiratory variability, suggesting right atrial pressure of 3 mmHg.     EKG:  EKG is not ordered today  Recent Labs: 09/13/2020: ALT 13 09/14/2020:  BUN 9; Creatinine, Ser 1.10; Magnesium 1.7; Potassium 4.7; Sodium 140 10/05/2020: Hemoglobin 13.0; Platelets 206  Recent Lipid Panel    Component Value Date/Time   CHOL 186 09/14/2020 0455   TRIG 82 09/14/2020 0455   HDL 53 09/14/2020 0455   CHOLHDL 3.5 09/14/2020 0455   VLDL 16 09/14/2020 0455   LDLCALC 117 (H) 09/14/2020 0455    PHYSICAL EXAM:    VS:  There were no vitals taken for this visit.  BMI: There is no height or weight on file to calculate BMI.  GEN: Well nourished, well developed female in no acute distress HEENT: normocephalic, atraumatic Neck: no JVD, carotid bruits, or masses Cardiac: RRR; no murmurs, rubs, or gallops, no edema  Respiratory:  clear to auscultation bilaterally, normal work of breathing GI: soft, nontender, nondistended, + BS MS: no deformity or atrophy Skin: warm and dry, no rash Neuro:  Alert and Oriented x 3, Strength and sensation are intact, follows commands Psych: euthymic mood, full affect  Wt Readings from Last 3 Encounters:  12/21/20 152 lb 11.2 oz (69.3 kg)  10/05/20 160 lb (72.6 kg)  10/02/20 156 lb (70.8 kg)     ASSESSMENT & PLAN:   Recent chest pain - cardiac workup reassuring in the hospital with negative troponins and normal LV function by echo. In the absence of recurrent anginal-type symptoms, would recommend conservative monitoring for now. Will reserve consideration of stress testing if she has recurrence.  Fatigue - she reports generalized fatigue as well as a sense of leg fatigue since she was discharged from the hospital. SBP is low-normal so will stop amlodipine. Will also hold statin in case this is contributing to the feeling of leg weakness. She has no lower extremity erythema or edema. She also reports ongoing episodic black stools therefore I've asked her to stop aspirin for now. With her history of falling the risk just seems to outweigh the benefit. Will check CBC while she is here today. I advised f/u with primary care  for her issues as they can't otherwise be explained by any acute cardiac issue we are seeing.  Essential HTN - stop amlodipine as above. Patient instructed to monitor BP at home and call if tending to run >135 at home off amlodipine. If this is the case, can consider resuming amlodipine at lower dose.  Mild hyperlipidemia - patient reports sense of leg fatigue and general malaise since discharge. Was started on statin at that time. I have asked her to hold this for now  to evaluate for any clinical improvement and follow up further with primary care.    Medication Adjustments/Labs and Tests Ordered: Current medicines are reviewed at length with the patient today.  Concerns regarding medicines are outlined above. Medication changes, Labs and Tests ordered today are summarized above and listed in the Patient Instructions accessible in Encounters.    Signed, Donato Heinz, MD  04/02/2021 10:23 AM    [facility] [place]. [place], [place] [address] Ph: [phone]; Fax [phone]

## 2021-04-05 ENCOUNTER — Ambulatory Visit: Payer: Medicare HMO | Admitting: Cardiology

## 2021-05-03 ENCOUNTER — Ambulatory Visit: Payer: Medicare HMO | Admitting: Cardiology

## 2021-05-22 ENCOUNTER — Encounter (INDEPENDENT_AMBULATORY_CARE_PROVIDER_SITE_OTHER): Payer: Self-pay | Admitting: *Deleted

## 2021-06-13 ENCOUNTER — Other Ambulatory Visit (HOSPITAL_COMMUNITY): Payer: Self-pay | Admitting: Nephrology

## 2021-06-13 ENCOUNTER — Other Ambulatory Visit: Payer: Self-pay | Admitting: Nephrology

## 2021-06-13 DIAGNOSIS — I129 Hypertensive chronic kidney disease with stage 1 through stage 4 chronic kidney disease, or unspecified chronic kidney disease: Secondary | ICD-10-CM

## 2021-06-13 DIAGNOSIS — D638 Anemia in other chronic diseases classified elsewhere: Secondary | ICD-10-CM

## 2021-06-13 DIAGNOSIS — E1122 Type 2 diabetes mellitus with diabetic chronic kidney disease: Secondary | ICD-10-CM

## 2021-06-21 ENCOUNTER — Other Ambulatory Visit: Payer: Self-pay

## 2021-06-21 ENCOUNTER — Encounter: Payer: Self-pay | Admitting: Cardiology

## 2021-06-21 ENCOUNTER — Ambulatory Visit: Payer: Medicare HMO | Admitting: Cardiology

## 2021-06-21 DIAGNOSIS — I10 Essential (primary) hypertension: Secondary | ICD-10-CM

## 2021-06-21 DIAGNOSIS — I723 Aneurysm of iliac artery: Secondary | ICD-10-CM | POA: Diagnosis not present

## 2021-06-21 DIAGNOSIS — R002 Palpitations: Secondary | ICD-10-CM | POA: Diagnosis not present

## 2021-06-21 DIAGNOSIS — R079 Chest pain, unspecified: Secondary | ICD-10-CM

## 2021-06-21 NOTE — Patient Instructions (Signed)
Medication Instructions:  Your physician recommends that you continue on your current medications as directed. Please refer to the Current Medication list given to you today.   Labwork: None today  Testing/Procedures: None today  Follow-Up: 6 months  Any Other Special Instructions Will Be Listed Below (If Applicable).  If you need a refill on your cardiac medications before your next appointment, please call your pharmacy.  

## 2021-06-21 NOTE — Assessment & Plan Note (Addendum)
Felt rapid beats, hard to breath. Better now.  This occurred over a month ago.  She states that she did call but there was no availability.  Thankfully these have resolved.  I did state that we would have a low threshold for a ZIO monitor if the symptoms were to return.  I want to exclude the possibility of atrial fibrillation.  We will see her back in 6 months to make sure that she is doing well.  APP visit.

## 2021-06-21 NOTE — Assessment & Plan Note (Signed)
Overall reassuring echocardiogram.  Reassuring high-sensitivity troponins in the past.  Pain was felt to be more gastrointestinal.

## 2021-06-21 NOTE — Assessment & Plan Note (Addendum)
Previously incidentally discovered in 2022.  1.7 and 1.8 cm.  Seen by vascular surgery.  Given reassurance, no further surveillance necessary.

## 2021-06-21 NOTE — Progress Notes (Signed)
Cardiology Office Note:    Date:  06/21/2021   ID:  Traci Clark, DOB Aug 17, 1930, MRN [id number]  PCP:  Lavella Lemons, PA   [facility] Providers Cardiologist:  None   Referring MD: Lavella Lemons, PA    History of Present Illness:    Traci Clark is a 89 y.o. female here for the follow-up of atypical chest pain.  Last seen by Sharrell Ku on 10/05/2020.  In review of hospital records and office notes she was previously admitted in April 2022 with abdominal pain nausea vomiting weight loss night sweats and chest heaviness.  Her high-sensitivity troponins were negative x3.  She had creatinine of 1.5 at the time, AKI.  Baseline was 1.1.  Her EKG showed sinus rhythm rate 53 with incomplete left bundle branch block.  Echocardiogram showed reassuring EF of 60%.  Her chest pain at that time was felt to be possibly GI in nature.  Her abdominal discomfort was felt to be potentially gastroparesis.  From a vascular perspective she did have an incidental finding of bilateral iliac artery aneurysms and was seen by vascular for this.  No further work-up is needed.  Her chest pain had subsided.  She was having some difficulties with energy, falling.  Currently she is doing quite well.  A little over a month ago she was feeling some rapid heartbeats and felt some trouble breathing.  This has completely subsided.  No syncope no further chest pain.  She brought in her list of blood pressures.  For the most part they are excellent.  Her lowest I see is 94 systolic.  She has several that are in the 1 teens systolic range.  She did have 1 that was quite high at 175 but this was transient.  I think she is going to be fine without her amlodipine.  Her renal specialist did place her on allopurinol.   Past Medical History:  Diagnosis Date   Anxiety    Arthritis    Chronic constipation    CKD (chronic kidney disease), stage III (HCC)    Colon cancer (HCC)    colon   Complication of  anesthesia    Diabetes mellitus without complication ([place])    DVT (deep vein thrombosis) in pregnancy    Dysphagia    GERD (gastroesophageal reflux disease)    Glaucoma    Gout    H/O hiatal hernia    History of kidney stones    Iliac artery aneurysm, bilateral (HCC)    MI (myocardial infarction) ([place]) 1993   PONV (postoperative nausea and vomiting)    Restless leg syndrome    Seizure ([place])    withdrawal from xanax   Vertigo     Past Surgical History:  Procedure Laterality Date   BIOPSY  07/21/2020   Procedure: BIOPSY;  Surgeon: Harvel Quale, MD;  [facility];  Service: Gastroenterology;;   CHOLECYSTECTOMY     COLECTOMY  2003   COLONOSCOPY N/A 07/29/2012   Procedure: COLONOSCOPY;  Surgeon: Rogene Houston, MD;  [facility];  Service: Endoscopy;  Laterality: N/A;  100   COLONOSCOPY WITH PROPOFOL N/A 12/13/2016   Procedure: COLONOSCOPY WITH PROPOFOL;  Surgeon: Rogene Houston, MD;  [facility];  Service: Endoscopy;  Laterality: N/A;  8:30   COLONOSCOPY WITH PROPOFOL N/A 07/21/2020   Procedure: COLONOSCOPY WITH PROPOFOL;  Surgeon: Harvel Quale, MD;  [facility];  Service: Gastroenterology;  Laterality: N/A;  AM  ESOPHAGEAL DILATION  11/03/2015   Procedure: ESOPHAGEAL DILATION;  Surgeon: Rogene Houston, MD;  [facility];  Service: Endoscopy;;   ESOPHAGEAL DILATION N/A 09/26/2017   Procedure: ESOPHAGEAL DILATION;  Surgeon: Rogene Houston, MD;  [facility];  Service: Endoscopy;  Laterality: N/A;   ESOPHAGOGASTRODUODENOSCOPY N/A 03/25/2014   Procedure: ESOPHAGOGASTRODUODENOSCOPY (EGD);  Surgeon: Rogene Houston, MD;  [facility];  Service: Endoscopy;  Laterality: N/A;  1055   ESOPHAGOGASTRODUODENOSCOPY N/A 11/03/2015   Procedure: ESOPHAGOGASTRODUODENOSCOPY (EGD);  Surgeon: Rogene Houston, MD;  [facility];  Service: Endoscopy;  Laterality: N/A;  1:55 - moved to 11:15 -  Ann to notify - moved to 12:45 - pt knows to arrive at 11:45   ESOPHAGOGASTRODUODENOSCOPY N/A 09/26/2017   Procedure: ESOPHAGOGASTRODUODENOSCOPY (EGD);  Surgeon: Rogene Houston, MD;  [facility];  Service: Endoscopy;  Laterality: N/A;   ESOPHAGOGASTRODUODENOSCOPY (EGD) WITH PROPOFOL N/A 07/21/2020   Procedure: ESOPHAGOGASTRODUODENOSCOPY (EGD) WITH PROPOFOL;  Surgeon: Harvel Quale, MD;  [facility];  Service: Gastroenterology;  Laterality: N/A;   MALONEY DILATION N/A 03/25/2014   Procedure: Venia Minks DILATION;  Surgeon: Rogene Houston, MD;  [facility];  Service: Endoscopy;  Laterality: N/A;   POLYPECTOMY  12/13/2016   Procedure: POLYPECTOMY;  Surgeon: Rogene Houston, MD;  [facility];  Service: Endoscopy;;  polyp   POLYPECTOMY  07/21/2020   Procedure: POLYPECTOMY;  Surgeon: Montez Morita, Quillian Quince, MD;  [facility];  Service: Gastroenterology;;   TOTAL HIP ARTHROPLASTY Left    TUBAL LIGATION     VEIN SURGERY Bilateral    stripped vein due to DVT    Current Medications: Current Meds  Medication Sig   acetaminophen (TYLENOL) 325 MG tablet Take 650 mg by mouth every 6 (six) hours as needed for pain.   allopurinol (ZYLOPRIM) 300 MG tablet Take 100 mg by mouth daily.   ALPRAZolam (XANAX) 0.5 MG tablet Take 1 tablet (0.5 mg total) by mouth 3 (three) times daily as needed. (Patient taking differently: Take 0.5 mg by mouth 3 (three) times daily as needed for anxiety.)   BIOTIN PO Take 1 capsule by mouth daily.   dexlansoprazole (DEXILANT) 60 MG capsule Take 60 mg by mouth daily.   fluticasone (FLONASE) 50 MCG/ACT nasal spray Place 2 sprays into both nostrils daily.   fluticasone (VERAMYST) 27.5 MCG/SPRAY nasal spray Place 2 sprays into the nose daily.   furosemide (LASIX) 20 MG tablet Take 20 mg by mouth daily.   insulin aspart (NOVOLOG FLEXPEN RELION) 100 UNIT/ML FlexPen Inject 4-10 Units into the skin 3 (three) times daily  with meals. Per sliding scale   Insulin Glargine (BASAGLAR KWIKPEN [place]) Inject 12 Units into the skin at bedtime.   latanoprost (XALATAN) 0.005 % ophthalmic solution Place 1 drop into both eyes at bedtime.   Multiple Vitamins-Minerals (MULTIVITAMIN WITH MINERALS) tablet Take 1 tablet by mouth daily.   nystatin (MYCOSTATIN) 100000 UNIT/ML suspension Take 5 mLs by mouth 4 (four) times daily.   polyethylene glycol (MIRALAX / GLYCOLAX) packet Take 17 g by mouth daily.   potassium chloride (KLOR-CON) 10 MEQ tablet Take 10 mEq by mouth daily.   promethazine (PHENERGAN) 12.5 MG tablet Take 1 tablet (12.5 mg total) by mouth every 6 (six) hours as needed for nausea or vomiting.   traMADol (ULTRAM) 50 MG tablet Take 50 mg by mouth every 6 (six) hours as needed for severe pain.   Travoprost,  BAK Free, (TRAVATAN) 0.004 % SOLN ophthalmic solution Place 1 drop into both eyes at bedtime.     Allergies:   Amoxicillin, Macrobid [nitrofurantoin monohyd macro], Codeine, Darvon [propoxyphene], and Gabapentin   Social History   Socioeconomic History   Marital status: Married    Spouse name: Not on file   Number of children: Not on file   Years of education: Not on file   Highest education level: Not on file  Occupational History   Not on file  Tobacco Use   Smoking status: Never   Smokeless tobacco: Never  Vaping Use   Vaping Use: Never used  Substance and Sexual Activity   Alcohol use: No    Alcohol/week: 0.0 standard drinks   Drug use: No   Sexual activity: Not Currently    Birth control/protection: Surgical  Other Topics Concern   Not on file  Social History Narrative   Not on file   Social Determinants of Health   Financial Resource Strain: Not on file  Food Insecurity: Not on file  Transportation Needs: Not on file  Physical Activity: Not on file  Stress: Not on file  Social Connections: Not on file     Family History: The patient's family history includes Heart disease in her  father.  ROS:   Please see the history of present illness.    No fevers chills nausea vomiting syncope bleeding all other systems reviewed and are negative.  EKGs/Labs/Other Studies Reviewed:    The following studies were reviewed today: 2D echo 08/2020  1. Left ventricular ejection fraction, by estimation, is 55 to 60%. The  left ventricle has normal function. The left ventricle has no regional  wall motion abnormalities. There is mild left ventricular hypertrophy.  Left ventricular diastolic parameters  are consistent with Grade I diastolic dysfunction (impaired relaxation).   2. Right ventricular systolic function is normal. The right ventricular  size is normal. Tricuspid regurgitation signal is inadequate for assessing  PA pressure.   3. The mitral valve is grossly normal. No evidence of mitral valve  regurgitation.   4. The aortic valve is tricuspid. There is mild calcification of the  aortic valve. Aortic valve regurgitation is not visualized.   5. The inferior vena cava is normal in size with greater than 50%  respiratory variability, suggesting right atrial pressure of 3 mmHg.    Recent Labs: 09/13/2020: ALT 13 09/14/2020: BUN 9; Creatinine, Ser 1.10; Magnesium 1.7; Potassium 4.7; Sodium 140 10/05/2020: Hemoglobin 13.0; Platelets 206  Recent Lipid Panel    Component Value Date/Time   CHOL 186 09/14/2020 0455   TRIG 82 09/14/2020 0455   HDL 53 09/14/2020 0455   CHOLHDL 3.5 09/14/2020 0455   VLDL 16 09/14/2020 0455   LDLCALC 117 (H) 09/14/2020 0455     Risk Assessment/Calculations:              Physical Exam:    VS:  BP (!) 154/86    Pulse 74    Ht 5\' 6"  (1.676 m)    Wt 153 lb (69.4 kg)    SpO2 94%    BMI 24.69 kg/m     Wt Readings from Last 3 Encounters:  06/21/21 153 lb (69.4 kg)  12/21/20 152 lb 11.2 oz (69.3 kg)  10/05/20 160 lb (72.6 kg)     GEN:  Well nourished, well developed in no acute distress HEENT: Normal NECK: No JVD; No carotid  bruits LYMPHATICS: No lymphadenopathy CARDIAC: RRR, no murmurs, no rubs, gallops  RESPIRATORY:  Clear to auscultation without rales, wheezing or rhonchi  ABDOMEN: Soft, non-tender, non-distended MUSCULOSKELETAL:  No edema; No deformity  SKIN: Warm and dry NEUROLOGIC:  Alert and oriented x 3 PSYCHIATRIC:  Normal affect   ASSESSMENT:    1. Bilateral iliac artery aneurysm (HCC)   2. Essential hypertension   3. Chest pain, unspecified type   4. Palpitations    PLAN:    In order of problems listed above:  Bilateral iliac artery aneurysm (HCC) Previously incidentally discovered in 2022.  1.7 and 1.8 cm.  Seen by vascular surgery.  Given reassurance, no further surveillance necessary.   Essential hypertension Previously her amlodipine was stopped.  Blood pressure continued to be monitored.  Chest pain Overall reassuring echocardiogram.  Reassuring high-sensitivity troponins in the past.  Pain was felt to be more gastrointestinal.  Palpitations Felt rapid beats, hard to breath. Better now.  This occurred over a month ago.  She states that she did call but there was no availability.  Thankfully these have resolved.  I did state that we would have a low threshold for a ZIO monitor if the symptoms were to return.  I want to exclude the possibility of atrial fibrillation.  We will see her back in 6 months to make sure that she is doing well.  APP visit.     6 months with APP   Medication Adjustments/Labs and Tests Ordered: Current medicines are reviewed at length with the patient today.  Concerns regarding medicines are outlined above.  No orders of the defined types were placed in this encounter.  No orders of the defined types were placed in this encounter.   Patient Instructions  Medication Instructions:  Your physician recommends that you continue on your current medications as directed. Please refer to the Current Medication list given to you today.   Labwork: None  today  Testing/Procedures: None today  Follow-Up: 6 months  Any Other Special Instructions Will Be Listed Below (If Applicable).  If you need a refill on your cardiac medications before your next appointment, please call your pharmacy.    Signed, Candee Furbish, MD  06/21/2021 2:58 PM    [place]

## 2021-06-21 NOTE — Assessment & Plan Note (Signed)
Previously her amlodipine was stopped.  Blood pressure continued to be monitored.

## 2021-06-22 ENCOUNTER — Ambulatory Visit (HOSPITAL_COMMUNITY)
Admission: RE | Admit: 2021-06-22 | Discharge: 2021-06-22 | Disposition: A | Discharge status: Home / Self Care | Payer: Medicare HMO | Source: Ambulatory Visit | Attending: Nephrology | Admitting: Nephrology

## 2021-06-22 DIAGNOSIS — E1122 Type 2 diabetes mellitus with diabetic chronic kidney disease: Secondary | ICD-10-CM | POA: Diagnosis present

## 2021-06-22 DIAGNOSIS — D638 Anemia in other chronic diseases classified elsewhere: Secondary | ICD-10-CM | POA: Insufficient documentation

## 2021-06-22 DIAGNOSIS — I129 Hypertensive chronic kidney disease with stage 1 through stage 4 chronic kidney disease, or unspecified chronic kidney disease: Secondary | ICD-10-CM | POA: Insufficient documentation

## 2021-06-25 ENCOUNTER — Ambulatory Visit (INDEPENDENT_AMBULATORY_CARE_PROVIDER_SITE_OTHER): Payer: Medicare HMO | Admitting: Gastroenterology

## 2021-06-28 ENCOUNTER — Ambulatory Visit (INDEPENDENT_AMBULATORY_CARE_PROVIDER_SITE_OTHER): Payer: Medicare HMO | Admitting: Gastroenterology

## 2021-07-30 ENCOUNTER — Encounter (INDEPENDENT_AMBULATORY_CARE_PROVIDER_SITE_OTHER): Payer: Self-pay | Admitting: Gastroenterology

## 2021-07-30 ENCOUNTER — Other Ambulatory Visit: Payer: Self-pay

## 2021-07-30 ENCOUNTER — Ambulatory Visit (INDEPENDENT_AMBULATORY_CARE_PROVIDER_SITE_OTHER): Payer: Medicare HMO | Admitting: Gastroenterology

## 2021-07-30 VITALS — BP 123/80 | HR 66 | Temp 99.0°F | Ht 66.0 in | Wt 150.2 lb

## 2021-07-30 DIAGNOSIS — H8111 Benign paroxysmal vertigo, right ear: Secondary | ICD-10-CM | POA: Diagnosis not present

## 2021-07-30 DIAGNOSIS — K581 Irritable bowel syndrome with constipation: Secondary | ICD-10-CM

## 2021-07-30 MED ORDER — MECLIZINE HCL 25 MG PO TABS: 25.0000 mg | ORAL_TABLET | Freq: Three times a day (TID) | ORAL | 0 refills | Status: AC | PRN

## 2021-07-30 NOTE — Patient Instructions (Signed)
I have sent a short course of meclizine for you, please follow up with your PCP regarding your vertigo ?Please continue with miralax 2 capfuls per day, if you find that stools are harder or you are going longer without a BM than 3 days, you can add senna back in ?Please make sure you are drinking plenty of water ?Continue with protein shakes, you can do 2-3 per day, especially if you are not eating more than one meal a day. ? ?Follow up 6 months ?

## 2021-07-30 NOTE — Progress Notes (Cosign Needed)
Referring Provider: Lavella Lemons, PA Primary Care Physician:  Lavella Lemons, PA Primary GI Physician: Jenetta Downer  Chief Complaint  Patient presents with   Constipation    6 month follow up. Was having diarrhea at last visit. Now has a BM every 3 -4 days, states stool was dark until February and now back to normal. Takes 2 capfuls of miralax every day. Concerns about dizziness. Started 2 weeks ago, right ear pain. Asking for meclizine.    HPI:   Traci Clark is a 88 y.o. female with past medical history of colon cancer s/p resection, diabetes, DVT, GERD,  gout, myocardial infarction, anxiety  Patient presenting today for follow up of IBS. Last seen august 2022 for constipation.   Previously on miralax and prescribed amitiza but unable to get amitiza due to pricing, still having infrequent BMs at last visit, given bowel prep, advised to take Miralax 3 capfuls daily and senna 2 tablets per day, thereafter.   Today, patient States that she has a BM ever 2-3 days and does not have to strain. She is doing Miralax 2 capfuls per day. She was previously on 3 capfuls of Miralax but was having trouble getting to the restroom. States that she is no longer taking the senna as she does not feel that she needs it. She denies any episodes of diarrhea. She states that she has some abdominal pain mid to lower abdomen, at times, which improves with a BM. She states that appetite is not great as she often does not feel hungry but this has been an ongoing thing for her. She eats maybe two boiled eggs in the morning and does a protein shake sometime later in the day. She has some nausea but no vomiting, uses zofran PRN which helps. Feels that acid reflux is well controlled on Dexilant '60mg'$  Daily. Denies rectal bleeding. She states that after her last visit she was having some black stools but this has subsided.   She tells me that she has some R ear pain and is feeling dizzy at times when she stands,  occasionally she feels like the room is spinning. States that symptoms have been present for the past 3-4 weeks. She has hx of vertigo. Denies any other upper respiratory symptoms, took meclizine previously with good result.   Last Colonoscopy:07/21/20 presence of a patent anastomosis in the ascending colon with normal mucosa, 3 polyps between 2 to 5 mm were removed from the ascending colon and descending colon. Last Endoscopy:07/21/20 grade a esophagitis, gastric polyps and normal duodenum.   Past Medical History:  Diagnosis Date   Anxiety    Arthritis    Chronic constipation    CKD (chronic kidney disease), stage III (HCC)    Colon cancer (HCC)    colon   Complication of anesthesia    Diabetes mellitus without complication ([place])    DVT (deep vein thrombosis) in pregnancy    Dysphagia    GERD (gastroesophageal reflux disease)    Glaucoma    Gout    H/O hiatal hernia    History of kidney stones    Iliac artery aneurysm, bilateral (HCC)    MI (myocardial infarction) ([place]) 1993   PONV (postoperative nausea and vomiting)    Restless leg syndrome    Seizure ([place])    withdrawal from xanax   Vertigo     Past Surgical History:  Procedure Laterality Date   BIOPSY  07/21/2020   Procedure: BIOPSY;  Surgeon: Harvel Quale,  MD;  [facility];  Service: Gastroenterology;;   CHOLECYSTECTOMY     COLECTOMY  2003   COLONOSCOPY N/A 07/29/2012   Procedure: COLONOSCOPY;  Surgeon: Rogene Houston, MD;  [facility];  Service: Endoscopy;  Laterality: N/A;  100   COLONOSCOPY WITH PROPOFOL N/A 12/13/2016   Procedure: COLONOSCOPY WITH PROPOFOL;  Surgeon: Rogene Houston, MD;  [facility];  Service: Endoscopy;  Laterality: N/A;  8:30   COLONOSCOPY WITH PROPOFOL N/A 07/21/2020   Procedure: COLONOSCOPY WITH PROPOFOL;  Surgeon: Harvel Quale, MD;  [facility];  Service: Gastroenterology;  Laterality: N/A;  AM   ESOPHAGEAL DILATION  11/03/2015    Procedure: ESOPHAGEAL DILATION;  Surgeon: Rogene Houston, MD;  [facility];  Service: Endoscopy;;   ESOPHAGEAL DILATION N/A 09/26/2017   Procedure: ESOPHAGEAL DILATION;  Surgeon: Rogene Houston, MD;  [facility];  Service: Endoscopy;  Laterality: N/A;   ESOPHAGOGASTRODUODENOSCOPY N/A 03/25/2014   Procedure: ESOPHAGOGASTRODUODENOSCOPY (EGD);  Surgeon: Rogene Houston, MD;  [facility];  Service: Endoscopy;  Laterality: N/A;  1055   ESOPHAGOGASTRODUODENOSCOPY N/A 11/03/2015   Procedure: ESOPHAGOGASTRODUODENOSCOPY (EGD);  Surgeon: Rogene Houston, MD;  [facility];  Service: Endoscopy;  Laterality: N/A;  1:55 - moved to 11:15 - Ann to notify - moved to 12:45 - pt knows to arrive at 11:45   ESOPHAGOGASTRODUODENOSCOPY N/A 09/26/2017   Procedure: ESOPHAGOGASTRODUODENOSCOPY (EGD);  Surgeon: Rogene Houston, MD;  [facility];  Service: Endoscopy;  Laterality: N/A;   ESOPHAGOGASTRODUODENOSCOPY (EGD) WITH PROPOFOL N/A 07/21/2020   Procedure: ESOPHAGOGASTRODUODENOSCOPY (EGD) WITH PROPOFOL;  Surgeon: Harvel Quale, MD;  [facility];  Service: Gastroenterology;  Laterality: N/A;   MALONEY DILATION N/A 03/25/2014   Procedure: Venia Minks DILATION;  Surgeon: Rogene Houston, MD;  [facility];  Service: Endoscopy;  Laterality: N/A;   POLYPECTOMY  12/13/2016   Procedure: POLYPECTOMY;  Surgeon: Rogene Houston, MD;  [facility];  Service: Endoscopy;;  polyp   POLYPECTOMY  07/21/2020   Procedure: POLYPECTOMY;  Surgeon: Harvel Quale, MD;  [facility];  Service: Gastroenterology;;   TOTAL HIP ARTHROPLASTY Left    TUBAL LIGATION     VEIN SURGERY Bilateral    stripped vein due to DVT    Current Outpatient Medications  Medication Sig Dispense Refill   Acetaminophen 500 MG capsule Take by mouth. 2 gelcaps every 4 -6 hours prn     albuterol (VENTOLIN HFA) 108 (90 Base) MCG/ACT inhaler Inhale  into the lungs. prn     allopurinol (ZYLOPRIM) 100 MG tablet Take 100 mg by mouth daily.     ALPRAZolam (XANAX) 0.5 MG tablet Take 1 tablet (0.5 mg total) by mouth 3 (three) times daily as needed. (Patient taking differently: Take 0.5 mg by mouth 3 (three) times daily as needed for anxiety.)     dexlansoprazole (DEXILANT) 60 MG capsule Take 60 mg by mouth daily.     fluticasone (FLONASE) 50 MCG/ACT nasal spray Place 2 sprays into both nostrils daily.     fluticasone (VERAMYST) 27.5 MCG/SPRAY nasal spray Place 2 sprays into the nose daily.     furosemide (LASIX) 20 MG tablet Take 20 mg by mouth daily.     insulin aspart (NOVOLOG FLEXPEN RELION) 100 UNIT/ML FlexPen Inject 4-10 Units into the skin 3 (three) times daily with meals. Per sliding scale     Insulin Glargine (BASAGLAR  KWIKPEN ) Inject 12 Units into the skin at bedtime.     latanoprost (XALATAN) 0.005 % ophthalmic solution Place 1 drop into both eyes at bedtime.     ondansetron (ZOFRAN) 4 MG tablet Take 4 mg by mouth. 1 tablet every 6 -8 hours     polyethylene glycol (MIRALAX / GLYCOLAX) packet Take by mouth daily. 2 capfuls daily     potassium chloride (KLOR-CON) 10 MEQ tablet Take 10 mEq by mouth daily.     traMADol (ULTRAM) 50 MG tablet Take 50 mg by mouth 2 (two) times daily.     No current facility-administered medications for this visit.    Allergies as of 07/30/2021 - Review Complete 07/30/2021  Allergen Reaction Noted   Amoxicillin Anaphylaxis and Rash 10/31/2015   Macrobid [nitrofurantoin monohyd macro] Anaphylaxis, Rash, and Other (See Comments) 10/31/2015   Codeine Nausea And Vomiting and Other (See Comments) 07/14/2012   Darvon [propoxyphene] Nausea And Vomiting and Other (See Comments) 06/27/2014   Gabapentin Nausea Only 12/21/2020    Family History  Problem Relation Age of Onset   Heart disease Father     Social History   Socioeconomic History   Marital status: Married    Spouse name: Not on file   Number  of children: Not on file   Years of education: Not on file   Highest education level: Not on file  Occupational History   Not on file  Tobacco Use   Smoking status: Never    Passive exposure: Never   Smokeless tobacco: Never  Vaping Use   Vaping Use: Never used  Substance and Sexual Activity   Alcohol use: No    Alcohol/week: 0.0 standard drinks   Drug use: No   Sexual activity: Not Currently    Birth control/protection: Surgical  Other Topics Concern   Not on file  Social History Narrative   Not on file   Social Determinants of Health   Financial Resource Strain: Not on file  Food Insecurity: Not on file  Transportation Needs: Not on file  Physical Activity: Not on file  Stress: Not on file  Social Connections: Not on file   Review of systems General: negative for malaise, night sweats, fever, chills, weight loss +decreased appetite Neck: Negative for lumps, goiter, pain and significant neck swelling ENT: +R ear discomfort/dizziness Resp: Negative for cough, wheezing, dyspnea at rest CV: Negative for chest pain, leg swelling, palpitations, orthopnea GI: denies melena, hematochezia, nausea, vomiting, diarrhea, constipation, dysphagia, odyonophagia, early satiety or unintentional weight loss. +decreased appetite MSK: Negative for joint pain or swelling, back pain, and muscle pain. Derm: Negative for itching or rash Psych: Denies depression, anxiety, memory loss, confusion. No homicidal or suicidal ideation.  Heme: Negative for prolonged bleeding, bruising easily, and swollen nodes. Endocrine: Negative for cold or heat intolerance, polyuria, polydipsia and goiter. Neuro: negative for tremor, gait imbalance, syncope and seizures. The remainder of the review of systems is noncontributory.  Physical Exam: BP 123/80 (BP Location: Right Arm, Patient Position: Sitting, Cuff Size: Normal)    Pulse 66    Temp 99 F (37.2 C) (Oral)    Ht '5\' 6"'$  (1.676 m)    Wt 150 lb 3.2 oz (68.1  kg)    BMI 24.24 kg/m  General:   Alert and oriented. No distress noted. Pleasant and cooperative.  Head:  Normocephalic and atraumatic. Eyes:  Conjuctiva clear without scleral icterus. Ears: TM WNL, no retraction or erythema. Present light reflex Mouth:  Oral mucosa pink  and moist. Good dentition. No lesions. Heart: Normal rate and rhythm, s1 and s2 heart sounds present.  Lungs: Clear lung sounds in all lobes. Respirations equal and unlabored. Abdomen:  +BS, soft, non-tender and non-distended. No rebound or guarding. No HSM or masses noted. Derm: No palmar erythema or jaundice Msk:  Symmetrical without gross deformities. Normal posture. Extremities:  Without edema. Neurologic:  Alert and  oriented x4 Psych:  Alert and cooperative. Normal mood and affect.  Invalid input(s): 6 MONTHS   ASSESSMENT: Traci Clark is a 88 y.o. female presenting today for follow up of constipation.  Currently on 2 capfuls of miralax per day and having a BM every 2-3 days without having to strain, denies rectal bleeding or melena currently. Has some occasional abdominal pain but this improves with a BM. She continues to have a decreased appetite, has one small meal per day and a protein shake,Reassuringly weight is stable at 150 lbs, previously 152 lbs at last visit in august. I encouraged her to increase protein shakes to 2-3 per day if she is eating only one meal per day. She can continue miralax 2 capfuls per day with good result, should continue to stay well hydrated.  She also reports some R ear discomfort and some dizziness at times with the room spinning, has hx of vertigo for which she took meclizine for in the past but does not have any currently to take. Upon exam, no obvious erythema or purulence noted to TM. Will prescribe short course of meclizine for her but she needs to follow up with PCP for further evaluation of this and continued Rx of meclizine if this is needed longer term. She does take  benadryl sometimes at night, should avoid taking this with the meclizine.  No red flag symptoms. Patient denies melena, hematochezia, nausea, vomiting, diarrhea, constipation, dysphagia, odyonophagia, early satiety or weight loss.    PLAN:  Meclizine '25mg'$  TID PRN for vertigo, needs to follow up with PCP- avoid benadryl while taking this 2. Continue miralax BID 3. Stay well hydrated with plenty of water 4. Add senna in if stools become harder 5. Increase protein shakes to 2-3 per day  Follow Up: 6 months  Kimberla Driskill L. Alver Sorrow, MSN, APRN, AGNP-C Adult-Gerontology Nurse Practitioner [facility]

## 2021-12-19 ENCOUNTER — Ambulatory Visit (INDEPENDENT_AMBULATORY_CARE_PROVIDER_SITE_OTHER): Payer: Medicare HMO | Admitting: Student

## 2021-12-19 ENCOUNTER — Encounter: Payer: Self-pay | Admitting: Student

## 2021-12-19 VITALS — BP 132/74 | HR 64 | Ht 66.0 in | Wt 155.2 lb

## 2021-12-19 DIAGNOSIS — R600 Localized edema: Secondary | ICD-10-CM | POA: Diagnosis not present

## 2021-12-19 DIAGNOSIS — Z87898 Personal history of other specified conditions: Secondary | ICD-10-CM

## 2021-12-19 DIAGNOSIS — R002 Palpitations: Secondary | ICD-10-CM | POA: Diagnosis not present

## 2021-12-19 DIAGNOSIS — I10 Essential (primary) hypertension: Secondary | ICD-10-CM | POA: Diagnosis not present

## 2021-12-19 DIAGNOSIS — I723 Aneurysm of iliac artery: Secondary | ICD-10-CM

## 2021-12-19 NOTE — Patient Instructions (Signed)
Medication Instructions:  Your physician recommends that you continue on your current medications as directed. Please refer to the Current Medication list given to you today.  Call back lf you continue to have palpitations   *If you need a refill on your cardiac medications before your next appointment, please call your pharmacy*   Lab Work: NONE   If you have labs (blood work) drawn today and your tests are completely normal, you will receive your results only by: [place] (if you have MyChart) OR A paper copy in the mail If you have any lab test that is abnormal or we need to change your treatment, we will call you to review the results.   Testing/Procedures: NONE    Follow-Up: At [facility], you and your health needs are our priority.  As part of our continuing mission to provide you with exceptional heart care, we have created designated Provider Care Teams.  These Care Teams include your primary Cardiologist (physician) and Advanced Practice Providers (APPs -  Physician Assistants and Nurse Practitioners) who all work together to provide you with the care you need, when you need it.  We recommend signing up for the patient portal called "MyChart".  Sign up information is provided on this After Visit Summary.  MyChart is used to connect with patients for Virtual Visits (Telemedicine).  Patients are able to view lab/test results, encounter notes, upcoming appointments, etc.  Non-urgent messages can be sent to your provider as well.   To learn more about what you can do with MyChart, go to [email or web address].    Your next appointment:   1 year(s)  The format for your next appointment:   In Person  Provider:   You may see Candee Furbish, MD or one of the following Advanced Practice Providers on your designated Care Team:   Bernerd Pho, PA-C  Ermalinda Barrios, PA-C     Other Instructions Thank you for choosing [facility]!    Important Information  About [place]

## 2021-12-19 NOTE — Progress Notes (Signed)
Cardiology Office Note    Date:  12/19/2021   ID:  [name], DOB 1930/06/14, MRN [id number]  PCP:  Traci Lemons, PA  Cardiologist: Candee Furbish, MD    Chief Complaint  Patient presents with   Follow-up    6 month visit    History of Present Illness:    Traci Clark is a 88 y.o. female with past medical history of HTN, Type II DM, GERD, bilateral iliac artery aneurysm, history of colon cancer and Stage III CKD who presents to the office today for 61-monthfollow-up.  She was last examined by Dr. SMarlou Porchin 06/2021 and denied any recent chest pain. She had previously experienced chest pain which was felt to be of a GI etiology and possibly secondary to gastroparesis. She did report having intermittent palpitations but these had spontaneously resolved a month prior to her visit and it was recommended if she had recurrent symptoms, could place a ZIO monitor.  In talking with the patient and her daughter today, she reports still having intermittent abdominal discomfort and feels like this previously improved with medical therapy but symptoms have returned. She describes this as an overall discomfort along her epigastric and periumbilical region. Still having routine bowel movements but does have intermittent constipation and takes MiraLAX. She does not consume water routinely and we reviewed the importance of increased hydration while taking MiraLAX. She denies any recurrent chest pain.  Does report occasional palpitations but these are typically less than once a month and resolve within a few minutes. No specific orthopnea or PND. She does experience intermittent lower extremity edema and takes Lasix 20 mg daily per Nephrology.  Past Medical History:  Diagnosis Date   Anxiety    Arthritis    Chronic constipation    CKD (chronic kidney disease), stage III (HCC)    Colon cancer (HCC)    colon   Complication of anesthesia    Diabetes mellitus without complication ([place]     DVT (deep vein thrombosis) in pregnancy    Dysphagia    GERD (gastroesophageal reflux disease)    Glaucoma    Gout    H/O hiatal hernia    History of kidney stones    Iliac artery aneurysm, bilateral (HCC)    [place] infarction) [place] 1993   PONV (postoperative nausea and vomiting)    Restless leg syndrome    Seizure ([place]    withdrawal from xanax   Vertigo     Past Surgical History:  Procedure Laterality Date   BIOPSY  07/21/2020   Procedure: BIOPSY;  Surgeon: CHarvel Quale MD;  [facility];  Service: Gastroenterology;;   CHOLECYSTECTOMY     COLECTOMY  2003   COLONOSCOPY N/A 07/29/2012   Procedure: COLONOSCOPY;  Surgeon: NRogene Houston MD;  [facility];  Service: Endoscopy;  Laterality: N/A;  100   COLONOSCOPY WITH PROPOFOL N/A 12/13/2016   Procedure: COLONOSCOPY WITH PROPOFOL;  Surgeon: RRogene Houston MD;  [facility];  Service: Endoscopy;  Laterality: N/A;  8:30   COLONOSCOPY WITH PROPOFOL N/A 07/21/2020   Procedure: COLONOSCOPY WITH PROPOFOL;  Surgeon: CHarvel Quale MD;  [facility];  Service: Gastroenterology;  Laterality: N/A;  AM   ESOPHAGEAL DILATION  11/03/2015   Procedure: ESOPHAGEAL DILATION;  Surgeon: NRogene Houston MD;  [facility];  Service: Endoscopy;;   ESOPHAGEAL DILATION N/A 09/26/2017   Procedure: ESOPHAGEAL DILATION;  Surgeon: RRogene Houston  MD;  [facility];  Service: Endoscopy;  Laterality: N/A;   ESOPHAGOGASTRODUODENOSCOPY N/A 03/25/2014   Procedure: ESOPHAGOGASTRODUODENOSCOPY (EGD);  Surgeon: Rogene Houston, MD;  [facility];  Service: Endoscopy;  Laterality: N/A;  1055   ESOPHAGOGASTRODUODENOSCOPY N/A 11/03/2015   Procedure: ESOPHAGOGASTRODUODENOSCOPY (EGD);  Surgeon: Rogene Houston, MD;  [facility];  Service: Endoscopy;  Laterality: N/A;  1:55 - moved to 11:15 - Ann to notify - moved to 12:45 - pt knows to arrive at 11:45    ESOPHAGOGASTRODUODENOSCOPY N/A 09/26/2017   Procedure: ESOPHAGOGASTRODUODENOSCOPY (EGD);  Surgeon: Rogene Houston, MD;  [facility];  Service: Endoscopy;  Laterality: N/A;   ESOPHAGOGASTRODUODENOSCOPY (EGD) WITH PROPOFOL N/A 07/21/2020   Procedure: ESOPHAGOGASTRODUODENOSCOPY (EGD) WITH PROPOFOL;  Surgeon: Harvel Quale, MD;  [facility];  Service: Gastroenterology;  Laterality: N/A;   MALONEY DILATION N/A 03/25/2014   Procedure: Venia Minks DILATION;  Surgeon: Rogene Houston, MD;  [facility];  Service: Endoscopy;  Laterality: N/A;   POLYPECTOMY  12/13/2016   Procedure: POLYPECTOMY;  Surgeon: Rogene Houston, MD;  [facility];  Service: Endoscopy;;  polyp   POLYPECTOMY  07/21/2020   Procedure: POLYPECTOMY;  Surgeon: Harvel Quale, MD;  [facility];  Service: Gastroenterology;;   TOTAL HIP ARTHROPLASTY Left    TUBAL LIGATION     VEIN SURGERY Bilateral    stripped vein due to DVT    Current Medications: Outpatient Medications Prior to Visit  Medication Sig Dispense Refill   Acetaminophen 500 MG capsule Take by mouth. 2 gelcaps every 4 -6 hours prn     albuterol (VENTOLIN HFA) 108 (90 Base) MCG/ACT inhaler Inhale into the lungs. prn     allopurinol (ZYLOPRIM) 100 MG tablet Take 100 mg by mouth daily.     ALPRAZolam (XANAX) 0.5 MG tablet Take 1 tablet (0.5 mg total) by mouth 3 (three) times daily as needed. (Patient taking differently: Take 0.5 mg by mouth 3 (three) times daily as needed for anxiety.)     dexlansoprazole (DEXILANT) 60 MG capsule Take 60 mg by mouth daily.     fluticasone (FLONASE) 50 MCG/ACT nasal spray Place 2 sprays into both nostrils daily.     fluticasone (VERAMYST) 27.5 MCG/SPRAY nasal spray Place 2 sprays into the nose daily.     furosemide (LASIX) 20 MG tablet Take 20 mg by mouth daily.     insulin aspart (NOVOLOG FLEXPEN RELION) 100 UNIT/ML FlexPen Inject 4-10 Units into the skin 3 (three)  times daily with meals. Per sliding scale     Insulin Glargine (BASAGLAR KWIKPEN [place]) Inject 12 Units into the skin at bedtime.     latanoprost (XALATAN) 0.005 % ophthalmic solution Place 1 drop into both eyes at bedtime.     meclizine (ANTIVERT) 25 MG tablet Take 1 tablet (25 mg total) by mouth 3 (three) times daily as needed for dizziness. 30 tablet 0   ondansetron (ZOFRAN) 4 MG tablet Take 4 mg by mouth. 1 tablet every 6 -8 hours     polyethylene glycol (MIRALAX / GLYCOLAX) packet Take by mouth daily. 2 capfuls daily     potassium chloride (KLOR-CON) 10 MEQ tablet Take 10 mEq by mouth daily.     traMADol (ULTRAM) 50 MG tablet Take 50 mg by mouth 2 (two) times daily.     No facility-administered medications prior to visit.     Allergies:   Amoxicillin, Macrobid [nitrofurantoin monohyd macro], Codeine,  Darvon [propoxyphene], and Gabapentin   Social History   Socioeconomic History   Marital status: Married    Spouse name: Not on file   Number of children: Not on file   Years of education: Not on file   Highest education level: Not on file  Occupational History   Not on file  Tobacco Use   Smoking status: Never    Passive exposure: Never   Smokeless tobacco: Never  Vaping Use   Vaping Use: Never used  Substance and Sexual Activity   Alcohol use: No    Alcohol/week: 0.0 standard drinks of alcohol   Drug use: No   Sexual activity: Not Currently    Birth control/protection: Surgical  Other Topics Concern   Not on file  Social History Narrative   Not on file   Social Determinants of Health   Financial Resource Strain: Not on file  Food Insecurity: Not on file  Transportation Needs: Not on file  Physical Activity: Not on file  Stress: Not on file  Social Connections: Not on file     Family History:  The patient's family history includes Heart disease in her father.   Review of Systems:    Please see the history of present illness.     All other systems reviewed and  are otherwise negative except as noted above.   Physical Exam:    VS:  BP 132/74   Pulse 64   Ht '5\' 6"'$  (1.676 m)   Wt 155 lb 3.2 oz (70.4 kg)   SpO2 93%   BMI 25.05 kg/m    General: Pleasant elderly female appearing in no acute distress. Head: Normocephalic, atraumatic. Neck: No carotid bruits. JVD not elevated.  Lungs: Respirations regular and unlabored, without wheezes or rales.  Heart: Regular rate and rhythm. No S3 or S4.  No murmur, no rubs, or gallops appreciated. Abdomen: Appears non-distended. No obvious abdominal masses. Msk:  Strength and tone appear normal for age. No obvious joint deformities or effusions. Extremities: No clubbing or cyanosis. No pitting edema.  Distal pedal pulses are 2+ bilaterally. Neuro: Alert and oriented X 3. Moves all extremities spontaneously. No focal deficits noted. Psych:  Responds to questions appropriately with a normal affect. Skin: No rashes or lesions noted  Wt Readings from Last 3 Encounters:  12/19/21 155 lb 3.2 oz (70.4 kg)  07/30/21 150 lb 3.2 oz (68.1 kg)  06/21/21 153 lb (69.4 kg)     Studies/Labs Reviewed:   EKG:  EKG is ordered today. The ekg ordered today demonstrates normal sinus rhythm, heart rate 61 with first-degree AV block. ST abnormalities along the lateral leads which are overall similar to prior tracings.  Recent Labs: No results found for requested labs within last 365 days.   Lipid Panel    Component Value Date/Time   CHOL 186 09/14/2020 0455   TRIG 82 09/14/2020 0455   HDL 53 09/14/2020 0455   CHOLHDL 3.5 09/14/2020 0455   VLDL 16 09/14/2020 0455   LDLCALC 117 (H) 09/14/2020 0455    Additional studies/ records that were reviewed today include:   Echocardiogram: 09/14/2020 IMPRESSIONS     1. Left ventricular ejection fraction, by estimation, is 55 to 60%. The  left ventricle has normal function. The left ventricle has no regional  wall motion abnormalities. There is mild left ventricular  hypertrophy.  Left ventricular diastolic parameters  are consistent with Grade I diastolic dysfunction (impaired relaxation).   2. Right ventricular systolic function is normal. The right  ventricular  size is normal. Tricuspid regurgitation signal is inadequate for assessing  PA pressure.   3. The mitral valve is grossly normal. No evidence of mitral valve  regurgitation.   4. The aortic valve is tricuspid. There is mild calcification of the  aortic valve. Aortic valve regurgitation is not visualized.   5. The inferior vena cava is normal in size with greater than 50%  respiratory variability, suggesting right atrial pressure of 3 mmHg.   Assessment:    1. History of chest pain   2. Palpitations   3. Bilateral lower extremity edema   4. Essential hypertension   5. Bilateral iliac artery aneurysm (HCC)      Plan:   In order of problems listed above:  1. History of Chest Pain - She reports epigastric/periumbilical pain resembling her prior symptoms last year but no specific chest pain or dyspnea on exertion. She does experience intermittent constipation and I encouraged her to increase her water intake while taking MiraLAX and to follow-up with GI if symptoms do not improve as she is also on Dexilant 60 mg daily. - EKG today does show ST abnormalities along the lateral leads and is overall similar to prior tracings. Echocardiogram last year was reassuring and showed a preserved EF with no wall motion abnormalities.  Would not anticipate further ischemic testing at this time given her age and overall atypical symptoms.  2. Palpitations - She reports occasional symptoms but overall less than once per month and they resolve within a few seconds to minutes. I encouraged her to make [place] aware if symptoms progress in frequency or severity as a ZIO monitor can be placed as I worry we would not capture anything at this time given the infrequency of her symptoms. She did have recent labs in  10/2021 which showed normal hemoglobin and electrolytes.  3. Lower Extremity Edema - Symptoms have overall been well controlled on Lasix 20 mg daily. She is followed by Dr. Theador Hawthorne and creatinine was stable at 1.20 by recent labs in 10/2021.  4. HTN - She was previously on Amlodipine but this was discontinued last year secondary to soft BP. Her BP is at 132/74 during today's visit. She has kept a blood pressure log at home and SBP is typically in the low 100's to 110's. Would not restart Amlodipine at this time. She remains on Lasix 20 mg daily.  5. Iliac Artery Aneurysm - Noted on imaging in 2022 and measured at 1.7 and 1.8 cm. She was previously evaluated by Vascular Surgery and no follow-up imaging was recommended at that time.    Medication Adjustments/Labs and Tests Ordered: Current medicines are reviewed at length with the patient today.  Concerns regarding medicines are outlined above.  Medication changes, Labs and Tests ordered today are listed in the Patient Instructions below. Patient Instructions  Medication Instructions:  Your physician recommends that you continue on your current medications as directed. Please refer to the Current Medication list given to you today.  Call back lf you continue to have palpitations   *If you need a refill on your cardiac medications before your next appointment, please call your pharmacy*   Lab Work: NONE   If you have labs (blood work) drawn today and your tests are completely normal, you will receive your results only by: [place] (if you have MyChart) OR A paper copy in the mail If you have any lab test that is abnormal or we need to change your treatment, we will  call you to review the results.   Testing/Procedures: NONE    Follow-Up: At [facility], you and your health needs are our priority.  As part of our continuing mission to provide you with exceptional heart care, we have created designated Provider Care Teams.   These Care Teams include your primary Cardiologist (physician) and Advanced Practice Providers (APPs -  Physician Assistants and Nurse Practitioners) who all work together to provide you with the care you need, when you need it.  We recommend signing up for the patient portal called "MyChart".  Sign up information is provided on this After Visit Summary.  MyChart is used to connect with patients for Virtual Visits (Telemedicine).  Patients are able to view lab/test results, encounter notes, upcoming appointments, etc.  Non-urgent messages can be sent to your provider as well.   To learn more about what you can do with MyChart, go to [email or web address].    Your next appointment:   1 year(s)  The format for your next appointment:   In Person  Provider:   You may see Candee Furbish, MD or one of the following Advanced Practice Providers on your designated Care Team:   Bernerd Pho, PA-C  Ermalinda Barrios, PA-C     Other Instructions Thank you for choosing [facility]!    Important Information About Sugar         Signed, Erma Heritage, PA-C  12/19/2021 1:49 PM    [facility] [address] [place], [place] [address] Phone: [phone] Fax: [phone]

## 2022-02-04 ENCOUNTER — Other Ambulatory Visit (INDEPENDENT_AMBULATORY_CARE_PROVIDER_SITE_OTHER): Payer: Self-pay

## 2022-02-04 ENCOUNTER — Ambulatory Visit (INDEPENDENT_AMBULATORY_CARE_PROVIDER_SITE_OTHER): Payer: Medicare HMO | Admitting: Gastroenterology

## 2022-02-04 ENCOUNTER — Encounter (INDEPENDENT_AMBULATORY_CARE_PROVIDER_SITE_OTHER): Payer: Self-pay

## 2022-02-04 ENCOUNTER — Encounter (INDEPENDENT_AMBULATORY_CARE_PROVIDER_SITE_OTHER): Payer: Self-pay | Admitting: Gastroenterology

## 2022-02-04 VITALS — BP 171/84 | HR 77 | Temp 97.9°F | Ht 66.0 in | Wt 159.4 lb

## 2022-02-04 DIAGNOSIS — R1013 Epigastric pain: Secondary | ICD-10-CM | POA: Diagnosis not present

## 2022-02-04 DIAGNOSIS — K921 Melena: Secondary | ICD-10-CM | POA: Diagnosis not present

## 2022-02-04 DIAGNOSIS — I10 Essential (primary) hypertension: Secondary | ICD-10-CM

## 2022-02-04 NOTE — Patient Instructions (Signed)
Please continue sucralafate three times a day as you are doing We will get you scheduled for EGD for further evaluation of black stool and abdominal pain  Follow up 3 months

## 2022-02-04 NOTE — Progress Notes (Signed)
Referring Provider: Lavella Lemons, PA Primary Care Physician:  Lavella Lemons, PA Primary GI Physician: Jenetta Downer   Chief Complaint  Patient presents with   Follow-up    Patient here today for a follow up visit. She says she has some mid to upper epigastric pain at night. She says she looses her breath and starts dreaming bad dreams. She reports her pcp put her on sucralfate 10 ml three times per day which has seemed to have helped.   HPI:   Traci Clark is a 88 y.o. female with past medical history of colon cancer s/p resection, diabetes, DVT, GERD,  gout, myocardial infarction, anxiety  Patient presenting today for follow up.  Last seen 07/30/21, at that time having a BM every 2-3 days, doing miralax 2 capfuls per day. Having some abdominal pain improved with BMs. Having some nausea but no vomiting, using zofran PRN.   Recommended to continue miralax BID, stay well hydrated, add senna if stool becomes harder, increase protein shakes to 2-3x/day, meclizine '25mg'$  TID prn for vertigo given for some ear pain and dizziness when standing.  Present:  Patient reports epigastric pain that occurs at night when lying down, states that she would have nightmares initially when the pain occurred. Denies pain during the day.  No pain after eating or issues with appetite. PCP started her on sucralafate which helped some.  She endorses occasional black stools, no pepto bismol use. She states that appetite is not great she just does not feel hungry. She is doing a protein shake in the morning. She has had some diarrhea after eating for the past 3-4 weeks. Typically having a BM every 3-4 days with miralax, which also makes her nauseated but she takes anyways. She may now have 2-3 episodes of diarrhea per day with loose stools and some fecal urgency. Also tells me she sometimes alternates between constipation and diarrhea. Diarrhea Does not seem to depend on what she eats. She denies any heart burn or  acid regurgitation. She denies dysphagia or odynophagia, she does note some early satiety at times as well. Denies any NSAID use. No weight loss.   Last Colonoscopy:07/21/20 presence of a patent anastomosis in the ascending colon with normal mucosa, 3 polyps between 2 to 5 mm were removed from the ascending colon and descending colon. Last Endoscopy:07/21/20 grade A esophagitis, gastric polyps and normal duodenum.   Past Medical History:  Diagnosis Date   Anxiety    Arthritis    Chronic constipation    CKD (chronic kidney disease), stage III (HCC)    Colon cancer (HCC)    colon   Complication of anesthesia    Diabetes mellitus without complication ([place])    DVT (deep vein thrombosis) in pregnancy    Dysphagia    GERD (gastroesophageal reflux disease)    Glaucoma    Gout    H/O hiatal hernia    History of kidney stones    Iliac artery aneurysm, bilateral (HCC)    MI (myocardial infarction) ([place]) 1993   PONV (postoperative nausea and vomiting)    Restless leg syndrome    Seizure ([place])    withdrawal from xanax   Vertigo     Past Surgical History:  Procedure Laterality Date   BIOPSY  07/21/2020   Procedure: BIOPSY;  Surgeon: Harvel Quale, MD;  [facility];  Service: Gastroenterology;;   CHOLECYSTECTOMY     COLECTOMY  2003   COLONOSCOPY N/A 07/29/2012   Procedure:  COLONOSCOPY;  Surgeon: Rogene Houston, MD;  [facility];  Service: Endoscopy;  Laterality: N/A;  100   COLONOSCOPY WITH PROPOFOL N/A 12/13/2016   Procedure: COLONOSCOPY WITH PROPOFOL;  Surgeon: Rogene Houston, MD;  [facility];  Service: Endoscopy;  Laterality: N/A;  8:30   COLONOSCOPY WITH PROPOFOL N/A 07/21/2020   Procedure: COLONOSCOPY WITH PROPOFOL;  Surgeon: Harvel Quale, MD;  [facility];  Service: Gastroenterology;  Laterality: N/A;  AM   ESOPHAGEAL DILATION  11/03/2015   Procedure: ESOPHAGEAL DILATION;  Surgeon: Rogene Houston, MD;  [facility];  Service: Endoscopy;;   ESOPHAGEAL DILATION N/A 09/26/2017   Procedure: ESOPHAGEAL DILATION;  Surgeon: Rogene Houston, MD;  [facility];  Service: Endoscopy;  Laterality: N/A;   ESOPHAGOGASTRODUODENOSCOPY N/A 03/25/2014   Procedure: ESOPHAGOGASTRODUODENOSCOPY (EGD);  Surgeon: Rogene Houston, MD;  [facility];  Service: Endoscopy;  Laterality: N/A;  1055   ESOPHAGOGASTRODUODENOSCOPY N/A 11/03/2015   Procedure: ESOPHAGOGASTRODUODENOSCOPY (EGD);  Surgeon: Rogene Houston, MD;  [facility];  Service: Endoscopy;  Laterality: N/A;  1:55 - moved to 11:15 - Ann to notify - moved to 12:45 - pt knows to arrive at 11:45   ESOPHAGOGASTRODUODENOSCOPY N/A 09/26/2017   Procedure: ESOPHAGOGASTRODUODENOSCOPY (EGD);  Surgeon: Rogene Houston, MD;  [facility];  Service: Endoscopy;  Laterality: N/A;   ESOPHAGOGASTRODUODENOSCOPY (EGD) WITH PROPOFOL N/A 07/21/2020   Procedure: ESOPHAGOGASTRODUODENOSCOPY (EGD) WITH PROPOFOL;  Surgeon: Harvel Quale, MD;  [facility];  Service: Gastroenterology;  Laterality: N/A;   MALONEY DILATION N/A 03/25/2014   Procedure: Venia Minks DILATION;  Surgeon: Rogene Houston, MD;  [facility];  Service: Endoscopy;  Laterality: N/A;   POLYPECTOMY  12/13/2016   Procedure: POLYPECTOMY;  Surgeon: Rogene Houston, MD;  [facility];  Service: Endoscopy;;  polyp   POLYPECTOMY  07/21/2020   Procedure: POLYPECTOMY;  Surgeon: Harvel Quale, MD;  [facility];  Service: Gastroenterology;;   TOTAL HIP ARTHROPLASTY Left    TUBAL LIGATION     VEIN SURGERY Bilateral    stripped vein due to DVT    Current Outpatient Medications  Medication Sig Dispense Refill   Acetaminophen 500 MG capsule Take by mouth. 2 gelcaps every 4 -6 hours prn     albuterol (VENTOLIN HFA) 108 (90 Base) MCG/ACT inhaler Inhale into the lungs. prn     allopurinol (ZYLOPRIM) 100 MG tablet Take 100 mg by  mouth daily.     ALPRAZolam (XANAX) 0.5 MG tablet Take 1 tablet (0.5 mg total) by mouth 3 (three) times daily as needed. (Patient taking differently: Take 0.5 mg by mouth 3 (three) times daily as needed for anxiety.)     dexlansoprazole (DEXILANT) 60 MG capsule Take 60 mg by mouth daily.     fluticasone (FLONASE) 50 MCG/ACT nasal spray Place 2 sprays into both nostrils daily.     fluticasone (VERAMYST) 27.5 MCG/SPRAY nasal spray Place 2 sprays into the nose daily.     furosemide (LASIX) 20 MG tablet Take 20 mg by mouth daily.     insulin aspart (NOVOLOG FLEXPEN RELION) 100 UNIT/ML FlexPen Inject 4-10 Units into the skin 3 (three) times daily with meals. Per sliding scale     Insulin Glargine (BASAGLAR KWIKPEN [place]) Inject 15 Units into the skin at bedtime.     latanoprost (XALATAN) 0.005 % ophthalmic solution Place 1 drop into both eyes at  bedtime.     LUTEIN PO Take 40 mg by mouth daily at 6 (six) AM.     meclizine (ANTIVERT) 25 MG tablet Take 1 tablet (25 mg total) by mouth 3 (three) times daily as needed for dizziness. 30 tablet 0   ondansetron (ZOFRAN) 4 MG tablet Take 4 mg by mouth. 1 tablet every 6 -8 hours     polyethylene glycol (MIRALAX / GLYCOLAX) packet Take by mouth daily. 2 capfuls daily     potassium chloride (KLOR-CON) 10 MEQ tablet Take 10 mEq by mouth daily.     sucralfate (CARAFATE) 1 GM/10ML suspension Take 1 g by mouth 4 (four) times daily -  with meals and at bedtime.     traMADol (ULTRAM) 50 MG tablet Take 50 mg by mouth 2 (two) times daily.     No current facility-administered medications for this visit.    Allergies as of 02/04/2022 - Review Complete 02/04/2022  Allergen Reaction Noted   Amoxicillin Anaphylaxis and Rash 10/31/2015   Macrobid [nitrofurantoin monohyd macro] Anaphylaxis, Rash, and Other (See Comments) 10/31/2015   Codeine Nausea And Vomiting and Other (See Comments) 07/14/2012   Darvon [propoxyphene] Nausea And Vomiting and Other (See Comments) 06/27/2014    Gabapentin Nausea Only 12/21/2020    Family History  Problem Relation Age of Onset   Heart disease Father     Social History   Socioeconomic History   Marital status: Married    Spouse name: Not on file   Number of children: Not on file   Years of education: Not on file   Highest education level: Not on file  Occupational History   Not on file  Tobacco Use   Smoking status: Never    Passive exposure: Never   Smokeless tobacco: Never  Vaping Use   Vaping Use: Never used  Substance and Sexual Activity   Alcohol use: No    Alcohol/week: 0.0 standard drinks of alcohol   Drug use: No   Sexual activity: Not Currently    Birth control/protection: Surgical  Other Topics Concern   Not on file  Social History Narrative   Not on file   Social Determinants of Health   Financial Resource Strain: Not on file  Food Insecurity: Not on file  Transportation Needs: Not on file  Physical Activity: Not on file  Stress: Not on file  Social Connections: Not on file   Review of systems General: negative for malaise, night sweats, fever, chills, weight loss Neck: Negative for lumps, goiter, pain and significant neck swelling Resp: Negative for cough, wheezing, dyspnea at rest CV: Negative for chest pain, leg swelling, palpitations, orthopnea GI: denies hematochezia, nausea, vomiting, diarrhea, constipation, dysphagia, odyonophagia, early satiety or unintentional weight loss. +epigastric pain +melena MSK: Negative for joint pain or swelling, back pain, and muscle pain. Derm: Negative for itching or rash Psych: Denies depression, anxiety, memory loss, confusion. No homicidal or suicidal ideation.  Heme: Negative for prolonged bleeding, bruising easily, and swollen nodes. Endocrine: Negative for cold or heat intolerance, polyuria, polydipsia and goiter. Neuro: negative for tremor, gait imbalance, syncope and seizures. The remainder of the review of systems is  noncontributory.  Physical Exam: BP (!) 171/84 (BP Location: Left Arm, Patient Position: Sitting, Cuff Size: Small)   Pulse 77   Temp 97.9 F (36.6 C) (Oral)   Ht '5\' 6"'$  (1.676 m)   Wt 159 lb 6.4 oz (72.3 kg)   BMI 25.73 kg/m  General:   Alert and oriented. No distress  noted. Pleasant and cooperative.  Head:  Normocephalic and atraumatic. Eyes:  Conjuctiva clear without scleral icterus. Mouth:  Oral mucosa pink and moist. Good dentition. No lesions. Heart: Normal rate and rhythm, s1 and s2 heart sounds present.  Lungs: Clear lung sounds in all lobes. Respirations equal and unlabored. Abdomen:  +BS, soft, non-tender and non-distended. No rebound or guarding. No HSM or masses noted. Derm: No palmar erythema or jaundice Msk:  Symmetrical without gross deformities. Normal posture. Extremities:  Without edema. Neurologic:  Alert and  oriented x4 Psych:  Alert and cooperative. Normal mood and affect.  Invalid input(s): "6 MONTHS"   ASSESSMENT: Damariz Paganelli is a 88 y.o. female presenting today for epigastric pain and melena   Epigastric pain occurring at night, improved some with carafate given to her by PCP, she endorses decreased appetite and early satiety. Having intermittent melena as well. Denies NSAID or etoh use. Recommend proceeding with EGD for further evaluation as we cannot rule out gastritis, duodenitis, AVMs, PUD. Should continue with carafate QID for now and her Daily PPI.   Indications, risks and benefits of procedure discussed in detail with patient. Patient verbalized understanding and is in agreement to proceed with EGD.     PLAN:  Schedule EGD-ASA III 2. Continue sucralafate QID 3. Hold miralax if having diarrhea 4. Continue PPI daily  All questions were answered, patient verbalized understanding and is in agreement with plan as outlined above.    Follow Up: 3 months   Traci Clark L. Alver Sorrow, MSN, APRN, AGNP-C Adult-Gerontology Nurse Practitioner [facility]

## 2022-02-05 ENCOUNTER — Encounter (INDEPENDENT_AMBULATORY_CARE_PROVIDER_SITE_OTHER): Payer: Self-pay

## 2022-03-20 NOTE — Patient Instructions (Signed)
Traci Clark  03/20/2022     '@PREFPERIOPPHARMACY'$ @   Your procedure is scheduled on  03/26/2022.   Report to [facility] at  1015  A.M.   Call this number if you have problems the morning of surgery:  [phone]  If you experience any cold or flu symptoms such as cough, fever, chills, shortness of breath, etc. between now and your scheduled surgery, please notify us at the above number.   Remember:  Follow the diet instructions given to you by the office.       Use your inhaler before you come and bring your rescue inhaler with you.       Take 7 units of your night time insulin the night before your procedure.      DO NOT take any medications for diabetes the morning of your procedure.     Take these medicines the morning of surgery with A SIP OF WATER         allopurinol, xanax(if needed), dexilant, zofran (if needed), tramadol(if needed).     Do not wear jewelry, make-up or nail polish.  Do not wear lotions, powders, or perfumes, or deodorant.  Do not shave 48 hours prior to surgery.  Men may shave face and neck.  Do not bring valuables to the hospital.  [facility] is not responsible for any belongings or valuables.  Contacts, dentures or bridgework may not be worn into surgery.  Leave your suitcase in the car.  After surgery it may be brought to your room.  For patients admitted to the hospital, discharge time will be determined by your treatment team.  Patients discharged the day of surgery will not be allowed to drive home and must have someone with them for 24 hours.    Special instructions:   DO NOT smoke tobacco or vape for 24 hours before your procedure.  Please read over the following fact sheets that you were given. Anesthesia Post-op Instructions and Care and Recovery After Surgery      Upper Endoscopy, Adult, Care After After the procedure, it is common to have a sore throat. It is also common to have: Mild stomach pain or  discomfort. Bloating. Nausea. Follow these instructions at home: The instructions below may help you care for yourself at home. Your health care provider may give you more instructions. If you have questions, ask your health care provider. If you were given a sedative during the procedure, it can affect you for several hours. Do not drive or operate machinery until your health care provider says that it is safe. If you will be going home right after the procedure, plan to have a responsible adult: Take you home from the hospital or clinic. You will not be allowed to drive. Care for you for the time you are told. Follow instructions from your health care provider about what you may eat and drink. Return to your normal activities as told by your health care provider. Ask your health care provider what activities are safe for you. Take over-the-counter and prescription medicines only as told by your health care provider. Contact a health care provider if you: Have a sore throat that lasts longer than one day. Have trouble swallowing. Have a fever. Get help right away if you: Vomit blood or your vomit looks like coffee grounds. Have bloody, black, or tarry stools. Have a very bad sore throat or you cannot swallow. Have difficulty breathing or very bad pain  in your chest or abdomen. These symptoms may be an emergency. Get help right away. Call 911. Do not wait to see if the symptoms will go away. Do not drive yourself to the hospital. Summary After the procedure, it is common to have a sore throat, mild stomach discomfort, bloating, and nausea. If you were given a sedative during the procedure, it can affect you for several hours. Do not drive until your health care provider says that it is safe. Follow instructions from your health care provider about what you may eat and drink. Return to your normal activities as told by your health care provider. This information is not intended to replace  advice given to you by your health care provider. Make sure you discuss any questions you have with your health care provider. Document Revised: 08/15/2021 Document Reviewed: 08/15/2021 Elsevier Patient Education  [place] After The following information offers guidance on how to care for yourself after your procedure. Your health care provider may also give you more specific instructions. If you have problems or questions, contact your health care provider. What can I expect after the procedure? After the procedure, it is common to have: Tiredness. Little or no memory about what happened during or after the procedure. Impaired judgment when it comes to making decisions. Nausea or vomiting. Some trouble with balance. Follow these instructions at home: For the time period you were told by your health care provider:  Rest. Do not participate in activities where you could fall or become injured. Do not drive or use machinery. Do not drink alcohol. Do not take sleeping pills or medicines that cause drowsiness. Do not make important decisions or sign legal documents. Do not take care of children on your own. Medicines Take over-the-counter and prescription medicines only as told by your health care provider. If you were prescribed antibiotics, take them as told by your health care provider. Do not stop using the antibiotic even if you start to feel better. Eating and drinking Follow instructions from your health care provider about what you may eat and drink. Drink enough fluid to keep your urine pale yellow. If you vomit: Drink clear fluids slowly and in small amounts as you are able. Clear fluids include water, ice chips, low-calorie sports drinks, and fruit juice that has water added to it (diluted fruit juice). Eat light and bland foods in small amounts as you are able. These foods include bananas, applesauce, rice, lean meats, toast, and  crackers. General instructions  Have a responsible adult stay with you for the time you are told. It is important to have someone help care for you until you are awake and alert. If you have sleep apnea, surgery and some medicines can increase your risk for breathing problems. Follow instructions from your health care provider about wearing your sleep device: When you are sleeping. This includes during daytime naps. While taking prescription pain medicines, sleeping medicines, or medicines that make you drowsy. Do not use any products that contain nicotine or tobacco. These products include cigarettes, chewing tobacco, and vaping devices, such as e-cigarettes. If you need help quitting, ask your health care provider. Contact a health care provider if: You feel nauseous or vomit every time you eat or drink. You feel light-headed. You are still sleepy or having trouble with balance after 24 hours. You get a rash. You have a fever. You have redness or swelling around the IV site. Get help right away if: You  have trouble breathing. You have new confusion after you get home. These symptoms may be an emergency. Get help right away. Call 911. Do not wait to see if the symptoms will go away. Do not drive yourself to the hospital. This information is not intended to replace advice given to you by your health care provider. Make sure you discuss any questions you have with your health care provider. Document Revised: 10/01/2021 Document Reviewed: 10/01/2021 Elsevier Patient Education  [place].

## 2022-03-21 ENCOUNTER — Encounter (HOSPITAL_COMMUNITY)
Admission: RE | Admit: 2022-03-21 | Discharge: 2022-03-21 | Disposition: A | Discharge status: Home / Self Care | Payer: Medicare HMO | Source: Ambulatory Visit | Attending: Gastroenterology | Admitting: Gastroenterology

## 2022-03-21 DIAGNOSIS — Z01812 Encounter for preprocedural laboratory examination: Secondary | ICD-10-CM | POA: Insufficient documentation

## 2022-03-21 DIAGNOSIS — I10 Essential (primary) hypertension: Secondary | ICD-10-CM | POA: Diagnosis not present

## 2022-03-21 LAB — BASIC METABOLIC PANEL
Anion gap: 11 (ref 5–15)
BUN: 10 mg/dL (ref 8–23)
CO2: 24 mmol/L (ref 22–32)
Calcium: 9.4 mg/dL (ref 8.9–10.3)
Chloride: 102 mmol/L (ref 98–111)
Creatinine, Ser: 1.19 mg/dL — ABNORMAL HIGH (ref 0.44–1.00)
GFR, Estimated: 44 mL/min — ABNORMAL LOW (ref >60)
Glucose, Bld: 148 mg/dL — ABNORMAL HIGH (ref 70–99)
Potassium: 4.1 mmol/L (ref 3.5–5.1)
Sodium: 137 mmol/L (ref 135–145)

## 2022-03-26 ENCOUNTER — Ambulatory Visit (HOSPITAL_COMMUNITY)
Admission: RE | Admit: 2022-03-26 | Discharge: 2022-03-26 | Disposition: A | Discharge status: Home / Self Care | Payer: Medicare HMO | Attending: Gastroenterology | Admitting: Gastroenterology

## 2022-03-26 ENCOUNTER — Ambulatory Visit (HOSPITAL_COMMUNITY): Payer: Medicare HMO | Admitting: Anesthesiology

## 2022-03-26 ENCOUNTER — Encounter (HOSPITAL_COMMUNITY): Payer: Self-pay | Admitting: Gastroenterology

## 2022-03-26 ENCOUNTER — Ambulatory Visit (HOSPITAL_BASED_OUTPATIENT_CLINIC_OR_DEPARTMENT_OTHER): Payer: Medicare HMO | Admitting: Anesthesiology

## 2022-03-26 ENCOUNTER — Encounter (HOSPITAL_COMMUNITY)
Admission: RE | Disposition: A | Discharge status: Home / Self Care | Payer: Self-pay | Source: Home / Self Care | Attending: Gastroenterology

## 2022-03-26 ENCOUNTER — Other Ambulatory Visit: Payer: Self-pay

## 2022-03-26 DIAGNOSIS — K219 Gastro-esophageal reflux disease without esophagitis: Secondary | ICD-10-CM | POA: Insufficient documentation

## 2022-03-26 DIAGNOSIS — I129 Hypertensive chronic kidney disease with stage 1 through stage 4 chronic kidney disease, or unspecified chronic kidney disease: Secondary | ICD-10-CM | POA: Diagnosis not present

## 2022-03-26 DIAGNOSIS — Z85038 Personal history of other malignant neoplasm of large intestine: Secondary | ICD-10-CM | POA: Insufficient documentation

## 2022-03-26 DIAGNOSIS — E119 Type 2 diabetes mellitus without complications: Secondary | ICD-10-CM

## 2022-03-26 DIAGNOSIS — K921 Melena: Secondary | ICD-10-CM

## 2022-03-26 DIAGNOSIS — E1122 Type 2 diabetes mellitus with diabetic chronic kidney disease: Secondary | ICD-10-CM | POA: Diagnosis not present

## 2022-03-26 DIAGNOSIS — M109 Gout, unspecified: Secondary | ICD-10-CM | POA: Insufficient documentation

## 2022-03-26 DIAGNOSIS — N183 Chronic kidney disease, stage 3 unspecified: Secondary | ICD-10-CM | POA: Diagnosis not present

## 2022-03-26 DIAGNOSIS — R0789 Other chest pain: Secondary | ICD-10-CM | POA: Diagnosis not present

## 2022-03-26 DIAGNOSIS — Z794 Long term (current) use of insulin: Secondary | ICD-10-CM | POA: Diagnosis not present

## 2022-03-26 DIAGNOSIS — K2289 Other specified disease of esophagus: Secondary | ICD-10-CM | POA: Insufficient documentation

## 2022-03-26 DIAGNOSIS — F419 Anxiety disorder, unspecified: Secondary | ICD-10-CM | POA: Diagnosis not present

## 2022-03-26 DIAGNOSIS — K317 Polyp of stomach and duodenum: Secondary | ICD-10-CM

## 2022-03-26 DIAGNOSIS — B3781 Candidal esophagitis: Secondary | ICD-10-CM | POA: Insufficient documentation

## 2022-03-26 DIAGNOSIS — K449 Diaphragmatic hernia without obstruction or gangrene: Secondary | ICD-10-CM | POA: Diagnosis not present

## 2022-03-26 DIAGNOSIS — I252 Old myocardial infarction: Secondary | ICD-10-CM | POA: Diagnosis not present

## 2022-03-26 DIAGNOSIS — R1013 Epigastric pain: Secondary | ICD-10-CM | POA: Diagnosis not present

## 2022-03-26 DIAGNOSIS — Z86718 Personal history of other venous thrombosis and embolism: Secondary | ICD-10-CM | POA: Insufficient documentation

## 2022-03-26 DIAGNOSIS — I10 Essential (primary) hypertension: Secondary | ICD-10-CM

## 2022-03-26 HISTORY — PX: ESOPHAGOGASTRODUODENOSCOPY (EGD) WITH PROPOFOL: SHX5813

## 2022-03-26 HISTORY — PX: BIOPSY: SHX5522

## 2022-03-26 LAB — GLUCOSE, CAPILLARY: Glucose-Capillary: 215 mg/dL — ABNORMAL HIGH (ref 70–99)

## 2022-03-26 SURGERY — ESOPHAGOGASTRODUODENOSCOPY (EGD) WITH PROPOFOL
Anesthesia: General

## 2022-03-26 MED ORDER — LACTATED RINGERS IV SOLN: INTRAVENOUS | Status: DC

## 2022-03-26 MED ORDER — ONDANSETRON HCL 4 MG/2ML IJ SOLN
4.0000 mg | Freq: Once | INTRAMUSCULAR | Status: AC
  Administered 2022-03-26: 4 mg via INTRAVENOUS

## 2022-03-26 MED ORDER — LIDOCAINE HCL 1 % IJ SOLN
INTRAMUSCULAR | Status: DC | PRN
  Administered 2022-03-26: 50 mg via INTRADERMAL

## 2022-03-26 MED ORDER — ONDANSETRON HCL 4 MG/2ML IJ SOLN
INTRAMUSCULAR | Status: AC
  Filled 2022-03-26: qty 2

## 2022-03-26 MED ORDER — PROPOFOL 10 MG/ML IV BOLUS
INTRAVENOUS | Status: DC | PRN
  Administered 2022-03-26: 50 mg via INTRAVENOUS
  Administered 2022-03-26: 40 mg via INTRAVENOUS
  Administered 2022-03-26: 50 mg via INTRAVENOUS

## 2022-03-26 NOTE — Anesthesia Postprocedure Evaluation (Signed)
Anesthesia Post Note  Patient: [place] Inthavong Cumber  Procedure(s) Performed: ESOPHAGOGASTRODUODENOSCOPY (EGD) WITH PROPOFOL BIOPSY  Patient location during evaluation: Short Stay Anesthesia Type: General Level of consciousness: awake and alert Pain management: pain level controlled Vital Signs Assessment: post-procedure vital signs reviewed and stable Respiratory status: spontaneous breathing Cardiovascular status: stable Postop Assessment: no apparent nausea or vomiting Anesthetic complications: no   No notable events documented.   Last Vitals:  Vitals:   03/26/22 1026 03/26/22 1211  BP: (!) 205/91 124/69  Pulse: 76 84  Resp: 19 (!) 22  Temp: 36.5 C 36.6 C  SpO2: 98% 97%    Last Pain:  Vitals:   03/26/22 1211  TempSrc:   PainSc: 0-No pain                 Remona Boom

## 2022-03-26 NOTE — Op Note (Signed)
[facility] Patient Name: Traci Clark Procedure Date: 03/26/2022 11:39 AM MRN: [id number] Date of Birth: February 07, 1931 Attending MD: [name] , , [id number] CSN: [id number] Age: 88 Admit Type: Outpatient Procedure:                Upper GI endoscopy Indications:              Epigastric abdominal pain, Chest pain (non cardiac) Providers:                [name], Lambert Mody, Everardo Pacific Referring MD:              Medicines:                Monitored Anesthesia Care Complications:            No immediate complications. Estimated Blood Loss:     Estimated blood loss: none. Procedure:                Pre-Anesthesia Assessment:                           - Prior to the procedure, a History and Physical                            was performed, and patient medications, allergies                            and sensitivities were reviewed. The patient's                            tolerance of previous anesthesia was reviewed.                           - The risks and benefits of the procedure and the                            sedation options and risks were discussed with the                            patient. All questions were answered and informed                            consent was obtained.                           - ASA Grade Assessment: II - A patient with mild                            systemic disease.                           After obtaining informed consent, the endoscope was                            passed under direct vision. Throughout the  procedure, the patient's blood pressure, pulse, and                            oxygen saturations were monitored continuously. The                            GIF-H190 ([id number]) scope was introduced through the                            mouth, and advanced to the second part of duodenum.                            The upper GI endoscopy was accomplished  with ease.                            The patient tolerated the procedure well. Scope In: 11:58:43 AM Scope Out: 12:04:53 PM Total Procedure Duration: 0 hours 6 minutes 10 seconds  Findings:      White nummular lesions were noted in the middle third of the esophagus.       Biopsies were taken with a cold forceps for histology.      A few small sessile polyps with no bleeding were found in the gastric       fundus. Biopsies were taken from the normal mucosa of antrum and body       with a cold forceps for Helicobacter pylori testing.      The examined duodenum was normal. Impression:               - White nummular lesions in esophageal mucosa.                            Biopsied.                           - A few gastric polyps. Biopsied normal mucosa for                            H. pylori testing.                           - Normal examined duodenum. Moderate Sedation:      Per Anesthesia Care Recommendation:           - Discharge patient to home (ambulatory).                           - Resume previous diet.                           - Await pathology results.                           - Continue present medications. Procedure Code(s):        --- Professional ---                           [phone], Esophagogastroduodenoscopy, flexible,  transoral; with biopsy, single or multiple Diagnosis Code(s):        --- Professional ---                           K22.89, Other specified disease of esophagus                           K31.7, Polyp of stomach and duodenum                           R10.13, Epigastric pain                           R07.89, Other chest pain CPT copyright 2022 American Medical Association. All rights reserved. The codes documented in this report are preliminary and upon coder review may  be revised to meet current compliance requirements. [name], MD [name],  03/26/2022 12:12:16 PM This report has been signed  electronically. Number of Addenda: 0

## 2022-03-26 NOTE — Discharge Instructions (Addendum)
You are being discharged to home.  ?Resume your previous diet.  ?We are waiting for your pathology results.  ?Continue your present medications.  ?

## 2022-03-26 NOTE — Transfer of Care (Addendum)
Immediate Anesthesia Transfer of Care Note  Patient: Traci Clark  Procedure(s) Performed: ESOPHAGOGASTRODUODENOSCOPY (EGD) WITH PROPOFOL BIOPSY  Patient Location: Short Stay  Anesthesia Type:General  Level of Consciousness: awake  Airway & Oxygen Therapy: Patient Spontanous Breathing  Post-op Assessment: Report given to RN  Post vital signs: Reviewed  Last Vitals:  Vitals Value Taken Time  BP 124/69   Temp 36.6   Pulse 84   Resp 22   SpO2 97     Last Pain:  Vitals:   03/26/22 1152  TempSrc:   PainSc: 5       Patients Stated Pain Goal: 6 (37/00/52 5910)  Complications: No notable events documented.

## 2022-03-26 NOTE — H&P (Signed)
Traci Clark is an 88 y.o. female.   Chief Complaint: chest and abdominal pain HPI:  88 y.o. female with past medical history of colon cancer s/p resection, diabetes, DVT, GERD,  gout, myocardial infarction, anxiety, who comes for evaluation of chest and abdominal pain.  States that for the last 2 months she has presented recurrent episodes of pain in her lower chest which radiates to her rest of the abdomen.  She has been taking Carafate with very mild relief.  Denies any nausea, vomiting, fever, chills.  Has presented some occasional possible melena.  Past Medical History:  Diagnosis Date   Anxiety    Arthritis    Chronic constipation    CKD (chronic kidney disease), stage III (HCC)    Colon cancer (HCC)    colon   Complication of anesthesia    Diabetes mellitus without complication ([place])    DVT (deep vein thrombosis) in pregnancy    Dysphagia    GERD (gastroesophageal reflux disease)    Glaucoma    Gout    H/O hiatal hernia    History of kidney stones    Iliac artery aneurysm, bilateral (HCC)    MI (myocardial infarction) ([place]) 1993   PONV (postoperative nausea and vomiting)    Restless leg syndrome    Seizure ([place])    withdrawal from xanax   Vertigo     Past Surgical History:  Procedure Laterality Date   BIOPSY  07/21/2020   Procedure: BIOPSY;  Surgeon: Harvel Quale, MD;  [facility];  Service: Gastroenterology;;   CHOLECYSTECTOMY     COLECTOMY  2003   COLONOSCOPY N/A 07/29/2012   Procedure: COLONOSCOPY;  Surgeon: Rogene Houston, MD;  [facility];  Service: Endoscopy;  Laterality: N/A;  100   COLONOSCOPY WITH PROPOFOL N/A 12/13/2016   Procedure: COLONOSCOPY WITH PROPOFOL;  Surgeon: Rogene Houston, MD;  [facility];  Service: Endoscopy;  Laterality: N/A;  8:30   COLONOSCOPY WITH PROPOFOL N/A 07/21/2020   Procedure: COLONOSCOPY WITH PROPOFOL;  Surgeon: Harvel Quale, MD;  [facility];  Service:  Gastroenterology;  Laterality: N/A;  AM   ESOPHAGEAL DILATION  11/03/2015   Procedure: ESOPHAGEAL DILATION;  Surgeon: Rogene Houston, MD;  [facility];  Service: Endoscopy;;   ESOPHAGEAL DILATION N/A 09/26/2017   Procedure: ESOPHAGEAL DILATION;  Surgeon: Rogene Houston, MD;  [facility];  Service: Endoscopy;  Laterality: N/A;   ESOPHAGOGASTRODUODENOSCOPY N/A 03/25/2014   Procedure: ESOPHAGOGASTRODUODENOSCOPY (EGD);  Surgeon: Rogene Houston, MD;  [facility];  Service: Endoscopy;  Laterality: N/A;  1055   ESOPHAGOGASTRODUODENOSCOPY N/A 11/03/2015   Procedure: ESOPHAGOGASTRODUODENOSCOPY (EGD);  Surgeon: Rogene Houston, MD;  [facility];  Service: Endoscopy;  Laterality: N/A;  1:55 - moved to 11:15 - Ann to notify - moved to 12:45 - pt knows to arrive at 11:45   ESOPHAGOGASTRODUODENOSCOPY N/A 09/26/2017   Procedure: ESOPHAGOGASTRODUODENOSCOPY (EGD);  Surgeon: Rogene Houston, MD;  [facility];  Service: Endoscopy;  Laterality: N/A;   ESOPHAGOGASTRODUODENOSCOPY (EGD) WITH PROPOFOL N/A 07/21/2020   Procedure: ESOPHAGOGASTRODUODENOSCOPY (EGD) WITH PROPOFOL;  Surgeon: Harvel Quale, MD;  [facility];  Service: Gastroenterology;  Laterality: N/A;   MALONEY DILATION N/A 03/25/2014   Procedure: Venia Minks DILATION;  Surgeon: Rogene Houston, MD;  [facility];  Service: Endoscopy;  Laterality: N/A;   POLYPECTOMY  12/13/2016   Procedure: POLYPECTOMY;  Surgeon: Laural Golden,  Mechele Dawley, MD;  [facility];  Service: Endoscopy;;  polyp   POLYPECTOMY  07/21/2020   Procedure: POLYPECTOMY;  Surgeon: Harvel Quale, MD;  [facility];  Service: Gastroenterology;;   TOTAL HIP ARTHROPLASTY Left    TUBAL LIGATION     VEIN SURGERY Bilateral    stripped vein due to DVT    Family History  Problem Relation Age of Onset   Heart disease Father    Social History:  reports that she has never smoked. She  has never been exposed to tobacco smoke. She has never used smokeless tobacco. She reports that she does not drink alcohol and does not use drugs.  Allergies:  Allergies  Allergen Reactions   Amoxil [Amoxicillin] Anaphylaxis and Rash    "Skin peeled off"   Macrobid [Nitrofurantoin Monohyd Macro] Anaphylaxis, Rash and Other (See Comments)    "Skin peeled off"   Pravachol [Pravastatin] Other (See Comments)    MYALGIAS  MYALGIAS  MYALGIAS  MYALGIAS   Codeine Nausea And Vomiting and Other (See Comments)    shaking   Darvon [Propoxyphene] Nausea And Vomiting and Other (See Comments)    shaking   Mirtazapine Other (See Comments)    Does not tolerate   Neurontin [Gabapentin] Nausea Only    Feel faint   Phenergan [Promethazine] Other (See Comments)    altered mental changes   Rocephin [Ceftriaxone] Other (See Comments)    Yeast infection   Hydrocodone-Acetaminophen Nausea And Vomiting and Other (See Comments)    Causes chest to feel heavy      Medications Prior to Admission  Medication Sig Dispense Refill   acetaminophen (TYLENOL) 500 MG tablet Take 1,000 mg by mouth every 6 (six) hours as needed (pain.). GELCAPS     albuterol (VENTOLIN HFA) 108 (90 Base) MCG/ACT inhaler Inhale 1-2 puffs into the lungs every 6 (six) hours as needed for shortness of breath or wheezing. prn     allopurinol (ZYLOPRIM) 100 MG tablet Take 100 mg by mouth in the morning.     ALPRAZolam (XANAX) 0.5 MG tablet Take 1 tablet (0.5 mg total) by mouth 3 (three) times daily as needed. (Patient taking differently: Take 0.5 mg by mouth in the morning, at noon, and at bedtime.)     dexlansoprazole (DEXILANT) 60 MG capsule Take 60 mg by mouth daily before breakfast.     fluticasone (FLONASE) 50 MCG/ACT nasal spray Place 2 sprays into both nostrils daily as needed (allergies (cough/sneezing)).     furosemide (LASIX) 20 MG tablet Take 20 mg by mouth in the morning.     insulin aspart (NOVOLOG FLEXPEN RELION) 100 UNIT/ML  FlexPen Inject 4-10 Units into the skin 3 (three) times daily with meals. Per sliding scale     Insulin Glargine (BASAGLAR KWIKPEN [place]) Inject 14 Units into the skin at bedtime.     Iron-Vitamins (GERITOL COMPLETE) TABS Take 1 tablet by mouth in the morning.     latanoprost (XALATAN) 0.005 % ophthalmic solution Place 1 drop into both eyes at bedtime.     LUTEIN PO Take 40 mg by mouth daily at 6 (six) AM.     meclizine (ANTIVERT) 25 MG tablet Take 1 tablet (25 mg total) by mouth 3 (three) times daily as needed for dizziness. 30 tablet 0   ondansetron (ZOFRAN) 4 MG tablet Take 4 mg by mouth every 6 (six) hours as needed for nausea or vomiting.     polyethylene glycol (MIRALAX / GLYCOLAX) packet Take  34 g by mouth in the morning.     potassium chloride (MICRO-K) 10 MEQ CR capsule Take 10 mEq by mouth in the morning.     sucralfate (CARAFATE) 1 GM/10ML suspension Take 1 g by mouth in the morning, at noon, and at bedtime.     traMADol (ULTRAM) 50 MG tablet Take 100 mg by mouth 2 (two) times daily.     triamcinolone cream (KENALOG) 0.1 % Apply 1 Application topically 2 (two) times daily.      Results for orders placed or performed during the hospital encounter of 03/26/22 (from the past 48 hour(s))  Glucose, capillary     Status: Abnormal   Collection Time: 03/26/22 10:17 AM  Result Value Ref Range   Glucose-Capillary 215 (H) 70 - 99 mg/dL    Comment: Glucose reference range applies only to samples taken after fasting for at least 8 hours.   No results found.  Review of Systems  Constitutional: Negative.   HENT: Negative.    Eyes: Negative.   Respiratory: Negative.    Cardiovascular:  Positive for chest pain.  Gastrointestinal:  Positive for abdominal pain.  Endocrine: Negative.   Genitourinary: Negative.   Musculoskeletal: Negative.   Allergic/Immunologic: Negative.   Neurological: Negative.   Hematological: Negative.   Psychiatric/Behavioral: Negative.      Blood pressure (!)  205/91, pulse 76, temperature 97.7 F (36.5 C), temperature source Oral, resp. rate 19, SpO2 98 %. Physical Exam  GENERAL: The patient is AO x3, in no acute distress. HEENT: Head is normocephalic and atraumatic. EOMI are intact. Mouth is well hydrated and without lesions. NECK: Supple. No masses LUNGS: Clear to auscultation. No presence of rhonchi/wheezing/rales. Adequate chest expansion HEART: RRR, normal s1 and s2. ABDOMEN: Soft, nontender, no guarding, no peritoneal signs, and nondistended. BS +. No masses. EXTREMITIES: Without any cyanosis, clubbing, rash, lesions or edema. NEUROLOGIC: AOx3, no focal motor deficit. SKIN: no jaundice, no rashes  Assessment/Plan  88 y.o. female with past medical history of colon cancer s/p resection, diabetes, DVT, GERD,  gout, myocardial infarction, anxiety, who comes for evaluation of chest and abdominal pain.  We will proceed with EGD.  Harvel Quale, MD 03/26/2022, 11:15 AM

## 2022-03-26 NOTE — Anesthesia Preprocedure Evaluation (Addendum)
Anesthesia Evaluation  Patient identified by MRN, date of birth, ID band Patient awake    Reviewed: Allergy & Precautions, H&P , NPO status , Patient's Chart, lab work & pertinent test results  History of Anesthesia Complications (+) PONV and history of anesthetic complications  Airway Mallampati: II  TM Distance: >3 FB Neck ROM: Full    Dental  (+) Edentulous Upper, Edentulous Lower   Pulmonary neg pulmonary ROS   Pulmonary exam normal breath sounds clear to auscultation       Cardiovascular Exercise Tolerance: Good hypertension, Pt. on medications + Past MI   Rhythm:Irregular Rate:Normal + Systolic murmurs    Neuro/Psych Seizures -, Well Controlled,  PSYCHIATRIC DISORDERS Anxiety        GI/Hepatic Neg liver ROS, hiatal hernia,GERD  Medicated and Poorly Controlled,,  Endo/Other  diabetes, Well Controlled, Type 2, Insulin Dependent    Renal/GU Renal InsufficiencyRenal disease  negative genitourinary   Musculoskeletal  (+) Arthritis , Osteoarthritis,    Abdominal   Peds negative pediatric ROS (+)  Hematology negative hematology ROS (+)   Anesthesia Other Findings   Reproductive/Obstetrics negative OB ROS                              Anesthesia Physical Anesthesia Plan  ASA: 3  Anesthesia Plan: General   Post-op Pain Management: Minimal or no pain anticipated   Induction: Intravenous  PONV Risk Score and Plan: TIVA  Airway Management Planned:   Additional Equipment:   Intra-op Plan:   Post-operative Plan:   Informed Consent: I have reviewed the patients History and Physical, chart, labs and discussed the procedure including the risks, benefits and alternatives for the proposed anesthesia with the patient or authorized representative who has indicated his/her understanding and acceptance.       Plan Discussed with: CRNA and Surgeon  Anesthesia Plan Comments:           Anesthesia Quick Evaluation

## 2022-03-27 ENCOUNTER — Other Ambulatory Visit (INDEPENDENT_AMBULATORY_CARE_PROVIDER_SITE_OTHER): Payer: Self-pay | Admitting: Gastroenterology

## 2022-03-27 DIAGNOSIS — B3781 Candidal esophagitis: Secondary | ICD-10-CM

## 2022-03-27 LAB — SURGICAL PATHOLOGY

## 2022-03-27 MED ORDER — FLUCONAZOLE 150 MG PO TABS: 150.0000 mg | ORAL_TABLET | Freq: Every day | ORAL | 0 refills | Status: DC

## 2022-03-29 ENCOUNTER — Other Ambulatory Visit (INDEPENDENT_AMBULATORY_CARE_PROVIDER_SITE_OTHER): Payer: Self-pay | Admitting: Gastroenterology

## 2022-03-29 DIAGNOSIS — B3781 Candidal esophagitis: Secondary | ICD-10-CM

## 2022-04-01 ENCOUNTER — Other Ambulatory Visit (INDEPENDENT_AMBULATORY_CARE_PROVIDER_SITE_OTHER): Payer: Self-pay

## 2022-04-01 DIAGNOSIS — B3781 Candidal esophagitis: Secondary | ICD-10-CM

## 2022-04-01 MED ORDER — FLUCONAZOLE 150 MG PO TABS: 150.0000 mg | ORAL_TABLET | Freq: Every day | ORAL | 0 refills | Status: AC

## 2022-04-02 ENCOUNTER — Encounter (HOSPITAL_COMMUNITY): Payer: Self-pay | Admitting: Gastroenterology

## 2022-04-13 ENCOUNTER — Emergency Department (HOSPITAL_COMMUNITY): Payer: Medicare HMO

## 2022-04-13 ENCOUNTER — Inpatient Hospital Stay (HOSPITAL_COMMUNITY)
Admission: EM | Admit: 2022-04-13 | Discharge: 2022-04-17 | DRG: 682 | Disposition: A | Discharge status: Skilled Nursing Facility | Payer: Medicare HMO | Attending: Internal Medicine | Admitting: Internal Medicine

## 2022-04-13 ENCOUNTER — Other Ambulatory Visit: Payer: Self-pay

## 2022-04-13 ENCOUNTER — Encounter (HOSPITAL_COMMUNITY): Payer: Self-pay | Admitting: Emergency Medicine

## 2022-04-13 DIAGNOSIS — N189 Chronic kidney disease, unspecified: Secondary | ICD-10-CM | POA: Diagnosis not present

## 2022-04-13 DIAGNOSIS — N179 Acute kidney failure, unspecified: Secondary | ICD-10-CM | POA: Diagnosis not present

## 2022-04-13 DIAGNOSIS — R404 Transient alteration of awareness: Secondary | ICD-10-CM

## 2022-04-13 DIAGNOSIS — Z8249 Family history of ischemic heart disease and other diseases of the circulatory system: Secondary | ICD-10-CM

## 2022-04-13 DIAGNOSIS — M25461 Effusion, right knee: Secondary | ICD-10-CM | POA: Diagnosis present

## 2022-04-13 DIAGNOSIS — Z1152 Encounter for screening for COVID-19: Secondary | ICD-10-CM

## 2022-04-13 DIAGNOSIS — I10 Essential (primary) hypertension: Secondary | ICD-10-CM | POA: Diagnosis not present

## 2022-04-13 DIAGNOSIS — G9341 Metabolic encephalopathy: Secondary | ICD-10-CM | POA: Diagnosis present

## 2022-04-13 DIAGNOSIS — Z885 Allergy status to narcotic agent status: Secondary | ICD-10-CM

## 2022-04-13 DIAGNOSIS — I129 Hypertensive chronic kidney disease with stage 1 through stage 4 chronic kidney disease, or unspecified chronic kidney disease: Secondary | ICD-10-CM | POA: Diagnosis present

## 2022-04-13 DIAGNOSIS — Z23 Encounter for immunization: Secondary | ICD-10-CM

## 2022-04-13 DIAGNOSIS — K5900 Constipation, unspecified: Secondary | ICD-10-CM | POA: Diagnosis present

## 2022-04-13 DIAGNOSIS — N1832 Chronic kidney disease, stage 3b: Secondary | ICD-10-CM | POA: Diagnosis present

## 2022-04-13 DIAGNOSIS — K219 Gastro-esophageal reflux disease without esophagitis: Secondary | ICD-10-CM | POA: Diagnosis present

## 2022-04-13 DIAGNOSIS — W19XXXA Unspecified fall, initial encounter: Secondary | ICD-10-CM | POA: Diagnosis present

## 2022-04-13 DIAGNOSIS — Z9049 Acquired absence of other specified parts of digestive tract: Secondary | ICD-10-CM

## 2022-04-13 DIAGNOSIS — Z66 Do not resuscitate: Secondary | ICD-10-CM | POA: Diagnosis present

## 2022-04-13 DIAGNOSIS — Z8673 Personal history of transient ischemic attack (TIA), and cerebral infarction without residual deficits: Secondary | ICD-10-CM

## 2022-04-13 DIAGNOSIS — Z794 Long term (current) use of insulin: Secondary | ICD-10-CM

## 2022-04-13 DIAGNOSIS — R1310 Dysphagia, unspecified: Secondary | ICD-10-CM | POA: Diagnosis present

## 2022-04-13 DIAGNOSIS — G2581 Restless legs syndrome: Secondary | ICD-10-CM | POA: Diagnosis present

## 2022-04-13 DIAGNOSIS — Z85038 Personal history of other malignant neoplasm of large intestine: Secondary | ICD-10-CM

## 2022-04-13 DIAGNOSIS — Z87442 Personal history of urinary calculi: Secondary | ICD-10-CM

## 2022-04-13 DIAGNOSIS — E1122 Type 2 diabetes mellitus with diabetic chronic kidney disease: Secondary | ICD-10-CM | POA: Diagnosis present

## 2022-04-13 DIAGNOSIS — Z96642 Presence of left artificial hip joint: Secondary | ICD-10-CM | POA: Diagnosis present

## 2022-04-13 DIAGNOSIS — E872 Acidosis, unspecified: Secondary | ICD-10-CM | POA: Diagnosis present

## 2022-04-13 DIAGNOSIS — I252 Old myocardial infarction: Secondary | ICD-10-CM

## 2022-04-13 DIAGNOSIS — R627 Adult failure to thrive: Secondary | ICD-10-CM | POA: Diagnosis present

## 2022-04-13 DIAGNOSIS — I2510 Atherosclerotic heart disease of native coronary artery without angina pectoris: Secondary | ICD-10-CM | POA: Diagnosis present

## 2022-04-13 DIAGNOSIS — Z888 Allergy status to other drugs, medicaments and biological substances status: Secondary | ICD-10-CM

## 2022-04-13 DIAGNOSIS — Z6825 Body mass index (BMI) 25.0-25.9, adult: Secondary | ICD-10-CM

## 2022-04-13 DIAGNOSIS — E1165 Type 2 diabetes mellitus with hyperglycemia: Secondary | ICD-10-CM | POA: Diagnosis present

## 2022-04-13 DIAGNOSIS — M109 Gout, unspecified: Secondary | ICD-10-CM | POA: Diagnosis present

## 2022-04-13 DIAGNOSIS — E119 Type 2 diabetes mellitus without complications: Secondary | ICD-10-CM

## 2022-04-13 DIAGNOSIS — E860 Dehydration: Secondary | ICD-10-CM | POA: Diagnosis present

## 2022-04-13 DIAGNOSIS — B3781 Candidal esophagitis: Secondary | ICD-10-CM | POA: Diagnosis present

## 2022-04-13 DIAGNOSIS — R4182 Altered mental status, unspecified: Secondary | ICD-10-CM | POA: Diagnosis present

## 2022-04-13 LAB — MAGNESIUM: Magnesium: 1.8 mg/dL (ref 1.7–2.4)

## 2022-04-13 LAB — PROTIME-INR
INR: 1.2 (ref 0.8–1.2)
Prothrombin Time: 15.1 seconds (ref 11.4–15.2)

## 2022-04-13 LAB — URINALYSIS, ROUTINE W REFLEX MICROSCOPIC
Bilirubin Urine: NEGATIVE
Glucose, UA: NEGATIVE mg/dL
Hgb urine dipstick: NEGATIVE
Ketones, ur: NEGATIVE mg/dL
Leukocytes,Ua: NEGATIVE
Nitrite: NEGATIVE
Protein, ur: NEGATIVE mg/dL
Specific Gravity, Urine: 1.013 (ref 1.005–1.030)
pH: 5 (ref 5.0–8.0)

## 2022-04-13 LAB — CBC WITH DIFFERENTIAL/PLATELET
Abs Immature Granulocytes: 0.04 10*3/uL (ref 0.00–0.07)
Basophils Absolute: 0.1 10*3/uL (ref 0.0–0.1)
Basophils Relative: 1 %
Eosinophils Absolute: 0 10*3/uL (ref 0.0–0.5)
Eosinophils Relative: 0 %
HCT: 40.1 % (ref 36.0–46.0)
Hemoglobin: 13.4 g/dL (ref 12.0–15.0)
Immature Granulocytes: 0 %
Lymphocytes Relative: 17 %
Lymphs Abs: 1.7 10*3/uL (ref 0.7–4.0)
MCH: 29.8 pg (ref 26.0–34.0)
MCHC: 33.4 g/dL (ref 30.0–36.0)
MCV: 89.1 fL (ref 80.0–100.0)
Monocytes Absolute: 1 10*3/uL (ref 0.1–1.0)
Monocytes Relative: 9 %
Neutro Abs: 7.7 10*3/uL (ref 1.7–7.7)
Neutrophils Relative %: 73 %
Platelets: 178 10*3/uL (ref 150–400)
RBC: 4.5 MIL/uL (ref 3.87–5.11)
RDW: 12.7 % (ref 11.5–15.5)
WBC: 10.5 10*3/uL (ref 4.0–10.5)
nRBC: 0 % (ref 0.0–0.2)

## 2022-04-13 LAB — SYNOVIAL CELL COUNT + DIFF, W/ CRYSTALS
Crystals, Fluid: NONE SEEN
Eosinophils-Synovial: 0 % (ref 0–1)
Lymphocytes-Synovial Fld: 8 % (ref 0–20)
Monocyte-Macrophage-Synovial Fluid: 15 % — ABNORMAL LOW (ref 50–90)
Neutrophil, Synovial: 77 % — ABNORMAL HIGH (ref 0–25)
WBC, Synovial: 7875 /mm3 — ABNORMAL HIGH (ref 0–200)

## 2022-04-13 LAB — COMPREHENSIVE METABOLIC PANEL
ALT: 15 U/L (ref 0–44)
AST: 30 U/L (ref 15–41)
Albumin: 3.6 g/dL (ref 3.5–5.0)
Alkaline Phosphatase: 94 U/L (ref 38–126)
Anion gap: 10 (ref 5–15)
BUN: 30 mg/dL — ABNORMAL HIGH (ref 8–23)
CO2: 29 mmol/L (ref 22–32)
Calcium: 9.6 mg/dL (ref 8.9–10.3)
Chloride: 95 mmol/L — ABNORMAL LOW (ref 98–111)
Creatinine, Ser: 2.05 mg/dL — ABNORMAL HIGH (ref 0.44–1.00)
GFR, Estimated: 23 mL/min — ABNORMAL LOW (ref >60)
Glucose, Bld: 304 mg/dL — ABNORMAL HIGH (ref 70–99)
Potassium: 4.8 mmol/L (ref 3.5–5.1)
Sodium: 134 mmol/L — ABNORMAL LOW (ref 135–145)
Total Bilirubin: 2.1 mg/dL — ABNORMAL HIGH (ref 0.3–1.2)
Total Protein: 8.1 g/dL (ref 6.5–8.1)

## 2022-04-13 LAB — I-STAT CHEM 8, ED
BUN: 29 mg/dL — ABNORMAL HIGH (ref 8–23)
Calcium, Ion: 1.17 mmol/L (ref 1.15–1.40)
Chloride: 96 mmol/L — ABNORMAL LOW (ref 98–111)
Creatinine, Ser: 2.1 mg/dL — ABNORMAL HIGH (ref 0.44–1.00)
Glucose, Bld: 311 mg/dL — ABNORMAL HIGH (ref 70–99)
HCT: 43 % (ref 36.0–46.0)
Hemoglobin: 14.6 g/dL (ref 12.0–15.0)
Potassium: 4.8 mmol/L (ref 3.5–5.1)
Sodium: 134 mmol/L — ABNORMAL LOW (ref 135–145)
TCO2: 30 mmol/L (ref 22–32)

## 2022-04-13 LAB — RESP PANEL BY RT-PCR (FLU A&B, COVID) ARPGX2
Influenza A by PCR: NEGATIVE
Influenza B by PCR: NEGATIVE
SARS Coronavirus 2 by RT PCR: NEGATIVE

## 2022-04-13 LAB — LACTIC ACID, PLASMA
Lactic Acid, Venous: 2.3 mmol/L (ref 0.5–1.9)
Lactic Acid, Venous: 1.9 mmol/L (ref 0.5–1.9)

## 2022-04-13 LAB — TROPONIN I (HIGH SENSITIVITY)
Troponin I (High Sensitivity): 5 ng/L (ref <18)
Troponin I (High Sensitivity): 5 ng/L (ref <18)

## 2022-04-13 LAB — C-REACTIVE PROTEIN: CRP: 23.1 mg/dL — ABNORMAL HIGH (ref <1.0)

## 2022-04-13 LAB — SEDIMENTATION RATE: Sed Rate: 61 mm/hr — ABNORMAL HIGH (ref 0–22)

## 2022-04-13 LAB — CBG MONITORING, ED: Glucose-Capillary: 207 mg/dL — ABNORMAL HIGH (ref 70–99)

## 2022-04-13 LAB — GLUCOSE, CAPILLARY: Glucose-Capillary: 243 mg/dL — ABNORMAL HIGH (ref 70–99)

## 2022-04-13 MED ORDER — ACETAMINOPHEN 650 MG RE SUPP: 650.0000 mg | Freq: Four times a day (QID) | RECTAL | Status: DC | PRN

## 2022-04-13 MED ORDER — LIDOCAINE-EPINEPHRINE (PF) 2 %-1:200000 IJ SOLN
10.0000 mL | Freq: Once | INTRAMUSCULAR | Status: AC
  Administered 2022-04-13: 10 mL via INTRADERMAL
  Filled 2022-04-13: qty 20

## 2022-04-13 MED ORDER — INSULIN ASPART 100 UNIT/ML IJ SOLN
0.0000 [IU] | Freq: Three times a day (TID) | INTRAMUSCULAR | Status: DC
  Administered 2022-04-14: 2 [IU] via SUBCUTANEOUS
  Administered 2022-04-14 – 2022-04-15 (×3): 3 [IU] via SUBCUTANEOUS
  Administered 2022-04-15 (×2): 1 [IU] via SUBCUTANEOUS
  Administered 2022-04-16: 3 [IU] via SUBCUTANEOUS
  Administered 2022-04-16: 9 [IU] via SUBCUTANEOUS
  Administered 2022-04-16: 5 [IU] via SUBCUTANEOUS

## 2022-04-13 MED ORDER — PANTOPRAZOLE SODIUM 40 MG PO TBEC
40.0000 mg | DELAYED_RELEASE_TABLET | Freq: Every day | ORAL | Status: DC
  Administered 2022-04-13 – 2022-04-17 (×5): 40 mg via ORAL
  Filled 2022-04-13 (×5): qty 1

## 2022-04-13 MED ORDER — ALPRAZOLAM 0.25 MG PO TABS
0.2500 mg | ORAL_TABLET | Freq: Two times a day (BID) | ORAL | Status: DC | PRN
  Administered 2022-04-16: 0.25 mg via ORAL
  Filled 2022-04-13: qty 1

## 2022-04-13 MED ORDER — HEPARIN SODIUM (PORCINE) 5000 UNIT/ML IJ SOLN
5000.0000 [IU] | Freq: Three times a day (TID) | INTRAMUSCULAR | Status: DC
  Administered 2022-04-13 – 2022-04-17 (×10): 5000 [IU] via SUBCUTANEOUS
  Filled 2022-04-13 (×9): qty 1

## 2022-04-13 MED ORDER — INSULIN ASPART 100 UNIT/ML IJ SOLN
0.0000 [IU] | Freq: Every day | INTRAMUSCULAR | Status: DC
  Administered 2022-04-13: 2 [IU] via SUBCUTANEOUS
  Administered 2022-04-15 – 2022-04-16 (×2): 3 [IU] via SUBCUTANEOUS

## 2022-04-13 MED ORDER — LATANOPROST 0.005 % OP SOLN
1.0000 [drp] | Freq: Every day | OPHTHALMIC | Status: DC
  Administered 2022-04-13 – 2022-04-16 (×4): 1 [drp] via OPHTHALMIC
  Filled 2022-04-13 (×2): qty 2.5

## 2022-04-13 MED ORDER — ASPIRIN 81 MG PO TBEC
81.0000 mg | DELAYED_RELEASE_TABLET | Freq: Every day | ORAL | Status: DC
  Administered 2022-04-13 – 2022-04-17 (×5): 81 mg via ORAL
  Filled 2022-04-13 (×5): qty 1

## 2022-04-13 MED ORDER — TRAMADOL HCL 50 MG PO TABS
50.0000 mg | ORAL_TABLET | Freq: Two times a day (BID) | ORAL | Status: DC | PRN
  Administered 2022-04-14 – 2022-04-15 (×2): 50 mg via ORAL
  Filled 2022-04-13 (×2): qty 1

## 2022-04-13 MED ORDER — ACETAMINOPHEN 325 MG PO TABS
650.0000 mg | ORAL_TABLET | Freq: Four times a day (QID) | ORAL | Status: DC | PRN
  Administered 2022-04-14 – 2022-04-16 (×2): 650 mg via ORAL
  Filled 2022-04-13 (×2): qty 2

## 2022-04-13 MED ORDER — SODIUM CHLORIDE 0.9 % IV SOLN: INTRAVENOUS | Status: DC

## 2022-04-13 MED ORDER — LACTATED RINGERS IV BOLUS (SEPSIS)
1000.0000 mL | Freq: Once | INTRAVENOUS | Status: AC
  Administered 2022-04-13: 1000 mL via INTRAVENOUS

## 2022-04-13 MED ORDER — FLUCONAZOLE 150 MG PO TABS
150.0000 mg | ORAL_TABLET | Freq: Every day | ORAL | Status: DC
  Administered 2022-04-13 – 2022-04-17 (×5): 150 mg via ORAL
  Filled 2022-04-13 (×5): qty 1

## 2022-04-13 MED ORDER — ALLOPURINOL 100 MG PO TABS
100.0000 mg | ORAL_TABLET | Freq: Every morning | ORAL | Status: DC
  Administered 2022-04-14 – 2022-04-17 (×4): 100 mg via ORAL
  Filled 2022-04-13 (×5): qty 1

## 2022-04-13 MED ORDER — BASAGLAR KWIKPEN 100 UNIT/ML ~~LOC~~ SOPN
14.0000 [IU] | PEN_INJECTOR | Freq: Every day | SUBCUTANEOUS | Status: DC
  Filled 2022-04-13: qty 3

## 2022-04-13 MED ORDER — FENTANYL CITRATE PF 50 MCG/ML IJ SOSY
25.0000 ug | PREFILLED_SYRINGE | Freq: Once | INTRAMUSCULAR | Status: AC
  Administered 2022-04-13: 25 ug via INTRAVENOUS
  Filled 2022-04-13: qty 1

## 2022-04-13 MED ORDER — INFLUENZA VAC A&B SA ADJ QUAD 0.5 ML IM PRSY
0.5000 mL | PREFILLED_SYRINGE | INTRAMUSCULAR | Status: AC
  Administered 2022-04-15: 0.5 mL via INTRAMUSCULAR
  Filled 2022-04-13: qty 0.5

## 2022-04-13 MED ORDER — INSULIN DETEMIR 100 UNIT/ML ~~LOC~~ SOLN
14.0000 [IU] | Freq: Every day | SUBCUTANEOUS | Status: DC
  Administered 2022-04-15: 14 [IU] via SUBCUTANEOUS
  Filled 2022-04-13 (×4): qty 0.14

## 2022-04-13 NOTE — ED Notes (Signed)
Patient transported to CT 

## 2022-04-13 NOTE — ED Provider Notes (Signed)
[facility] EMERGENCY DEPARTMENT Provider Note   CSN: [id number] Arrival date & time: 04/13/22  1048     History {Add pertinent medical, surgical, social history, OB history to HPI:1} Chief Complaint  Patient presents with   Altered Mental Status    Traci Clark is a 88 y.o. female.   Altered Mental Status Associated symptoms: weakness (Generalized)   Patient presents for fatigue, nausea, confusion.  Medical history includes CAD, anxiety, arthritis, chronic constipation, CKD, DVT, DM, GERD, gout, seizures, RLS, colon cancer.  Symptoms have been present for the past 3 days.  4 days ago, she had a fall from bed.  She is not on blood thinners.  After the fall, she was able to get up.  She has been able to bear weight but does have generalized weakness which has limited her mobility over the past 3 days.  She has had recent dysuria.  Tmax at home was 99.7 degrees.  History is provided primarily by her grandson.  Grandson reports that at baseline, patient ambulates well with a walker.  She has been using a walker due to history of arthritis.  She is also alert, oriented, and conversant at baseline.     Home Medications Prior to Admission medications   Medication Sig Start Date End Date Taking? Authorizing Provider  acetaminophen (TYLENOL) 500 MG tablet Take 1,000 mg by mouth every 6 (six) hours as needed (pain.). GELCAPS    [provider]  albuterol (VENTOLIN HFA) 108 (90 Base) MCG/ACT inhaler Inhale 1-2 puffs into the lungs every 6 (six) hours as needed for shortness of breath or wheezing. prn    [provider]  allopurinol (ZYLOPRIM) 100 MG tablet Take 100 mg by mouth in the morning.    [provider]  ALPRAZolam Duanne Moron) 0.5 MG tablet Take 1 tablet (0.5 mg total) by mouth 3 (three) times daily as needed. Patient taking differently: Take 0.5 mg by mouth in the morning, at noon, and at bedtime. 10/01/17   Barton Dubois, MD  dexlansoprazole (DEXILANT) 60  MG capsule Take 60 mg by mouth daily before breakfast.    [provider]  fluconazole (DIFLUCAN) 150 MG tablet Take 1 tablet (150 mg total) by mouth daily for 21 days. Take 2 tablets the first day, then take one daily 04/01/22 04/22/22  Montez Morita, Quillian Quince, MD  fluticasone [facility]) 50 MCG/ACT nasal spray Place 2 sprays into both nostrils daily as needed (allergies (cough/sneezing)). 09/24/20   [provider]  furosemide (LASIX) 20 MG tablet Take 20 mg by mouth in the morning.    [provider]  insulin aspart (NOVOLOG FLEXPEN RELION) 100 UNIT/ML FlexPen Inject 4-10 Units into the skin 3 (three) times daily with meals. Per sliding scale    [provider]  Insulin Glargine (BASAGLAR KWIKPEN [place]) Inject 14 Units into the skin at bedtime.    [provider]  Iron-Vitamins (GERITOL COMPLETE) TABS Take 1 tablet by mouth in the morning.    [provider]  latanoprost (XALATAN) 0.005 % ophthalmic solution Place 1 drop into both eyes at bedtime. 09/21/20   [provider]  LUTEIN PO Take 40 mg by mouth daily at 6 (six) AM.    [provider]  meclizine (ANTIVERT) 25 MG tablet Take 1 tablet (25 mg total) by mouth 3 (three) times daily as needed for dizziness. 07/30/21   Carlan, Chelsea L, NP  ondansetron (ZOFRAN) 4 MG tablet Take 4 mg by mouth every 6 (six) hours as  needed for nausea or vomiting.    [provider]  polyethylene glycol (MIRALAX / GLYCOLAX) packet Take 34 g by mouth in the morning.    [provider]  potassium chloride (MICRO-K) 10 MEQ CR capsule Take 10 mEq by mouth in the morning.    [provider]  sucralfate (CARAFATE) 1 GM/10ML suspension Take 1 g by mouth in the morning, at noon, and at bedtime.    [provider]  traMADol (ULTRAM) 50 MG tablet Take 100 mg by mouth 2 (two) times daily.    [provider]  triamcinolone cream (KENALOG) 0.1 % Apply 1 Application  topically 2 (two) times daily. 01/22/22   [provider]      Allergies    Amoxil [amoxicillin], Macrobid [nitrofurantoin monohyd macro], Pravachol [pravastatin], Codeine, Darvon [propoxyphene], Mirtazapine, Neurontin [gabapentin], Phenergan [promethazine], Rocephin [ceftriaxone], and Hydrocodone-acetaminophen    Review of Systems   Review of Systems  Constitutional:  Positive for activity change and appetite change.  Genitourinary:  Positive for dysuria.  Musculoskeletal:  Positive for arthralgias, back pain and gait problem.  Neurological:  Positive for weakness (Generalized).  All other systems reviewed and are negative.   Physical Exam Updated Vital Signs BP (!) 122/92 (BP Location: Right Arm)   Pulse 84   Resp 20   Ht '5\' 6"'$  (1.676 m)   Wt 69.9 kg   SpO2 93%   BMI 24.86 kg/m  Physical Exam Vitals and nursing note reviewed.  Constitutional:      General: She is not in acute distress.    Appearance: Normal appearance. She is well-developed. She is not toxic-appearing or diaphoretic.  HENT:     Head: Normocephalic and atraumatic.     Right Ear: External ear normal.     Left Ear: External ear normal.     Nose: Nose normal.     Mouth/Throat:     Mouth: Mucous membranes are dry.     Pharynx: Oropharynx is clear.  Eyes:     Extraocular Movements: Extraocular movements intact.     Conjunctiva/sclera: Conjunctivae normal.  Cardiovascular:     Rate and Rhythm: Normal rate and regular rhythm.     Heart sounds: No murmur heard. Pulmonary:     Effort: Pulmonary effort is normal. No respiratory distress.     Breath sounds: Normal breath sounds. No wheezing or rales.  Chest:     Chest wall: No tenderness.  Abdominal:     General: There is no distension.     Palpations: Abdomen is soft.     Tenderness: There is no abdominal tenderness. There is left CVA tenderness.  Musculoskeletal:        General: Swelling present.     Cervical back: Normal range of motion and neck  supple.     Right lower leg: No edema.     Left lower leg: No edema.     Comments: Swelling and warmth to right knee.  Grandson states that swelling is baseline.  Skin:    General: Skin is warm and dry.     Capillary Refill: Capillary refill takes less than 2 seconds.     Coloration: Skin is not jaundiced or pale.  Neurological:     General: No focal deficit present.     Mental Status: She is alert.  Psychiatric:        Mood and Affect: Mood normal.        Behavior: Behavior normal.     ED Results / Procedures /  Treatments   Labs (all labs ordered are listed, but only abnormal results are displayed) Labs Reviewed - No data to display  EKG None  Radiology No results found.  Procedures Procedures  {Document cardiac monitor, telemetry assessment procedure when appropriate:1}  Medications Ordered in ED Medications - No data to display  ED Course/ Medical Decision Making/ A&P                           Medical Decision Making Amount and/or Complexity of Data Reviewed Labs: ordered. Radiology: ordered. ECG/medicine tests: ordered.   This patient presents to the ED for concern of ***, this involves an extensive number of treatment options, and is a complaint that carries with it a high risk of complications and morbidity.  The differential diagnosis includes ***   Co morbidities that complicate the patient evaluation  ***   Additional history obtained:  Additional history obtained from *** External records from outside source obtained and reviewed including ***   Lab Tests:  I Ordered, and personally interpreted labs.  The pertinent results include:  ***   Imaging Studies ordered:  I ordered imaging studies including ***  I independently visualized and interpreted imaging which showed *** I agree with the radiologist interpretation   Cardiac Monitoring: / EKG:  The patient was maintained on a cardiac monitor.  I personally viewed and interpreted the  cardiac monitored which showed an underlying rhythm of: ***   Consultations Obtained:  I requested consultation with the ***,  and discussed lab and imaging findings as well as pertinent plan - they recommend: ***   Problem List / ED Course / Critical interventions / Medication management  Patient presents for fatigue, nausea, confusion, and decreased activity over the past 3 days.  Vital signs on arrival are within normal limits.  On exam, patient endorses pain to her right knee and bilateral feet.  Yolanda Bonine states that this is baseline for her.  She does have swelling of right knee and warmth without overlying skin changes.  Her abdomen is soft and nontender.  Breathing is unlabored.  She does endorse some left CVA tenderness.  Oral mucosa is dry.  IV fluids were ordered.  Septic work-up was initiated.  Additionally, given her fall that preceded her recent symptoms, will evaluate for traumatic injuries as well.  Rectal temperature was***. I ordered medication including ***  for ***  Reevaluation of the patient after these medicines showed that the patient {resolved/improved/worsened:23923::"improved"} I have reviewed the patients home medicines and have made adjustments as needed   Social Determinants of Health:  ***   Test / Admission - Considered:  ***   {Document critical care time when appropriate:1} {Document review of labs and clinical decision tools ie heart score, Chads2Vasc2 etc:1}  {Document your independent review of radiology images, and any outside records:1} {Document your discussion with family members, caretakers, and with consultants:1} {Document social determinants of health affecting pt's care:1} {Document your decision making why or why not admission, treatments were needed:1} Final Clinical Impression(s) / ED Diagnoses Final diagnoses:  None    Rx / DC Orders ED Discharge Orders     None

## 2022-04-13 NOTE — H&P (Addendum)
TRH H&P   Patient Demographics:    Traci Clark, is a 88 y.o. female  MRN: [id number]   DOB - March 24, 1931  Admit Date - 04/13/2022  Outpatient Primary MD for the patient is Lavella Lemons, PA  Referring MD/NP/PA: Dr Doren Custard  Patient coming from: home  Chief Complaint  Patient presents with   Altered Mental Status      HPI:    Traci Clark  is a 88 y.o. female, with medical history significant for hypertension, T2DM, GERD, gout and constipation, as well as recent endoscopy significant for candidal esophagitis for which she has started on p.o. Diflucan  -Patient lives with her grandson, she was brought by family due to altered mental status, upon questioning he reports she has been confused over the last 4 to 5 days, upon my evaluation she is awake alert oriented x 4, it does appear she is more weak, fatigued than actually confused, she denies any focal deficits, she had a fall 4 days ago, she is not on any blood thinner, she has not been able to get up, but unable to bear weight due to generalized weakness, she is alternating between walker and a cane for ambulation due to severe arthritis, -In ED her workup was significant for elevated creatinine at 2.1, from baseline 1.1, lactic acid initially elevated at 2.3 which has resolved, with IV fluids, repeat 1.9, white blood cell count 10.5, is elevated at 304, COVID/flu are negative, right knee x-ray significant for status post arthrocentesis by ED physician.  [facility]  hospitalist consulted to admit.   Review of systems:      A full 10 point Review of Systems was done, except as stated above, all other Review of Systems were negative.   With Past History of the following :    Past Medical History:  Diagnosis Date   Anxiety    Arthritis    Chronic constipation    CKD (chronic kidney disease), stage III ([place])    Colon cancer ([place])     colon   Complication of anesthesia    Diabetes mellitus without complication ([place])    DVT (deep vein thrombosis) in pregnancy    Dysphagia    GERD (gastroesophageal reflux disease)    Glaucoma    Gout    H/O hiatal hernia    History of kidney stones    Iliac artery aneurysm, bilateral (HCC)    MI (myocardial infarction) ([place]) 1993   PONV (postoperative nausea and vomiting)    Restless leg syndrome    Seizure ([place])    withdrawal from xanax   Vertigo       Past Surgical History:  Procedure Laterality Date   BIOPSY  07/21/2020   Procedure: BIOPSY;  Surgeon: Harvel Quale, MD;  [facility];  Service: Gastroenterology;;   BIOPSY  03/26/2022   Procedure: BIOPSY;  Surgeon: Harvel Quale,  MD;  [facility];  Service: Gastroenterology;;   CHOLECYSTECTOMY     COLECTOMY  2003   COLONOSCOPY N/A 07/29/2012   Procedure: COLONOSCOPY;  Surgeon: Rogene Houston, MD;  [facility];  Service: Endoscopy;  Laterality: N/A;  100   COLONOSCOPY WITH PROPOFOL N/A 12/13/2016   Procedure: COLONOSCOPY WITH PROPOFOL;  Surgeon: Rogene Houston, MD;  [facility];  Service: Endoscopy;  Laterality: N/A;  8:30   COLONOSCOPY WITH PROPOFOL N/A 07/21/2020   Procedure: COLONOSCOPY WITH PROPOFOL;  Surgeon: Harvel Quale, MD;  [facility];  Service: Gastroenterology;  Laterality: N/A;  AM   ESOPHAGEAL DILATION  11/03/2015   Procedure: ESOPHAGEAL DILATION;  Surgeon: Rogene Houston, MD;  [facility];  Service: Endoscopy;;   ESOPHAGEAL DILATION N/A 09/26/2017   Procedure: ESOPHAGEAL DILATION;  Surgeon: Rogene Houston, MD;  [facility];  Service: Endoscopy;  Laterality: N/A;   ESOPHAGOGASTRODUODENOSCOPY N/A 03/25/2014   Procedure: ESOPHAGOGASTRODUODENOSCOPY (EGD);  Surgeon: Rogene Houston, MD;  [facility];  Service: Endoscopy;  Laterality: N/A;  1055   ESOPHAGOGASTRODUODENOSCOPY N/A 11/03/2015    Procedure: ESOPHAGOGASTRODUODENOSCOPY (EGD);  Surgeon: Rogene Houston, MD;  [facility];  Service: Endoscopy;  Laterality: N/A;  1:55 - moved to 11:15 - Ann to notify - moved to 12:45 - pt knows to arrive at 11:45   ESOPHAGOGASTRODUODENOSCOPY N/A 09/26/2017   Procedure: ESOPHAGOGASTRODUODENOSCOPY (EGD);  Surgeon: Rogene Houston, MD;  [facility];  Service: Endoscopy;  Laterality: N/A;   ESOPHAGOGASTRODUODENOSCOPY (EGD) WITH PROPOFOL N/A 07/21/2020   Procedure: ESOPHAGOGASTRODUODENOSCOPY (EGD) WITH PROPOFOL;  Surgeon: Harvel Quale, MD;  [facility];  Service: Gastroenterology;  Laterality: N/A;   ESOPHAGOGASTRODUODENOSCOPY (EGD) WITH PROPOFOL N/A 03/26/2022   Procedure: ESOPHAGOGASTRODUODENOSCOPY (EGD) WITH PROPOFOL;  Surgeon: Harvel Quale, MD;  [facility];  Service: Gastroenterology;  Laterality: N/A;  1215 ASA 3   MALONEY DILATION N/A 03/25/2014   Procedure: MALONEY DILATION;  Surgeon: Rogene Houston, MD;  [facility];  Service: Endoscopy;  Laterality: N/A;   POLYPECTOMY  12/13/2016   Procedure: POLYPECTOMY;  Surgeon: Rogene Houston, MD;  [facility];  Service: Endoscopy;;  polyp   POLYPECTOMY  07/21/2020   Procedure: POLYPECTOMY;  Surgeon: Harvel Quale, MD;  [facility];  Service: Gastroenterology;;   TOTAL HIP ARTHROPLASTY Left    TUBAL LIGATION     VEIN SURGERY Bilateral    stripped vein due to DVT      Social History:     Social History   Tobacco Use   Smoking status: Never    Passive exposure: Never   Smokeless tobacco: Never  Substance Use Topics   Alcohol use: No    Alcohol/week: 0.0 standard drinks of alcohol        Family History :     Family History  Problem Relation Age of Onset   Heart disease Father       Home Medications:   Prior to Admission medications   Medication Sig Start Date End Date Taking? Authorizing Provider  acetaminophen  (TYLENOL) 500 MG tablet Take 1,000 mg by mouth every 6 (six) hours as needed (pain.). GELCAPS   Yes [provider]  albuterol (VENTOLIN HFA) 108 (90 Base) MCG/ACT inhaler Inhale 1-2 puffs into the lungs every 6 (six) hours as needed for shortness of breath or wheezing. prn   Yes [provider]  allopurinol (ZYLOPRIM) 100 MG tablet Take 100 mg by mouth in the morning.   Yes [provider]  ALPRAZolam (XANAX) 0.5 MG tablet Take 1 tablet (0.5 mg total) by mouth 3 (three) times daily as needed. Patient taking differently: Take 0.5 mg by mouth in the morning, at noon, and at bedtime. 10/01/17  Yes Barton Dubois, MD  dexlansoprazole (DEXILANT) 60 MG capsule Take 60 mg by mouth daily before breakfast.   Yes [provider]  fluconazole (DIFLUCAN) 150 MG tablet Take 1 tablet (150 mg total) by mouth daily for 21 days. Take 2 tablets the first day, then take one daily 04/01/22 04/22/22 Yes Montez Morita, Quillian Quince, MD  fluticasone [facility]) 50 MCG/ACT nasal spray Place 2 sprays into both nostrils daily as needed (allergies (cough/sneezing)). 09/24/20  Yes [provider]  furosemide (LASIX) 20 MG tablet Take 20 mg by mouth in the morning.   Yes [provider]  insulin aspart (NOVOLOG FLEXPEN RELION) 100 UNIT/ML FlexPen Inject 4-10 Units into the skin 3 (three) times daily with meals. Per sliding scale   Yes [provider]  Insulin Glargine (BASAGLAR KWIKPEN [place]) Inject 14 Units into the skin at bedtime.   Yes [provider]  latanoprost (XALATAN) 0.005 % ophthalmic solution Place 1 drop into both eyes at bedtime. 09/21/20  Yes [provider]  meclizine (ANTIVERT) 25 MG tablet Take 1 tablet (25 mg total) by mouth 3 (three) times daily as needed for dizziness. 07/30/21  Yes Carlan, Chelsea L, NP  ondansetron (ZOFRAN) 4 MG tablet Take 4 mg by mouth every 6 (six) hours as needed for nausea or vomiting.   Yes [provider]   polyethylene glycol (MIRALAX / GLYCOLAX) packet Take 34 g by mouth in the morning.   Yes [provider]  potassium chloride (MICRO-K) 10 MEQ CR capsule Take 10 mEq by mouth in the morning.   Yes [provider]  traMADol (ULTRAM) 50 MG tablet Take 100 mg by mouth 2 (two) times daily.   Yes [provider]  triamcinolone cream (KENALOG) 0.1 % Apply 1 Application topically 2 (two) times daily. 01/22/22  Yes [provider]     Allergies:     Allergies  Allergen Reactions   Amoxil [Amoxicillin] Anaphylaxis and Rash    "Skin peeled off"   Macrobid [Nitrofurantoin Monohyd Macro] Anaphylaxis, Rash and Other (See Comments)    "Skin peeled off"   Pravachol [Pravastatin] Other (See Comments)    MYALGIAS  MYALGIAS  MYALGIAS  MYALGIAS   Codeine Nausea And Vomiting and Other (See Comments)    shaking   Darvon [Propoxyphene] Nausea And Vomiting and Other (See Comments)    shaking   Mirtazapine Other (See Comments)    Does not tolerate   Neurontin [Gabapentin] Nausea Only    Feel faint   Phenergan [Promethazine] Other (See Comments)    altered mental changes   Rocephin [Ceftriaxone] Other (See Comments)    Yeast infection   Hydrocodone-Acetaminophen Nausea And Vomiting and Other (See Comments)    Causes chest to feel heavy       Physical Exam:   Vitals  Blood pressure (!) 140/82, pulse 70, temperature 98.9 F (37.2 C), temperature source Oral, resp. rate 19, height '5\' 6"'$  (1.676 m), weight 69.9 kg, SpO2 94 %.   1. General frail elderly female, laying in bed, no apparent distress  2. Normal affect and insight, Not Suicidal or Homicidal, Awake Alert, Oriented X 3.  3. No F.N deficits, ALL C.Nerves Intact, Strength 5/5 all 4 extremities, Sensation intact all 4 extremities, Plantars down going.  4. Ears and Eyes appear Normal, Conjunctivae clear, PERRLA. Moist Oral Mucosa.  5. Supple Neck, No JVD, No cervical lymphadenopathy appriciated, No  Carotid Bruits.  6. Symmetrical Chest wall movement, Good air movement bilaterally, CTAB.  7. RRR, No Gallops, Rubs or Murmurs, No Parasternal Heave.  8. Positive Bowel Sounds, Abdomen Soft, No tenderness, No organomegaly appriciated,No rebound -guarding or rigidity.  9.  No Cyanosis, Normal Skin Turgor, No Skin Rash or Bruise.  10. Good muscle tone, right knee swelling    Data Review:    CBC Recent Labs  Lab 04/13/22 1250 04/13/22 1318  WBC 10.5  --   HGB 13.4 14.6  HCT 40.1 43.0  PLT 178  --   MCV 89.1  --   MCH 29.8  --   MCHC 33.4  --   RDW 12.7  --   LYMPHSABS 1.7  --   MONOABS 1.0  --   EOSABS 0.0  --   BASOSABS 0.1  --    ------------------------------------------------------------------------------------------------------------------  Chemistries  Recent Labs  Lab 04/13/22 1250 04/13/22 1318  NA 134* 134*  K 4.8 4.8  CL 95* 96*  CO2 29  --   GLUCOSE 304* 311*  BUN 30* 29*  CREATININE 2.05* 2.10*  CALCIUM 9.6  --   MG 1.8  --   AST 30  --   ALT 15  --   ALKPHOS 94  --   BILITOT 2.1*  --    ------------------------------------------------------------------------------------------------------------------ estimated creatinine clearance is 17 mL/min (A) (by C-G formula based on SCr of 2.1 mg/dL (H)). ------------------------------------------------------------------------------------------------------------------ No results for input(s): "TSH", "T4TOTAL", "T3FREE", "THYROIDAB" in the last 72 hours.  Invalid input(s): "FREET3"  Coagulation profile Recent Labs  Lab 04/13/22 1250  INR 1.2   ------------------------------------------------------------------------------------------------------------------- No results for input(s): "DDIMER" in the last 72 hours. -------------------------------------------------------------------------------------------------------------------  Cardiac Enzymes No results for input(s): "CKMB", "TROPONINI",  "MYOGLOBIN" in the last 168 hours.  Invalid input(s): "CK" ------------------------------------------------------------------------------------------------------------------ No results found for: "BNP"   ---------------------------------------------------------------------------------------------------------------  Urinalysis    Component Value Date/Time   COLORURINE YELLOW 04/13/2022 [place] 04/13/2022 1329   LABSPEC 1.013 04/13/2022 1329   PHURINE 5.0 04/13/2022 1329   GLUCOSEU NEGATIVE 04/13/2022 1329   HGBUR NEGATIVE 04/13/2022 1329   BILIRUBINUR NEGATIVE 04/13/2022 1329   KETONESUR NEGATIVE 04/13/2022 1329   PROTEINUR NEGATIVE 04/13/2022 1329   UROBILINOGEN 0.2 06/27/2014 1044   NITRITE NEGATIVE 04/13/2022 1329   LEUKOCYTESUR NEGATIVE 04/13/2022 1329    ----------------------------------------------------------------------------------------------------------------   Imaging Results:    DG Knee 2 Views Right  Result Date: 04/13/2022 CLINICAL DATA:  Knee pain. EXAM: RIGHT KNEE - 1-2 VIEW COMPARISON:  None Available. FINDINGS: Moderate to large suprapatellar joint effusion identified. No signs of acute fracture or dislocation. There is tricompartment osteoarthritis, severe within the lateral compartment with joint space narrowing, subchondral sclerosis and marginal spur formation. IMPRESSION: 1. Moderate to large suprapatellar joint effusion. 2. Tricompartment osteoarthritis, severe within the lateral compartment. Electronically Signed   By: Kerby Moors M.D.   On: 04/13/2022 13:45   DG Chest Port 1 View  Result Date: 04/13/2022 CLINICAL DATA:  Sepsis.  Evaluate for abnormality. EXAM: PORTABLE CHEST 1 VIEW COMPARISON:  09/11/2020. FINDINGS: Normal cardiomediastinal contours. No pleural effusion or edema. No airspace opacities identified. The visualized osseous structures are unremarkable. IMPRESSION: No active disease. Electronically Signed   By: Lovena Le  Clovis Riley M.D.   On: 04/13/2022 13:43   CT Head Wo Contrast  Result Date: 04/13/2022 CLINICAL DATA:  88 year old female with head and neck injury. EXAM: CT HEAD WITHOUT CONTRAST CT CERVICAL SPINE WITHOUT CONTRAST TECHNIQUE: Multidetector CT imaging of the head and cervical spine was performed following the standard protocol without intravenous contrast. Multiplanar CT image reconstructions of the cervical spine were also generated. RADIATION DOSE REDUCTION: This exam was performed according to the departmental dose-optimization program which includes automated exposure control, adjustment of the mA and/or kV according to patient size and/or use of iterative reconstruction technique. COMPARISON:  09/24/2017 neck CT and 10/16/2005 head CT. Other studies are not available for comparison. FINDINGS: CT HEAD FINDINGS Brain: An age indeterminate LEFT thalamic infarct is noted, more likely chronic. No other infarct noted. There is no evidence of hemorrhage, hydrocephalus, extra-axial collection, mass lesion/mass effect or midline shift. Atrophy and chronic small-vessel white matter ischemic changes again noted. Vascular: Carotid and vertebral atherosclerotic calcifications are noted. Skull: Normal. Negative for fracture or focal lesion. Sinuses/Orbits: No acute finding. Other: None. CT CERVICAL SPINE FINDINGS Alignment: 2 mm spondylolisthesis at C4-5, C5-6 and C6-7 is unchanged. No acute subluxation identified. Skull base and vertebrae: No acute fracture. No primary bone lesion or focal pathologic process. Soft tissues and spinal canal: No prevertebral fluid or swelling. No visible canal hematoma. Disc levels: Multilevel degenerative disc disease, spondylosis and facet arthropathy again noted contributing to mild central spinal and foraminal narrowing at multiple levels. Upper chest: No acute abnormality Other: None IMPRESSION: 1. Age indeterminate LEFT thalamic infarct, more likely chronic. No other evidence of acute  intracranial abnormality. Atrophy and chronic small-vessel white matter ischemic changes. 2. No evidence of acute injury to the cervical spine. Multilevel degenerative changes within the cervical spine. Electronically Signed   By: Margarette Canada M.D.   On: 04/13/2022 13:41   CT Cervical Spine Wo Contrast  Result Date: 04/13/2022 CLINICAL DATA:  88 year old female with head and neck injury. EXAM: CT HEAD WITHOUT CONTRAST CT CERVICAL SPINE WITHOUT CONTRAST TECHNIQUE: Multidetector CT imaging of the head and cervical spine was performed following the standard protocol without intravenous contrast. Multiplanar CT image reconstructions of the cervical spine were also generated. RADIATION DOSE REDUCTION: This exam was performed according to the departmental dose-optimization program which includes automated exposure control, adjustment of the mA and/or kV according to patient size and/or use of iterative reconstruction technique. COMPARISON:  09/24/2017 neck CT and 10/16/2005 head CT. Other studies are not available for comparison. FINDINGS: CT HEAD FINDINGS Brain: An age indeterminate LEFT thalamic infarct is noted, more likely chronic. No other infarct noted. There is no evidence of hemorrhage, hydrocephalus, extra-axial collection, mass lesion/mass effect or midline shift. Atrophy and chronic small-vessel white matter ischemic changes again noted. Vascular: Carotid and vertebral atherosclerotic calcifications are noted. Skull: Normal. Negative for fracture or focal lesion. Sinuses/Orbits: No acute finding. Other: None. CT CERVICAL SPINE FINDINGS Alignment: 2 mm spondylolisthesis at C4-5, C5-6 and C6-7 is unchanged. No acute subluxation identified. Skull base and vertebrae: No acute fracture. No primary bone lesion or focal pathologic process. Soft tissues and spinal canal: No prevertebral fluid or swelling. No visible canal hematoma. Disc levels: Multilevel degenerative disc disease, spondylosis and facet arthropathy  again noted contributing to mild central spinal and foraminal narrowing at multiple levels. Upper chest: No acute abnormality Other: None IMPRESSION: 1. Age indeterminate LEFT thalamic infarct, more likely chronic. No other evidence of acute intracranial abnormality. Atrophy and chronic small-vessel white matter ischemic  changes. 2. No evidence of acute injury to the cervical spine. Multilevel degenerative changes within the cervical spine. Electronically Signed   By: Margarette Canada M.D.   On: 04/13/2022 13:41       Assessment & Plan:    Principal Problem:   AMS (altered mental status) Active Problems:   GERD (gastroesophageal reflux disease)   Diabetes (HCC)   Essential hypertension   Acute kidney injury superimposed on CKD (HCC)   Hyperglycemia due to diabetes mellitus (HCC)   AMS Weakness/fatigue Failure to thrive -Patient prior presents with progressive weakness, increased lethargy over the last few days -Far no evidence of infectious etiology, UA is clean, chest x-ray with no acute finding, CT head ends of chronic CVA but nothing acute -Workup significant for dehydration and AKI which we will treat. -Consult PT/OT, continue with IV fluids -Will decrease her tramadol and Xanax dose as well.  AKI on CKD 3B - Continue with IV fluids and hold nephrotoxic medications  History  of CVA -CT head with chronic left thalamic infarct, will start on aspirin.  Recent diagnosis of candidal esophagitis -With endoscopy, to continue with Diflucan x 21 days   GERD Continue with PPI   Diabetes mellitus type II -Continue with home dose Basaglar, will add insulin sliding scale, check A1c.   Essential hypertension (controlled) Does not appear to be on any home medication, continue to monitor   Gout Continue with home dose allopurinol  Right knee effusion -This is a chronic as discussed with family, status post arthrocentesis, most likely due to gout, follow on crystals, cell count, Gram  stain and culture    DVT Prophylaxis Heparin  AM Labs Ordered, also please review Full Orders  Family Communication: Admission, patients condition and plan of care including tests being ordered have been discussed with the patient, daughter, and grandson  who indicate understanding and agree with the plan and Code Status.  Code Status DNR, confirmed by patient, family at bedside  Likely DC to  home  Condition GUARDED    Consults called: none    Admission status: observation    Time spent in minutes : 70 minutes   Phillips Climes M.D on 04/13/2022 at 5:28 PM   [facility] - Office  [phone]

## 2022-04-13 NOTE — ED Notes (Signed)
Both sets of blood cultures drawn before any antibiotic administration  

## 2022-04-13 NOTE — ED Notes (Signed)
CBG 207, MD made aware

## 2022-04-13 NOTE — ED Triage Notes (Signed)
Per patient c/o generalized fatigue with low grade fever, nausea, and confusion. Per grandson patient has had symptoms with confusion since Wednesday. Patient normal ambulates with walker and does well. Patient fell Tuesday with no obvious  injury but reports hitting back of head. Denies taking any type of anticoagulant. Patient also reports some dysuria. Denies any diarrhea. Per grandson highest temp 99.7 at home.

## 2022-04-14 DIAGNOSIS — Z66 Do not resuscitate: Secondary | ICD-10-CM | POA: Diagnosis not present

## 2022-04-14 DIAGNOSIS — Z8673 Personal history of transient ischemic attack (TIA), and cerebral infarction without residual deficits: Secondary | ICD-10-CM | POA: Diagnosis not present

## 2022-04-14 DIAGNOSIS — W19XXXA Unspecified fall, initial encounter: Secondary | ICD-10-CM | POA: Diagnosis present

## 2022-04-14 DIAGNOSIS — N1832 Chronic kidney disease, stage 3b: Secondary | ICD-10-CM | POA: Diagnosis not present

## 2022-04-14 DIAGNOSIS — N179 Acute kidney failure, unspecified: Secondary | ICD-10-CM | POA: Diagnosis not present

## 2022-04-14 DIAGNOSIS — B3781 Candidal esophagitis: Secondary | ICD-10-CM | POA: Diagnosis not present

## 2022-04-14 DIAGNOSIS — I129 Hypertensive chronic kidney disease with stage 1 through stage 4 chronic kidney disease, or unspecified chronic kidney disease: Secondary | ICD-10-CM | POA: Diagnosis not present

## 2022-04-14 DIAGNOSIS — E1122 Type 2 diabetes mellitus with diabetic chronic kidney disease: Secondary | ICD-10-CM | POA: Diagnosis not present

## 2022-04-14 DIAGNOSIS — R627 Adult failure to thrive: Secondary | ICD-10-CM | POA: Diagnosis not present

## 2022-04-14 DIAGNOSIS — Z794 Long term (current) use of insulin: Secondary | ICD-10-CM | POA: Diagnosis not present

## 2022-04-14 DIAGNOSIS — G2581 Restless legs syndrome: Secondary | ICD-10-CM | POA: Diagnosis not present

## 2022-04-14 DIAGNOSIS — K5900 Constipation, unspecified: Secondary | ICD-10-CM | POA: Diagnosis present

## 2022-04-14 DIAGNOSIS — E872 Acidosis, unspecified: Secondary | ICD-10-CM | POA: Diagnosis not present

## 2022-04-14 DIAGNOSIS — Z1152 Encounter for screening for COVID-19: Secondary | ICD-10-CM | POA: Diagnosis not present

## 2022-04-14 DIAGNOSIS — R404 Transient alteration of awareness: Secondary | ICD-10-CM | POA: Diagnosis not present

## 2022-04-14 DIAGNOSIS — Z87442 Personal history of urinary calculi: Secondary | ICD-10-CM | POA: Diagnosis not present

## 2022-04-14 DIAGNOSIS — Z8249 Family history of ischemic heart disease and other diseases of the circulatory system: Secondary | ICD-10-CM | POA: Diagnosis not present

## 2022-04-14 DIAGNOSIS — E1165 Type 2 diabetes mellitus with hyperglycemia: Secondary | ICD-10-CM | POA: Diagnosis present

## 2022-04-14 DIAGNOSIS — K219 Gastro-esophageal reflux disease without esophagitis: Secondary | ICD-10-CM

## 2022-04-14 DIAGNOSIS — Z885 Allergy status to narcotic agent status: Secondary | ICD-10-CM | POA: Diagnosis not present

## 2022-04-14 DIAGNOSIS — E860 Dehydration: Secondary | ICD-10-CM | POA: Diagnosis present

## 2022-04-14 DIAGNOSIS — Z85038 Personal history of other malignant neoplasm of large intestine: Secondary | ICD-10-CM | POA: Diagnosis not present

## 2022-04-14 DIAGNOSIS — I252 Old myocardial infarction: Secondary | ICD-10-CM | POA: Diagnosis not present

## 2022-04-14 DIAGNOSIS — G9341 Metabolic encephalopathy: Secondary | ICD-10-CM | POA: Diagnosis not present

## 2022-04-14 DIAGNOSIS — M109 Gout, unspecified: Secondary | ICD-10-CM | POA: Diagnosis present

## 2022-04-14 DIAGNOSIS — Z23 Encounter for immunization: Secondary | ICD-10-CM | POA: Diagnosis present

## 2022-04-14 DIAGNOSIS — R5381 Other malaise: Secondary | ICD-10-CM

## 2022-04-14 LAB — GLUCOSE, CAPILLARY
Glucose-Capillary: 164 mg/dL — ABNORMAL HIGH (ref 70–99)
Glucose-Capillary: 202 mg/dL — ABNORMAL HIGH (ref 70–99)
Glucose-Capillary: 165 mg/dL — ABNORMAL HIGH (ref 70–99)
Glucose-Capillary: 213 mg/dL — ABNORMAL HIGH (ref 70–99)
Glucose-Capillary: 180 mg/dL — ABNORMAL HIGH (ref 70–99)

## 2022-04-14 LAB — BASIC METABOLIC PANEL
Anion gap: 9 (ref 5–15)
BUN: 25 mg/dL — ABNORMAL HIGH (ref 8–23)
CO2: 28 mmol/L (ref 22–32)
Calcium: 9.1 mg/dL (ref 8.9–10.3)
Chloride: 98 mmol/L (ref 98–111)
Creatinine, Ser: 1.63 mg/dL — ABNORMAL HIGH (ref 0.44–1.00)
GFR, Estimated: 30 mL/min — ABNORMAL LOW (ref >60)
Glucose, Bld: 180 mg/dL — ABNORMAL HIGH (ref 70–99)
Potassium: 4.4 mmol/L (ref 3.5–5.1)
Sodium: 135 mmol/L (ref 135–145)

## 2022-04-14 LAB — CBC
HCT: 36.1 % (ref 36.0–46.0)
Hemoglobin: 11.9 g/dL — ABNORMAL LOW (ref 12.0–15.0)
MCH: 29.7 pg (ref 26.0–34.0)
MCHC: 33 g/dL (ref 30.0–36.0)
MCV: 90 fL (ref 80.0–100.0)
Platelets: 177 10*3/uL (ref 150–400)
RBC: 4.01 MIL/uL (ref 3.87–5.11)
RDW: 12.5 % (ref 11.5–15.5)
WBC: 9.9 10*3/uL (ref 4.0–10.5)
nRBC: 0 % (ref 0.0–0.2)

## 2022-04-14 MED ORDER — MUSCLE RUB 10-15 % EX CREA
TOPICAL_CREAM | Freq: Three times a day (TID) | CUTANEOUS | Status: DC | PRN
  Filled 2022-04-14: qty 85

## 2022-04-14 MED ORDER — TROLAMINE SALICYLATE 10 % EX CREA
TOPICAL_CREAM | Freq: Three times a day (TID) | CUTANEOUS | Status: DC | PRN
  Filled 2022-04-14: qty 85

## 2022-04-14 NOTE — NC FL2 (Signed)
[place] LEVEL OF CARE SCREENING TOOL     IDENTIFICATION  Patient Name: Traci Clark Birthdate: June 10, 1930 Sex: female Admission Date (Current Location): 04/13/2022  [facility] and [place] Number:  [employer] and Address:  [facility] [address] [place]      Provider Number: [id number]  Attending Physician Name and Address:  Barton Dubois, MD  Relative Name and Phone Number:       Current Level of Care: Hospital Recommended Level of Care: [place] Prior Approval Number:    Date Approved/Denied:   PASRR Number:    Discharge Plan: SNF    Current Diagnoses: Patient Active Problem List   Diagnosis Date Noted   AKI (acute kidney injury) ([place]) 04/14/2022   AMS (altered mental status) 04/13/2022   Abdominal pain, epigastric 02/04/2022   Benign paroxysmal positional vertigo of right ear 07/30/2021   Palpitations 06/21/2021   Chest pain    Intractable nausea and vomiting 09/12/2020   Intractable abdominal pain 09/12/2020   Acute kidney injury superimposed on CKD ([place]) 09/12/2020   Bilateral iliac artery aneurysm ([place]) 09/12/2020   Hyperglycemia due to diabetes mellitus ([place]) 09/12/2020   Constipation 09/12/2020   IBS (irritable colon syndrome) 07/06/2020   Melena 07/06/2020   Rectal bleeding 07/06/2020   Generalized abdominal pain    Diabetes ([place]) 09/24/2017   Essential hypertension 09/24/2017   Gout 09/24/2017   Anxiety 09/24/2017   Nausea & vomiting 09/24/2017   Pancreatitis 09/24/2017   History of colon cancer 10/25/2016   Dysphagia, pharyngoesophageal phase 06/27/2014   GERD (gastroesophageal reflux disease) 03/23/2014   RLS (restless legs syndrome) 03/23/2014   Colon cancer ([place]) 07/14/2012    Orientation RESPIRATION BLADDER Height & Weight     Self, Time, Situation, Place  Normal Continent, External catheter Weight: 155 lb 6.8 oz (70.5 kg) Height:  '5\' 6"'$  (167.6 cm)  BEHAVIORAL  SYMPTOMS/MOOD NEUROLOGICAL BOWEL NUTRITION STATUS      Continent Diet (See D/C summary)  AMBULATORY STATUS COMMUNICATION OF NEEDS Skin   Extensive Assist Verbally Normal                       Personal Care Assistance Level of Assistance  Bathing, Feeding, Dressing Bathing Assistance: Limited assistance Feeding assistance: Independent Dressing Assistance: Limited assistance     Functional Limitations Info  Sight, Hearing, Speech Sight Info: Impaired Hearing Info: Adequate Speech Info: Adequate    SPECIAL CARE FACTORS FREQUENCY  PT (By licensed PT), OT (By licensed OT)     PT Frequency: 5 times weekly OT Frequency: 5 times weekly            Contractures Contractures Info: Not present    Additional Factors Info  Code Status, Allergies Code Status Info: DNR Allergies Info: Amoxil (Amoxicillin), Macrobid (Nitrofurantoin Monohyd Macro), Pravachol (Pravastatin), Codeine, Darvon (Propoxyphene), Mirtazapine, Neurontin (Gabapentin), Phenergan (Promethazine), Rocephin (Ceftriaxone), Hydrocodone-acetaminophen           Current Medications (04/14/2022):  This is the current hospital active medication list Current Facility-Administered Medications  Medication Dose Route Frequency Provider Last Rate Last Admin   0.9 %  sodium chloride infusion   Intravenous Continuous Elgergawy, Silver Huguenin, MD 75 mL/hr at 04/13/22 2147 New Bag at 04/13/22 2147   acetaminophen (TYLENOL) tablet 650 mg  650 mg Oral Q6H PRN Elgergawy, Silver Huguenin, MD       Or   acetaminophen (TYLENOL) suppository 650 mg  650 mg Rectal Q6H  PRN Elgergawy, Silver Huguenin, MD       allopurinol (ZYLOPRIM) tablet 100 mg  100 mg Oral q AM Elgergawy, Silver Huguenin, MD   100 mg at 04/14/22 0658   ALPRAZolam (XANAX) tablet 0.25 mg  0.25 mg Oral BID PRN Elgergawy, Silver Huguenin, MD       aspirin EC tablet 81 mg  81 mg Oral Daily Elgergawy, Silver Huguenin, MD   81 mg at 04/14/22 0824   fluconazole (DIFLUCAN) tablet 150 mg  150 mg Oral Daily Elgergawy,  Silver Huguenin, MD   150 mg at 04/14/22 0824   heparin injection 5,000 Units  5,000 Units Subcutaneous Q8H Elgergawy, Silver Huguenin, MD   5,000 Units at 04/14/22 1223   influenza vaccine adjuvanted (FLUAD) injection 0.5 mL  0.5 mL Intramuscular Tomorrow-1000 Elgergawy, Silver Huguenin, MD       insulin aspart (novoLOG) injection 0-5 Units  0-5 Units Subcutaneous QHS Elgergawy, Silver Huguenin, MD   2 Units at 04/13/22 2358   insulin aspart (novoLOG) injection 0-9 Units  0-9 Units Subcutaneous TID WC Elgergawy, Silver Huguenin, MD   2 Units at 04/14/22 1222   insulin detemir (LEVEMIR) injection 14 Units  14 Units Subcutaneous QHS Zierle-Ghosh, Asia B, DO       latanoprost (XALATAN) 0.005 % ophthalmic solution 1 drop  1 drop Both Eyes QHS Elgergawy, Silver Huguenin, MD   1 drop at 04/13/22 2046   Muscle Rub CREA   Topical Q8H PRN Madueme, Elvira C, RPH       pantoprazole (PROTONIX) EC tablet 40 mg  40 mg Oral Daily Elgergawy, Silver Huguenin, MD   40 mg at 04/14/22 0824   traMADol (ULTRAM) tablet 50 mg  50 mg Oral Q12H PRN Elgergawy, Silver Huguenin, MD   50 mg at 04/14/22 4944     Discharge Medications: Please see discharge summary for a list of discharge medications.  Relevant Imaging Results:  Relevant Lab Results:   Additional Information SSN: [id number] [place]

## 2022-04-14 NOTE — Plan of Care (Signed)
  Problem: Acute Rehab PT Goals(only PT should resolve) Goal: Pt Will Go Supine/Side To Sit Outcome: Progressing Flowsheets (Taken 04/14/2022 0940) Pt will go Supine/Side to Sit: with minimal assist Goal: Patient Will Transfer Sit To/From Stand Outcome: Progressing Flowsheets (Taken 04/14/2022 0940) Patient will transfer sit to/from stand: with minimal assist Goal: Pt Will Transfer Bed To Chair/Chair To Bed Outcome: Progressing Flowsheets (Taken 04/14/2022 0940) Pt will Transfer Bed to Chair/Chair to Bed: with min assist Goal: Pt Will Ambulate Outcome: Progressing Flowsheets (Taken 04/14/2022 0940) Pt will Ambulate:  10 feet  with moderate assist  with rolling walker

## 2022-04-14 NOTE — Evaluation (Signed)
Physical Therapy Evaluation Patient Details Name: Traci Clark MRN: [id number] DOB: 08/22/1930 Today's Date: 04/14/2022  History of Present Illness  Traci Clark  is a 88 y.o. female, with medical history significant for hypertension, T2DM, GERD, gout and constipation, as well as recent endoscopy significant for candidal esophagitis for which she has started on p.o. Diflucan   -Patient lives with her grandson, she was brought by family due to altered mental status, upon questioning he reports she has been confused over the last 4 to 5 days, upon my evaluation she is awake alert oriented x 4, it does appear she is more weak, fatigued than actually confused, she denies any focal deficits, she had a fall 4 days ago, she is not on any blood thinner, she has not been able to get up, but unable to bear weight due to generalized weakness, she is alternating between walker and a cane for ambulation due to severe arthritis,  -In ED her workup was significant for elevated creatinine at 2.1, from baseline 1.1, lactic acid initially elevated at 2.3 which has resolved, with IV fluids, repeat 1.9, white blood cell count 10.5, is elevated at 304, COVID/flu are negative, right knee x-ray significant for status post arthrocentesis by ED physician.  [facility]  hospitalist consulted to admit.    Clinical Impression  Patient lying in bed on therapist arrival.  Agreeable to therapy evaluation.  Patient reports high pain levels 8/10 in her right knee and both feet.  Patient performs supine to sit with min to mod assist from therapist to assist with bringing her trunk fully upright and coming fully to the edge of the bed.  Patient is quite painful and therefore slow with movement and guards her right knee especially.  Of note her right knee is swollen and warm.  Patient able to sit on the edge of of the bed with therapist min assist. She sits with forward flexed trunk and continues to guard due to pain.  Patient needs min to  mod assist to return to lying in supine and max Assist to scoot back up in bed.  Patient left in bed with bed alarm on and call button in reach.  Patient will benefit from continued skilled therapy services during the remainder of her hospital stay and at the next recommended venue of care to address deficits and promote return to optimal function.         Recommendations for follow up therapy are one component of a multi-disciplinary discharge planning process, led by the attending physician.  Recommendations may be updated based on patient status, additional functional criteria and insurance authorization.  Follow Up Recommendations Skilled nursing-short term rehab (<3 hours/day) Can patient physically be transported by private vehicle: No    Assistance Recommended at Discharge Frequent or constant Supervision/Assistance  Patient can return home with the following  A lot of help with walking and/or transfers;A lot of help with bathing/dressing/bathroom;Help with stairs or ramp for entrance;Assist for transportation    Equipment Recommendations None recommended by PT  Recommendations for Other Services       Functional Status Assessment Patient has had a recent decline in their functional status and demonstrates the ability to make significant improvements in function in a reasonable and predictable amount of time.     Precautions / Restrictions Precautions Precautions: Fall Restrictions Weight Bearing Restrictions: No      Mobility  Bed Mobility Overal bed mobility: Needs Assistance Bed Mobility: Supine to Sit, Sit to Supine  Supine to sit: Mod assist, Min assist Sit to supine: Min assist, Mod assist   General bed mobility comments: takes extra time and need assist with trunk to bring fully up to sitting; needs assist to put legs back in bed with sit to supine; needs assist to scoot back up in bed    Transfers                   General transfer comment: did not  attempt transfer or stand due to patient high pain level    Ambulation/Gait                  Stairs            Wheelchair Mobility    Modified Rankin (Stroke Patients Only)       Balance Overall balance assessment: Needs assistance Sitting-balance support: Feet supported, Bilateral upper extremity supported Sitting balance-Leahy Scale: Fair Sitting balance - Comments: fair sitting balance with feet and hands supporting                                     Pertinent Vitals/Pain Pain Assessment Pain Assessment: 0-10 Pain Score: 8  Pain Location: feet, right knee Pain Intervention(s): Limited activity within patient's tolerance, Monitored during session    [place] expects to be discharged to:: Private residence Living Arrangements: Other relatives (lives with grandson) Available Help at Discharge: Family;Available 24 hours/day Type of Home: House Home Access: Level entry       Home Layout: One level Home Equipment: Shower seat;Cane - single point;Rolling Walker (2 wheels);Rollator (4 wheels);Wheelchair - manual;Grab bars - tub/shower      Prior Function Prior Level of Function : Needs assist       Physical Assist : Mobility (physical) Mobility (physical): Gait           Hand Dominance   Dominant Hand: Right    Extremity/Trunk Assessment   Upper Extremity Assessment Upper Extremity Assessment: Generalized weakness    Lower Extremity Assessment Lower Extremity Assessment: Generalized weakness (noted swelling and warmth right knee and bilateral ankles)       Communication   Communication: No difficulties  Cognition Arousal/Alertness: Awake/alert Behavior During Therapy: WFL for tasks assessed/performed Overall Cognitive Status: Within Functional Limits for tasks assessed                                 General Comments: Patient answers all questions appropriately        General  Comments      Exercises     Assessment/Plan    PT Assessment Patient needs continued PT services  PT Problem List Decreased strength;Pain;Decreased range of motion;Decreased activity tolerance;Decreased balance;Decreased mobility       PT Treatment Interventions Balance training;Gait training;Neuromuscular re-education;Functional mobility training;Patient/family education;Therapeutic activities;Therapeutic exercise    PT Goals (Current goals can be found in the Care Plan section)  Acute Rehab PT Goals Patient Stated Goal: return home PT Goal Formulation: With patient Time For Goal Achievement: 04/28/22    Frequency Min 3X/week     Co-evaluation               AM-PAC PT "6 Clicks" Mobility  Outcome Measure Help needed turning from your back to your side while in a flat bed without using bedrails?: A Little Help needed moving from lying  on your back to sitting on the side of a flat bed without using bedrails?: A Lot Help needed moving to and from a bed to a chair (including a wheelchair)?: A Lot Help needed standing up from a chair using your arms (e.g., wheelchair or bedside chair)?: A Lot Help needed to walk in hospital room?: A Lot Help needed climbing 3-5 steps with a railing? : Total 6 Click Score: 12    End of Session   Activity Tolerance: Patient limited by pain Patient left: in bed;with call bell/phone within reach;with bed alarm set Nurse Communication: Mobility status PT Visit Diagnosis: Other abnormalities of gait and mobility (R26.89);Repeated falls (R29.6);Muscle weakness (generalized) (M62.81);History of falling (Z91.81)    Time: 5615-3794 PT Time Calculation (min) (ACUTE ONLY): 26 min   Charges:   PT Evaluation $PT Eval Low Complexity: 1 Low          9:40 AM, 04/14/22 Hadja Harral Small Kristi Hyer MPT [facility] [place] [phone] [phone]

## 2022-04-14 NOTE — TOC Initial Note (Signed)
Transition of Care [facility]) - Initial/Assessment Note    Patient Details  Name: Alicya Bena MRN: [id number] Date of Birth: Jun 23, 1930  [facility] CM/SW Contact:    Iona Beard, [place] Phone Number: 04/14/2022, 2:35 PM  Clinical Narrative:                 PT recommending SNF at D/C. CSW spoke with pt and grandson who states they are agreeable to SNF referral being completed and sent to [place] facilities. Pt lives with her grandson. Pt is normally independent in completing ADLs. Pt has a cane and walker to use when needed. TOC to follow.   Expected Discharge Plan: Skilled Nursing Facility Barriers to Discharge: Continued Medical Work up   Patient Goals and CMS Choice Patient states their goals for this hospitalization and ongoing recovery are:: go to SNF CMS Medicare.gov Compare Post Acute Care list provided to:: Patient Choice offered to / list presented to : Patient (grandson)  Expected Discharge Plan and Services Expected Discharge Plan: [place] In-house Referral: Clinical Social Work Discharge Planning Services: CM Consult Post Acute Care Choice: [place] Living arrangements for the past 2 months: [place]                                      Prior Living Arrangements/Services Living arrangements for the past 2 months: Single Family Home Lives with:: Relatives Patient language and need for interpreter reviewed:: Yes Do you feel safe going back to the place where you live?: Yes      Need for Family Participation in Patient Care: Yes (Comment) Care giver support system in place?: Yes (comment) Current home services: DME Criminal Activity/Legal Involvement Pertinent to Current Situation/Hospitalization: No - Comment as needed  Activities of Daily Living Home Assistive Devices/Equipment: Shower chair with back, Environmental consultant (specify type) ADL Screening (condition at time of admission) Patient's cognitive  ability adequate to safely complete daily activities?: Yes Is the patient deaf or have difficulty hearing?: No Does the patient have difficulty seeing, even when wearing glasses/contacts?: No Does the patient have difficulty concentrating, remembering, or making decisions?: No Patient able to express need for assistance with ADLs?: Yes Does the patient have difficulty dressing or bathing?: No Independently performs ADLs?: Yes (appropriate for developmental age) Does the patient have difficulty walking or climbing stairs?: No Weakness of Legs: None Weakness of Arms/Hands: None  Permission Sought/Granted                  Emotional Assessment Appearance:: Appears stated age Attitude/Demeanor/Rapport: Engaged Affect (typically observed): Accepting Orientation: : Oriented to Self, Oriented to Place, Oriented to  Time, Oriented to Situation Alcohol / Substance Use: Not Applicable Psych Involvement: No (comment)  Admission diagnosis:  AMS (altered mental status) [R41.82] AKI (acute kidney injury) ([place]) [N17.9] Patient Active Problem List   Diagnosis Date Noted   AKI (acute kidney injury) ([place]) 04/14/2022   AMS (altered mental status) 04/13/2022   Abdominal pain, epigastric 02/04/2022   Benign paroxysmal positional vertigo of right ear 07/30/2021   Palpitations 06/21/2021   Chest pain    Intractable nausea and vomiting 09/12/2020   Intractable abdominal pain 09/12/2020   Acute kidney injury superimposed on CKD ([place]) 09/12/2020   Bilateral iliac artery aneurysm ([place]) 09/12/2020   Hyperglycemia due to diabetes mellitus ([place]) 09/12/2020   Constipation 09/12/2020   IBS (irritable colon syndrome) 07/06/2020  Melena 07/06/2020   Rectal bleeding 07/06/2020   Generalized abdominal pain    Diabetes ([place]) 09/24/2017   Essential hypertension 09/24/2017   Gout 09/24/2017   Anxiety 09/24/2017   Nausea & vomiting 09/24/2017   Pancreatitis 09/24/2017   History of colon cancer  10/25/2016   Dysphagia, pharyngoesophageal phase 06/27/2014   GERD (gastroesophageal reflux disease) 03/23/2014   RLS (restless legs syndrome) 03/23/2014   Colon cancer ([place]) 07/14/2012   PCP:  Lavella Lemons, PA Pharmacy:   [id number] [place] [address]Phone: [phone]: [phone] [id number] [place] - [address] [place] [address]Phone: [phone]: [phone]    Social Determinants of Health (SDOH) Interventions    Readmission Risk Interventions     No data to display

## 2022-04-14 NOTE — Progress Notes (Signed)
Progress Note   Patient: Traci Clark:[id number] DOB: 1930-09-02 DOA: 04/13/2022     0 DOS: the patient was seen and examined on 04/14/2022   Brief hospital course: Traci Clark  is a 88 y.o. female, with medical history significant for hypertension, T2DM, GERD, gout and constipation, as well as recent endoscopy significant for candidal esophagitis for which she has started on p.o. Diflucan  -Patient lives with her grandson, she was brought by family due to altered mental status, upon questioning he reports she has been confused over the last 4 to 5 days, upon my evaluation she is awake alert oriented x 4, it does appear she is more weak, fatigued than actually confused, she denies any focal deficits, she had a fall 4 days ago, she is not on any blood thinner, she has not been able to get up, but unable to bear weight due to generalized weakness, she is alternating between walker and a cane for ambulation due to severe arthritis, -In ED her workup was significant for elevated creatinine at 2.1, from baseline 1.1, lactic acid initially elevated at 2.3 which has resolved, with IV fluids, repeat 1.9, white blood cell count 10.5, is elevated at 304, COVID/flu are negative, right knee x-ray significant for status post arthrocentesis by ED physician.  [facility]  hospitalist consulted to admit.  Assessment and Plan: 1-altered mental status, weakness/fatigue and failure to thrive -All trigger most likely in the setting of dehydration with acute kidney injury -Renal function improved with fluid resuscitation but is still not back to baseline. -No surgical infection identified -CT head demonstrating no acute intracranial abnormalities. -Continue IV fluids and follow clinical response.  2-dysphagia -In the setting of esophageal candidiasis -Continue treatment with Diflucan and PPI -Advance diet as tolerated  3-acute kidney injury on chronic kidney disease stage IIIb -Appears to be secondary to  prerenal azotemia and dehydration -Continue IV fluid -Avoid nephrotoxic agents, hypotension and the use of contrast -Follow renal function trend.  4-gastroesophageal for disease -Continue PPI.  5-type 2 diabetes -Continue sliding scale insulin and long-term insulin and adjusted dose -Follow CBGs fluctuation. -Hypertension -Overall vital signs stable -At home not taking any medication for blood pressure.  6-history of gout/right knee effusion -Chronic issue for patient with previous arthrocentesis -No signs of infection -Most likely osteoarthritis versus gout -Continue analgesics -Aspercreme to be applied as needed every 8 hours provided -follow clinical response.  Subjective:  Afebrile, no chest pain, no nausea or vomiting; reports feeling slightly better.  Still with some difficulty swallowing and having sudden odynophagia, but would like to attempt dysphagia 2 diet.  Renal function is still not back to baseline.  Physical Exam: Vitals:   04/13/22 2035 04/13/22 2041 04/14/22 0100 04/14/22 0358  BP: 125/65  (!) 159/80 (!) 146/71  Pulse: 70 71 71 68  Resp: '20  17 18  '$ Temp: 97.8 F (36.6 C)  98 F (36.7 C) 98.7 F (37.1 C)  TempSrc:    Oral  SpO2: (!) 89% 91% 96% 95%  Weight:      Height:       General exam: Alert, awake, oriented x 3; reports feeling slightly better and denies chest pain, shortness of breath, nausea or vomiting.  Still having some difficulty swallowing but would like to try dysphagia 2 diet. Respiratory system: Good air movement bilaterally; no using accessory muscles.  Good saturation on room air. Cardiovascular system:RRR. No rubs or gallops. Gastrointestinal system: Abdomen is nondistended, soft and nontender. No organomegaly or masses felt.  Normal bowel sounds heard. Central nervous system: Alert and oriented. No focal neurological deficits. Extremities: No cyanosis or clubbing.  Patient reports pain in her knees bilaterally and demonstrate decreased  range of motion. Skin: No petechiae. Psychiatry: Judgement and insight appear normal. Mood & affect appropriate.   Data Reviewed: WBCs 9.9, hemoglobin 11.9, platelet count 378 K Basic metabolic panel: Sodium 588, potassium 4.4, chloride 9.8, bicarb 28, BUN 25, creatinine 1.63; GFR 80  Family Communication: Daughter and grandson at bedside  Disposition: Status is: Inpatient Remains inpatient appropriate because: Continue fluid resuscitation and follow renal function trend.  Evaluation by physical therapy recommending skilled medical history intubated   Planned Discharge Destination: Home with Home Health VS SNF.   Time spent: 35 minutes  Author: Barton Dubois, MD 04/14/2022 1:31 PM  For on call review [email or web address].

## 2022-04-14 NOTE — Progress Notes (Addendum)
Pt c/o discomfort in bladder. She had not voided from 1900-0200. Bladder scan showed 713 mL. On call notified. In & Out catheterization yielded 827m clear, yellow urine. Pt tolerated well.   Pt also refused scheduled levimir at bedtime stating, "I don't take long acting at night. It would make my sugar too low.

## 2022-04-15 DIAGNOSIS — N179 Acute kidney failure, unspecified: Secondary | ICD-10-CM | POA: Diagnosis not present

## 2022-04-15 DIAGNOSIS — E1122 Type 2 diabetes mellitus with diabetic chronic kidney disease: Secondary | ICD-10-CM | POA: Diagnosis not present

## 2022-04-15 DIAGNOSIS — K219 Gastro-esophageal reflux disease without esophagitis: Secondary | ICD-10-CM | POA: Diagnosis not present

## 2022-04-15 DIAGNOSIS — R404 Transient alteration of awareness: Secondary | ICD-10-CM | POA: Diagnosis not present

## 2022-04-15 LAB — GLUCOSE, CAPILLARY
Glucose-Capillary: 155 mg/dL — ABNORMAL HIGH (ref 70–99)
Glucose-Capillary: 146 mg/dL — ABNORMAL HIGH (ref 70–99)
Glucose-Capillary: 202 mg/dL — ABNORMAL HIGH (ref 70–99)
Glucose-Capillary: 134 mg/dL — ABNORMAL HIGH (ref 70–99)
Glucose-Capillary: 298 mg/dL — ABNORMAL HIGH (ref 70–99)

## 2022-04-15 LAB — URINE CULTURE: Culture: NO GROWTH

## 2022-04-15 LAB — BASIC METABOLIC PANEL
Anion gap: 7 (ref 5–15)
BUN: 24 mg/dL — ABNORMAL HIGH (ref 8–23)
CO2: 26 mmol/L (ref 22–32)
Calcium: 8.8 mg/dL — ABNORMAL LOW (ref 8.9–10.3)
Chloride: 104 mmol/L (ref 98–111)
Creatinine, Ser: 1.4 mg/dL — ABNORMAL HIGH (ref 0.44–1.00)
GFR, Estimated: 36 mL/min — ABNORMAL LOW (ref >60)
Glucose, Bld: 162 mg/dL — ABNORMAL HIGH (ref 70–99)
Potassium: 4.1 mmol/L (ref 3.5–5.1)
Sodium: 137 mmol/L (ref 135–145)

## 2022-04-15 LAB — GLUCOSE, BODY FLUID OTHER: Glucose, Body Fluid Other: 231 mg/dL

## 2022-04-15 LAB — HEMOGLOBIN A1C
Hgb A1c MFr Bld: 7.4 % — ABNORMAL HIGH (ref 4.8–5.6)
Mean Plasma Glucose: 166 mg/dL

## 2022-04-15 MED ORDER — METHYLPREDNISOLONE SODIUM SUCC 40 MG IJ SOLR
40.0000 mg | Freq: Every day | INTRAMUSCULAR | Status: DC
  Administered 2022-04-15 – 2022-04-17 (×3): 40 mg via INTRAVENOUS
  Filled 2022-04-15 (×3): qty 1

## 2022-04-15 NOTE — Progress Notes (Signed)
EKG done and given to nurse 

## 2022-04-15 NOTE — Evaluation (Signed)
Occupational Therapy Evaluation Patient Details Name: Traci Clark MRN: [id number] DOB: 04-19-1931 Today's Date: 04/15/2022   History of Present Illness 88 y.o.  F admitted on 04/13/22 due to AMS and difficulty weight bearing. Pt found to have severe gout in both ankles and R knee, as well as an acute kidney injury. PMH significant for hypertension, T2DM, GERD, gout and constipation.   Clinical Impression   Pt admitted for concerns listed above. PTA pt reported that she was independent with all ADL's and IADL's, however her grandson was around to assist with whatever she may need help with. At this time, pt is severely limited by pain  and requiring increased assist with all ADL's and functional mobility.  She is unable to bear weight on BLE at this time for more than a few seconds due to pain. Recommending SNF at this time to maximize her independence and safety. OT will follow acutely.       Recommendations for follow up therapy are one component of a multi-disciplinary discharge planning process, led by the attending physician.  Recommendations may be updated based on patient status, additional functional criteria and insurance authorization.   Follow Up Recommendations  Skilled nursing-short term rehab (<3 hours/day)     Assistance Recommended at Discharge Frequent or constant Supervision/Assistance  Patient can return home with the following A lot of help with walking and/or transfers;A lot of help with bathing/dressing/bathroom;Assistance with cooking/housework;Help with stairs or ramp for entrance    Functional Status Assessment  Patient has had a recent decline in their functional status and demonstrates the ability to make significant improvements in function in a reasonable and predictable amount of time.  Equipment Recommendations  None recommended by OT    Recommendations for Other Services       Precautions / Restrictions Precautions Precautions:  Fall Restrictions Weight Bearing Restrictions: No      Mobility Bed Mobility Overal bed mobility: Needs Assistance Bed Mobility: Supine to Sit, Sit to Supine     Supine to sit: Min guard Sit to supine: Min assist   General bed mobility comments: Min guard and increased time to push up to sitting, increased time and min A to bring BLE all the way into the bed to return to supine    Transfers Overall transfer level: Needs assistance Equipment used: None Transfers: Bed to chair/wheelchair/BSC   Stand pivot transfers: Mod assist         General transfer comment: Mod A to stand and pivot, most likely due to increasing pain with weight on feet      Balance Overall balance assessment: Needs assistance Sitting-balance support: Feet supported Sitting balance-Leahy Scale: Good     Standing balance support: Bilateral upper extremity supported Standing balance-Leahy Scale: Poor Standing balance comment: Reliant on B UE to assist with standing                           ADL either performed or assessed with clinical judgement   ADL Overall ADL's : Needs assistance/impaired Eating/Feeding: Set up;Sitting   Grooming: Set up;Sitting   Upper Body Bathing: Set up;Sitting   Lower Body Bathing: Moderate assistance;Sit to/from stand;Sitting/lateral leans   Upper Body Dressing : Set up;Sitting   Lower Body Dressing: Moderate assistance;Sit to/from stand;Sitting/lateral leans   Toilet Transfer: Moderate assistance;Stand-pivot   Toileting- Clothing Manipulation and Hygiene: Set up;Sitting/lateral lean       Functional mobility during ADLs: Moderate assistance General ADL Comments: Pt  limited due to high pain levels     Vision Baseline Vision/History: 0 No visual deficits Ability to See in Adequate Light: 0 Adequate Patient Visual Report: No change from baseline Vision Assessment?: No apparent visual deficits     Perception Perception Perception Tested?: No    Praxis Praxis Praxis tested?: Not tested    Pertinent Vitals/Pain Pain Assessment Pain Assessment: Faces Faces Pain Scale: Hurts whole lot Pain Location: feet, right knee Pain Descriptors / Indicators: Aching, Burning, Discomfort, Grimacing Pain Intervention(s): Limited activity within patient's tolerance, Monitored during session, Repositioned     Hand Dominance Right   Extremity/Trunk Assessment Upper Extremity Assessment Upper Extremity Assessment: Overall WFL for tasks assessed   Lower Extremity Assessment Lower Extremity Assessment: Defer to PT evaluation   Cervical / Trunk Assessment Cervical / Trunk Assessment: Kyphotic   Communication Communication Communication: No difficulties   Cognition Arousal/Alertness: Awake/alert Behavior During Therapy: WFL for tasks assessed/performed Overall Cognitive Status: Within Functional Limits for tasks assessed                                       General Comments  VSS on RA, R knee warm and swollen, Both ankles warm and swollen    Exercises     Shoulder Instructions      Home Living Family/patient expects to be discharged to:: Private residence Living Arrangements: Other relatives Available Help at Discharge: Family;Available 24 hours/day Type of Home: House Home Access: Level entry     Home Layout: One level     Bathroom Shower/Tub: Teacher, early years/pre: Standard Bathroom Accessibility: Yes How Accessible: Accessible via walker Home Equipment: Shower seat;Cane - single point;Rolling Walker (2 wheels);Rollator (4 wheels);Wheelchair - manual;Grab bars - tub/shower          Prior Functioning/Environment Prior Level of Function : Needs assist             Mobility Comments: Uses RW at baseline ADLs Comments: Needs assist with IADL's        OT Problem List: Decreased strength;Decreased activity tolerance;Impaired balance (sitting and/or standing);Decreased safety  awareness;Impaired sensation;Pain      OT Treatment/Interventions: Therapeutic exercise;Self-care/ADL training;Energy conservation;DME and/or AE instruction;Therapeutic activities;Patient/family education;Balance training    OT Goals(Current goals can be found in the care plan section) Acute Rehab OT Goals Patient Stated Goal: To relieve pain OT Goal Formulation: With patient Time For Goal Achievement: 04/29/22 Potential to Achieve Goals: Good ADL Goals Pt Will Perform Grooming: with modified independence;standing Pt Will Perform Lower Body Bathing: with modified independence;sitting/lateral leans;sit to/from stand Pt Will Perform Lower Body Dressing: with modified independence;sit to/from stand;sitting/lateral leans Pt Will Transfer to Toilet: with modified independence;ambulating Pt Will Perform Toileting - Clothing Manipulation and hygiene: with modified independence;sit to/from stand;sitting/lateral leans  OT Frequency: Min 1X/week    Co-evaluation              AM-PAC OT "6 Clicks" Daily Activity     Outcome Measure Help from another person eating meals?: A Little Help from another person taking care of personal grooming?: A Little Help from another person toileting, which includes using toliet, bedpan, or urinal?: A Lot Help from another person bathing (including washing, rinsing, drying)?: A Lot Help from another person to put on and taking off regular upper body clothing?: A Little Help from another person to put on and taking off regular lower body clothing?: A Lot 6  Click Score: 15   End of Session Nurse Communication: Mobility status  Activity Tolerance: Patient tolerated treatment well Patient left: in bed;with call bell/phone within reach;with family/visitor present  OT Visit Diagnosis: Unsteadiness on feet (R26.81);Other abnormalities of gait and mobility (R26.89);Muscle weakness (generalized) (M62.81)                Time: 7124-5809 OT Time Calculation (min):  27 min Charges:  OT General Charges $OT Visit: 1 Visit OT Evaluation $OT Eval Low Complexity: 1 Low OT Treatments $Self Care/Home Management : 8-22 mins  Alvita Fana Yolanda Bonine, OTR/L [place] 04/15/2022, 12:21 PM

## 2022-04-15 NOTE — TOC Progression Note (Addendum)
Transition of Care [facility]) - Progression Note    Patient Details  Name: Traci Clark MRN: [id number] Date of Birth: March 07, 1931  Transition of Care [facility]) CM/SW [place], [place] Phone Number: 04/15/2022, 1:34 PM  Clinical Narrative:    Pt has no bed offers at this time. CSW faxed referral to [facility] SNF this morning. CSW has attempted call back x2 unable to get through. CSW to continue calling at this time.   Addendum 11:30: CSW updated by admissions at [facility] that pts referral is still under review at this time. CSW requested to be updated as soon as there is a final decision.   Addendum 2:45: CSW spoke to admissions at [facility]. They are able to offer pt a bed and will start insurance auth. CSW spoke with pts grandson who states they would like to accept this bed offer. CSW to awaiting results of insurance auth. TOC to follow.   Expected Discharge Plan: [place] Barriers to Discharge: Continued Medical Work up  Expected Discharge Plan and Services Expected Discharge Plan: [place] In-house Referral: Clinical Social Work Discharge Planning Services: CM Consult Post Acute Care Choice: [place] arrangements for the past 2 months: Single Family Home                                       Social Determinants of Health (SDOH) Interventions    Readmission Risk Interventions     No data to display

## 2022-04-15 NOTE — Progress Notes (Signed)
Progress Note   Patient: Traci Clark ALP:[id number] DOB: Aug 31, 1930 DOA: 04/13/2022     1 DOS: the patient was seen and examined on 04/15/2022   Brief hospital course: Traci Clark  is a 88 y.o. female, with medical history significant for hypertension, T2DM, GERD, gout and constipation, as well as recent endoscopy significant for candidal esophagitis for which she has started on p.o. Diflucan  -Patient lives with her grandson, she was brought by family due to altered mental status, upon questioning he reports she has been confused over the last 4 to 5 days, upon my evaluation she is awake alert oriented x 4, it does appear she is more weak, fatigued than actually confused, she denies any focal deficits, she had a fall 4 days ago, she is not on any blood thinner, she has not been able to get up, but unable to bear weight due to generalized weakness, she is alternating between walker and a cane for ambulation due to severe arthritis, -In ED her workup was significant for elevated creatinine at 2.1, from baseline 1.1, lactic acid initially elevated at 2.3 which has resolved, with IV fluids, repeat 1.9, white blood cell count 10.5, is elevated at 304, COVID/flu are negative, right knee x-ray significant for status post arthrocentesis by ED physician.  [facility]  hospitalist consulted to admit.  Assessment and Plan: 1-altered mental status, weakness/fatigue and failure to thrive -All trigger most likely in the setting of dehydration with acute kidney injury -Renal function improved with fluid resuscitation but is still not back to baseline. -No surgical infection identified -CT head demonstrating no acute intracranial abnormalities. -Continue IV fluids and follow clinical response. -Patient mentation improved and essentially back to baseline according to family members.  2-dysphagia -In the setting of esophageal candidiasis -Continue treatment with Diflucan and PPI -Continue to advance diet as  tolerated  3-acute kidney injury on chronic kidney disease stage IIIb -Appears to be secondary to prerenal azotemia and dehydration -Continue IV fluid -Continue to avoid nephrotoxic agents, hypotension and the use of contrast -Follow renal function trend.  Creatinine trending down appropriately.  4-gastroesophageal for disease -Continue PPI.  5-type 2 diabetes -Continue sliding scale insulin and long-term insulin and adjusted dose -Follow CBGs fluctuation. -Hypertension -Overall vital signs stable; continue to follow vital signs. -At home not taking any medication for blood pressure.  6-history of gout/right knee effusion -Chronic issue for patient with previous arthrocentesis -No signs of infection -Most likely osteoarthritis versus gout -Continue analgesics and start treatment with steroids. -Aspercreme to be applied as needed every 8 hours provided -follow clinical response.  7-lactic acidosis -In the setting of dehydration -Resolved with fluid resuscitation.  Subjective:  Afebrile, no chest pain, no nausea, no vomiting.  Reports good tolerance to dysphagia 2 diet.  Complaining of right knee and bilateral feet pain preventive care to ambulate adequately.  She is weak and deconditioned.  Physical Exam: Vitals:   04/15/22 0446 04/15/22 0604 04/15/22 1437 04/15/22 1439  BP: (!) 149/68 (!) 146/67 (!) 163/74 (!) 157/67  Pulse: (!) 56 (!) 57 62 (!) 59  Resp: '14 14 16   '$ Temp: 99.5 F (37.5 C) 98.2 F (36.8 C) 98.8 F (37.1 C)   TempSrc: Oral Oral Oral   SpO2: 95% 98% 100%   Weight:      Height:       General exam: Alert, awake, oriented x 3; tolerating dysphagia 2 diet and expressing no significant odynophagia.  Patient denies nausea or vomiting.  And is currently  afebrile.  Complaining of bilateral feet and right knee pain. Respiratory system: Good saturation on room air; no using accessory muscle. Cardiovascular system:RRR. No rubs or gallops; no JVD. Gastrointestinal  system: Abdomen is nondistended, soft and nontender. No organomegaly or masses felt. Normal bowel sounds heard. Central nervous system: Alert and oriented. No focal neurological deficits. Extremities: No cyanosis or clubbing; right knee is slightly swollen and tender to palpation.  Patient also expressed bilateral feet pain no significant swelling or erythema present.  Decreased range of motion appreciated. Skin: No petechiae. Psychiatry: Judgement and insight appear normal. Mood & affect appropriate.   Data Reviewed: WBCs 9.9, hemoglobin 11.9, platelet count 257 K Basic metabolic panel: Sodium 505, potassium 4.1, chloride 104, bicarb 26, BUN 24, creatinine 1.4, GFR 36 and calcium 8.8.  Family Communication: Daughter and grandson at bedside  Disposition: Status is: Inpatient Remains inpatient appropriate because: Continue fluid resuscitation and follow renal function trend.  Evaluation by physical therapy recommending skilled medical history intubated   Planned Discharge Destination: Following recommendation by physical therapy and after discussing with family they would like to pursue short-term placement for rehabilitation.  Time spent: 35 minutes  Author: Barton Dubois, MD 04/15/2022 6:01 PM  For on call review [email or web address].

## 2022-04-15 NOTE — Progress Notes (Addendum)
Pt c/o chest pain. Describes as pressure, 7/10 pain scale. Non radiating.  NO: ECG; results messaged to on call MD.     04/15/22 0446  Vitals  Temp 99.5 F (37.5 C)  Temp Source Oral  BP (!) 149/68  MAP (mmHg) 91  BP Location Left Arm  BP Method Automatic  Patient Position (if appropriate) Lying  Pulse Rate (!) 56  Pulse Rate Source Monitor  Resp 14  Level of Consciousness  Level of Consciousness Alert  MEWS COLOR  MEWS Score Color Green  Oxygen Therapy  SpO2 95 %  O2 Device Room Air  Pain Assessment  Pain Scale 0-10  Pain Score 7  Pain Type Acute pain  Pain Location Chest  MEWS Score  MEWS Temp 0  MEWS Systolic 0  MEWS Pulse 0  MEWS RR 0  MEWS LOC 0  MEWS Score 0

## 2022-04-16 DIAGNOSIS — R404 Transient alteration of awareness: Secondary | ICD-10-CM | POA: Diagnosis not present

## 2022-04-16 DIAGNOSIS — K219 Gastro-esophageal reflux disease without esophagitis: Secondary | ICD-10-CM | POA: Diagnosis not present

## 2022-04-16 DIAGNOSIS — E1122 Type 2 diabetes mellitus with diabetic chronic kidney disease: Secondary | ICD-10-CM | POA: Diagnosis not present

## 2022-04-16 DIAGNOSIS — N179 Acute kidney failure, unspecified: Secondary | ICD-10-CM | POA: Diagnosis not present

## 2022-04-16 LAB — GLUCOSE, CAPILLARY
Glucose-Capillary: 281 mg/dL — ABNORMAL HIGH (ref 70–99)
Glucose-Capillary: 384 mg/dL — ABNORMAL HIGH (ref 70–99)
Glucose-Capillary: 219 mg/dL — ABNORMAL HIGH (ref 70–99)
Glucose-Capillary: 285 mg/dL — ABNORMAL HIGH (ref 70–99)

## 2022-04-16 MED ORDER — POLYETHYLENE GLYCOL 3350 17 G PO PACK
17.0000 g | PACK | Freq: Every day | ORAL | Status: DC
  Administered 2022-04-16 – 2022-04-17 (×2): 17 g via ORAL
  Filled 2022-04-16 (×2): qty 1

## 2022-04-16 MED ORDER — ONDANSETRON HCL 4 MG/2ML IJ SOLN
4.0000 mg | Freq: Four times a day (QID) | INTRAMUSCULAR | Status: DC | PRN
  Administered 2022-04-16: 4 mg via INTRAVENOUS
  Filled 2022-04-16: qty 2

## 2022-04-16 MED ORDER — ACETAMINOPHEN 500 MG PO TABS
1000.0000 mg | ORAL_TABLET | Freq: Three times a day (TID) | ORAL | Status: DC
  Administered 2022-04-16 – 2022-04-17 (×3): 1000 mg via ORAL
  Filled 2022-04-16 (×3): qty 2

## 2022-04-16 MED ORDER — INSULIN DETEMIR 100 UNIT/ML ~~LOC~~ SOLN
18.0000 [IU] | Freq: Every day | SUBCUTANEOUS | Status: DC
  Administered 2022-04-16: 18 [IU] via SUBCUTANEOUS
  Filled 2022-04-16 (×2): qty 0.18

## 2022-04-16 MED ORDER — TRAMADOL HCL 50 MG PO TABS
50.0000 mg | ORAL_TABLET | Freq: Four times a day (QID) | ORAL | Status: DC | PRN
  Administered 2022-04-16: 50 mg via ORAL
  Filled 2022-04-16: qty 1

## 2022-04-16 NOTE — Plan of Care (Signed)
  Problem: Education: Goal: Knowledge of General Education information will improve Description Including pain rating scale, medication(s)/side effects and non-pharmacologic comfort measures Outcome: Progressing   

## 2022-04-16 NOTE — Progress Notes (Addendum)
Progress Note   Patient: Traci Clark IDP:[id number] DOB: [birth year] DOA: 04/13/2022     2 DOS: the patient was seen and examined on 04/16/2022   Brief hospital course: Traci Clark  is a 88 y.o. female, with medical history significant for hypertension, T2DM, GERD, gout and constipation, as well as recent endoscopy significant for candidal esophagitis for which she has started on p.o. Diflucan  -Patient lives with her grandson, she was brought by family due to altered mental status, upon questioning he reports she has been confused over the last 4 to 5 days, upon my evaluation she is awake alert oriented x 4, it does appear she is more weak, fatigued than actually confused, she denies any focal deficits, she had a fall 4 days ago, she is not on any blood thinner, she has not been able to get up, but unable to bear weight due to generalized weakness, she is alternating between walker and a cane for ambulation due to severe arthritis, -In ED her workup was significant for elevated creatinine at 2.1, from baseline 1.1, lactic acid initially elevated at 2.3 which has resolved, with IV fluids, repeat 1.9, white blood cell count 10.5, is elevated at 304, COVID/flu are negative, right knee x-ray significant for status post arthrocentesis by ED physician.  [facility]  hospitalist consulted to admit.  Assessment and Plan: 1-altered mental status/metabolic encephalopathy, weakness/fatigue and failure to thrive -All trigger most likely in the setting of dehydration with acute kidney injury -Renal function improved with fluid resuscitation but is still not back to baseline. -No surgical infection identified -CT head demonstrating no acute intracranial abnormalities. -Continue IV fluids and follow clinical response. -Patient mentation improved and essentially back to baseline according to family members.  2-dysphagia -In the setting of esophageal candidiasis -Continue treatment with Diflucan and  PPI -Continue to advance diet as tolerated  3-acute kidney injury on chronic kidney disease stage IIIb -Appears to be secondary to prerenal azotemia and dehydration -Continue IV fluid -Continue to avoid nephrotoxic agents, hypotension and the use of contrast -Continue to follow renal function trend.  Creatinine trending down appropriately and pretty much back to baseline currently.  4-gastroesophageal for disease -Continue PPI.  5-type 2 diabetes -Continue sliding scale insulin and long-term insulin and adjusted dose -Follow CBGs fluctuation. -Hypertension -Overall vital signs stable; continue to follow vital signs. -At home not taking any medication for blood pressure.  6-history of gout/right knee effusion -Chronic issue for patient with previous arthrocentesis -No signs of infection -Most likely osteoarthritis versus gout -Continue Aspercreme for local analgesic therapy. -Schedule Tylenol, as needed tramadol and the use of steroids. -follow clinical response.  7-lactic acidosis -In the setting of dehydration -Resolved with fluid resuscitation.  8-constipation -MiraLAX has been started.  Subjective:  Weak and deconditioned; reported improvement in her joint pain.  Afebrile, no nausea, no vomiting.  On today's examination was sitting up while eating lunch.  Physical Exam: Vitals:   04/15/22 2136 04/16/22 0438 04/16/22 1356 04/16/22 1444  BP: (!) 169/67 (!) 174/72 (!) 181/79 (!) 176/58  Pulse: (!) 50 (!) 47 (!) 50 (!) 45  Resp: '14 16 14   '$ Temp: 98.2 F (36.8 C) 98 F (36.7 C) 98.5 F (36.9 C)   TempSrc: Oral Oral Oral   SpO2: 96% 97% 98%   Weight:      Height:       General exam: Alert, awake, oriented x 3; reports an improvement in her joint pain.  Feeling weak and deconditioned.  No  nausea, no vomiting, no shortness of breath. Respiratory system: Clear to auscultation. Respiratory effort normal.  No using accessory muscles.  Good saturation on room air  appreciated. Cardiovascular system:RRR. No rubs or gallops; no JVD. Gastrointestinal system: Abdomen is nondistended, soft and nontender. No organomegaly or masses felt. Normal bowel sounds heard. Central nervous system: Alert and oriented. No focal neurological deficits. Extremities: No cyanosis or clubbing; decreased range of motion due to pain in her right knee and bilateral feet. Skin: No petechiae. Psychiatry: Judgement and insight appear normal. Mood & affect appropriate.   Data Reviewed: WBCs 9.9, hemoglobin 11.9, platelet count 794 K Basic metabolic panel: Sodium 801, potassium 4.1, chloride 104, bicarb 26, BUN 24, creatinine 1.4, GFR 36 and calcium 8.8.  Family Communication: Daughter at bedside; grandson (healthcare power of attorney) over the phone.  Disposition: Status is: Inpatient Remains inpatient appropriate because: Continue fluid resuscitation and follow renal function trend.  Evaluation by physical therapy recommending skilled medical history intubated   Planned Discharge Destination: Following recommendation by physical therapy and after discussing with family they would like to pursue short-term placement for rehabilitation.  Time spent: 35 minutes  Author: Barton Dubois, MD 04/16/2022 5:50 PM  For on call review [email or web address].

## 2022-04-16 NOTE — Inpatient Diabetes Management (Signed)
Inpatient Diabetes Program Recommendations  AACE/ADA: New Consensus Statement on Inpatient Glycemic Control   Target Ranges:  Prepandial:   less than 140 mg/dL      Peak postprandial:   less than 180 mg/dL (1-2 hours)      Critically ill patients:  140 - 180 mg/dL    Latest Reference Range & Units 04/15/22 05:12 04/15/22 08:06 04/15/22 11:48 04/15/22 17:20 04/15/22 20:58 04/16/22 07:39  Glucose-Capillary 70 - 99 mg/dL 155 (H) 146 (H) 202 (H) 134 (H) 298 (H) 281 (H)    Review of Glycemic Control  Diabetes history: DM2 Outpatient Diabetes medications: Basaglar 14 units QHS, Novolog 4-10 units TID with meals Current orders for Inpatient glycemic control: Levemir 14 units QHS, Novolog 0-9 units TID with meals, Novolog 0-5 units QHS; Solumedrol 40 mg daily  Inpatient Diabetes Program Recommendations:    Insulin: If steroids are continued as ordered, please consider increasing Levemir to 17 units QHS and ordering Novolog 2 units TID with meals for meal coverage if patient eats at least 50% of meals.  Thanks, Barnie Alderman, RN, MSN, [place] Diabetes Coordinator Inpatient Diabetes Program [phone] (Team Pager from 8am to [place])

## 2022-04-16 NOTE — TOC Progression Note (Addendum)
Transition of Care [facility]) - Progression Note    Patient Details  Name: Traci Clark MRN: [id number] Date of Birth: Nov 22, 1930  Transition of Care [facility]) CM/SW [place], [place] Phone Number: 04/16/2022, 11:01 AM  Clinical Narrative:    CSW reached out to [facility] admissions for update on insurance auth. CSW left HIPAA compliant VM requesting update when able. TOC to follow.   Addendum 1:25pm: CSW updated by admissions with [facility] that pts insurance has requested a peer to peer be completed. CSW updated MD of this and provided info. Pts pending Josem Kaufmann ID is [id number]. TOC to follow.   Expected Discharge Plan: [place] Barriers to Discharge: Continued Medical Work up  Expected Discharge Plan and Services Expected Discharge Plan: [place] In-house Referral: Clinical Social Work Discharge Planning Services: CM Consult Post Acute Care Choice: [place] arrangements for the past 2 months: Single Family Home                                       Social Determinants of Health (SDOH) Interventions    Readmission Risk Interventions     No data to display

## 2022-04-17 DIAGNOSIS — R404 Transient alteration of awareness: Secondary | ICD-10-CM | POA: Diagnosis not present

## 2022-04-17 LAB — GLUCOSE, CAPILLARY
Glucose-Capillary: 233 mg/dL — ABNORMAL HIGH (ref 70–99)
Glucose-Capillary: 219 mg/dL — ABNORMAL HIGH (ref 70–99)

## 2022-04-17 LAB — BASIC METABOLIC PANEL
Anion gap: 8 (ref 5–15)
BUN: 23 mg/dL (ref 8–23)
CO2: 23 mmol/L (ref 22–32)
Calcium: 9.3 mg/dL (ref 8.9–10.3)
Chloride: 106 mmol/L (ref 98–111)
Creatinine, Ser: 1.14 mg/dL — ABNORMAL HIGH (ref 0.44–1.00)
GFR, Estimated: 46 mL/min — ABNORMAL LOW (ref >60)
Glucose, Bld: 270 mg/dL — ABNORMAL HIGH (ref 70–99)
Potassium: 4 mmol/L (ref 3.5–5.1)
Sodium: 137 mmol/L (ref 135–145)

## 2022-04-17 LAB — BODY FLUID CULTURE W GRAM STAIN: Culture: NO GROWTH

## 2022-04-17 MED ORDER — ASPIRIN 81 MG PO TBEC: 81.0000 mg | DELAYED_RELEASE_TABLET | Freq: Every day | ORAL | 12 refills | Status: DC

## 2022-04-17 MED ORDER — INSULIN ASPART 100 UNIT/ML IJ SOLN: 0.0000 [IU] | Freq: Every day | INTRAMUSCULAR | Status: DC

## 2022-04-17 MED ORDER — HYDRALAZINE HCL 20 MG/ML IJ SOLN: 10.0000 mg | INTRAMUSCULAR | Status: DC | PRN

## 2022-04-17 MED ORDER — FLUCONAZOLE 150 MG PO TABS: 150.0000 mg | ORAL_TABLET | Freq: Every day | ORAL | 0 refills | Status: DC

## 2022-04-17 MED ORDER — ALPRAZOLAM 0.25 MG PO TABS: 0.2500 mg | ORAL_TABLET | Freq: Two times a day (BID) | ORAL | 0 refills | Status: DC | PRN

## 2022-04-17 MED ORDER — TRAMADOL HCL 50 MG PO TABS: 50.0000 mg | ORAL_TABLET | Freq: Four times a day (QID) | ORAL | 0 refills | Status: AC | PRN

## 2022-04-17 MED ORDER — INSULIN ASPART 100 UNIT/ML IJ SOLN
0.0000 [IU] | Freq: Three times a day (TID) | INTRAMUSCULAR | Status: DC
  Administered 2022-04-17 (×2): 5 [IU] via SUBCUTANEOUS

## 2022-04-17 MED ORDER — INSULIN ASPART 100 UNIT/ML IJ SOLN
2.0000 [IU] | Freq: Three times a day (TID) | INTRAMUSCULAR | Status: DC
  Administered 2022-04-17 (×2): 2 [IU] via SUBCUTANEOUS

## 2022-04-17 MED ORDER — PREDNISONE 10 MG PO TABS: ORAL_TABLET | ORAL | 0 refills | Status: AC

## 2022-04-17 NOTE — Progress Notes (Signed)
Patient discharged to [facility], patient transported to SNF by family. Discharge paperwork went over with family and placed in discharge packet to give to receiving facility. Belongings sent with patient.

## 2022-04-17 NOTE — Care Management Important Message (Signed)
Important Message  Patient Details  Name: Traci Clark MRN: [id number] Date of Birth: 07-Mar-1931   Medicare Important Message Given:  Yes     [name] 04/17/2022, 11:30 AM

## 2022-04-17 NOTE — Progress Notes (Signed)
Tele called, pt's HR running in 40s. Dr. Josephine Cables notified, he stated pt's HR has been running in the high 40s >24 hrs. Pt is asymptomatic at this time and resting. NNO , just monitor.

## 2022-04-17 NOTE — Discharge Summary (Signed)
Physician Discharge Summary  Keishawna Carranza Edgren BBC:[id number] DOB: 02-Nov-1930 DOA: 04/13/2022  PCP: Lavella Lemons, PA  Admit date: 04/13/2022  Discharge date: 04/17/2022  Admitted From:Home  Disposition:  SNF  Recommendations for Outpatient Follow-up:  Follow up with PCP in 1-2 weeks Continue on Diflucan and PPI as prescribed for esophageal candidiasis Continue on prednisone taper for right knee gout flare and continue on tramadol as needed for pain control Continue other home medications as prior  Home Health: None  Equipment/Devices: None  Discharge Condition:Stable  CODE STATUS: DNR  Diet recommendation: Dys 3  Brief/Interim Summary: Reshunda Strider  is a 88 y.o. female, with medical history significant for hypertension, T2DM, GERD, gout and constipation, as well as recent endoscopy significant for candidal esophagitis for which she has started on p.o. Diflucan  -Patient lives with her grandson, she was brought by family due to altered mental status, upon questioning he reports she has been confused over the last 4 to 5 days, upon my evaluation she is awake alert oriented x 4, it does appear she is more weak, fatigued than actually confused, she denies any focal deficits, she had a fall 4 days ago, she is not on any blood thinner, she has not been able to get up, but unable to bear weight due to generalized weakness, she is alternating between walker and a cane for ambulation due to severe arthritis, -In ED her workup was significant for elevated creatinine at 2.1, from baseline 1.1, lactic acid initially elevated at 2.3 which has resolved, with IV fluids, repeat 1.9, white blood cell count 10.5, is elevated at 304, COVID/flu are negative, right knee x-ray significant for status post arthrocentesis by ED physician.  [facility]  hospitalist consulted to admit.  Patient was admitted for acute metabolic encephalopathy along with weakness/fatigue and failure to thrive likely in the setting  of dehydration with AKI.  She was diagnosed with AKI on CKD stage IIIb and has returned to her baseline creatinine level after IV fluid hydration.  She was also noted to have arthrocentesis of her right knee demonstrating presence of inflammation likely due to gout and has been started on steroids with improvement in her condition.  No growth on cultures or Gram stain noted.  She has been seen by PT with recommendation for SNF on discharge given difficulty ambulating with her knee pain and she will discharge to SNF today.  No other acute events or concerns noted this admission.  Discharge Diagnoses:  Principal Problem:   AMS (altered mental status) Active Problems:   GERD (gastroesophageal reflux disease)   Diabetes (HCC)   Essential hypertension   Acute kidney injury superimposed on CKD (HCC)   Hyperglycemia due to diabetes mellitus ([place])   AKI (acute kidney injury) ([place])  Principal discharge diagnosis: Acute metabolic encephalopathy secondary to AKI on CKD stage IIIb.  Right knee effusion due to acute gout flare.  Discharge Instructions  Discharge Instructions     Diet - low sodium heart healthy   Complete by: As directed    Increase activity slowly   Complete by: As directed       Allergies as of 04/17/2022       Reactions   Amoxil [amoxicillin] Anaphylaxis, Rash   "Skin peeled off"   Macrobid [nitrofurantoin Monohyd Macro] Anaphylaxis, Rash, Other (See Comments)   "Skin peeled off"   Pravachol [pravastatin] Other (See Comments)   MYALGIAS  MYALGIAS  MYALGIAS  MYALGIAS   Codeine Nausea And Vomiting, Other (See Comments)  shaking   Darvon [propoxyphene] Nausea And Vomiting, Other (See Comments)   shaking   Mirtazapine Other (See Comments)   Does not tolerate   Neurontin [gabapentin] Nausea Only   Feel faint   Phenergan [promethazine] Other (See Comments)   altered mental changes   Rocephin [ceftriaxone] Other (See Comments)   Yeast infection    Hydrocodone-acetaminophen Nausea And Vomiting, Other (See Comments)   Causes chest to feel heavy         Medication List     TAKE these medications    albuterol 108 (90 Base) MCG/ACT inhaler Commonly known as: VENTOLIN HFA Inhale 1-2 puffs into the lungs every 6 (six) hours as needed for shortness of breath or wheezing. prn   allopurinol 100 MG tablet Commonly known as: ZYLOPRIM Take 100 mg by mouth in the morning.   ALPRAZolam 0.25 MG tablet Commonly known as: XANAX Take 1 tablet (0.25 mg total) by mouth 2 (two) times daily as needed for anxiety. What changed:  medication strength how much to take when to take this reasons to take this   aspirin EC 81 MG tablet Take 1 tablet (81 mg total) by mouth daily. Swallow whole. Start taking on: April 18, 2022   [facility] Inject 14 Units into the skin at bedtime.   dexlansoprazole 60 MG capsule Commonly known as: DEXILANT Take 60 mg by mouth daily before breakfast.   fluconazole 150 MG tablet Commonly known as: DIFLUCAN Take 1 tablet (150 mg total) by mouth daily for 21 days. Take 2 tablets the first day, then take one daily   fluticasone 50 MCG/ACT nasal spray Commonly known as: FLONASE Place 2 sprays into both nostrils daily as needed (allergies (cough/sneezing)).   furosemide 20 MG tablet Commonly known as: LASIX Take 20 mg by mouth in the morning.   latanoprost 0.005 % ophthalmic solution Commonly known as: XALATAN Place 1 drop into both eyes at bedtime.   meclizine 25 MG tablet Commonly known as: ANTIVERT Take 1 tablet (25 mg total) by mouth 3 (three) times daily as needed for dizziness.   NovoLOG FlexPen 100 UNIT/ML FlexPen Generic drug: insulin aspart Inject 4-10 Units into the skin 3 (three) times daily with meals. Per sliding scale   ondansetron 4 MG tablet Commonly known as: ZOFRAN Take 4 mg by mouth every 6 (six) hours as needed for nausea or vomiting.   polyethylene glycol 17 g  packet Commonly known as: MIRALAX / GLYCOLAX Take 34 g by mouth in the morning.   potassium chloride 10 MEQ CR capsule Commonly known as: MICRO-K Take 10 mEq by mouth in the morning.   predniSONE 10 MG tablet Commonly known as: DELTASONE Take 4 tablets (40 mg total) by mouth daily for 2 days, THEN 3 tablets (30 mg total) daily for 2 days, THEN 2 tablets (20 mg total) daily for 2 days, THEN 1 tablet (10 mg total) daily for 2 days. Start taking on: April 17, 2022   traMADol 50 MG tablet Commonly known as: ULTRAM Take 1 tablet (50 mg total) by mouth every 6 (six) hours as needed for severe pain. What changed:  how much to take when to take this reasons to take this   triamcinolone cream 0.1 % Commonly known as: KENALOG Apply 1 Application topically 2 (two) times daily.   TYLENOL 500 MG tablet Generic drug: acetaminophen Take 1,000 mg by mouth every 6 (six) hours as needed (pain.). GELCAPS        Follow-up Information  Lavella Lemons, PA. Schedule an appointment as soon as possible for a visit in 1 week(s).   Specialty: Physician Assistant Contact information: [place] [address] [phone]                Allergies  Allergen Reactions   Amoxil [Amoxicillin] Anaphylaxis and Rash    "Skin peeled off"   Macrobid [Nitrofurantoin Monohyd Macro] Anaphylaxis, Rash and Other (See Comments)    "Skin peeled off"   Pravachol [Pravastatin] Other (See Comments)    MYALGIAS  MYALGIAS  MYALGIAS  MYALGIAS   Codeine Nausea And Vomiting and Other (See Comments)    shaking   Darvon [Propoxyphene] Nausea And Vomiting and Other (See Comments)    shaking   Mirtazapine Other (See Comments)    Does not tolerate   Neurontin [Gabapentin] Nausea Only    Feel faint   Phenergan [Promethazine] Other (See Comments)    altered mental changes   Rocephin [Ceftriaxone] Other (See Comments)    Yeast infection   Hydrocodone-Acetaminophen Nausea And Vomiting  and Other (See Comments)    Causes chest to feel heavy      Consultations: None   Procedures/Studies: DG Knee 2 Views Right  Result Date: 04/13/2022 CLINICAL DATA:  Knee pain. EXAM: RIGHT KNEE - 1-2 VIEW COMPARISON:  None Available. FINDINGS: Moderate to large suprapatellar joint effusion identified. No signs of acute fracture or dislocation. There is tricompartment osteoarthritis, severe within the lateral compartment with joint space narrowing, subchondral sclerosis and marginal spur formation. IMPRESSION: 1. Moderate to large suprapatellar joint effusion. 2. Tricompartment osteoarthritis, severe within the lateral compartment. Electronically Signed   By: Kerby Moors M.D.   On: 04/13/2022 13:45   DG Chest Port 1 View  Result Date: 04/13/2022 CLINICAL DATA:  Sepsis.  Evaluate for abnormality. EXAM: PORTABLE CHEST 1 VIEW COMPARISON:  09/11/2020. FINDINGS: Normal cardiomediastinal contours. No pleural effusion or edema. No airspace opacities identified. The visualized osseous structures are unremarkable. IMPRESSION: No active disease. Electronically Signed   By: Kerby Moors M.D.   On: 04/13/2022 13:43   CT Head Wo Contrast  Result Date: 04/13/2022 CLINICAL DATA:  88 year old female with head and neck injury. EXAM: CT HEAD WITHOUT CONTRAST CT CERVICAL SPINE WITHOUT CONTRAST TECHNIQUE: Multidetector CT imaging of the head and cervical spine was performed following the standard protocol without intravenous contrast. Multiplanar CT image reconstructions of the cervical spine were also generated. RADIATION DOSE REDUCTION: This exam was performed according to the departmental dose-optimization program which includes automated exposure control, adjustment of the mA and/or kV according to patient size and/or use of iterative reconstruction technique. COMPARISON:  09/24/2017 neck CT and 10/16/2005 head CT. Other studies are not available for comparison. FINDINGS: CT HEAD FINDINGS Brain: An age  indeterminate LEFT thalamic infarct is noted, more likely chronic. No other infarct noted. There is no evidence of hemorrhage, hydrocephalus, extra-axial collection, mass lesion/mass effect or midline shift. Atrophy and chronic small-vessel white matter ischemic changes again noted. Vascular: Carotid and vertebral atherosclerotic calcifications are noted. Skull: Normal. Negative for fracture or focal lesion. Sinuses/Orbits: No acute finding. Other: None. CT CERVICAL SPINE FINDINGS Alignment: 2 mm spondylolisthesis at C4-5, C5-6 and C6-7 is unchanged. No acute subluxation identified. Skull base and vertebrae: No acute fracture. No primary bone lesion or focal pathologic process. Soft tissues and spinal canal: No prevertebral fluid or swelling. No visible canal hematoma. Disc levels: Multilevel degenerative disc disease, spondylosis and facet arthropathy again noted contributing to mild central spinal  and foraminal narrowing at multiple levels. Upper chest: No acute abnormality Other: None IMPRESSION: 1. Age indeterminate LEFT thalamic infarct, more likely chronic. No other evidence of acute intracranial abnormality. Atrophy and chronic small-vessel white matter ischemic changes. 2. No evidence of acute injury to the cervical spine. Multilevel degenerative changes within the cervical spine. Electronically Signed   By: Margarette Canada M.D.   On: 04/13/2022 13:41   CT Cervical Spine Wo Contrast  Result Date: 04/13/2022 CLINICAL DATA:  88 year old female with head and neck injury. EXAM: CT HEAD WITHOUT CONTRAST CT CERVICAL SPINE WITHOUT CONTRAST TECHNIQUE: Multidetector CT imaging of the head and cervical spine was performed following the standard protocol without intravenous contrast. Multiplanar CT image reconstructions of the cervical spine were also generated. RADIATION DOSE REDUCTION: This exam was performed according to the departmental dose-optimization program which includes automated exposure control,  adjustment of the mA and/or kV according to patient size and/or use of iterative reconstruction technique. COMPARISON:  09/24/2017 neck CT and 10/16/2005 head CT. Other studies are not available for comparison. FINDINGS: CT HEAD FINDINGS Brain: An age indeterminate LEFT thalamic infarct is noted, more likely chronic. No other infarct noted. There is no evidence of hemorrhage, hydrocephalus, extra-axial collection, mass lesion/mass effect or midline shift. Atrophy and chronic small-vessel white matter ischemic changes again noted. Vascular: Carotid and vertebral atherosclerotic calcifications are noted. Skull: Normal. Negative for fracture or focal lesion. Sinuses/Orbits: No acute finding. Other: None. CT CERVICAL SPINE FINDINGS Alignment: 2 mm spondylolisthesis at C4-5, C5-6 and C6-7 is unchanged. No acute subluxation identified. Skull base and vertebrae: No acute fracture. No primary bone lesion or focal pathologic process. Soft tissues and spinal canal: No prevertebral fluid or swelling. No visible canal hematoma. Disc levels: Multilevel degenerative disc disease, spondylosis and facet arthropathy again noted contributing to mild central spinal and foraminal narrowing at multiple levels. Upper chest: No acute abnormality Other: None IMPRESSION: 1. Age indeterminate LEFT thalamic infarct, more likely chronic. No other evidence of acute intracranial abnormality. Atrophy and chronic small-vessel white matter ischemic changes. 2. No evidence of acute injury to the cervical spine. Multilevel degenerative changes within the cervical spine. Electronically Signed   By: Margarette Canada M.D.   On: 04/13/2022 13:41     Discharge Exam: Vitals:   04/17/22 0437 04/17/22 0837  BP: (!) 193/79 (!) 142/76  Pulse: (!) 44 (!) 56  Resp: 19   Temp: 98.5 F (36.9 C)   SpO2: 98%    Vitals:   04/16/22 1444 04/16/22 2020 04/17/22 0437 04/17/22 0837  BP: (!) 176/58 (!) 182/77 (!) 193/79 (!) 142/76  Pulse: (!) 45 (!) 49 (!) 44  (!) 56  Resp:  20 19   Temp:  97.7 F (36.5 C) 98.5 F (36.9 C)   TempSrc:  Oral    SpO2:  95% 98%   Weight:      Height:        General: Pt is alert, awake, not in acute distress Cardiovascular: RRR, S1/S2 +, no rubs, no gallops Respiratory: CTA bilaterally, no wheezing, no rhonchi Abdominal: Soft, NT, ND, bowel sounds + Extremities: no edema, no cyanosis, right knee effusion    The results of significant diagnostics from this hospitalization (including imaging, microbiology, ancillary and laboratory) are listed below for reference.     Microbiology: Recent Results (from the past 240 hour(s))  Blood Culture (routine x 2)     Status: None (Preliminary result)   Collection Time: 04/13/22 12:41 PM   Specimen: BLOOD LEFT  HAND  Result Value Ref Range Status   Specimen Description   Final    BLOOD LEFT HAND BOTTLES DRAWN AEROBIC AND ANAEROBIC   Special Requests Blood Culture adequate volume  Final   Culture   Final    NO GROWTH 2 DAYS Performed at [facility], [address], [place], [place] [address]    Report Status PENDING  Incomplete  Blood Culture (routine x 2)     Status: None (Preliminary result)   Collection Time: 04/13/22 12:50 PM   Specimen: Right Antecubital; Blood  Result Value Ref Range Status   Specimen Description   Final    RIGHT ANTECUBITAL BOTTLES DRAWN AEROBIC AND ANAEROBIC   Special Requests Blood Culture adequate volume  Final   Culture   Final    NO GROWTH 2 DAYS Performed at [facility], [address] [place], [place] [address]    Report Status PENDING  Incomplete  Urine Culture     Status: None   Collection Time: 04/13/22  1:29 PM   Specimen: Urine, Catheterized  Result Value Ref Range Status   Specimen Description   Final    URINE, CATHETERIZED Performed at [facility], [address], [place], [place] [address]    Special Requests   Final    NONE Performed at [facility], [address] [place], [place] [address]    Culture   Final     NO GROWTH Performed at [facility], [place] [address], [place], [place] [address]    Report Status 04/15/2022 FINAL  Final  Body fluid culture w Gram Stain     Status: None (Preliminary result)   Collection Time: 04/13/22  3:38 PM   Specimen: KNEE; Body Fluid  Result Value Ref Range Status   Specimen Description   Final    KNEE RIGHT Performed at [facility], [address] [place], [place] [address]    Special Requests   Final    NONE Performed at [facility], [address] [place], [place] [address]    Gram Stain   Final    CYTOSPIN SMEAR NO ORGANISMS SEEN WBC PRESENT, PREDOMINANTLY PMN Performed at [facility], [address], [place], [place] [address]    Culture   Final    NO GROWTH 2 DAYS Performed at [facility], [place] [address] [place], [place] [address]    Report Status PENDING  Incomplete  Resp Panel by RT-PCR (Flu A&B, Covid) Anterior Nasal Swab     Status: None   Collection Time: 04/13/22  3:59 PM   Specimen: Anterior Nasal Swab  Result Value Ref Range Status   SARS Coronavirus 2 by RT PCR NEGATIVE NEGATIVE Final    Comment: (NOTE) SARS-CoV-2 target nucleic acids are NOT DETECTED.  The SARS-CoV-2 RNA is generally detectable in upper respiratory specimens during the acute phase of infection. The lowest concentration of SARS-CoV-2 viral copies this assay can detect is 138 copies/mL. A negative result does not preclude SARS-Cov-2 infection and should not be used as the sole basis for treatment or other patient management decisions. A negative result may occur with  improper specimen collection/handling, submission of specimen other than nasopharyngeal swab, presence of viral mutation(s) within the areas targeted by this assay, and inadequate number of viral copies(<138 copies/mL). A negative result must be combined with clinical observations, patient history, and epidemiological information. The expected result is Negative.  Fact Sheet for  Patients:  EntrepreneurPulse.com.au  Fact Sheet for Healthcare Providers:  IncredibleEmployment.be  This test is no t yet approved or cleared by the [place] and  has been authorized for detection and/or diagnosis of SARS-CoV-2 by FDA under an Emergency Use Authorization (EUA). This EUA will remain  in effect (meaning this test can be used) for the duration of the COVID-19 declaration under Section 564(b)(1) of the Act, 21 U.S.C.section 360bbb-3(b)(1), unless the authorization is terminated  or revoked sooner.       Influenza A by PCR NEGATIVE NEGATIVE Final   Influenza B by PCR NEGATIVE NEGATIVE Final    Comment: (NOTE) The Xpert Xpress SARS-CoV-2/FLU/RSV plus assay is intended as an aid in the diagnosis of influenza from Nasopharyngeal swab specimens and should not be used as a sole basis for treatment. Nasal washings and aspirates are unacceptable for Xpert Xpress SARS-CoV-2/FLU/RSV testing.  Fact Sheet for Patients: EntrepreneurPulse.com.au  Fact Sheet for Healthcare Providers: IncredibleEmployment.be  This test is not yet approved or cleared by the [place] FDA and has been authorized for detection and/or diagnosis of SARS-CoV-2 by FDA under an Emergency Use Authorization (EUA). This EUA will remain in effect (meaning this test can be used) for the duration of the COVID-19 declaration under Section 564(b)(1) of the Act, 21 U.S.C. section 360bbb-3(b)(1), unless the authorization is terminated or revoked.  Performed at [facility], [address], [place], [place] [address]      Labs: BNP (last 3 results) No results for input(s): "BNP" in the last 8760 hours. Basic Metabolic Panel: Recent Labs  Lab 04/13/22 1250 04/13/22 1318 04/14/22 0411 04/15/22 0434 04/17/22 0405  NA 134* 134* 135 137 137  K 4.8 4.8 4.4 4.1 4.0  CL 95* 96* 98 104 106  CO2 29  --  '28 26 23  '$ GLUCOSE 304*  311* 180* 162* 270*  BUN 30* 29* 25* 24* 23  CREATININE 2.05* 2.10* 1.63* 1.40* 1.14*  CALCIUM 9.6  --  9.1 8.8* 9.3  MG 1.8  --   --   --   --    Liver Function Tests: Recent Labs  Lab 04/13/22 1250  AST 30  ALT 15  ALKPHOS 94  BILITOT 2.1*  PROT 8.1  ALBUMIN 3.6   No results for input(s): "LIPASE", "AMYLASE" in the last 168 hours. No results for input(s): "AMMONIA" in the last 168 hours. CBC: Recent Labs  Lab 04/13/22 1250 04/13/22 1318 04/14/22 0411  WBC 10.5  --  9.9  NEUTROABS 7.7  --   --   HGB 13.4 14.6 11.9*  HCT 40.1 43.0 36.1  MCV 89.1  --  90.0  PLT 178  --  177   Cardiac Enzymes: No results for input(s): "CKTOTAL", "CKMB", "CKMBINDEX", "TROPONINI" in the last 168 hours. BNP: Invalid input(s): "POCBNP" CBG: Recent Labs  Lab 04/16/22 0739 04/16/22 1146 04/16/22 1648 04/16/22 2024 04/17/22 0756  GLUCAP 281* 384* 219* 285* 233*   D-Dimer No results for input(s): "DDIMER" in the last 72 hours. Hgb A1c No results for input(s): "HGBA1C" in the last 72 hours. Lipid Profile No results for input(s): "CHOL", "HDL", "LDLCALC", "TRIG", "CHOLHDL", "LDLDIRECT" in the last 72 hours. Thyroid function studies No results for input(s): "TSH", "T4TOTAL", "T3FREE", "THYROIDAB" in the last 72 hours.  Invalid input(s): "FREET3" Anemia work up No results for input(s): "VITAMINB12", "FOLATE", "FERRITIN", "TIBC", "IRON", "RETICCTPCT" in the last 72 hours. Urinalysis    Component Value Date/Time   COLORURINE YELLOW 04/13/2022 1329   APPEARANCEUR CLEAR 04/13/2022 1329   LABSPEC 1.013 04/13/2022 1329   PHURINE  5.0 04/13/2022 1329   GLUCOSEU NEGATIVE 04/13/2022 1329   HGBUR NEGATIVE 04/13/2022 1329   BILIRUBINUR NEGATIVE 04/13/2022 1329   KETONESUR NEGATIVE 04/13/2022 1329   PROTEINUR NEGATIVE 04/13/2022 1329   UROBILINOGEN 0.2 06/27/2014 1044   NITRITE NEGATIVE 04/13/2022 1329   LEUKOCYTESUR NEGATIVE 04/13/2022 1329   Sepsis Labs Recent Labs  Lab  04/13/22 1250 04/14/22 0411  WBC 10.5 9.9   Microbiology Recent Results (from the past 240 hour(s))  Blood Culture (routine x 2)     Status: None (Preliminary result)   Collection Time: 04/13/22 12:41 PM   Specimen: BLOOD LEFT HAND  Result Value Ref Range Status   Specimen Description   Final    BLOOD LEFT HAND BOTTLES DRAWN AEROBIC AND ANAEROBIC   Special Requests Blood Culture adequate volume  Final   Culture   Final    NO GROWTH 2 DAYS Performed at [facility], [address], [place], [place] [address]    Report Status PENDING  Incomplete  Blood Culture (routine x 2)     Status: None (Preliminary result)   Collection Time: 04/13/22 12:50 PM   Specimen: Right Antecubital; Blood  Result Value Ref Range Status   Specimen Description   Final    RIGHT ANTECUBITAL BOTTLES DRAWN AEROBIC AND ANAEROBIC   Special Requests Blood Culture adequate volume  Final   Culture   Final    NO GROWTH 2 DAYS Performed at [facility], [address], [place], [place] [address]    Report Status PENDING  Incomplete  Urine Culture     Status: None   Collection Time: 04/13/22  1:29 PM   Specimen: Urine, Catheterized  Result Value Ref Range Status   Specimen Description   Final    URINE, CATHETERIZED Performed at [facility], [address] [place], [place] [address]    Special Requests   Final    NONE Performed at [facility], [address], [place], [place] [address]    Culture   Final    NO GROWTH Performed at [facility], [place] [address] [place], [place] [address]    Report Status 04/15/2022 FINAL  Final  Body fluid culture w Gram Stain     Status: None (Preliminary result)   Collection Time: 04/13/22  3:38 PM   Specimen: KNEE; Body Fluid  Result Value Ref Range Status   Specimen Description   Final    KNEE RIGHT Performed at [facility], [address], [place], [place] [address]    Special Requests   Final    NONE Performed at [facility], [address]  [place], [place] [address]    Gram Stain   Final    CYTOSPIN SMEAR NO ORGANISMS SEEN WBC PRESENT, PREDOMINANTLY PMN Performed at [facility], [address] [place],  [address]    Culture   Final    NO GROWTH 2 DAYS Performed at [facility], [place] [address] [place],  [address]    Report Status PENDING  Incomplete  Resp Panel by RT-PCR (Flu A&B, Covid) Anterior Nasal Swab     Status: None   Collection Time: 04/13/22  3:59 PM   Specimen: Anterior Nasal Swab  Result Value Ref Range Status   SARS Coronavirus 2 by RT PCR NEGATIVE NEGATIVE Final    Comment: (NOTE) SARS-CoV-2 target nucleic acids are NOT DETECTED.  The SARS-CoV-2 RNA is generally detectable in upper respiratory specimens during the acute phase of infection. The lowest concentration of  SARS-CoV-2 viral copies this assay can detect is 138 copies/mL. A negative result does not preclude SARS-Cov-2 infection and should not be used as the sole basis for treatment or other patient management decisions. A negative result may occur with  improper specimen collection/handling, submission of specimen other than nasopharyngeal swab, presence of viral mutation(s) within the areas targeted by this assay, and inadequate number of viral copies(<138 copies/mL). A negative result must be combined with clinical observations, patient history, and epidemiological information. The expected result is Negative.  Fact Sheet for Patients:  EntrepreneurPulse.com.au  Fact Sheet for Healthcare Providers:  IncredibleEmployment.be  This test is no t yet approved or cleared by the [place] FDA and  has been authorized for detection and/or diagnosis of SARS-CoV-2 by FDA under an Emergency Use Authorization (EUA). This EUA will remain  in effect (meaning this test can be used) for the duration of the COVID-19 declaration under Section 564(b)(1) of the Act, 21 U.S.C.section 360bbb-3(b)(1),  unless the authorization is terminated  or revoked sooner.       Influenza A by PCR NEGATIVE NEGATIVE Final   Influenza B by PCR NEGATIVE NEGATIVE Final    Comment: (NOTE) The Xpert Xpress SARS-CoV-2/FLU/RSV plus assay is intended as an aid in the diagnosis of influenza from Nasopharyngeal swab specimens and should not be used as a sole basis for treatment. Nasal washings and aspirates are unacceptable for Xpert Xpress SARS-CoV-2/FLU/RSV testing.  Fact Sheet for Patients: EntrepreneurPulse.com.au  Fact Sheet for Healthcare Providers: IncredibleEmployment.be  This test is not yet approved or cleared by the [place] FDA and has been authorized for detection and/or diagnosis of SARS-CoV-2 by FDA under an Emergency Use Authorization (EUA). This EUA will remain in effect (meaning this test can be used) for the duration of the COVID-19 declaration under Section 564(b)(1) of the Act, 21 U.S.C. section 360bbb-3(b)(1), unless the authorization is terminated or revoked.  Performed at [facility], [address], [place], [place] [address]      Time coordinating discharge: 35 minutes  SIGNED:   Rodena Goldmann, DO [facility] 04/17/2022, 10:37 AM  If 7PM-7AM, please contact night-coverage [email or web address]

## 2022-04-18 LAB — CULTURE, BLOOD (ROUTINE X 2)
Culture: NO GROWTH
Culture: NO GROWTH
Special Requests: ADEQUATE
Special Requests: ADEQUATE

## 2022-05-06 ENCOUNTER — Ambulatory Visit (INDEPENDENT_AMBULATORY_CARE_PROVIDER_SITE_OTHER): Payer: Medicare HMO | Admitting: Gastroenterology

## 2022-05-06 ENCOUNTER — Other Ambulatory Visit (INDEPENDENT_AMBULATORY_CARE_PROVIDER_SITE_OTHER): Payer: Self-pay | Admitting: Gastroenterology

## 2022-05-06 ENCOUNTER — Encounter (INDEPENDENT_AMBULATORY_CARE_PROVIDER_SITE_OTHER): Payer: Self-pay | Admitting: Gastroenterology

## 2022-05-06 VITALS — BP 120/73 | HR 76 | Temp 97.5°F | Ht 66.0 in | Wt 150.1 lb

## 2022-05-06 DIAGNOSIS — B3781 Candidal esophagitis: Secondary | ICD-10-CM | POA: Diagnosis not present

## 2022-05-06 DIAGNOSIS — K581 Irritable bowel syndrome with constipation: Secondary | ICD-10-CM

## 2022-05-06 MED ORDER — TRULANCE 3 MG PO TABS: 3.0000 mg | ORAL_TABLET | Freq: Every day | ORAL | 1 refills | Status: DC

## 2022-05-06 NOTE — Progress Notes (Unsigned)
Traci Clark, M.D. Gastroenterology & Hepatology [facility] [address] [place], [place] [address]  Primary Care Physician: Lavella Lemons, [place] [id number]  I will communicate my assessment and recommendations to the referring MD via EMR.  Problems: IBS-C Candida esophagitis  History of Present Illness: Traci Clark is a 88 y.o. female with past medical history of CKD, history of colon cancer status post resection, diabetes, IBS, GERD, seizures, glaucoma, anxiety, DVT, gout, who presents for follow up of constipation.  The patient was last seen on 02/04/2022. At that time, the patient was scheduled for an EGD with finding described below.  She was advised to continue Carafate and to take PPI every day.  Patient was found to have candidiasis in her most recent EGD.  States that after her Diflucan course, her chest pain resolved. However, she is still having constipation.  She reports she is having a bowel movement every 4-5 days, last bowel movement was yesterday. She is currently taking Miralax every 2 capfuls every day. She feels very nauseated but no vomiting.Also endorses significant pain in her abdomen diffusely. Has lost 6 lb since she left the hospital as he appetite has decreased  She has tried Linzess in the past but could not tolerate it. The patient denies having any nausea, vomiting, fever, chills, hematochezia, melena, hematemesis, abdominal distention, diarrhea, jaundice, pruritus or weight loss.  Notably, she was recently admitted for AMS, found to have a gout flare and severe AKI, which improved with hydration.   Last Colonoscopy:07/21/20 presence of a patent anastomosis in the ascending colon with normal mucosa, 3 polyps between 2 to 5 mm were removed from the ascending colon and descending colon. Last Endoscopy: 03/2022 - White nummular lesions in esophageal mucosa. Biopsied. - A few gastric  polyps. Biopsied normal mucosa for H. pylori testing. - Normal examined duodenum.  A.   STOMACH, BIOPSY:  -    Reactive gastropathy.  -    Negative for an inflammatory pattern predictive of Helicobacter  pylori infection.  -    Negative for intestinal metaplasia and malignancy.   B.  ESOPHAGUS, BIOPSY:  -    Severe acute Candida esophagitis.  -    No viral cytopathic change identified.  -    No malignancy identified.  -    No glandular mucosa present.   Patient was prescribed a 3 week course of antifungal treatment.  Past Medical History: Past Medical History:  Diagnosis Date   Anxiety    Arthritis    Chronic constipation    CKD (chronic kidney disease), stage III (HCC)    Colon cancer (HCC)    colon   Complication of anesthesia    Diabetes mellitus without complication ([place])    DVT (deep vein thrombosis) in pregnancy    Dysphagia    GERD (gastroesophageal reflux disease)    Glaucoma    Gout    H/O hiatal hernia    History of kidney stones    Iliac artery aneurysm, bilateral (HCC)    MI (myocardial infarction) ([place]) 1993   PONV (postoperative nausea and vomiting)    Restless leg syndrome    Seizure ([place])    withdrawal from xanax   Vertigo     Past Surgical History: Past Surgical History:  Procedure Laterality Date   BIOPSY  07/21/2020   Procedure: BIOPSY;  Surgeon: Harvel Quale, MD;  [facility];  Service: Gastroenterology;;   BIOPSY  03/26/2022   Procedure: BIOPSY;  Surgeon: Montez Morita, Quillian Quince, MD;  [facility];  Service: Gastroenterology;;   CHOLECYSTECTOMY     COLECTOMY  2003   COLONOSCOPY N/A 07/29/2012   Procedure: COLONOSCOPY;  Surgeon: Rogene Houston, MD;  [facility];  Service: Endoscopy;  Laterality: N/A;  100   COLONOSCOPY WITH PROPOFOL N/A 12/13/2016   Procedure: COLONOSCOPY WITH PROPOFOL;  Surgeon: Rogene Houston, MD;  [facility];  Service: Endoscopy;  Laterality: N/A;  8:30    COLONOSCOPY WITH PROPOFOL N/A 07/21/2020   Procedure: COLONOSCOPY WITH PROPOFOL;  Surgeon: Harvel Quale, MD;  [facility];  Service: Gastroenterology;  Laterality: N/A;  AM   ESOPHAGEAL DILATION  11/03/2015   Procedure: ESOPHAGEAL DILATION;  Surgeon: Rogene Houston, MD;  [facility];  Service: Endoscopy;;   ESOPHAGEAL DILATION N/A 09/26/2017   Procedure: ESOPHAGEAL DILATION;  Surgeon: Rogene Houston, MD;  [facility];  Service: Endoscopy;  Laterality: N/A;   ESOPHAGOGASTRODUODENOSCOPY N/A 03/25/2014   Procedure: ESOPHAGOGASTRODUODENOSCOPY (EGD);  Surgeon: Rogene Houston, MD;  [facility];  Service: Endoscopy;  Laterality: N/A;  1055   ESOPHAGOGASTRODUODENOSCOPY N/A 11/03/2015   Procedure: ESOPHAGOGASTRODUODENOSCOPY (EGD);  Surgeon: Rogene Houston, MD;  [facility];  Service: Endoscopy;  Laterality: N/A;  1:55 - moved to 11:15 - Ann to notify - moved to 12:45 - pt knows to arrive at 11:45   ESOPHAGOGASTRODUODENOSCOPY N/A 09/26/2017   Procedure: ESOPHAGOGASTRODUODENOSCOPY (EGD);  Surgeon: Rogene Houston, MD;  [facility];  Service: Endoscopy;  Laterality: N/A;   ESOPHAGOGASTRODUODENOSCOPY (EGD) WITH PROPOFOL N/A 07/21/2020   Procedure: ESOPHAGOGASTRODUODENOSCOPY (EGD) WITH PROPOFOL;  Surgeon: Harvel Quale, MD;  [facility];  Service: Gastroenterology;  Laterality: N/A;   ESOPHAGOGASTRODUODENOSCOPY (EGD) WITH PROPOFOL N/A 03/26/2022   Procedure: ESOPHAGOGASTRODUODENOSCOPY (EGD) WITH PROPOFOL;  Surgeon: Harvel Quale, MD;  [facility];  Service: Gastroenterology;  Laterality: N/A;  1215 ASA 3   MALONEY DILATION N/A 03/25/2014   Procedure: MALONEY DILATION;  Surgeon: Rogene Houston, MD;  [facility];  Service: Endoscopy;  Laterality: N/A;   POLYPECTOMY  12/13/2016   Procedure: POLYPECTOMY;  Surgeon: Rogene Houston, MD;  [facility];  Service:  Endoscopy;;  polyp   POLYPECTOMY  07/21/2020   Procedure: POLYPECTOMY;  Surgeon: Harvel Quale, MD;  [facility];  Service: Gastroenterology;;   TOTAL HIP ARTHROPLASTY Left    TUBAL LIGATION     VEIN SURGERY Bilateral    stripped vein due to DVT    Family History: Family History  Problem Relation Age of Onset   Heart disease Father     Social History: Social History   Tobacco Use  Smoking Status Never   Passive exposure: Never  Smokeless Tobacco Never   Social History   Substance and Sexual Activity  Alcohol Use No   Alcohol/week: 0.0 standard drinks of alcohol   Social History   Substance and Sexual Activity  Drug Use No    Allergies: Allergies  Allergen Reactions   Amoxil [Amoxicillin] Anaphylaxis and Rash    "Skin peeled off"   Macrobid [Nitrofurantoin Monohyd Macro] Anaphylaxis, Rash and Other (See Comments)    "Skin peeled off"   Pravachol [Pravastatin] Other (See Comments)    MYALGIAS  MYALGIAS  MYALGIAS  MYALGIAS   Codeine Nausea And Vomiting and Other (See Comments)    shaking  Darvon [Propoxyphene] Nausea And Vomiting and Other (See Comments)    shaking   Mirtazapine Other (See Comments)    Does not tolerate   Neurontin [Gabapentin] Nausea Only    Feel faint   Phenergan [Promethazine] Other (See Comments)    altered mental changes   Rocephin [Ceftriaxone] Other (See Comments)    Yeast infection   Hydrocodone-Acetaminophen Nausea And Vomiting and Other (See Comments)    Causes chest to feel heavy      Medications: Current Outpatient Medications  Medication Sig Dispense Refill   acetaminophen (TYLENOL) 500 MG tablet Take 1,000 mg by mouth every 6 (six) hours as needed (pain.). GELCAPS     albuterol (VENTOLIN HFA) 108 (90 Base) MCG/ACT inhaler Inhale 1-2 puffs into the lungs every 6 (six) hours as needed for shortness of breath or wheezing. prn     allopurinol (ZYLOPRIM) 100 MG tablet Take 100 mg by mouth in the  morning.     ALPRAZolam (XANAX) 0.25 MG tablet Take 1 tablet (0.25 mg total) by mouth 2 (two) times daily as needed for anxiety. 10 tablet 0   aspirin EC 81 MG tablet Take 1 tablet (81 mg total) by mouth daily. Swallow whole. 30 tablet 12   dexlansoprazole (DEXILANT) 60 MG capsule Take 60 mg by mouth daily before breakfast.     fluticasone (FLONASE) 50 MCG/ACT nasal spray Place 2 sprays into both nostrils daily as needed (allergies (cough/sneezing)).     furosemide (LASIX) 20 MG tablet Take 20 mg by mouth in the morning.     insulin aspart (NOVOLOG FLEXPEN RELION) 100 UNIT/ML FlexPen Inject 4-10 Units into the skin 3 (three) times daily with meals. Per sliding scale     Insulin Glargine (BASAGLAR KWIKPEN [place]) Inject 15 Units into the skin at bedtime.     latanoprost (XALATAN) 0.005 % ophthalmic solution Place 1 drop into both eyes at bedtime.     meclizine (ANTIVERT) 25 MG tablet Take 1 tablet (25 mg total) by mouth 3 (three) times daily as needed for dizziness. 30 tablet 0   Multiple Vitamin (MULTIVITAMIN) tablet Take 1 tablet by mouth daily.     ondansetron (ZOFRAN) 4 MG tablet Take 4 mg by mouth every 6 (six) hours as needed for nausea or vomiting.     polyethylene glycol (MIRALAX / GLYCOLAX) packet Take 34 g by mouth in the morning.     potassium chloride (MICRO-K) 10 MEQ CR capsule Take 10 mEq by mouth in the morning.     traMADol (ULTRAM) 50 MG tablet Take 1 tablet (50 mg total) by mouth every 6 (six) hours as needed for severe pain. 10 tablet 0   triamcinolone cream (KENALOG) 0.1 % Apply 1 Application topically 2 (two) times daily.     No current facility-administered medications for this visit.    Review of Systems: GENERAL: negative for malaise, night sweats HEENT: No changes in hearing or vision, no nose bleeds or other nasal problems. NECK: Negative for lumps, goiter, pain and significant neck swelling RESPIRATORY: Negative for cough, wheezing CARDIOVASCULAR: Negative for chest  pain, leg swelling, palpitations, orthopnea GI: SEE HPI MUSCULOSKELETAL: Negative for joint pain or swelling, back pain, and muscle pain. SKIN: Negative for lesions, rash PSYCH: Negative for sleep disturbance, mood disorder and recent psychosocial stressors. HEMATOLOGY Negative for prolonged bleeding, bruising easily, and swollen nodes. ENDOCRINE: Negative for cold or heat intolerance, polyuria, polydipsia and goiter. NEURO: negative for tremor, gait imbalance, syncope and seizures. The remainder of the review of  systems is noncontributory.   Physical Exam: BP 120/73 (BP Location: Left Arm, Patient Position: Sitting, Cuff Size: Large)   Pulse 76   Temp (!) 97.5 F (36.4 C) (Temporal)   Ht '5\' 6"'$  (1.676 m)   Wt 150 lb 1.6 oz (68.1 kg)   BMI 24.23 kg/m  GENERAL: The patient is AO x3, in no acute distress. HEENT: Head is normocephalic and atraumatic. EOMI are intact. Mouth is well hydrated and without lesions. NECK: Supple. No masses LUNGS: Clear to auscultation. No presence of rhonchi/wheezing/rales. Adequate chest expansion HEART: RRR, normal s1 and s2. ABDOMEN: Soft, nontender, no guarding, no peritoneal signs, and nondistended. BS +. No masses. EXTREMITIES: Without any cyanosis, clubbing, rash, lesions or edema. NEUROLOGIC: AOx3, no focal motor deficit. SKIN: no jaundice, no rashes  Imaging/Labs: as above  I personally reviewed and interpreted the available labs, imaging and endoscopic files.  Impression and Plan: Jamilyn Pigeon is a 88 y.o. female with past medical history of CKD, history of colon cancer status post resection, diabetes, IBS, GERD, seizures, glaucoma, anxiety, DVT, gout, who presents for follow up of constipation.  The patient has presented persistent episodes of constipation that have not improved with the use of MiraLAX 2 capfuls per day.  Unfortunately she was not able to tolerate the intake of Linzess as she had multiple episode of diarrhea and urgency.   Will try a different agent at this time, she will be started on Trulance 3 mg every day and I advised her to stop MiraLAX on the day she starts this medication.  Esophageal candidiasis has resolved with the use of Diflucan no further workup is warranted for this.  -Start Trulance 3 mg qday -Stop Miralax for now  All questions were answered.      Traci Peppers, MD Gastroenterology and Hepatology [facility]enterology

## 2022-05-06 NOTE — Patient Instructions (Signed)
Start Trulance 3 mg qday Stop Miralax for now

## 2022-05-29 ENCOUNTER — Observation Stay (HOSPITAL_COMMUNITY)
Admission: EM | Admit: 2022-05-29 | Discharge: 2022-05-31 | Disposition: A | Discharge status: Home Health | Payer: Medicare HMO | Attending: Family Medicine | Admitting: Family Medicine

## 2022-05-29 ENCOUNTER — Other Ambulatory Visit: Payer: Self-pay

## 2022-05-29 ENCOUNTER — Emergency Department (HOSPITAL_COMMUNITY): Payer: Medicare HMO

## 2022-05-29 ENCOUNTER — Encounter (HOSPITAL_COMMUNITY): Payer: Self-pay | Admitting: *Deleted

## 2022-05-29 DIAGNOSIS — Z96642 Presence of left artificial hip joint: Secondary | ICD-10-CM | POA: Diagnosis not present

## 2022-05-29 DIAGNOSIS — Z79899 Other long term (current) drug therapy: Secondary | ICD-10-CM | POA: Insufficient documentation

## 2022-05-29 DIAGNOSIS — Z7982 Long term (current) use of aspirin: Secondary | ICD-10-CM | POA: Insufficient documentation

## 2022-05-29 DIAGNOSIS — I2699 Other pulmonary embolism without acute cor pulmonale: Secondary | ICD-10-CM | POA: Diagnosis not present

## 2022-05-29 DIAGNOSIS — E1165 Type 2 diabetes mellitus with hyperglycemia: Secondary | ICD-10-CM | POA: Diagnosis not present

## 2022-05-29 DIAGNOSIS — E46 Unspecified protein-calorie malnutrition: Secondary | ICD-10-CM

## 2022-05-29 DIAGNOSIS — Z85038 Personal history of other malignant neoplasm of large intestine: Secondary | ICD-10-CM | POA: Diagnosis not present

## 2022-05-29 DIAGNOSIS — Z794 Long term (current) use of insulin: Secondary | ICD-10-CM | POA: Diagnosis not present

## 2022-05-29 DIAGNOSIS — Z8673 Personal history of transient ischemic attack (TIA), and cerebral infarction without residual deficits: Secondary | ICD-10-CM

## 2022-05-29 DIAGNOSIS — N1832 Chronic kidney disease, stage 3b: Secondary | ICD-10-CM | POA: Diagnosis not present

## 2022-05-29 DIAGNOSIS — M109 Gout, unspecified: Secondary | ICD-10-CM | POA: Diagnosis present

## 2022-05-29 DIAGNOSIS — R109 Unspecified abdominal pain: Secondary | ICD-10-CM | POA: Diagnosis present

## 2022-05-29 DIAGNOSIS — K219 Gastro-esophageal reflux disease without esophagitis: Secondary | ICD-10-CM | POA: Diagnosis present

## 2022-05-29 DIAGNOSIS — I129 Hypertensive chronic kidney disease with stage 1 through stage 4 chronic kidney disease, or unspecified chronic kidney disease: Secondary | ICD-10-CM | POA: Insufficient documentation

## 2022-05-29 DIAGNOSIS — Z86718 Personal history of other venous thrombosis and embolism: Secondary | ICD-10-CM | POA: Diagnosis not present

## 2022-05-29 DIAGNOSIS — R110 Nausea: Secondary | ICD-10-CM

## 2022-05-29 DIAGNOSIS — I10 Essential (primary) hypertension: Secondary | ICD-10-CM | POA: Diagnosis present

## 2022-05-29 LAB — CBC WITH DIFFERENTIAL/PLATELET
Abs Immature Granulocytes: 0.07 10*3/uL (ref 0.00–0.07)
Basophils Absolute: 0.1 10*3/uL (ref 0.0–0.1)
Basophils Relative: 1 %
Eosinophils Absolute: 0.2 10*3/uL (ref 0.0–0.5)
Eosinophils Relative: 2 %
HCT: 35.3 % — ABNORMAL LOW (ref 36.0–46.0)
Hemoglobin: 11.6 g/dL — ABNORMAL LOW (ref 12.0–15.0)
Immature Granulocytes: 1 %
Lymphocytes Relative: 26 %
Lymphs Abs: 2.6 10*3/uL (ref 0.7–4.0)
MCH: 28.9 pg (ref 26.0–34.0)
MCHC: 32.9 g/dL (ref 30.0–36.0)
MCV: 87.8 fL (ref 80.0–100.0)
Monocytes Absolute: 0.8 10*3/uL (ref 0.1–1.0)
Monocytes Relative: 8 %
Neutro Abs: 6.3 10*3/uL (ref 1.7–7.7)
Neutrophils Relative %: 62 %
Platelets: 356 10*3/uL (ref 150–400)
RBC: 4.02 MIL/uL (ref 3.87–5.11)
RDW: 13.5 % (ref 11.5–15.5)
WBC: 10 10*3/uL (ref 4.0–10.5)
nRBC: 0 % (ref 0.0–0.2)

## 2022-05-29 LAB — APTT: aPTT: 34 seconds (ref 24–36)

## 2022-05-29 LAB — COMPREHENSIVE METABOLIC PANEL
ALT: 19 U/L (ref 0–44)
AST: 31 U/L (ref 15–41)
Albumin: 3 g/dL — ABNORMAL LOW (ref 3.5–5.0)
Alkaline Phosphatase: 106 U/L (ref 38–126)
Anion gap: 8 (ref 5–15)
BUN: 16 mg/dL (ref 8–23)
CO2: 27 mmol/L (ref 22–32)
Calcium: 9.3 mg/dL (ref 8.9–10.3)
Chloride: 97 mmol/L — ABNORMAL LOW (ref 98–111)
Creatinine, Ser: 1.41 mg/dL — ABNORMAL HIGH (ref 0.44–1.00)
GFR, Estimated: 36 mL/min — ABNORMAL LOW (ref >60)
Glucose, Bld: 214 mg/dL — ABNORMAL HIGH (ref 70–99)
Potassium: 4.2 mmol/L (ref 3.5–5.1)
Sodium: 132 mmol/L — ABNORMAL LOW (ref 135–145)
Total Bilirubin: 0.3 mg/dL (ref 0.3–1.2)
Total Protein: 7.4 g/dL (ref 6.5–8.1)

## 2022-05-29 LAB — URINALYSIS, ROUTINE W REFLEX MICROSCOPIC
Bilirubin Urine: NEGATIVE
Glucose, UA: NEGATIVE mg/dL
Hgb urine dipstick: NEGATIVE
Ketones, ur: NEGATIVE mg/dL
Leukocytes,Ua: NEGATIVE
Nitrite: NEGATIVE
Protein, ur: NEGATIVE mg/dL
Specific Gravity, Urine: 1.008 (ref 1.005–1.030)
pH: 6 (ref 5.0–8.0)

## 2022-05-29 LAB — LIPASE, BLOOD: Lipase: 38 U/L (ref 11–51)

## 2022-05-29 LAB — PROTIME-INR
INR: 1.2 (ref 0.8–1.2)
Prothrombin Time: 15.3 seconds — ABNORMAL HIGH (ref 11.4–15.2)

## 2022-05-29 MED ORDER — IOHEXOL 350 MG/ML SOLN
75.0000 mL | Freq: Once | INTRAVENOUS | Status: AC | PRN
  Administered 2022-05-29: 75 mL via INTRAVENOUS

## 2022-05-29 MED ORDER — HEPARIN BOLUS VIA INFUSION
3500.0000 [IU] | Freq: Once | INTRAVENOUS | Status: AC
  Administered 2022-05-29: 3500 [IU] via INTRAVENOUS

## 2022-05-29 MED ORDER — HEPARIN BOLUS VIA INFUSION: 4000.0000 [IU] | Freq: Once | INTRAVENOUS | Status: DC

## 2022-05-29 MED ORDER — HEPARIN (PORCINE) 25000 UT/250ML-% IV SOLN
950.0000 [IU]/h | INTRAVENOUS | Status: DC
  Administered 2022-05-29: 1100 [IU]/h via INTRAVENOUS
  Administered 2022-05-30: 950 [IU]/h via INTRAVENOUS
  Filled 2022-05-29 (×2): qty 250

## 2022-05-29 NOTE — H&P (Incomplete)
History and Physical    Patient: Traci Clark EXB:[id number] DOB: [birth year] DOA: 05/29/2022 DOS: the patient was seen and examined on 05/29/2022 PCP: Lavella Lemons, PA  Patient coming from: Home  Chief Complaint:  Chief Complaint  Patient presents with   Flank Pain   HPI: Traci Clark is a 89 y.o. female with medical history significant of ***  Review of Systems: {ROS_Text:26778} Past Medical History:  Diagnosis Date   Anxiety    Arthritis    Chronic constipation    CKD (chronic kidney disease), stage III ([place])    Colon cancer ([place])    colon   Complication of anesthesia    Diabetes mellitus without complication ([place])    DVT (deep vein thrombosis) in pregnancy    Dysphagia    GERD (gastroesophageal reflux disease)    Glaucoma    Gout    H/O hiatal hernia    History of kidney stones    Iliac artery aneurysm, bilateral (HCC)    MI (myocardial infarction) ([place]) 1993   PONV (postoperative nausea and vomiting)    Restless leg syndrome    Seizure ([place])    withdrawal from xanax   Vertigo    Past Surgical History:  Procedure Laterality Date   BIOPSY  07/21/2020   Procedure: BIOPSY;  Surgeon: Harvel Quale, MD;  [facility];  Service: Gastroenterology;;   BIOPSY  03/26/2022   Procedure: BIOPSY;  Surgeon: Harvel Quale, MD;  [facility];  Service: Gastroenterology;;   CHOLECYSTECTOMY     COLECTOMY  2003   COLONOSCOPY N/A 07/29/2012   Procedure: COLONOSCOPY;  Surgeon: Rogene Houston, MD;  [facility];  Service: Endoscopy;  Laterality: N/A;  100   COLONOSCOPY WITH PROPOFOL N/A 12/13/2016   Procedure: COLONOSCOPY WITH PROPOFOL;  Surgeon: Rogene Houston, MD;  [facility];  Service: Endoscopy;  Laterality: N/A;  8:30   COLONOSCOPY WITH PROPOFOL N/A 07/21/2020   Procedure: COLONOSCOPY WITH PROPOFOL;  Surgeon: Harvel Quale, MD;  [facility];  Service: Gastroenterology;   Laterality: N/A;  AM   ESOPHAGEAL DILATION  11/03/2015   Procedure: ESOPHAGEAL DILATION;  Surgeon: Rogene Houston, MD;  [facility];  Service: Endoscopy;;   ESOPHAGEAL DILATION N/A 09/26/2017   Procedure: ESOPHAGEAL DILATION;  Surgeon: Rogene Houston, MD;  [facility];  Service: Endoscopy;  Laterality: N/A;   ESOPHAGOGASTRODUODENOSCOPY N/A 03/25/2014   Procedure: ESOPHAGOGASTRODUODENOSCOPY (EGD);  Surgeon: Rogene Houston, MD;  [facility];  Service: Endoscopy;  Laterality: N/A;  1055   ESOPHAGOGASTRODUODENOSCOPY N/A 11/03/2015   Procedure: ESOPHAGOGASTRODUODENOSCOPY (EGD);  Surgeon: Rogene Houston, MD;  [facility];  Service: Endoscopy;  Laterality: N/A;  1:55 - moved to 11:15 - Ann to notify - moved to 12:45 - pt knows to arrive at 11:45   ESOPHAGOGASTRODUODENOSCOPY N/A 09/26/2017   Procedure: ESOPHAGOGASTRODUODENOSCOPY (EGD);  Surgeon: Rogene Houston, MD;  [facility];  Service: Endoscopy;  Laterality: N/A;   ESOPHAGOGASTRODUODENOSCOPY (EGD) WITH PROPOFOL N/A 07/21/2020   Procedure: ESOPHAGOGASTRODUODENOSCOPY (EGD) WITH PROPOFOL;  Surgeon: Harvel Quale, MD;  [facility];  Service: Gastroenterology;  Laterality: N/A;   ESOPHAGOGASTRODUODENOSCOPY (EGD) WITH PROPOFOL N/A 03/26/2022   Procedure: ESOPHAGOGASTRODUODENOSCOPY (EGD) WITH PROPOFOL;  Surgeon: Harvel Quale, MD;  [facility];  Service: Gastroenterology;  Laterality: N/A;  1215 ASA 3   MALONEY DILATION N/A 03/25/2014   Procedure: MALONEY DILATION;  Surgeon: Rogene Houston, MD;  [facility];  Service: Endoscopy;  Laterality: N/A;   POLYPECTOMY  12/13/2016   Procedure: POLYPECTOMY;  Surgeon: Rogene Houston, MD;  [facility];  Service: Endoscopy;;  polyp   POLYPECTOMY  07/21/2020   Procedure: POLYPECTOMY;  Surgeon: Harvel Quale, MD;  [facility];  Service: Gastroenterology;;   TOTAL HIP  ARTHROPLASTY Left    TUBAL LIGATION     VEIN SURGERY Bilateral    stripped vein due to DVT   Social History:  reports that she has never smoked. She has never been exposed to tobacco smoke. She has never used smokeless tobacco. She reports that she does not drink alcohol and does not use drugs.  Allergies  Allergen Reactions   Amoxil [Amoxicillin] Anaphylaxis and Rash    "Skin peeled off"   Macrobid [Nitrofurantoin Monohyd Macro] Anaphylaxis, Rash and Other (See Comments)    "Skin peeled off"   Sulfa Antibiotics Other (See Comments)    Thrush and itching   Pravachol [Pravastatin] Other (See Comments)    MYALGIAS  MYALGIAS  MYALGIAS  MYALGIAS   Codeine Nausea And Vomiting and Other (See Comments)    shaking   Darvon [Propoxyphene] Nausea And Vomiting and Other (See Comments)    shaking   Mirtazapine Other (See Comments)    Does not tolerate   Neurontin [Gabapentin] Nausea Only    Feel faint   Phenergan [Promethazine] Other (See Comments)    altered mental changes   Rocephin [Ceftriaxone] Other (See Comments)    Yeast infection   Hydrocodone-Acetaminophen Nausea And Vomiting and Other (See Comments)    Causes chest to feel heavy      Family History  Problem Relation Age of Onset   Heart disease Father     Prior to Admission medications   Medication Sig Start Date End Date Taking? Authorizing Provider  acetaminophen (TYLENOL) 500 MG tablet Take 1,000 mg by mouth every 6 (six) hours as needed (pain.). GELCAPS   Yes [provider]  albuterol (VENTOLIN HFA) 108 (90 Base) MCG/ACT inhaler Inhale 1-2 puffs into the lungs every 6 (six) hours as needed for shortness of breath or wheezing. prn   Yes [provider]  allopurinol (ZYLOPRIM) 100 MG tablet Take 100 mg by mouth in the morning.   Yes [provider]  ALPRAZolam (XANAX) 0.25 MG tablet Take 1 tablet (0.25 mg total) by mouth 2 (two) times daily as needed for anxiety. 04/17/22  Yes Shah, Pratik D,  DO  aspirin EC 81 MG tablet Take 1 tablet (81 mg total) by mouth daily. Swallow whole. 04/18/22  Yes Shah, Pratik D, DO  dexlansoprazole (DEXILANT) 60 MG capsule Take 60 mg by mouth daily before breakfast.   Yes [provider]  fluticasone (FLONASE) 50 MCG/ACT nasal spray Place 2 sprays into both nostrils daily as needed (allergies (cough/sneezing)). 09/24/20  Yes [provider]  furosemide (LASIX) 20 MG tablet Take 20 mg by mouth in the morning.   Yes [provider]  insulin aspart (NOVOLOG FLEXPEN RELION) 100 UNIT/ML FlexPen Inject 4-10 Units into the skin 3 (three) times daily with meals. Per sliding scale   Yes [provider]  Insulin Glargine (BASAGLAR KWIKPEN [place]) Inject 15 Units into the skin at bedtime.   Yes [provider]  latanoprost (XALATAN) 0.005 % ophthalmic solution Place 1 drop into both eyes at bedtime. 09/21/20  Yes [provider]  meclizine (ANTIVERT) 25 MG  tablet Take 1 tablet (25 mg total) by mouth 3 (three) times daily as needed for dizziness. 07/30/21  Yes Carlan, Chelsea L, NP  Multiple Vitamin (MULTIVITAMIN) tablet Take 1 tablet by mouth daily.   Yes [provider]  multivitamin-lutein (OCUVITE-LUTEIN) CAPS capsule Take 1 capsule by mouth daily.   Yes [provider]  ondansetron (ZOFRAN) 4 MG tablet Take 4 mg by mouth every 6 (six) hours as needed for nausea or vomiting.   Yes [provider]  Plecanatide (TRULANCE) 3 MG TABS Take 3 mg by mouth daily. 05/06/22  Yes Harvel Quale, MD  polyethylene glycol [facility] / Floria [facility]) packet Take 34 g by mouth in the morning.   Yes [provider]  potassium chloride (MICRO-K) 10 MEQ CR capsule Take 10 mEq by mouth in the morning.   Yes [provider]  traMADol (ULTRAM) 50 MG tablet Take 1 tablet (50 mg total) by mouth every 6 (six) hours as needed for severe pain. 04/17/22  Yes Shah, Pratik D, DO  triamcinolone cream  (KENALOG) 0.1 % Apply 1 Application topically 2 (two) times daily. 01/22/22  Yes [provider]    Physical Exam: Vitals:   05/29/22 1815 05/29/22 1830 05/29/22 1952 05/29/22 2030  BP:  135/86 132/82 (!) 141/69  Pulse: 78 74 78 72  Resp:  '18 18 18  '$ Temp:      TempSrc:      SpO2: 93% 95% 94% 94%  Weight:      Height:       *** Data Reviewed: {Tip this will not be part of the note when signed- Document your independent interpretation of telemetry tracing, EKG, lab, Radiology test or any other diagnostic tests. Add any new diagnostic test ordered today. (Optional):26781} {Results:26384}  Assessment and Plan: No notes have been filed under this hospital service. Service: Hospitalist     Advance Care Planning:   Code Status: Prior ***  Consults: ***  Family Communication: ***  Severity of Illness: {Observation/Inpatient:21159}  Author: Bernadette Hoit, DO 05/29/2022 9:14 PM  For on call review [email or web address].

## 2022-05-29 NOTE — ED Triage Notes (Signed)
Pt with recent UTI and seen at PCP office today, sent here for right flank pain. + nausea, denies emesis or diarrhea.

## 2022-05-29 NOTE — Progress Notes (Signed)
ANTICOAGULATION CONSULT NOTE - Initial Consult  Pharmacy Consult for Heparin Indication: pulmonary embolus  Allergies  Allergen Reactions   Amoxil [Amoxicillin] Anaphylaxis and Rash    "Skin peeled off"   Macrobid [Nitrofurantoin Monohyd Macro] Anaphylaxis, Rash and Other (See Comments)    "Skin peeled off"   Sulfa Antibiotics Other (See Comments)    Thrush and itching   Pravachol [Pravastatin] Other (See Comments)    MYALGIAS  MYALGIAS  MYALGIAS  MYALGIAS   Codeine Nausea And Vomiting and Other (See Comments)    shaking   Darvon [Propoxyphene] Nausea And Vomiting and Other (See Comments)    shaking   Mirtazapine Other (See Comments)    Does not tolerate   Neurontin [Gabapentin] Nausea Only    Feel faint   Phenergan [Promethazine] Other (See Comments)    altered mental changes   Rocephin [Ceftriaxone] Other (See Comments)    Yeast infection   Hydrocodone-Acetaminophen Nausea And Vomiting and Other (See Comments)    Causes chest to feel heavy      Patient Measurements: Height: '5\' 6"'$  (167.6 cm) Weight: 68 kg (150 lb) IBW/kg (Calculated) : 59.3 Heparin Dosing Weight: 68 kg  Vital Signs: Temp: 98.1 F (36.7 C) (01/10 1706) Temp Source: Oral (01/10 1706) BP: 141/69 (01/10 2030) Pulse Rate: 72 (01/10 2030)  Labs: Recent Labs    05/29/22 1817  HGB 11.6*  HCT 35.3*  PLT 356  CREATININE 1.41*    Estimated Creatinine Clearance: 25.3 mL/min (A) (by C-G formula based on SCr of 1.41 mg/dL (H)).   Medical History: Past Medical History:  Diagnosis Date   Anxiety    Arthritis    Chronic constipation    CKD (chronic kidney disease), stage III (HCC)    Colon cancer (HCC)    colon   Complication of anesthesia    Diabetes mellitus without complication ([place])    DVT (deep vein thrombosis) in pregnancy    Dysphagia    GERD (gastroesophageal reflux disease)    Glaucoma    Gout    H/O hiatal hernia    History of kidney stones    Iliac artery aneurysm, bilateral  (HCC)    MI (myocardial infarction) ([place]) 1993   PONV (postoperative nausea and vomiting)    Restless leg syndrome    Seizure ([place])    withdrawal from xanax   Vertigo     Medications:  Scheduled:   heparin  3,500 Units Intravenous Once    Assessment: Pt admitted with right flank/chest pain. Ct chest Acute PE is seen with moderate thrombus burden in both lungs, more so in the right lung. Pharmacy consulted for heparin dosing. Pt on no anticoagulant prior to admission, only ASA 81 mg daily.  Goal of Therapy:  Heparin level 0.3-0.7 units/ml Monitor platelets by anticoagulation protocol: Yes   Plan:  Give 3500 units bolus x 1 Start heparin infusion at 1100 units/hr Check anti-Xa level in 8 hours and daily while on heparin Continue to monitor H&H and platelets  [place] Pharmacist 05/29/2022,9:37 PM

## 2022-05-29 NOTE — ED Provider Notes (Signed)
[facility] EMERGENCY DEPARTMENT Provider Note   CSN: [id number] Arrival date & time: 05/29/22  1700     History  Chief Complaint  Patient presents with   Flank Pain    Traci Clark is a 89 y.o. female.   Flank Pain  Patient events with pain in her right flank/chest.  Has had for days now.  Worse with certain movements.  Has had some nausea.  No vomiting.  Had been treated with UTI around Christmas time.  Reportedly went to PCP and had negative urine.  Reportedly sent into the ER however no paperwork sent with patient.  No cough.  Worse with certain positions.  Does hurt to take of breath.    Past Medical History:  Diagnosis Date   Anxiety    Arthritis    Chronic constipation    CKD (chronic kidney disease), stage III (HCC)    Colon cancer (HCC)    colon   Complication of anesthesia    Diabetes mellitus without complication ([place])    DVT (deep vein thrombosis) in pregnancy    Dysphagia    GERD (gastroesophageal reflux disease)    Glaucoma    Gout    H/O hiatal hernia    History of kidney stones    Iliac artery aneurysm, bilateral (HCC)    MI (myocardial infarction) ([place]) 1993   PONV (postoperative nausea and vomiting)    Restless leg syndrome    Seizure ([place])    withdrawal from xanax   Vertigo     Home Medications Prior to Admission medications   Medication Sig Start Date End Date Taking? Authorizing Provider  acetaminophen (TYLENOL) 500 MG tablet Take 1,000 mg by mouth every 6 (six) hours as needed (pain.). GELCAPS   Yes [provider]  albuterol (VENTOLIN HFA) 108 (90 Base) MCG/ACT inhaler Inhale 1-2 puffs into the lungs every 6 (six) hours as needed for shortness of breath or wheezing. prn   Yes [provider]  allopurinol (ZYLOPRIM) 100 MG tablet Take 100 mg by mouth in the morning.   Yes [provider]  ALPRAZolam (XANAX) 0.25 MG tablet Take 1 tablet (0.25 mg total) by mouth 2 (two) times daily as needed for anxiety.  04/17/22  Yes Shah, Pratik D, DO  aspirin EC 81 MG tablet Take 1 tablet (81 mg total) by mouth daily. Swallow whole. 04/18/22  Yes Shah, Pratik D, DO  dexlansoprazole (DEXILANT) 60 MG capsule Take 60 mg by mouth daily before breakfast.   Yes [provider]  fluticasone (FLONASE) 50 MCG/ACT nasal spray Place 2 sprays into both nostrils daily as needed (allergies (cough/sneezing)). 09/24/20  Yes [provider]  furosemide (LASIX) 20 MG tablet Take 20 mg by mouth in the morning.   Yes [provider]  insulin aspart (NOVOLOG FLEXPEN RELION) 100 UNIT/ML FlexPen Inject 4-10 Units into the skin 3 (three) times daily with meals. Per sliding scale   Yes [provider]  Insulin Glargine (BASAGLAR KWIKPEN ) Inject 15 Units into the skin at bedtime.   Yes [provider]  latanoprost (XALATAN) 0.005 % ophthalmic solution Place 1 drop into both eyes at bedtime. 09/21/20  Yes [provider]  meclizine (ANTIVERT) 25 MG tablet Take 1 tablet (25 mg total) by mouth 3 (three) times daily as needed for dizziness. 07/30/21  Yes Carlan, Chelsea L, NP  Multiple Vitamin (MULTIVITAMIN) tablet Take 1 tablet by mouth daily.   Yes [provider]  multivitamin-lutein (OCUVITE-LUTEIN) CAPS capsule Take  1 capsule by mouth daily.   Yes [provider]  ondansetron (ZOFRAN) 4 MG tablet Take 4 mg by mouth every 6 (six) hours as needed for nausea or vomiting.   Yes [provider]  Plecanatide (TRULANCE) 3 MG TABS Take 3 mg by mouth daily. 05/06/22  Yes Harvel Quale, MD  polyethylene glycol [facility] / Floria Raveling) packet Take 34 g by mouth in the morning.   Yes [provider]  potassium chloride (MICRO-K) 10 MEQ CR capsule Take 10 mEq by mouth in the morning.   Yes [provider]  traMADol (ULTRAM) 50 MG tablet Take 1 tablet (50 mg total) by mouth every 6 (six) hours as needed for severe pain. 04/17/22  Yes Shah, Pratik D,  DO  triamcinolone cream (KENALOG) 0.1 % Apply 1 Application topically 2 (two) times daily. 01/22/22  Yes [provider]      Allergies    Amoxil [amoxicillin], Macrobid [nitrofurantoin monohyd macro], Sulfa antibiotics, Pravachol [pravastatin], Codeine, Darvon [propoxyphene], Mirtazapine, Neurontin [gabapentin], Phenergan [promethazine], Rocephin [ceftriaxone], and Hydrocodone-acetaminophen    Review of Systems   Review of Systems  Genitourinary:  Positive for flank pain.    Physical Exam Updated Vital Signs BP (!) 141/69   Pulse 72   Temp 98.1 F (36.7 C) (Oral)   Resp 18   Ht '5\' 6"'$  (1.676 m)   Wt 68 kg   SpO2 94%   BMI 24.21 kg/m  Physical Exam Vitals and nursing note reviewed.  HENT:     Head: Atraumatic.  Eyes:     Pupils: Pupils are equal, round, and reactive to light.  Cardiovascular:     Rate and Rhythm: Normal rate.  Pulmonary:     Comments: Tenderness to right lateral lower chest wall.  No rash.  No crepitance. Chest:     Chest wall: Tenderness present.  Abdominal:     Tenderness: There is abdominal tenderness.     Comments: Right upper quadrant tenderness without rebound or guarding.  Skin:    General: Skin is warm.     Capillary Refill: Capillary refill takes less than 2 seconds.  Neurological:     Mental Status: She is alert.     ED Results / Procedures / Treatments   Labs (all labs ordered are listed, but only abnormal results are displayed) Labs Reviewed  COMPREHENSIVE METABOLIC PANEL - Abnormal; Notable for the following components:      Result Value   Sodium 132 (*)    Chloride 97 (*)    Glucose, Bld 214 (*)    Creatinine, Ser 1.41 (*)    Albumin 3.0 (*)    GFR, Estimated 36 (*)    All other components within normal limits  CBC WITH DIFFERENTIAL/PLATELET - Abnormal; Notable for the following components:   Hemoglobin 11.6 (*)    HCT 35.3 (*)    All other components within normal limits  URINALYSIS, ROUTINE W REFLEX MICROSCOPIC -  Abnormal; Notable for the following components:   Color, Urine STRAW (*)    All other components within normal limits  LIPASE, BLOOD    EKG None  Radiology CT Angio Chest PE W and/or Wo Contrast  Result Date: 05/29/2022 CLINICAL DATA:  High clinical suspicion for PE, right flank pain, nausea, recent UTI EXAM: CT ANGIOGRAPHY CHEST WITH CONTRAST TECHNIQUE: Multidetector CT imaging of the chest was performed using the standard protocol during bolus administration of intravenous contrast. Multiplanar CT image reconstructions and MIPs were obtained to evaluate the vascular  anatomy. RADIATION DOSE REDUCTION: This exam was performed according to the departmental dose-optimization program which includes automated exposure control, adjustment of the mA and/or kV according to patient size and/or use of iterative reconstruction technique. CONTRAST:  108m OMNIPAQUE IOHEXOL 350 MG/ML SOLN COMPARISON:  Previous studies including the chest radiograph done earlier today FINDINGS: Cardiovascular: There is homogeneous enhancement in thoracic aorta. Acute PE is seen with moderate thrombus burden in both lungs, more so in the right lung. Filling defects are seen in the right upper lobe and right lower lobe pulmonary arteries extending into multiple segmental and subsegmental branches. There is small filling defect in left lower lobe pulmonary artery extending into few segmental branches. RV LV ratio is 0.93. Scattered coronary artery calcifications are seen. Mediastinum/Nodes: No significant lymphadenopathy seen. There is frothy density in the lumen of thoracic esophagus suggesting possible gastroesophageal reflux. Lungs/Pleura: Small patchy infiltrates are seen in right lower lobe. This may suggest pneumonia or developing pulmonary infarction. Small linear densities are seen in both lower lung fields suggesting subsegmental atelectasis. There is minimal right pleural effusion. There is no pneumothorax. Upper Abdomen: No  acute findings are seen. Musculoskeletal: Degenerative changes are noted in both shoulders, more so on the left side. Review of the MIP images confirms the above findings. IMPRESSION: Acute PE with moderate thrombus burden, more so in the right lung. There are patchy infiltrates in right lower lung field suggesting atelectasis/pneumonia and possibly evolving infarction. Small right pleural effusion is seen. Scattered coronary artery calcifications are seen. Degenerative changes are noted in both shoulders, more severe in the left shoulder. Possible gastroesophageal reflux. Other findings as described in the body of the report. Imaging finding of acute PE was relayed to patient's provider Dr. PAlvino Chapelby telephone call. Electronically Signed   By: PElmer PickerM.D.   On: 05/29/2022 20:46   CT ABDOMEN PELVIS W CONTRAST  Result Date: 05/29/2022 CLINICAL DATA:  Abdominal/flank pain, stone suspected. Right flank pain, nausea EXAM: CT ABDOMEN AND PELVIS WITH CONTRAST TECHNIQUE: Multidetector CT imaging of the abdomen and pelvis was performed using the standard protocol following bolus administration of intravenous contrast. RADIATION DOSE REDUCTION: This exam was performed according to the departmental dose-optimization program which includes automated exposure control, adjustment of the mA and/or kV according to patient size and/or use of iterative reconstruction technique. CONTRAST:  728mOMNIPAQUE IOHEXOL 350 MG/ML SOLN COMPARISON:  None Available. FINDINGS: Lower chest: Mild right basilar atelectasis. Cardiac size within normal limits. Known extensive pulmonary emboli are better seen on accompanying CT examination of the chest. Hepatobiliary: Tiny cyst within the right hepatic lobe. No enhancing intrahepatic mass. Status post cholecystectomy. No intra or extrahepatic biliary ductal dilation. Pancreas: Unremarkable. No pancreatic ductal dilatation or surrounding inflammatory changes. Spleen: Normal in size  without focal abnormality. Adrenals/Urinary Tract: The adrenal glands are unremarkable. The kidneys are normal in size and position. Multiple simple and mildly complex renal cortical cysts are seen bilaterally, better evaluated on prior MRI examination of 12/29/2018. No follow-up imaging is recommended for these lesions. No enhancing intrarenal masses identified. No hydronephrosis. No intrarenal or ureteral calculi. No perinephric inflammatory stranding or fluid collections are identified. The bladder is unremarkable. Stomach/Bowel: Status post right hemicolectomy. The residual stomach, small bowel, and large bowel are unremarkable. No free intraperitoneal gas or fluid. Vascular/Lymphatic: Aortic atherosclerosis. No enlarged abdominal or pelvic lymph nodes. Reproductive: Uterus and bilateral adnexa are unremarkable. Other: No abdominal wall hernia or abnormality. No abdominopelvic ascites. Musculoskeletal: Status post left total hip arthroplasty. No  acute bone abnormality. Degenerative changes are seen within the lumbar spine. IMPRESSION: 1. No acute intra-abdominal pathology identified. 2. Known extensive pulmonary emboli are better seen on accompanying CT examination of the chest. 3. Status post right hemicolectomy. 4. Aortic atherosclerosis. Aortic Atherosclerosis (ICD10-I70.0). Electronically Signed   By: Fidela Salisbury M.D.   On: 05/29/2022 20:37   DG Chest 2 View  Result Date: 05/29/2022 CLINICAL DATA:  Right flank pain and nausea. EXAM: CHEST - 2 VIEW COMPARISON:  April 13, 2022 FINDINGS: The heart size and mediastinal contours are within normal limits. Both lungs are clear. Degenerative changes are seen involving both shoulders, left greater than right. IMPRESSION: No active cardiopulmonary disease. Electronically Signed   By: Virgina Norfolk M.D.   On: 05/29/2022 17:55    Procedures Procedures    Medications Ordered in ED Medications  iohexol (OMNIPAQUE) 350 MG/ML injection 75 mL (75 mLs  Intravenous Contrast Given 05/29/22 2012)    ED Course/ Medical Decision Making/ A&P                           Medical Decision Making Amount and/or Complexity of Data Reviewed Labs: ordered. Radiology: ordered.  Risk Prescription drug management. Decision regarding hospitalization.   Patient with right-sided chest/abdominal pain.  Reportedly recently treated for UTI.  Has had some nausea and vomiting.  No emesis or diarrhea.  Differential diagnosis is long and includes pneumonia, chest wall pain, shingles, intra-abdominal pathology, UTI, kidney stone. X-ray done and reassuring.  Independently interpreted shows no pneumonia.  Will also get urinalysis and basic blood work.  Reviewed discharge summary from November 29.  Patient's CT scan independently inter by me and reviewed with radiology.  Has moderate clot burden of pulmonary embolisms in the right side and less clots on the left.  I think this is the cause of the pain.  With the amount of clot burden and unclear cause I feel she would benefit from admission to the hospital.  Did have DVTs when she was pregnant.  Will discuss with hospitalist for admission.  Right leg rib pain        Final Clinical Impression(s) / ED Diagnoses Final diagnoses:  Acute pulmonary embolism, unspecified pulmonary embolism type, unspecified whether acute cor pulmonale present [employer] [employer])    Rx / DC Orders ED Discharge Orders     None         Davonna Belling, MD 05/29/22 2058

## 2022-05-30 ENCOUNTER — Observation Stay (HOSPITAL_COMMUNITY): Payer: Medicare HMO

## 2022-05-30 ENCOUNTER — Observation Stay (HOSPITAL_BASED_OUTPATIENT_CLINIC_OR_DEPARTMENT_OTHER): Payer: Medicare HMO

## 2022-05-30 DIAGNOSIS — I2699 Other pulmonary embolism without acute cor pulmonale: Secondary | ICD-10-CM

## 2022-05-30 DIAGNOSIS — N1832 Chronic kidney disease, stage 3b: Secondary | ICD-10-CM | POA: Insufficient documentation

## 2022-05-30 DIAGNOSIS — R0602 Shortness of breath: Secondary | ICD-10-CM

## 2022-05-30 LAB — HEPARIN LEVEL (UNFRACTIONATED)
Heparin Unfractionated: 0.77 IU/mL — ABNORMAL HIGH (ref 0.30–0.70)
Heparin Unfractionated: 0.58 IU/mL (ref 0.30–0.70)
Heparin Unfractionated: 0.43 IU/mL (ref 0.30–0.70)

## 2022-05-30 LAB — ECHOCARDIOGRAM COMPLETE
AR max vel: 2.74 cm2
AV Area VTI: 2.3 cm2
AV Area mean vel: 2.33 cm2
AV Mean grad: 3 mmHg
AV Peak grad: 5.4 mmHg
Ao pk vel: 1.16 m/s
Area-P 1/2: 4.93 cm2
Height: 66 in
MV VTI: 3.28 cm2
S' Lateral: 2.5 cm
Weight: 2366.86 oz

## 2022-05-30 LAB — CBC
HCT: 32.7 % — ABNORMAL LOW (ref 36.0–46.0)
Hemoglobin: 11.1 g/dL — ABNORMAL LOW (ref 12.0–15.0)
MCH: 29.1 pg (ref 26.0–34.0)
MCHC: 33.9 g/dL (ref 30.0–36.0)
MCV: 85.8 fL (ref 80.0–100.0)
Platelets: 312 10*3/uL (ref 150–400)
RBC: 3.81 MIL/uL — ABNORMAL LOW (ref 3.87–5.11)
RDW: 13.5 % (ref 11.5–15.5)
WBC: 8.7 10*3/uL (ref 4.0–10.5)
nRBC: 0 % (ref 0.0–0.2)

## 2022-05-30 LAB — GLUCOSE, CAPILLARY
Glucose-Capillary: 168 mg/dL — ABNORMAL HIGH (ref 70–99)
Glucose-Capillary: 225 mg/dL — ABNORMAL HIGH (ref 70–99)
Glucose-Capillary: 194 mg/dL — ABNORMAL HIGH (ref 70–99)
Glucose-Capillary: 228 mg/dL — ABNORMAL HIGH (ref 70–99)

## 2022-05-30 LAB — COMPREHENSIVE METABOLIC PANEL
ALT: 16 U/L (ref 0–44)
AST: 26 U/L (ref 15–41)
Albumin: 2.7 g/dL — ABNORMAL LOW (ref 3.5–5.0)
Alkaline Phosphatase: 97 U/L (ref 38–126)
Anion gap: 10 (ref 5–15)
BUN: 15 mg/dL (ref 8–23)
CO2: 26 mmol/L (ref 22–32)
Calcium: 9.3 mg/dL (ref 8.9–10.3)
Chloride: 97 mmol/L — ABNORMAL LOW (ref 98–111)
Creatinine, Ser: 1.29 mg/dL — ABNORMAL HIGH (ref 0.44–1.00)
GFR, Estimated: 40 mL/min — ABNORMAL LOW (ref >60)
Glucose, Bld: 187 mg/dL — ABNORMAL HIGH (ref 70–99)
Potassium: 4 mmol/L (ref 3.5–5.1)
Sodium: 133 mmol/L — ABNORMAL LOW (ref 135–145)
Total Bilirubin: 0.6 mg/dL (ref 0.3–1.2)
Total Protein: 6.3 g/dL — ABNORMAL LOW (ref 6.5–8.1)

## 2022-05-30 LAB — CBG MONITORING, ED: Glucose-Capillary: 181 mg/dL — ABNORMAL HIGH (ref 70–99)

## 2022-05-30 LAB — MAGNESIUM: Magnesium: 2 mg/dL (ref 1.7–2.4)

## 2022-05-30 LAB — PHOSPHORUS: Phosphorus: 3.6 mg/dL (ref 2.5–4.6)

## 2022-05-30 LAB — ANTITHROMBIN III: AntiThromb III Func: 101 % (ref 75–120)

## 2022-05-30 MED ORDER — INSULIN ASPART 100 UNIT/ML IJ SOLN
0.0000 [IU] | Freq: Every day | INTRAMUSCULAR | Status: DC
  Administered 2022-05-30: 2 [IU] via SUBCUTANEOUS

## 2022-05-30 MED ORDER — ACETAMINOPHEN 650 MG RE SUPP: 650.0000 mg | Freq: Four times a day (QID) | RECTAL | Status: DC | PRN

## 2022-05-30 MED ORDER — PANTOPRAZOLE SODIUM 40 MG PO TBEC
40.0000 mg | DELAYED_RELEASE_TABLET | Freq: Every day | ORAL | Status: DC
  Administered 2022-05-30 – 2022-05-31 (×2): 40 mg via ORAL
  Filled 2022-05-30 (×2): qty 1

## 2022-05-30 MED ORDER — ALLOPURINOL 100 MG PO TABS
100.0000 mg | ORAL_TABLET | Freq: Every morning | ORAL | Status: DC
  Administered 2022-05-30 – 2022-05-31 (×2): 100 mg via ORAL
  Filled 2022-05-30 (×2): qty 1

## 2022-05-30 MED ORDER — ONDANSETRON HCL 4 MG PO TABS: 4.0000 mg | ORAL_TABLET | Freq: Four times a day (QID) | ORAL | Status: DC | PRN

## 2022-05-30 MED ORDER — INSULIN ASPART 100 UNIT/ML IJ SOLN
0.0000 [IU] | Freq: Three times a day (TID) | INTRAMUSCULAR | Status: DC
  Administered 2022-05-30: 2 [IU] via SUBCUTANEOUS
  Administered 2022-05-30: 3 [IU] via SUBCUTANEOUS
  Administered 2022-05-30 – 2022-05-31 (×2): 2 [IU] via SUBCUTANEOUS

## 2022-05-30 MED ORDER — INSULIN GLARGINE-YFGN 100 UNIT/ML ~~LOC~~ SOLN
10.0000 [IU] | Freq: Every day | SUBCUTANEOUS | Status: DC
  Administered 2022-05-30: 10 [IU] via SUBCUTANEOUS
  Filled 2022-05-30 (×2): qty 0.1

## 2022-05-30 MED ORDER — ONDANSETRON HCL 4 MG/2ML IJ SOLN: 4.0000 mg | Freq: Four times a day (QID) | INTRAMUSCULAR | Status: DC | PRN

## 2022-05-30 MED ORDER — ACETAMINOPHEN 325 MG PO TABS: 650.0000 mg | ORAL_TABLET | Freq: Four times a day (QID) | ORAL | Status: DC | PRN

## 2022-05-30 MED ORDER — ASPIRIN 81 MG PO TBEC
81.0000 mg | DELAYED_RELEASE_TABLET | Freq: Every day | ORAL | Status: DC
  Administered 2022-05-30 – 2022-05-31 (×2): 81 mg via ORAL
  Filled 2022-05-30 (×2): qty 1

## 2022-05-30 MED ORDER — GLUCERNA SHAKE PO LIQD
237.0000 mL | Freq: Three times a day (TID) | ORAL | Status: DC
  Administered 2022-05-30 – 2022-05-31 (×3): 237 mL via ORAL
  Filled 2022-05-30 (×2): qty 237

## 2022-05-30 NOTE — Progress Notes (Signed)
[place] for Heparin Indication: pulmonary embolus Brief A/P: Heparin level supratherapeutic Decrease Heparin rate  Allergies  Allergen Reactions   Amoxil [Amoxicillin] Anaphylaxis and Rash    "Skin peeled off"   Macrobid [Nitrofurantoin Monohyd Macro] Anaphylaxis, Rash and Other (See Comments)    "Skin peeled off"   Sulfa Antibiotics Other (See Comments)    Thrush and itching   Pravachol [Pravastatin] Other (See Comments)    MYALGIAS  MYALGIAS  MYALGIAS  MYALGIAS   Codeine Nausea And Vomiting and Other (See Comments)    shaking   Darvon [Propoxyphene] Nausea And Vomiting and Other (See Comments)    shaking   Mirtazapine Other (See Comments)    Does not tolerate   Neurontin [Gabapentin] Nausea Only    Feel faint   Phenergan [Promethazine] Other (See Comments)    altered mental changes   Rocephin [Ceftriaxone] Other (See Comments)    Yeast infection   Hydrocodone-Acetaminophen Nausea And Vomiting and Other (See Comments)    Causes chest to feel heavy      Patient Measurements: Height: '5\' 6"'$  (167.6 cm) Weight: 67.1 kg (147 lb 14.9 oz) IBW/kg (Calculated) : 59.3 Heparin Dosing Weight: 68 kg  Vital Signs: Temp: 98.5 F (36.9 C) (01/11 0607) Temp Source: Oral (01/11 0607) BP: 123/63 (01/11 0607) Pulse Rate: 62 (01/11 0607)  Labs: Recent Labs    05/29/22 1817 05/29/22 2159 05/30/22 0247 05/30/22 0545  HGB 11.6*  --  11.1*  --   HCT 35.3*  --  32.7*  --   PLT 356  --  312  --   APTT  --  34  --   --   LABPROT  --  15.3*  --   --   INR  --  1.2  --   --   HEPARINUNFRC  --   --   --  0.77*  CREATININE 1.41*  --  1.29*  --      Estimated Creatinine Clearance: 27.7 mL/min (A) (by C-G formula based on SCr of 1.29 mg/dL (H)).   Assessment: 89 y.o. female with PE for heparin  Goal of Therapy:  Heparin level 0.3-0.7 units/ml Monitor platelets by anticoagulation protocol: Yes   Plan:  Decrease Heparin 950  units/hr Check heparin level in 8 hours.   Phillis Knack, PharmD, BCPS

## 2022-05-30 NOTE — Progress Notes (Signed)
[place] for Heparin Indication: pulmonary embolus  Allergies  Allergen Reactions   Amoxil [Amoxicillin] Anaphylaxis and Rash    "Skin peeled off"   Macrobid [Nitrofurantoin Monohyd Macro] Anaphylaxis, Rash and Other (See Comments)    "Skin peeled off"   Sulfa Antibiotics Other (See Comments)    Thrush and itching   Pravachol [Pravastatin] Other (See Comments)    MYALGIAS  MYALGIAS  MYALGIAS  MYALGIAS   Codeine Nausea And Vomiting and Other (See Comments)    shaking   Darvon [Propoxyphene] Nausea And Vomiting and Other (See Comments)    shaking   Mirtazapine Other (See Comments)    Does not tolerate   Neurontin [Gabapentin] Nausea Only    Feel faint   Phenergan [Promethazine] Other (See Comments)    altered mental changes   Rocephin [Ceftriaxone] Other (See Comments)    Yeast infection   Hydrocodone-Acetaminophen Nausea And Vomiting and Other (See Comments)    Causes chest to feel heavy      Patient Measurements: Height: '5\' 6"'$  (167.6 cm) Weight: 67.1 kg (147 lb 14.9 oz) IBW/kg (Calculated) : 59.3 Heparin Dosing Weight: 68 kg  Vital Signs: Temp: 99.9 F (37.7 C) (01/11 2037) Temp Source: Oral (01/11 2037) BP: 132/70 (01/11 2037) Pulse Rate: 71 (01/11 2037)  Labs: Recent Labs    05/29/22 1817 05/29/22 2159 05/30/22 0247 05/30/22 0545 05/30/22 1406 05/30/22 2200  HGB 11.6*  --  11.1*  --   --   --   HCT 35.3*  --  32.7*  --   --   --   PLT 356  --  312  --   --   --   APTT  --  34  --   --   --   --   LABPROT  --  15.3*  --   --   --   --   INR  --  1.2  --   --   --   --   HEPARINUNFRC  --   --   --  0.77* 0.58 0.43  CREATININE 1.41*  --  1.29*  --   --   --      Estimated Creatinine Clearance: 27.7 mL/min (A) (by C-G formula based on SCr of 1.29 mg/dL (H)).   Assessment: 89 y.o. female with PE for heparin  1/11 PM update:  Heparin level therapeutic x 2  Goal of Therapy:  Heparin level 0.3-0.7  units/ml Monitor platelets by anticoagulation protocol: Yes   Plan:  Continue heparin at 950 units/hr Daily CBC and heparin level Monitor for bleeding  Narda Bonds, PharmD, [place] Pharmacist Phone: [phone]

## 2022-05-30 NOTE — H&P (Addendum)
History and Physical    Patient: Traci Clark DVV:[id number] DOB: [birth year] DOA: 05/29/2022 DOS: the patient was seen and examined on 05/30/2022 PCP: Lavella Lemons, PA  Patient coming from: Home  Chief Complaint:  Chief Complaint  Patient presents with   Flank Pain   HPI: Traci Clark is a 89 y.o. female with medical history significant of hypertension, T2DM, GERD, constipation, gout and recent endoscopy significant for candidal esophagitis for which she has started on oral Diflucan. Patient went to her PCP due to about 1 week onset of right-sided chest/rib cage intermittent pain which was worse with movements and taking deep breath and was associated with some nausea without vomiting.  She was then asked to go to the ER for further evaluation and management.  Patient was recently treated for UTI around Christmas time.  She denies fever, chills, headache, palpitations, abdominal pain, vomiting or diarrhea.  Patient lives with grandson.  ED Course:  In the emergency department, she was hemodynamically stable.  Workup in the ED showed normocytic anemia, BMP showed sodium 132, potassium 4.2, chloride 97, bicarb 27, blood glucose 214, BUN 16, creatinine 1.4 (baseline creatinine at 1.2-1.40), albumin 3.0 Prescription angiography chest with contrast showed acute PE with moderate thrombosed body, mostly in the right lung. There are patchy infiltrates in right lower lung field suggesting atelectasis/pneumonia and possibly evolving infarction. Small right pleural effusion is seen. CT abdomen and pelvis with contrast showed no acute intra-abdominal pathology identified. Extensive pulmonary emboli are better seen on accompanying CT examination of the chest Chest x-ray showed no active cardiopulmonary disease Patient was started on drip, hospitalist was asked to admit patient for further evaluation and management.  Review of Systems: Review of systems as noted in the HPI. All other systems  reviewed and are negative.   Past Medical History:  Diagnosis Date   Anxiety    Arthritis    Chronic constipation    CKD (chronic kidney disease), stage III (HCC)    Colon cancer (HCC)    colon   Complication of anesthesia    Diabetes mellitus without complication ([place])    DVT (deep vein thrombosis) in pregnancy    Dysphagia    GERD (gastroesophageal reflux disease)    Glaucoma    Gout    H/O hiatal hernia    History of kidney stones    Iliac artery aneurysm, bilateral (HCC)    MI (myocardial infarction) ([place]) 1993   PONV (postoperative nausea and vomiting)    Restless leg syndrome    Seizure ([place])    withdrawal from xanax   Vertigo    Past Surgical History:  Procedure Laterality Date   BIOPSY  07/21/2020   Procedure: BIOPSY;  Surgeon: Harvel Quale, MD;  [facility];  Service: Gastroenterology;;   BIOPSY  03/26/2022   Procedure: BIOPSY;  Surgeon: Harvel Quale, MD;  [facility];  Service: Gastroenterology;;   CHOLECYSTECTOMY     COLECTOMY  2003   COLONOSCOPY N/A 07/29/2012   Procedure: COLONOSCOPY;  Surgeon: Rogene Houston, MD;  [facility];  Service: Endoscopy;  Laterality: N/A;  100   COLONOSCOPY WITH PROPOFOL N/A 12/13/2016   Procedure: COLONOSCOPY WITH PROPOFOL;  Surgeon: Rogene Houston, MD;  [facility];  Service: Endoscopy;  Laterality: N/A;  8:30   COLONOSCOPY WITH PROPOFOL N/A 07/21/2020   Procedure: COLONOSCOPY WITH PROPOFOL;  Surgeon: Harvel Quale, MD;  [facility];  Service: Gastroenterology;  Laterality: N/A;  AM   ESOPHAGEAL DILATION  11/03/2015   Procedure: ESOPHAGEAL DILATION;  Surgeon: Rogene Houston, MD;  [facility];  Service: Endoscopy;;   ESOPHAGEAL DILATION N/A 09/26/2017   Procedure: ESOPHAGEAL DILATION;  Surgeon: Rogene Houston, MD;  [facility];  Service: Endoscopy;  Laterality: N/A;   ESOPHAGOGASTRODUODENOSCOPY N/A 03/25/2014    Procedure: ESOPHAGOGASTRODUODENOSCOPY (EGD);  Surgeon: Rogene Houston, MD;  [facility];  Service: Endoscopy;  Laterality: N/A;  1055   ESOPHAGOGASTRODUODENOSCOPY N/A 11/03/2015   Procedure: ESOPHAGOGASTRODUODENOSCOPY (EGD);  Surgeon: Rogene Houston, MD;  [facility];  Service: Endoscopy;  Laterality: N/A;  1:55 - moved to 11:15 - Ann to notify - moved to 12:45 - pt knows to arrive at 11:45   ESOPHAGOGASTRODUODENOSCOPY N/A 09/26/2017   Procedure: ESOPHAGOGASTRODUODENOSCOPY (EGD);  Surgeon: Rogene Houston, MD;  [facility];  Service: Endoscopy;  Laterality: N/A;   ESOPHAGOGASTRODUODENOSCOPY (EGD) WITH PROPOFOL N/A 07/21/2020   Procedure: ESOPHAGOGASTRODUODENOSCOPY (EGD) WITH PROPOFOL;  Surgeon: Harvel Quale, MD;  [facility];  Service: Gastroenterology;  Laterality: N/A;   ESOPHAGOGASTRODUODENOSCOPY (EGD) WITH PROPOFOL N/A 03/26/2022   Procedure: ESOPHAGOGASTRODUODENOSCOPY (EGD) WITH PROPOFOL;  Surgeon: Harvel Quale, MD;  [facility];  Service: Gastroenterology;  Laterality: N/A;  1215 ASA 3   MALONEY DILATION N/A 03/25/2014   Procedure: MALONEY DILATION;  Surgeon: Rogene Houston, MD;  [facility];  Service: Endoscopy;  Laterality: N/A;   POLYPECTOMY  12/13/2016   Procedure: POLYPECTOMY;  Surgeon: Rogene Houston, MD;  [facility];  Service: Endoscopy;;  polyp   POLYPECTOMY  07/21/2020   Procedure: POLYPECTOMY;  Surgeon: Harvel Quale, MD;  [facility];  Service: Gastroenterology;;   TOTAL HIP ARTHROPLASTY Left    TUBAL LIGATION     VEIN SURGERY Bilateral    stripped vein due to DVT    Social History:  reports that she has never smoked. She has never been exposed to tobacco smoke. She has never used smokeless tobacco. She reports that she does not drink alcohol and does not use drugs.   Allergies  Allergen Reactions   Amoxil [Amoxicillin] Anaphylaxis and Rash     "Skin peeled off"   Macrobid [Nitrofurantoin Monohyd Macro] Anaphylaxis, Rash and Other (See Comments)    "Skin peeled off"   Sulfa Antibiotics Other (See Comments)    Thrush and itching   Pravachol [Pravastatin] Other (See Comments)    MYALGIAS  MYALGIAS  MYALGIAS  MYALGIAS   Codeine Nausea And Vomiting and Other (See Comments)    shaking   Darvon [Propoxyphene] Nausea And Vomiting and Other (See Comments)    shaking   Mirtazapine Other (See Comments)    Does not tolerate   Neurontin [Gabapentin] Nausea Only    Feel faint   Phenergan [Promethazine] Other (See Comments)    altered mental changes   Rocephin [Ceftriaxone] Other (See Comments)    Yeast infection   Hydrocodone-Acetaminophen Nausea And Vomiting and Other (See Comments)    Causes chest to feel heavy      Family History  Problem Relation Age of Onset   Heart disease Father      Prior to Admission medications   Medication Sig Start Date End Date Taking? Authorizing Provider  acetaminophen (TYLENOL) 500 MG tablet Take 1,000 mg by mouth every 6 (six) hours as needed (pain.). GELCAPS   Yes [provider]  albuterol (VENTOLIN  HFA) 108 (90 Base) MCG/ACT inhaler Inhale 1-2 puffs into the lungs every 6 (six) hours as needed for shortness of breath or wheezing. prn   Yes [provider]  allopurinol (ZYLOPRIM) 100 MG tablet Take 100 mg by mouth in the morning.   Yes [provider]  ALPRAZolam (XANAX) 0.25 MG tablet Take 1 tablet (0.25 mg total) by mouth 2 (two) times daily as needed for anxiety. 04/17/22  Yes Shah, Pratik D, DO  aspirin EC 81 MG tablet Take 1 tablet (81 mg total) by mouth daily. Swallow whole. 04/18/22  Yes Shah, Pratik D, DO  dexlansoprazole (DEXILANT) 60 MG capsule Take 60 mg by mouth daily before breakfast.   Yes [provider]  fluticasone (FLONASE) 50 MCG/ACT nasal spray Place 2 sprays into both nostrils daily as needed (allergies (cough/sneezing)). 09/24/20  Yes  [provider]  furosemide (LASIX) 20 MG tablet Take 20 mg by mouth in the morning.   Yes [provider]  insulin aspart (NOVOLOG FLEXPEN RELION) 100 UNIT/ML FlexPen Inject 4-10 Units into the skin 3 (three) times daily with meals. Per sliding scale   Yes [provider]  Insulin Glargine (BASAGLAR KWIKPEN [place]) Inject 15 Units into the skin at bedtime.   Yes [provider]  latanoprost (XALATAN) 0.005 % ophthalmic solution Place 1 drop into both eyes at bedtime. 09/21/20  Yes [provider]  meclizine (ANTIVERT) 25 MG tablet Take 1 tablet (25 mg total) by mouth 3 (three) times daily as needed for dizziness. 07/30/21  Yes Carlan, Chelsea L, NP  Multiple Vitamin (MULTIVITAMIN) tablet Take 1 tablet by mouth daily.   Yes [provider]  multivitamin-lutein (OCUVITE-LUTEIN) CAPS capsule Take 1 capsule by mouth daily.   Yes [provider]  ondansetron (ZOFRAN) 4 MG tablet Take 4 mg by mouth every 6 (six) hours as needed for nausea or vomiting.   Yes [provider]  Plecanatide (TRULANCE) 3 MG TABS Take 3 mg by mouth daily. 05/06/22  Yes Harvel Quale, MD  polyethylene glycol [facility] Floria Raveling) packet Take 34 g by mouth in the morning.   Yes [provider]  potassium chloride (MICRO-K) 10 MEQ CR capsule Take 10 mEq by mouth in the morning.   Yes [provider]  traMADol (ULTRAM) 50 MG tablet Take 1 tablet (50 mg total) by mouth every 6 (six) hours as needed for severe pain. 04/17/22  Yes Shah, Pratik D, DO  triamcinolone cream (KENALOG) 0.1 % Apply 1 Application topically 2 (two) times daily. 01/22/22  Yes [provider]    Physical Exam: BP 122/68   Pulse 69   Temp 98.4 F (36.9 C) (Oral)   Resp 18   Ht '5\' 6"'$  (1.676 m)   Wt 68 kg   SpO2 94%   BMI 24.21 kg/m   General: 89 y.o. year-old female well developed well nourished in no acute distress.  Alert and oriented x3. HEENT: NCAT,  EOMI Neck: Supple, trachea medial Cardiovascular: Regular rate and rhythm with no rubs or gallops.  No thyromegaly or JVD noted.  No lower extremity edema. 2/4 pulses in all 4 extremities. Respiratory: Clear to auscultation with no wheezes or rales. Good inspiratory effort. Abdomen: Soft, nontender nondistended with normal bowel sounds x4 quadrants. Muskuloskeletal: Chest wall tenderness.  EKG was not done in the ED no cyanosis, clubbing or edema noted bilaterally Neuro: CN II-XII intact, strength 5/5 x 4, sensation, reflexes intact Skin: No ulcerative lesions noted or rashes  Psychiatry: Judgement and insight appear normal. Mood is appropriate for condition and setting          Labs on Admission:  Basic Metabolic Panel: Recent Labs  Lab 05/29/22 1817  NA 132*  K 4.2  CL 97*  CO2 27  GLUCOSE 214*  BUN 16  CREATININE 1.41*  CALCIUM 9.3   Liver Function Tests: Recent Labs  Lab 05/29/22 1817  AST 31  ALT 19  ALKPHOS 106  BILITOT 0.3  PROT 7.4  ALBUMIN 3.0*   Recent Labs  Lab 05/29/22 1817  LIPASE 38   No results for input(s): "AMMONIA" in the last 168 hours. CBC: Recent Labs  Lab 05/29/22 1817  WBC 10.0  NEUTROABS 6.3  HGB 11.6*  HCT 35.3*  MCV 87.8  PLT 356   Cardiac Enzymes: No results for input(s): "CKTOTAL", "CKMB", "CKMBINDEX", "TROPONINI" in the last 168 hours.  BNP (last 3 results) No results for input(s): "BNP" in the last 8760 hours.  ProBNP (last 3 results) No results for input(s): "PROBNP" in the last 8760 hours.  CBG: Recent Labs  Lab 05/30/22 0139  GLUCAP 181*    Radiological Exams on Admission: CT Angio Chest PE W and/or Wo Contrast  Result Date: 05/29/2022 CLINICAL DATA:  High clinical suspicion for PE, right flank pain, nausea, recent UTI EXAM: CT ANGIOGRAPHY CHEST WITH CONTRAST TECHNIQUE: Multidetector CT imaging of the chest was performed using the standard protocol during bolus administration of intravenous contrast. Multiplanar  CT image reconstructions and MIPs were obtained to evaluate the vascular anatomy. RADIATION DOSE REDUCTION: This exam was performed according to the departmental dose-optimization program which includes automated exposure control, adjustment of the mA and/or kV according to patient size and/or use of iterative reconstruction technique. CONTRAST:  27m OMNIPAQUE IOHEXOL 350 MG/ML SOLN COMPARISON:  Previous studies including the chest radiograph done earlier today FINDINGS: Cardiovascular: There is homogeneous enhancement in thoracic aorta. Acute PE is seen with moderate thrombus burden in both lungs, more so in the right lung. Filling defects are seen in the right upper lobe and right lower lobe pulmonary arteries extending into multiple segmental and subsegmental branches. There is small filling defect in left lower lobe pulmonary artery extending into few segmental branches. RV LV ratio is 0.93. Scattered coronary artery calcifications are seen. Mediastinum/Nodes: No significant lymphadenopathy seen. There is frothy density in the lumen of thoracic esophagus suggesting possible gastroesophageal reflux. Lungs/Pleura: Small patchy infiltrates are seen in right lower lobe. This may suggest pneumonia or developing pulmonary infarction. Small linear densities are seen in both lower lung fields suggesting subsegmental atelectasis. There is minimal right pleural effusion. There is no pneumothorax. Upper Abdomen: No acute findings are seen. Musculoskeletal: Degenerative changes are noted in both shoulders, more so on the left side. Review of the MIP images confirms the above findings. IMPRESSION: Acute PE with moderate thrombus burden, more so in the right lung. There are patchy infiltrates in right lower lung field suggesting atelectasis/pneumonia and possibly evolving infarction. Small right pleural effusion is seen. Scattered coronary artery calcifications are seen. Degenerative changes are noted in both shoulders, more  severe in the left shoulder. Possible gastroesophageal reflux. Other findings as described in the body of the report. Imaging finding of acute PE was relayed to patient's provider Dr. PAlvino Chapelby telephone call. Electronically Signed   By: PElmer PickerM.D.   On: 05/29/2022 20:46   CT ABDOMEN PELVIS W CONTRAST  Result Date: 05/29/2022 CLINICAL DATA:  Abdominal/flank pain, stone suspected. Right flank  pain, nausea EXAM: CT ABDOMEN AND PELVIS WITH CONTRAST TECHNIQUE: Multidetector CT imaging of the abdomen and pelvis was performed using the standard protocol following bolus administration of intravenous contrast. RADIATION DOSE REDUCTION: This exam was performed according to the departmental dose-optimization program which includes automated exposure control, adjustment of the mA and/or kV according to patient size and/or use of iterative reconstruction technique. CONTRAST:  50m OMNIPAQUE IOHEXOL 350 MG/ML SOLN COMPARISON:  None Available. FINDINGS: Lower chest: Mild right basilar atelectasis. Cardiac size within normal limits. Known extensive pulmonary emboli are better seen on accompanying CT examination of the chest. Hepatobiliary: Tiny cyst within the right hepatic lobe. No enhancing intrahepatic mass. Status post cholecystectomy. No intra or extrahepatic biliary ductal dilation. Pancreas: Unremarkable. No pancreatic ductal dilatation or surrounding inflammatory changes. Spleen: Normal in size without focal abnormality. Adrenals/Urinary Tract: The adrenal glands are unremarkable. The kidneys are normal in size and position. Multiple simple and mildly complex renal cortical cysts are seen bilaterally, better evaluated on prior MRI examination of 12/29/2018. No follow-up imaging is recommended for these lesions. No enhancing intrarenal masses identified. No hydronephrosis. No intrarenal or ureteral calculi. No perinephric inflammatory stranding or fluid collections are identified. The bladder is  unremarkable. Stomach/Bowel: Status post right hemicolectomy. The residual stomach, small bowel, and large bowel are unremarkable. No free intraperitoneal gas or fluid. Vascular/Lymphatic: Aortic atherosclerosis. No enlarged abdominal or pelvic lymph nodes. Reproductive: Uterus and bilateral adnexa are unremarkable. Other: No abdominal wall hernia or abnormality. No abdominopelvic ascites. Musculoskeletal: Status post left total hip arthroplasty. No acute bone abnormality. Degenerative changes are seen within the lumbar spine. IMPRESSION: 1. No acute intra-abdominal pathology identified. 2. Known extensive pulmonary emboli are better seen on accompanying CT examination of the chest. 3. Status post right hemicolectomy. 4. Aortic atherosclerosis. Aortic Atherosclerosis (ICD10-I70.0). Electronically Signed   By: AFidela SalisburyM.D.   On: 05/29/2022 20:37   DG Chest 2 View  Result Date: 05/29/2022 CLINICAL DATA:  Right flank pain and nausea. EXAM: CHEST - 2 VIEW COMPARISON:  April 13, 2022 FINDINGS: The heart size and mediastinal contours are within normal limits. Both lungs are clear. Degenerative changes are seen involving both shoulders, left greater than right. IMPRESSION: No active cardiopulmonary disease. Electronically Signed   By: TVirgina NorfolkM.D.   On: 05/29/2022 17:55    EKG: I independently viewed the EKG done and my findings are as followed: EKG was not done in the ED  Assessment/Plan Present on Admission:  [place]ulmonary [place]ism ([place]  Hyperglycemia due to diabetes mellitus ([place]  Gout  GERD (gastroesophageal reflux disease)  Essential hypertension  Principal Problem:   Pulmonary embolism ([place] Active Problems:   GERD (gastroesophageal reflux disease)   Essential hypertension   Gout   Hyperglycemia due to diabetes mellitus ([place]   Chronic kidney disease, stage 3b ([place]  Pulmonary embolism Patient presented with about 1 week onset of shortness of breath which worsens with deep  breaths and movements CT angiography chest with contrast showed  acute PE with moderate thrombosed body, mostly in the right lung Patient was started on IV heparin drip with plan to transition to DBrush [place] the morning Bilateral lower extremity ultrasound will be done in the morning Echocardiogram will be done in the morning  Nausea Continue Zofran as needed  Type 2 diabetes mellitus with uncontrolled hyperglycemia Hemoglobin A1c on 04/13/22 was 7.4 Continue Semglee 30 units nightly and adjust dose accordingly Continue ISS and hypoglycemic protocol  Hypoalbuminemia possibly secondary to moderate protein calorie malnutrition  Albumin 3.1, protein supplement to be provided  CKD 3B Renally adjust medications, avoid nephrotoxic agents/dehydration/hypotension  History of CVA Continue aspirin  GERD Continue Protonix  Essential hypertension-controlled Patient was not on any hypertensive medication Continue to monitor BP and treat accordingly  Gout Continue allopurinol  DVT prophylaxis: Heparin drip  Code Status: DNR  Family Communication: Grandson at bedside  Consults: None  Severity of Illness: The appropriate patient status for this patient is OBSERVATION. Observation status is judged to be reasonable and necessary in order to provide the required intensity of service to ensure the patient's safety. The patient's presenting symptoms, physical exam findings, and initial radiographic and laboratory data in the context of their medical condition is felt to place them at decreased risk for further clinical deterioration. Furthermore, it is anticipated that the patient will be medically stable for discharge from the hospital within 2 midnights of admission.   Author: Bernadette Hoit, DO 05/30/2022 1:51 AM  For on call review [email or web address].

## 2022-05-30 NOTE — Progress Notes (Signed)
Patient just admitted to 300 floor.

## 2022-05-30 NOTE — Progress Notes (Signed)
PROGRESS NOTE    Traci Clark  PXT:[id number] DOB: Mar 06, 1931 DOA: 05/29/2022 PCP: Lavella Lemons, PA   Brief Narrative:  This 89 years old female with PMH significant for hypertension, type 2 diabetes, GERD, constipation, gout, recent endoscopy significant for candidal esophagitis for which she has been taking oral Diflucan.  Patient went to her PCP for right-sided chest, rib cage pain for about 1 week which is worse with movements and taking deep breath and was associated with some nausea without vomiting.  She was told to come to the ED for evaluation.  In the ED she was hemodynamically stable.  Workup reveals acute pulmonary embolism and started on IV heparin.  Assessment & Plan:   Principal Problem:   Pulmonary embolism (HCC) Active Problems:   GERD (gastroesophageal reflux disease)   Essential hypertension   Gout   Hyperglycemia due to diabetes mellitus (HCC)   Chronic kidney disease, stage 3b (HCC)  Acute pulmonary embolism: Patient presented with 1 week history of right-sided chest / rib cage pain which is worse with movement and taking deep breath. CTA chest showed acute PE with moderate thrombosed body mostly in the right lung. Patient started on IV heparin, with the plan to transition to [place] before discharge. Bilateral lower extremity venous duplex completed, report pending Follow-up echocardiogram.  Nausea: Continue Zofran as needed.  Type 2 diabetes with uncontrolled hyperglycemia: Hemoglobin A1c 7.4. Continue Semglee 30 units nightly and adjust dose accordingly. Continue regular insulin sliding scale.  Carb modified diet.  CKD stage IIIb: Serum creatinine at baseline.  Avoid nephrotoxic medications.  History of CVA: Continue aspirin  GERD: Continue pantoprazole 40 mg daily.  Gout: Continue allopurinol  Essential hypertension: Patient is not on any antihypertensive medications.   DVT prophylaxis: Heparin IV  Code Status: DNR Family  Communication: No family at bed side. Disposition Plan:    Status is: Observation The patient remains OBS appropriate and will d/c before 2 midnights. Admitted for acute PE, started on IV heparin, likely transition to [place] before discharge    Consultants:  None  Procedures: CTA chest  Antimicrobials:  Anti-infectives (From admission, onward)    None      Subjective: Patient was seen and examined at bedside.  Overnight events noted.   Patient reports feeling better.  Patient denies any chest pain or shortness of breath at this time.  Objective: Vitals:   05/30/22 0300 05/30/22 0500 05/30/22 0530 05/30/22 0607  BP: 116/64 (!) 148/75 131/72 123/63  Pulse: 65 64 (!) 59 62  Resp: '18 17 17 18  '$ Temp:    98.5 F (36.9 C)  TempSrc:    Oral  SpO2: 95% 95% 96% 96%  Weight:    67.1 kg  Height:    '5\' 6"'$  (1.676 m)   No intake or output data in the 24 hours ending 05/30/22 1136 Filed Weights   05/29/22 1708 05/30/22 0607  Weight: 68 kg 67.1 kg    Examination:  General exam: Appears comfortable, not in any acute distress.  Deconditioned Respiratory system: CTA bilaterally, respiratory effort normal, RR 15. Cardiovascular system: S1 & S2 heard, regular rate and rhythm, no murmur. Gastrointestinal system: Abdomen is soft, non tender, non distended, BS+ Central nervous system: Alert and oriented x 3. No focal neurological deficits. Extremities: No edema, no cyanosis, no clubbing  Skin: No rashes, lesions or ulcers Psychiatry: Judgement and insight appear normal. Mood & affect appropriate.     Data Reviewed: I have personally reviewed following labs and  imaging studies  CBC: Recent Labs  Lab 05/29/22 1817 05/30/22 0247  WBC 10.0 8.7  NEUTROABS 6.3  --   HGB 11.6* 11.1*  HCT 35.3* 32.7*  MCV 87.8 85.8  PLT 356 563   Basic Metabolic Panel: Recent Labs  Lab 05/29/22 1817 05/30/22 0247  NA 132* 133*  K 4.2 4.0  CL 97* 97*  CO2 27 26  GLUCOSE 214* 187*  BUN 16  15  CREATININE 1.41* 1.29*  CALCIUM 9.3 9.3  MG  --  2.0  PHOS  --  3.6   GFR: Estimated Creatinine Clearance: 27.7 mL/min (A) (by C-G formula based on SCr of 1.29 mg/dL (H)). Liver Function Tests: Recent Labs  Lab 05/29/22 1817 05/30/22 0247  AST 31 26  ALT 19 16  ALKPHOS 106 97  BILITOT 0.3 0.6  PROT 7.4 6.3*  ALBUMIN 3.0* 2.7*   Recent Labs  Lab 05/29/22 1817  LIPASE 38   No results for input(s): "AMMONIA" in the last 168 hours. Coagulation Profile: Recent Labs  Lab 05/29/22 2159  INR 1.2   Cardiac Enzymes: No results for input(s): "CKTOTAL", "CKMB", "CKMBINDEX", "TROPONINI" in the last 168 hours. BNP (last 3 results) No results for input(s): "PROBNP" in the last 8760 hours. HbA1C: No results for input(s): "HGBA1C" in the last 72 hours. CBG: Recent Labs  Lab 05/30/22 0139 05/30/22 0748 05/30/22 1126  GLUCAP 181* 168* 225*   Lipid Profile: No results for input(s): "CHOL", "HDL", "LDLCALC", "TRIG", "CHOLHDL", "LDLDIRECT" in the last 72 hours. Thyroid Function Tests: No results for input(s): "TSH", "T4TOTAL", "FREET4", "T3FREE", "THYROIDAB" in the last 72 hours. Anemia Panel: No results for input(s): "VITAMINB12", "FOLATE", "FERRITIN", "TIBC", "IRON", "RETICCTPCT" in the last 72 hours. Sepsis Labs: No results for input(s): "PROCALCITON", "LATICACIDVEN" in the last 168 hours.  No results found for this or any previous visit (from the past 240 hour(s)).   Radiology Studies: US Venous Img Lower Bilateral (DVT)  Result Date: 05/30/2022 CLINICAL DATA:  Pulmonary embolism. EXAM: BILATERAL LOWER EXTREMITY VENOUS DOPPLER ULTRASOUND TECHNIQUE: Gray-scale sonography with compression, as well as color and duplex ultrasound, were performed to evaluate the deep venous system(s) from the level of the common femoral vein through the popliteal and proximal calf veins. COMPARISON:  None Available. FINDINGS: VENOUS Normal compressibility of the common femoral, superficial  femoral, and popliteal veins, as well as the visualized calf veins. Visualized portions of profunda femoral vein and great saphenous vein unremarkable. No filling defects to suggest DVT on grayscale or color Doppler imaging. Doppler waveforms show normal direction of venous flow, normal respiratory plasticity and response to augmentation. OTHER None. Limitations: none IMPRESSION: Negative for lower extremity DVT. Electronically Signed   By: Emmit Alexanders M.D   On: 05/30/2022 09:59   CT Angio Chest PE W and/or Wo Contrast  Result Date: 05/29/2022 CLINICAL DATA:  High clinical suspicion for PE, right flank pain, nausea, recent UTI EXAM: CT ANGIOGRAPHY CHEST WITH CONTRAST TECHNIQUE: Multidetector CT imaging of the chest was performed using the standard protocol during bolus administration of intravenous contrast. Multiplanar CT image reconstructions and MIPs were obtained to evaluate the vascular anatomy. RADIATION DOSE REDUCTION: This exam was performed according to the departmental dose-optimization program which includes automated exposure control, adjustment of the mA and/or kV according to patient size and/or use of iterative reconstruction technique. CONTRAST:  78m OMNIPAQUE IOHEXOL 350 MG/ML SOLN COMPARISON:  Previous studies including the chest radiograph done earlier today FINDINGS: Cardiovascular: There is homogeneous enhancement in thoracic aorta. Acute  PE is seen with moderate thrombus burden in both lungs, more so in the right lung. Filling defects are seen in the right upper lobe and right lower lobe pulmonary arteries extending into multiple segmental and subsegmental branches. There is small filling defect in left lower lobe pulmonary artery extending into few segmental branches. RV LV ratio is 0.93. Scattered coronary artery calcifications are seen. Mediastinum/Nodes: No significant lymphadenopathy seen. There is frothy density in the lumen of thoracic esophagus suggesting possible  gastroesophageal reflux. Lungs/Pleura: Small patchy infiltrates are seen in right lower lobe. This may suggest pneumonia or developing pulmonary infarction. Small linear densities are seen in both lower lung fields suggesting subsegmental atelectasis. There is minimal right pleural effusion. There is no pneumothorax. Upper Abdomen: No acute findings are seen. Musculoskeletal: Degenerative changes are noted in both shoulders, more so on the left side. Review of the MIP images confirms the above findings. IMPRESSION: Acute PE with moderate thrombus burden, more so in the right lung. There are patchy infiltrates in right lower lung field suggesting atelectasis/pneumonia and possibly evolving infarction. Small right pleural effusion is seen. Scattered coronary artery calcifications are seen. Degenerative changes are noted in both shoulders, more severe in the left shoulder. Possible gastroesophageal reflux. Other findings as described in the body of the report. Imaging finding of acute PE was relayed to patient's provider Dr. Alvino Chapel by telephone call. Electronically Signed   By: Elmer Picker M.D.   On: 05/29/2022 20:46   CT ABDOMEN PELVIS W CONTRAST  Result Date: 05/29/2022 CLINICAL DATA:  Abdominal/flank pain, stone suspected. Right flank pain, nausea EXAM: CT ABDOMEN AND PELVIS WITH CONTRAST TECHNIQUE: Multidetector CT imaging of the abdomen and pelvis was performed using the standard protocol following bolus administration of intravenous contrast. RADIATION DOSE REDUCTION: This exam was performed according to the departmental dose-optimization program which includes automated exposure control, adjustment of the mA and/or kV according to patient size and/or use of iterative reconstruction technique. CONTRAST:  4m OMNIPAQUE IOHEXOL 350 MG/ML SOLN COMPARISON:  None Available. FINDINGS: Lower chest: Mild right basilar atelectasis. Cardiac size within normal limits. Known extensive pulmonary emboli are  better seen on accompanying CT examination of the chest. Hepatobiliary: Tiny cyst within the right hepatic lobe. No enhancing intrahepatic mass. Status post cholecystectomy. No intra or extrahepatic biliary ductal dilation. Pancreas: Unremarkable. No pancreatic ductal dilatation or surrounding inflammatory changes. Spleen: Normal in size without focal abnormality. Adrenals/Urinary Tract: The adrenal glands are unremarkable. The kidneys are normal in size and position. Multiple simple and mildly complex renal cortical cysts are seen bilaterally, better evaluated on prior MRI examination of 12/29/2018. No follow-up imaging is recommended for these lesions. No enhancing intrarenal masses identified. No hydronephrosis. No intrarenal or ureteral calculi. No perinephric inflammatory stranding or fluid collections are identified. The bladder is unremarkable. Stomach/Bowel: Status post right hemicolectomy. The residual stomach, small bowel, and large bowel are unremarkable. No free intraperitoneal gas or fluid. Vascular/Lymphatic: Aortic atherosclerosis. No enlarged abdominal or pelvic lymph nodes. Reproductive: Uterus and bilateral adnexa are unremarkable. Other: No abdominal wall hernia or abnormality. No abdominopelvic ascites. Musculoskeletal: Status post left total hip arthroplasty. No acute bone abnormality. Degenerative changes are seen within the lumbar spine. IMPRESSION: 1. No acute intra-abdominal pathology identified. 2. Known extensive pulmonary emboli are better seen on accompanying CT examination of the chest. 3. Status post right hemicolectomy. 4. Aortic atherosclerosis. Aortic Atherosclerosis (ICD10-I70.0). Electronically Signed   By: AFidela SalisburyM.D.   On: 05/29/2022 20:37   DG Chest 2 View  Result Date: 05/29/2022 CLINICAL DATA:  Right flank pain and nausea. EXAM: CHEST - 2 VIEW COMPARISON:  April 13, 2022 FINDINGS: The heart size and mediastinal contours are within normal limits. Both lungs are  clear. Degenerative changes are seen involving both shoulders, left greater than right. IMPRESSION: No active cardiopulmonary disease. Electronically Signed   By: Virgina Norfolk M.D.   On: 05/29/2022 17:55    Scheduled Meds:  allopurinol  100 mg Oral q AM   aspirin EC  81 mg Oral Daily   feeding supplement (GLUCERNA SHAKE)  237 mL Oral TID BM   insulin aspart  0-5 Units Subcutaneous QHS   insulin aspart  0-9 Units Subcutaneous TID WC   insulin glargine-yfgn  10 Units Subcutaneous QHS   pantoprazole  40 mg Oral Daily   Continuous Infusions:  heparin 950 Units/hr (05/30/22 0645)     LOS: 0 days    Time spent: 50 mins    Imanni Burdine, MD [facility]   If 7PM-7AM, please contact night-coverage

## 2022-05-30 NOTE — Progress Notes (Signed)
  Transition of Care [facility]) Screening Note   Patient Details  Name: Traci Clark Date of Birth: May 12, 1931   Transition of Care [facility]) CM/SW Contact:    Ihor Gully, LCSW Phone Number: 05/30/2022, 11:29 AM    Transition of Care Department [facility]) has reviewed patient and no TOC needs have been identified at this time. We will continue to monitor patient advancement through interdisciplinary progression rounds. If new patient transition needs arise, please place a TOC consult.

## 2022-05-30 NOTE — Progress Notes (Signed)
[place] for Heparin Indication: pulmonary embolus Brief A/P: Heparin level supratherapeutic Decrease Heparin rate  Allergies  Allergen Reactions   Amoxil [Amoxicillin] Anaphylaxis and Rash    "Skin peeled off"   Macrobid [Nitrofurantoin Monohyd Macro] Anaphylaxis, Rash and Other (See Comments)    "Skin peeled off"   Sulfa Antibiotics Other (See Comments)    Thrush and itching   Pravachol [Pravastatin] Other (See Comments)    MYALGIAS  MYALGIAS  MYALGIAS  MYALGIAS   Codeine Nausea And Vomiting and Other (See Comments)    shaking   Darvon [Propoxyphene] Nausea And Vomiting and Other (See Comments)    shaking   Mirtazapine Other (See Comments)    Does not tolerate   Neurontin [Gabapentin] Nausea Only    Feel faint   Phenergan [Promethazine] Other (See Comments)    altered mental changes   Rocephin [Ceftriaxone] Other (See Comments)    Yeast infection   Hydrocodone-Acetaminophen Nausea And Vomiting and Other (See Comments)    Causes chest to feel heavy      Patient Measurements: Height: '5\' 6"'$  (167.6 cm) Weight: 67.1 kg (147 lb 14.9 oz) IBW/kg (Calculated) : 59.3 Heparin Dosing Weight: 68 kg  Vital Signs: Temp: 98.5 F (36.9 C) (01/11 0607) Temp Source: Oral (01/11 0607) BP: 123/63 (01/11 0607) Pulse Rate: 62 (01/11 0607)  Labs: Recent Labs    05/29/22 1817 05/29/22 2159 05/30/22 0247 05/30/22 0545 05/30/22 1406  HGB 11.6*  --  11.1*  --   --   HCT 35.3*  --  32.7*  --   --   PLT 356  --  312  --   --   APTT  --  34  --   --   --   LABPROT  --  15.3*  --   --   --   INR  --  1.2  --   --   --   HEPARINUNFRC  --   --   --  0.77* 0.58  CREATININE 1.41*  --  1.29*  --   --      Estimated Creatinine Clearance: 27.7 mL/min (A) (by C-G formula based on SCr of 1.29 mg/dL (H)).   Assessment: 89 y.o. female with PE for heparin  HL 0.58, therapeutic. No issues with infusion  Goal of Therapy:  Heparin level 0.3-0.7  units/ml Monitor platelets by anticoagulation protocol: Yes   Plan:  Continue heparin at 950 units/hr Check anti-Xa level in 8 hours and daily while on heparin Continue to monitor H&H and platelets  Isac Sarna, BS Pharm D, BCPS Clinical Pharmacist

## 2022-05-30 NOTE — Progress Notes (Incomplete)
*  PRELIMINARY RESULTS* Echocardiogram 2D Echocardiogram has been performed.  Traci Clark 05/30/2022, 10:35 AM

## 2022-05-30 NOTE — Care Management Obs Status (Signed)
[place] NOTIFICATION   Patient Details  Name: Traci Clark MRN: [id number] Date of Birth: 1930/08/31   Medicare Observation Status Notification Given:  Yes    [name] 05/30/2022, 4:48 PM

## 2022-05-31 ENCOUNTER — Other Ambulatory Visit (HOSPITAL_COMMUNITY): Payer: Self-pay

## 2022-05-31 DIAGNOSIS — I2699 Other pulmonary embolism without acute cor pulmonale: Secondary | ICD-10-CM | POA: Diagnosis not present

## 2022-05-31 LAB — CBC
HCT: 32.6 % — ABNORMAL LOW (ref 36.0–46.0)
Hemoglobin: 10.8 g/dL — ABNORMAL LOW (ref 12.0–15.0)
MCH: 28.4 pg (ref 26.0–34.0)
MCHC: 33.1 g/dL (ref 30.0–36.0)
MCV: 85.8 fL (ref 80.0–100.0)
Platelets: 332 10*3/uL (ref 150–400)
RBC: 3.8 MIL/uL — ABNORMAL LOW (ref 3.87–5.11)
RDW: 13.5 % (ref 11.5–15.5)
WBC: 8.3 10*3/uL (ref 4.0–10.5)
nRBC: 0 % (ref 0.0–0.2)

## 2022-05-31 LAB — HEPARIN LEVEL (UNFRACTIONATED): Heparin Unfractionated: 0.53 IU/mL (ref 0.30–0.70)

## 2022-05-31 LAB — GLUCOSE, CAPILLARY: Glucose-Capillary: 159 mg/dL — ABNORMAL HIGH (ref 70–99)

## 2022-05-31 LAB — HOMOCYSTEINE: Homocysteine: 17.9 umol/L (ref 0.0–21.3)

## 2022-05-31 MED ORDER — APIXABAN 5 MG PO TABS: 5.0000 mg | ORAL_TABLET | Freq: Two times a day (BID) | ORAL | Status: DC

## 2022-05-31 MED ORDER — APIXABAN 5 MG PO TABS
10.0000 mg | ORAL_TABLET | Freq: Two times a day (BID) | ORAL | Status: DC
  Administered 2022-05-31: 10 mg via ORAL
  Filled 2022-05-31: qty 2

## 2022-05-31 MED ORDER — APIXABAN 5 MG PO TABS: ORAL_TABLET | ORAL | 6 refills | Status: DC

## 2022-05-31 NOTE — Evaluation (Signed)
Physical Therapy Evaluation Patient Details Name: Traci Clark MRN: [id number] DOB: 09-02-1930 Today's Date: 05/31/2022  History of Present Illness  Traci Clark is a 89 y.o. female with medical history significant of hypertension, T2DM, GERD, constipation, gout and recent endoscopy significant for candidal esophagitis for which she has started on oral Diflucan.  Patient went to her PCP due to about 1 week onset of right-sided chest/rib cage intermittent pain which was worse with movements and taking deep breath and was associated with some nausea without vomiting.  She was then asked to go to the ER for further evaluation and management.  Patient was recently treated for UTI around Christmas time.  She denies fever, chills, headache, palpitations, abdominal pain, vomiting or diarrhea.  Patient lives with grandson.   Clinical Impression  Patient demonstrates good return for sitting up at bedside, but required repeated attempts before able to complete sit to stand due to c/o dizziness.  Patient's BP at 135/67 when sitting, slight decrease to 108/70 when standing and c/o dizziness resolved after standing for 4-5 minutes.  Patient demonstrates good return for ambulating in room/hallway without loss of balance, limited mostly due to fatigue and tolerated sitting up in chair with family members present in room - nursing staff notified.  PLAN:  Patient to be discharged home today and discharged from acute physical therapy to care of nursing for ambulation as tolerated for length of stay with recommendations stated below        Recommendations for follow up therapy are one component of a multi-disciplinary discharge planning process, led by the attending physician.  Recommendations may be updated based on patient status, additional functional criteria and insurance authorization.  Follow Up Recommendations Home health PT      Assistance Recommended at Discharge Set up Supervision/Assistance   Patient can return home with the following  A little help with walking and/or transfers;A little help with bathing/dressing/bathroom;Help with stairs or ramp for entrance;Assistance with cooking/housework    Equipment Recommendations None recommended by PT  Recommendations for Other Services       Functional Status Assessment Patient has had a recent decline in their functional status and demonstrates the ability to make significant improvements in function in a reasonable and predictable amount of time.     Precautions / Restrictions Precautions Precautions: Fall Restrictions Weight Bearing Restrictions: No      Mobility  Bed Mobility Overal bed mobility: Needs Assistance Bed Mobility: Supine to Sit     Supine to sit: Supervision     General bed mobility comments: increased time, labored movement    Transfers Overall transfer level: Needs assistance Equipment used: Rolling walker (2 wheels) Transfers: Sit to/from Stand, Bed to chair/wheelchair/BSC Sit to Stand: Supervision, Min guard   Step pivot transfers: Supervision, Min guard       General transfer comment: increased time, labored movement, verbal cueing for proper hand placement during sit to stands with good carryover demonstrated    Ambulation/Gait Ambulation/Gait assistance: Min guard, Supervision Gait Distance (Feet): 45 Feet Assistive device: Rolling walker (2 wheels) Gait Pattern/deviations: Decreased step length - right, Decreased step length - left, Decreased stride length Gait velocity: decreased     General Gait Details: slow labored cadence without loss of balance and limited mostly due to c/o fatigue  Stairs            Wheelchair Mobility    Modified Rankin (Stroke Patients Only)       Balance Overall balance assessment: Needs assistance Sitting-balance  support: Feet supported, No upper extremity supported Sitting balance-Leahy Scale: Good Sitting balance - Comments: seated at  EOB   Standing balance support: During functional activity, Bilateral upper extremity supported Standing balance-Leahy Scale: Fair Standing balance comment: using RW                             Pertinent Vitals/Pain Pain Assessment Pain Assessment: No/denies pain    Home Living Family/patient expects to be discharged to:: Private residence Living Arrangements: Other relatives Available Help at Discharge: Family;Available 24 hours/day Type of Home: House Home Access: Level entry       Home Layout: One level Home Equipment: Shower seat;Cane - single point;Rolling Walker (2 wheels);Rollator (4 wheels);Wheelchair - manual;Grab bars - tub/shower      Prior Function Prior Level of Function : Needs assist       Physical Assist : Mobility (physical) Mobility (physical): Gait;Bed mobility;Transfers;Stairs   Mobility Comments: household ambulator using RW ADLs Comments: Needs assist with IADL's     Hand Dominance   Dominant Hand: Right    Extremity/Trunk Assessment   Upper Extremity Assessment Upper Extremity Assessment: Generalized weakness    Lower Extremity Assessment Lower Extremity Assessment: Generalized weakness    Cervical / Trunk Assessment Cervical / Trunk Assessment: Normal  Communication   Communication: No difficulties  Cognition Arousal/Alertness: Awake/alert Behavior During Therapy: WFL for tasks assessed/performed Overall Cognitive Status: Within Functional Limits for tasks assessed                                          General Comments      Exercises     Assessment/Plan    PT Assessment All further PT needs can be met in the next venue of care  PT Problem List Decreased strength;Decreased activity tolerance;Decreased balance;Decreased mobility       PT Treatment Interventions      PT Goals (Current goals can be found in the Care Plan section)  Acute Rehab PT Goals Patient Stated Goal: return home with  family to assist PT Goal Formulation: With patient/family Time For Goal Achievement: 05/31/22 Potential to Achieve Goals: Good    Frequency       Co-evaluation               AM-PAC PT "6 Clicks" Mobility  Outcome Measure Help needed turning from your back to your side while in a flat bed without using bedrails?: None Help needed moving from lying on your back to sitting on the side of a flat bed without using bedrails?: A Little Help needed moving to and from a bed to a chair (including a wheelchair)?: A Little Help needed standing up from a chair using your arms (e.g., wheelchair or bedside chair)?: A Little Help needed to walk in hospital room?: A Little Help needed climbing 3-5 steps with a railing? : A Lot 6 Click Score: 18    End of Session   Activity Tolerance: Patient tolerated treatment well;Patient limited by fatigue Patient left: in chair;with call bell/phone within reach;with family/visitor present Nurse Communication: Mobility status PT Visit Diagnosis: Unsteadiness on feet (R26.81);Other abnormalities of gait and mobility (R26.89);Muscle weakness (generalized) (M62.81)    Time: 2836-6294 PT Time Calculation (min) (ACUTE ONLY): 30 min   Charges:   PT Evaluation $PT Eval Moderate Complexity: 1 Mod PT Treatments $Therapeutic Activity: 23-37  mins        12:07 PM, 05/31/22 Lonell Grandchild, MPT Physical Therapist with [facility] [phone] office [phone] mobile phone

## 2022-05-31 NOTE — Progress Notes (Signed)
[facility] for Heparin Indication: pulmonary embolus  Allergies  Allergen Reactions   Amoxil [Amoxicillin] Anaphylaxis and Rash    "Skin peeled off"   Macrobid [Nitrofurantoin Monohyd Macro] Anaphylaxis, Rash and Other (See Comments)    "Skin peeled off"   Sulfa Antibiotics Other (See Comments)    Thrush and itching   Pravachol [Pravastatin] Other (See Comments)    MYALGIAS  MYALGIAS  MYALGIAS  MYALGIAS   Codeine Nausea And Vomiting and Other (See Comments)    shaking   Darvon [Propoxyphene] Nausea And Vomiting and Other (See Comments)    shaking   Mirtazapine Other (See Comments)    Does not tolerate   Neurontin [Gabapentin] Nausea Only    Feel faint   Phenergan [Promethazine] Other (See Comments)    altered mental changes   Rocephin [Ceftriaxone] Other (See Comments)    Yeast infection   Hydrocodone-Acetaminophen Nausea And Vomiting and Other (See Comments)    Causes chest to feel heavy      Patient Measurements: Height: '5\' 6"'$  (167.6 cm) Weight: 67.1 kg (147 lb 14.9 oz) IBW/kg (Calculated) : 59.3 Heparin Dosing Weight: 68 kg  Vital Signs: Temp: 98.5 F (36.9 C) (01/12 0501) Temp Source: Oral (01/12 0501) BP: 108/52 (01/12 0501) Pulse Rate: 66 (01/12 0501)  Labs: Recent Labs    05/29/22 1817 05/29/22 2159 05/30/22 0247 05/30/22 0545 05/30/22 1406 05/30/22 2200 05/31/22 0319  HGB 11.6*  --  11.1*  --   --   --  10.8*  HCT 35.3*  --  32.7*  --   --   --  32.6*  PLT 356  --  312  --   --   --  332  APTT  --  34  --   --   --   --   --   LABPROT  --  15.3*  --   --   --   --   --   INR  --  1.2  --   --   --   --   --   HEPARINUNFRC  --   --   --    < > 0.58 0.43 0.53  CREATININE 1.41*  --  1.29*  --   --   --   --    < > = values in this interval not displayed.     Estimated Creatinine Clearance: 27.7 mL/min (A) (by C-G formula based on SCr of 1.29 mg/dL (H)).   Assessment: 89 y.o. female with PE for  heparin  HL 0.53- therapeutic Hgb 10.8  Goal of Therapy:  Heparin level 0.3-0.7 units/ml Monitor platelets by anticoagulation protocol: Yes   Plan:  Continue heparin at 950 units/hr Daily CBC and heparin level Monitor for bleeding  Margot Ables, PharmD Clinical Pharmacist 05/31/2022 7:49 AM

## 2022-05-31 NOTE — Progress Notes (Signed)
Alert and oriented x 4. Heparin drip was stopped and eliquis started.  IV removed and discharge instructions reviewed with patient and grandson.  Transported by WC to main entrance and grandson to drive home.

## 2022-05-31 NOTE — TOC Benefit Eligibility Note (Signed)
Patient Teacher, English as a foreign language completed.    The patient is currently admitted and upon discharge could be taking Eliquis 5 mg.  The current 30 day co-pay is $0.00.   The patient is insured through [place], [place] Patient Advocate Specialist [facility] Patient Advocate Team Direct Number: [phone]  Fax: [phone]

## 2022-05-31 NOTE — Discharge Summary (Signed)
Physician Discharge Summary  Traci Clark VQM:[id number] DOB: [birth year] DOA: 05/29/2022  PCP: Lavella Lemons, PA  Admit date: 05/29/2022  Discharge date: 05/31/2022  Admitted From: Home.  Disposition:  Home Health Services.  Recommendations for Outpatient Follow-up:  Follow up with PCP in 1-2 weeks. Please obtain BMP/CBC in one week. Advised to take Eliquis 10 mg twice daily for 7 days followed by Eliquis 5 mg twice daily for 6 to 9 months for pulmonary embolism.  [place] Equipment/Devices:None  Discharge Condition: Stable CODE STATUS:DNR Diet recommendation: Heart Healthy   Brief [facility] Hospital Course: This 89 years old female with PMH significant for hypertension, type 2 diabetes, GERD, constipation, gout, recent endoscopy significant for candidal esophagitis for which she has been taking oral Diflucan.Patient went to her PCP for right-sided chest, rib cage pain for about 1 week which is worse with movements and taking deep breath and was associated with some nausea without vomiting.She was told to come to the ED for evaluation.  In the ED she was hemodynamically stable. Workup reveals acute pulmonary embolism and started on IV heparin.  Patient was continued on IV heparin.  Echocardiogram shows LVEF 60 to 65%, no RWMA.  Venous duplex both lower extremities negative for DVT.  Patient feels better and wants to be discharged.  PT and OT recommended home health services.  Heparin discontinued and successfully transitioned with Eliquis.  Patient is being discharged home.    Discharge Diagnoses:  Principal Problem:   Pulmonary embolism (HCC) Active Problems:   GERD (gastroesophageal reflux disease)   Essential hypertension   Gout   Hyperglycemia due to diabetes mellitus (HCC)   Chronic kidney disease, stage 3b (HCC)  Acute pulmonary embolism: Patient presented with 1 week history of right-sided chest / rib cage pain which is worse with movement and  taking deep breath. CTA chest showed acute PE with moderate thrombosed body mostly in the right lung. Patient started on IV heparin, with the plan to transition to [place] before discharge. Bilateral lower extremity venous duplex completed, negative for DVT Echo shows LVEF 60 to 65%, no RWMA. Heparin discontinued and started on Eliquis.   Nausea: Continue Zofran as needed.   Type 2 diabetes with uncontrolled hyperglycemia: Hemoglobin A1c 7.4. Continue Semglee 30 units nightly and adjust dose accordingly. Continue regular insulin sliding scale.  Carb modified diet.   CKD stage IIIb: Serum creatinine at baseline.  Avoid nephrotoxic medications.   History of CVA: Continue aspirin.   GERD: Continue pantoprazole 40 mg daily.   Gout: Continue allopurinol   Essential hypertension: Patient is not on any antihypertensive medications.    Discharge Instructions  Discharge Instructions     Call MD for:  difficulty breathing, headache or visual disturbances   Complete by: As directed    Call MD for:  persistant dizziness or light-headedness   Complete by: As directed    Call MD for:  persistant nausea and vomiting   Complete by: As directed    Diet - low sodium heart healthy   Complete by: As directed    Diet Carb Modified   Complete by: As directed    Discharge instructions   Complete by: As directed    Advised to follow-up with primary care physician in 1 week. Advised to take Eliquis 10 mg twice daily for 7 days followed by 5 mg twice daily for 6 to 9 months for pulmonary embolism.   Increase activity slowly   Complete by: As directed  Allergies as of 05/31/2022       Reactions   Amoxil [amoxicillin] Anaphylaxis, Rash   "Skin peeled off"   Macrobid [nitrofurantoin Monohyd Macro] Anaphylaxis, Rash, Other (See Comments)   "Skin peeled off"   Sulfa Antibiotics Other (See Comments)   Thrush and itching   Pravachol [pravastatin] Other (See Comments)   MYALGIAS   MYALGIAS  MYALGIAS  MYALGIAS   Codeine Nausea And Vomiting, Other (See Comments)   shaking   Darvon [propoxyphene] Nausea And Vomiting, Other (See Comments)   shaking   Mirtazapine Other (See Comments)   Does not tolerate   Neurontin [gabapentin] Nausea Only   Feel faint   Phenergan [promethazine] Other (See Comments)   altered mental changes   Rocephin [ceftriaxone] Other (See Comments)   Yeast infection   Hydrocodone-acetaminophen Nausea And Vomiting, Other (See Comments)   Causes chest to feel heavy         Medication List     STOP taking these medications    ondansetron 4 MG tablet Commonly known as: ZOFRAN       TAKE these medications    albuterol 108 (90 Base) MCG/ACT inhaler Commonly known as: VENTOLIN HFA Inhale 1-2 puffs into the lungs every 6 (six) hours as needed for shortness of breath or wheezing. prn   allopurinol 100 MG tablet Commonly known as: ZYLOPRIM Take 100 mg by mouth in the morning.   ALPRAZolam 0.25 MG tablet Commonly known as: XANAX Take 1 tablet (0.25 mg total) by mouth 2 (two) times daily as needed for anxiety.   apixaban 5 MG Tabs tablet Commonly known as: ELIQUIS Advised to take Eliquis 10 mg twice daily for 7 days followed by 5 mg twice daily for 6 to 9 months for pulmonary embolism.   aspirin EC 81 MG tablet Take 1 tablet (81 mg total) by mouth daily. Swallow whole.   BASAGLAR KWIKPEN [place] Inject 15 Units into the skin at bedtime.   dexlansoprazole 60 MG capsule Commonly known as: DEXILANT Take 60 mg by mouth daily before breakfast.   fluticasone 50 MCG/ACT nasal spray Commonly known as: FLONASE Place 2 sprays into both nostrils daily as needed (allergies (cough/sneezing)).   furosemide 20 MG tablet Commonly known as: LASIX Take 20 mg by mouth in the morning.   latanoprost 0.005 % ophthalmic solution Commonly known as: XALATAN Place 1 drop into both eyes at bedtime.   meclizine 25 MG tablet Commonly known as:  ANTIVERT Take 1 tablet (25 mg total) by mouth 3 (three) times daily as needed for dizziness.   multivitamin tablet Take 1 tablet by mouth daily.   multivitamin-lutein Caps capsule Take 1 capsule by mouth daily.   NovoLOG FlexPen 100 UNIT/ML FlexPen Generic drug: insulin aspart Inject 4-10 Units into the skin 3 (three) times daily with meals. Per sliding scale   polyethylene glycol 17 g packet Commonly known as: MIRALAX / GLYCOLAX Take 34 g by mouth in the morning.   potassium chloride 10 MEQ CR capsule Commonly known as: MICRO-K Take 10 mEq by mouth in the morning.   traMADol 50 MG tablet Commonly known as: ULTRAM Take 1 tablet (50 mg total) by mouth every 6 (six) hours as needed for severe pain.   triamcinolone cream 0.1 % Commonly known as: KENALOG Apply 1 Application topically 2 (two) times daily.   Trulance 3 MG Tabs Generic drug: Plecanatide Take 3 mg by mouth daily.   TYLENOL 500 MG tablet Generic drug: acetaminophen Take 1,000 mg by mouth  every 6 (six) hours as needed (pain.). GELCAPS        Follow-up Information     Lavella Lemons, PA Follow up in 1 week(s).   Specialty: Physician Assistant Contact information: [place] [address] [phone]                Allergies  Allergen Reactions   Amoxil [Amoxicillin] Anaphylaxis and Rash    "Skin peeled off"   Macrobid [Nitrofurantoin Monohyd Macro] Anaphylaxis, Rash and Other (See Comments)    "Skin peeled off"   Sulfa Antibiotics Other (See Comments)    Thrush and itching   Pravachol [Pravastatin] Other (See Comments)    MYALGIAS  MYALGIAS  MYALGIAS  MYALGIAS   Codeine Nausea And Vomiting and Other (See Comments)    shaking   Darvon [Propoxyphene] Nausea And Vomiting and Other (See Comments)    shaking   Mirtazapine Other (See Comments)    Does not tolerate   Neurontin [Gabapentin] Nausea Only    Feel faint   Phenergan [Promethazine] Other (See Comments)    altered  mental changes   Rocephin [Ceftriaxone] Other (See Comments)    Yeast infection   Hydrocodone-Acetaminophen Nausea And Vomiting and Other (See Comments)    Causes chest to feel heavy      Consultations: None   Procedures/Studies: ECHOCARDIOGRAM COMPLETE  Result Date: 05/30/2022    ECHOCARDIOGRAM REPORT   Patient Name:   SHAKEDA PEARSE Iversen Date of Exam: 05/30/2022 Medical Rec #:  [id number]          Height:       66.0 in Accession #:    [id number]         Weight:       147.9 lb Date of Birth:  [birth year]          BSA:          1.759 m Patient Age:    89 years           BP:           123/63 mmHg Patient Gender: F                  HR:           67 bpm. Exam Location:  [facility] Procedure: 2D Echo, Cardiac Doppler and Color Doppler Indications:    Pulmonary embolus  History:        Patient has prior history of Echocardiogram examinations, most                 recent 09/14/2020. Previous Myocardial Infarction,                 Signs/Symptoms:Altered Mental Status and Chest Pain; Risk                 Factors:Hypertension and Diabetes. Pulm Embolus, Colon CA.  Sonographer:    Wenda Low Referring Phys: [id number] OLADAPO ADEFESO IMPRESSIONS  1. Left ventricular ejection fraction, by estimation, is 55 to 60%. The left ventricle has normal function. The left ventricle has no regional wall motion abnormalities. There is moderate left ventricular hypertrophy. Left ventricular diastolic parameters are consistent with Grade I diastolic dysfunction (impaired relaxation).  2. Right ventricular systolic function poorly visualized but appears to be normal. The right ventricular size is normal.  3. The mitral valve is grossly normal. No evidence of mitral valve regurgitation. No evidence of mitral stenosis.  4. The aortic valve is tricuspid. There is mild  calcification of the aortic valve. Aortic valve regurgitation is not visualized. No aortic stenosis is present. Comparison(s): No significant change from prior study.  FINDINGS  Left Ventricle: Left ventricular ejection fraction, by estimation, is 55 to 60%. The left ventricle has normal function. The left ventricle has no regional wall motion abnormalities. The left ventricular internal cavity size was normal in size. There is  moderate left ventricular hypertrophy. Left ventricular diastolic parameters are consistent with Grade I diastolic dysfunction (impaired relaxation). Right Ventricle: The right ventricular size is normal. No increase in right ventricular wall thickness. Right ventricular systolic function poorly visualized but appears to be normal. Left Atrium: Left atrial size was normal in size. Right Atrium: Right atrial size was normal in size. Pericardium: There is no evidence of pericardial effusion. Mitral Valve: The mitral valve is grossly normal. No evidence of mitral valve regurgitation. No evidence of mitral valve stenosis. MV peak gradient, 3.4 mmHg. The mean mitral valve gradient is 1.0 mmHg. Tricuspid Valve: The tricuspid valve is not well visualized. Tricuspid valve regurgitation is not demonstrated. No evidence of tricuspid stenosis. Aortic Valve: The aortic valve is tricuspid. There is mild calcification of the aortic valve. Aortic valve regurgitation is not visualized. No aortic stenosis is present. Aortic valve mean gradient measures 3.0 mmHg. Aortic valve peak gradient measures 5.4 mmHg. Aortic valve area, by VTI measures 2.30 cm. Pulmonic Valve: The pulmonic valve was not well visualized. Pulmonic valve regurgitation is trivial. No evidence of pulmonic stenosis. Aorta: The aortic root is normal in size and structure. Venous: The inferior vena cava was not well visualized. IAS/Shunts: No atrial level shunt detected by color flow Doppler.  LEFT VENTRICLE PLAX 2D LVIDd:         4.30 cm   Diastology LVIDs:         2.50 cm   LV e' medial:    4.13 cm/s LV PW:         1.20 cm   LV E/e' medial:  11.6 LV IVS:        1.50 cm   LV e' lateral:   7.07 cm/s LVOT  diam:     2.00 cm   LV E/e' lateral: 6.8 LV SV:         61 LV SV Index:   34 LVOT Area:     3.14 cm  RIGHT VENTRICLE RV Basal diam:  3.05 cm RV Mid diam:    3.00 cm RV S prime:     9.90 cm/s TAPSE (M-mode): 2.6 cm LEFT ATRIUM             Index        RIGHT ATRIUM           Index LA diam:        3.40 cm 1.93 cm/m   RA Area:     14.40 cm LA Vol (A2C):   42.1 ml 23.93 ml/m  RA Volume:   38.40 ml  21.83 ml/m LA Vol (A4C):   20.2 ml 11.48 ml/m LA Biplane Vol: 30.2 ml 17.17 ml/m  AORTIC VALVE                    PULMONIC VALVE AV Area (Vmax):    2.74 cm     PV Vmax:       1.10 m/s AV Area (Vmean):   2.33 cm     PV Peak grad:  4.8 mmHg AV Area (VTI):     2.30 cm AV Vmax:  116.00 cm/s AV Vmean:          82.600 cm/s AV VTI:            0.264 m AV Peak Grad:      5.4 mmHg AV Mean Grad:      3.0 mmHg LVOT Vmax:         101.00 cm/s LVOT Vmean:        61.200 cm/s LVOT VTI:          0.193 m LVOT/AV VTI ratio: 0.73  AORTA Ao Root diam: 3.20 cm MITRAL VALVE               TRICUSPID VALVE MV Area (PHT): 4.93 cm    TR Peak grad:   19.7 mmHg MV Area VTI:   3.28 cm    TR Vmax:        222.00 cm/s MV Peak grad:  3.4 mmHg MV Mean grad:  1.0 mmHg    SHUNTS MV Vmax:       0.92 m/s    Systemic VTI:  0.19 m MV Vmean:      41.3 cm/s   Systemic Diam: 2.00 cm MV Decel Time: 154 msec MV E velocity: 48.00 cm/s MV A velocity: 95.10 cm/s MV E/A ratio:  0.50 Vishnu Priya Mallipeddi Electronically signed by Lorelee Cover Mallipeddi Signature Date/Time: 05/30/2022/3:52:02 PM    Final    US Venous Img Lower Bilateral (DVT)  Result Date: 05/30/2022 CLINICAL DATA:  Pulmonary embolism. EXAM: BILATERAL LOWER EXTREMITY VENOUS DOPPLER ULTRASOUND TECHNIQUE: Gray-scale sonography with compression, as well as color and duplex ultrasound, were performed to evaluate the deep venous system(s) from the level of the common femoral vein through the popliteal and proximal calf veins. COMPARISON:  None Available. FINDINGS: VENOUS Normal  compressibility of the common femoral, superficial femoral, and popliteal veins, as well as the visualized calf veins. Visualized portions of profunda femoral vein and great saphenous vein unremarkable. No filling defects to suggest DVT on grayscale or color Doppler imaging. Doppler waveforms show normal direction of venous flow, normal respiratory plasticity and response to augmentation. OTHER None. Limitations: none IMPRESSION: Negative for lower extremity DVT. Electronically Signed   By: Emmit Alexanders M.D   On: 05/30/2022 09:59   CT Angio Chest PE W and/or Wo Contrast  Result Date: 05/29/2022 CLINICAL DATA:  High clinical suspicion for PE, right flank pain, nausea, recent UTI EXAM: CT ANGIOGRAPHY CHEST WITH CONTRAST TECHNIQUE: Multidetector CT imaging of the chest was performed using the standard protocol during bolus administration of intravenous contrast. Multiplanar CT image reconstructions and MIPs were obtained to evaluate the vascular anatomy. RADIATION DOSE REDUCTION: This exam was performed according to the departmental dose-optimization program which includes automated exposure control, adjustment of the mA and/or kV according to patient size and/or use of iterative reconstruction technique. CONTRAST:  85m OMNIPAQUE IOHEXOL 350 MG/ML SOLN COMPARISON:  Previous studies including the chest radiograph done earlier today FINDINGS: Cardiovascular: There is homogeneous enhancement in thoracic aorta. Acute PE is seen with moderate thrombus burden in both lungs, more so in the right lung. Filling defects are seen in the right upper lobe and right lower lobe pulmonary arteries extending into multiple segmental and subsegmental branches. There is small filling defect in left lower lobe pulmonary artery extending into few segmental branches. RV LV ratio is 0.93. Scattered coronary artery calcifications are seen. Mediastinum/Nodes: No significant lymphadenopathy seen. There is frothy density in the lumen of  thoracic esophagus suggesting possible gastroesophageal reflux. Lungs/Pleura: Small  patchy infiltrates are seen in right lower lobe. This may suggest pneumonia or developing pulmonary infarction. Small linear densities are seen in both lower lung fields suggesting subsegmental atelectasis. There is minimal right pleural effusion. There is no pneumothorax. Upper Abdomen: No acute findings are seen. Musculoskeletal: Degenerative changes are noted in both shoulders, more so on the left side. Review of the MIP images confirms the above findings. IMPRESSION: Acute PE with moderate thrombus burden, more so in the right lung. There are patchy infiltrates in right lower lung field suggesting atelectasis/pneumonia and possibly evolving infarction. Small right pleural effusion is seen. Scattered coronary artery calcifications are seen. Degenerative changes are noted in both shoulders, more severe in the left shoulder. Possible gastroesophageal reflux. Other findings as described in the body of the report. Imaging finding of acute PE was relayed to patient's provider Dr. Alvino Chapel by telephone call. Electronically Signed   By: Elmer Picker M.D.   On: 05/29/2022 20:46   CT ABDOMEN PELVIS W CONTRAST  Result Date: 05/29/2022 CLINICAL DATA:  Abdominal/flank pain, stone suspected. Right flank pain, nausea EXAM: CT ABDOMEN AND PELVIS WITH CONTRAST TECHNIQUE: Multidetector CT imaging of the abdomen and pelvis was performed using the standard protocol following bolus administration of intravenous contrast. RADIATION DOSE REDUCTION: This exam was performed according to the departmental dose-optimization program which includes automated exposure control, adjustment of the mA and/or kV according to patient size and/or use of iterative reconstruction technique. CONTRAST:  62m OMNIPAQUE IOHEXOL 350 MG/ML SOLN COMPARISON:  None Available. FINDINGS: Lower chest: Mild right basilar atelectasis. Cardiac size within normal limits.  Known extensive pulmonary emboli are better seen on accompanying CT examination of the chest. Hepatobiliary: Tiny cyst within the right hepatic lobe. No enhancing intrahepatic mass. Status post cholecystectomy. No intra or extrahepatic biliary ductal dilation. Pancreas: Unremarkable. No pancreatic ductal dilatation or surrounding inflammatory changes. Spleen: Normal in size without focal abnormality. Adrenals/Urinary Tract: The adrenal glands are unremarkable. The kidneys are normal in size and position. Multiple simple and mildly complex renal cortical cysts are seen bilaterally, better evaluated on prior MRI examination of 12/29/2018. No follow-up imaging is recommended for these lesions. No enhancing intrarenal masses identified. No hydronephrosis. No intrarenal or ureteral calculi. No perinephric inflammatory stranding or fluid collections are identified. The bladder is unremarkable. Stomach/Bowel: Status post right hemicolectomy. The residual stomach, small bowel, and large bowel are unremarkable. No free intraperitoneal gas or fluid. Vascular/Lymphatic: Aortic atherosclerosis. No enlarged abdominal or pelvic lymph nodes. Reproductive: Uterus and bilateral adnexa are unremarkable. Other: No abdominal wall hernia or abnormality. No abdominopelvic ascites. Musculoskeletal: Status post left total hip arthroplasty. No acute bone abnormality. Degenerative changes are seen within the lumbar spine. IMPRESSION: 1. No acute intra-abdominal pathology identified. 2. Known extensive pulmonary emboli are better seen on accompanying CT examination of the chest. 3. Status post right hemicolectomy. 4. Aortic atherosclerosis. Aortic Atherosclerosis (ICD10-I70.0). Electronically Signed   By: AFidela SalisburyM.D.   On: 05/29/2022 20:37   DG Chest 2 View  Result Date: 05/29/2022 CLINICAL DATA:  Right flank pain and nausea. EXAM: CHEST - 2 VIEW COMPARISON:  April 13, 2022 FINDINGS: The heart size and mediastinal contours are  within normal limits. Both lungs are clear. Degenerative changes are seen involving both shoulders, left greater than right. IMPRESSION: No active cardiopulmonary disease. Electronically Signed   By: TVirgina NorfolkM.D.   On: 05/29/2022 17:55     Subjective: Patient was seen and examined at bedside.  Overnight events noted.   Patient  report doing much better and wants to be discharged.   Patient has ambulated with support.  Home health services arranged.   Patient is being discharged home on Eliquis.  Discharge Exam: Vitals:   05/30/22 2037 05/31/22 0501  BP: 132/70 (!) 108/52  Pulse: 71 66  Resp: 20 18  Temp: 99.9 F (37.7 C) 98.5 F (36.9 C)  SpO2: 95% 97%   Vitals:   05/30/22 0607 05/30/22 1621 05/30/22 2037 05/31/22 0501  BP: 123/63 136/77 132/70 (!) 108/52  Pulse: 62 (!) 58 71 66  Resp: '18 17 20 18  '$ Temp: 98.5 F (36.9 C) 98.7 F (37.1 C) 99.9 F (37.7 C) 98.5 F (36.9 C)  TempSrc: Oral Oral Oral Oral  SpO2: 96% 98% 95% 97%  Weight: 67.1 kg     Height: '5\' 6"'$  (1.676 m)       General: Pt is alert, awake, not in acute distress Cardiovascular: RRR, S1/S2 +, no rubs, no gallops Respiratory: CTA bilaterally, no wheezing, no rhonchi Abdominal: Soft, NT, ND, bowel sounds + Extremities: no edema, no cyanosis    The results of significant diagnostics from this hospitalization (including imaging, microbiology, ancillary and laboratory) are listed below for reference.     Microbiology: No results found for this or any previous visit (from the past 240 hour(s)).   Labs: BNP (last 3 results) No results for input(s): "BNP" in the last 8760 hours. Basic Metabolic Panel: Recent Labs  Lab 05/29/22 1817 05/30/22 0247  NA 132* 133*  K 4.2 4.0  CL 97* 97*  CO2 27 26  GLUCOSE 214* 187*  BUN 16 15  CREATININE 1.41* 1.29*  CALCIUM 9.3 9.3  MG  --  2.0  PHOS  --  3.6   Liver Function Tests: Recent Labs  Lab 05/29/22 1817 05/30/22 0247  AST 31 26  ALT 19 16   ALKPHOS 106 97  BILITOT 0.3 0.6  PROT 7.4 6.3*  ALBUMIN 3.0* 2.7*   Recent Labs  Lab 05/29/22 1817  LIPASE 38   No results for input(s): "AMMONIA" in the last 168 hours. CBC: Recent Labs  Lab 05/29/22 1817 05/30/22 0247 05/31/22 0319  WBC 10.0 8.7 8.3  NEUTROABS 6.3  --   --   HGB 11.6* 11.1* 10.8*  HCT 35.3* 32.7* 32.6*  MCV 87.8 85.8 85.8  PLT 356 312 332   Cardiac Enzymes: No results for input(s): "CKTOTAL", "CKMB", "CKMBINDEX", "TROPONINI" in the last 168 hours. BNP: Invalid input(s): "POCBNP" CBG: Recent Labs  Lab 05/30/22 0748 05/30/22 1126 05/30/22 1627 05/30/22 2040 05/31/22 0720  GLUCAP 168* 225* 194* 228* 159*   D-Dimer No results for input(s): "DDIMER" in the last 72 hours. Hgb A1c No results for input(s): "HGBA1C" in the last 72 hours. Lipid Profile No results for input(s): "CHOL", "HDL", "LDLCALC", "TRIG", "CHOLHDL", "LDLDIRECT" in the last 72 hours. Thyroid function studies No results for input(s): "TSH", "T4TOTAL", "T3FREE", "THYROIDAB" in the last 72 hours.  Invalid input(s): "FREET3" Anemia work up No results for input(s): "VITAMINB12", "FOLATE", "FERRITIN", "TIBC", "IRON", "RETICCTPCT" in the last 72 hours. Urinalysis    Component Value Date/Time   COLORURINE STRAW (A) 05/29/2022 1525   APPEARANCEUR CLEAR 05/29/2022 1525   LABSPEC 1.008 05/29/2022 1525   PHURINE 6.0 05/29/2022 1525   GLUCOSEU NEGATIVE 05/29/2022 1525   HGBUR NEGATIVE 05/29/2022 [place] 05/29/2022 1525   KETONESUR NEGATIVE 05/29/2022 1525   PROTEINUR NEGATIVE 05/29/2022 1525   UROBILINOGEN 0.2 06/27/2014 1044   NITRITE NEGATIVE 05/29/2022  [place] 05/29/2022 1525   Sepsis Labs Recent Labs  Lab 05/29/22 1817 05/30/22 0247 05/31/22 0319  WBC 10.0 8.7 8.3   Microbiology No results found for this or any previous visit (from the past 240 hour(s)).   Time coordinating discharge: Over 30 minutes  SIGNED:   Shawna Clamp, MD  [facility] 05/31/2022, 2:01 PM Pager   If 7PM-7AM, please contact night-coverage

## 2022-05-31 NOTE — Discharge Instructions (Addendum)
Information on my medicine - ELIQUIS (apixaban)  This medication education was reviewed with me or my healthcare representative as part of my discharge preparation.    Why was Eliquis prescribed for you? Eliquis was prescribed to treat blood clots that may have been found in the veins of your legs (deep vein thrombosis) or in your lungs (pulmonary embolism) and to reduce the risk of them occurring again.  What do You need to know about Eliquis ? The starting dose is 10 mg (two 5 mg tablets) taken TWICE daily for the FIRST SEVEN (7) DAYS, then on 06/07/22  the dose is reduced to ONE 5 mg tablet taken TWICE daily.  Eliquis may be taken with or without food.   Try to take the dose about the same time in the morning and in the evening. If you have difficulty swallowing the tablet whole please discuss with your pharmacist how to take the medication safely.  Take Eliquis exactly as prescribed and DO NOT stop taking Eliquis without talking to the doctor who prescribed the medication.  Stopping may increase your risk of developing a new blood clot.  Refill your prescription before you run out.  After discharge, you should have regular check-up appointments with your healthcare provider that is prescribing your Eliquis.    What do you do if you miss a dose? If a dose of ELIQUIS is not taken at the scheduled time, take it as soon as possible on the same day and twice-daily administration should be resumed. The dose should not be doubled to make up for a missed dose.  Important Safety Information A possible side effect of Eliquis is bleeding. You should call your healthcare provider right away if you experience any of the following: Bleeding from an injury or your nose that does not stop. Unusual colored urine (red or dark brown) or unusual colored stools (red or black). Unusual bruising for unknown reasons. A serious fall or if you hit your head (even if there is no bleeding).  Some  medicines may interact with Eliquis and might increase your risk of bleeding or clotting while on Eliquis. To help avoid this, consult your healthcare provider or pharmacist prior to using any new prescription or non-prescription medications, including herbals, vitamins, non-steroidal anti-inflammatory drugs (NSAIDs) and supplements.  This website has more information on Eliquis (apixaban): http://www.eliquis.com/eliquis/home

## 2022-05-31 NOTE — Progress Notes (Signed)
Spoke with patient's grandson, Traci Clark, and discussed HHPT services. He identified that patient is active with [facility] currently.    Katiya Fike, Clydene Pugh, LCSW

## 2022-06-01 LAB — CARDIOLIPIN ANTIBODIES, IGG, IGM, IGA
Anticardiolipin IgA: <9 APL U/mL (ref 0–11)
Anticardiolipin IgG: 21 GPL U/mL — ABNORMAL HIGH (ref 0–14)
Anticardiolipin IgM: 10 MPL U/mL (ref 0–12)

## 2022-06-02 LAB — PTT-LA MIX: PTT-LA Mix: 52.2 s — ABNORMAL HIGH (ref 0.0–40.5)

## 2022-06-02 LAB — PROTEIN S ACTIVITY: Protein S Activity: 76 % (ref 63–140)

## 2022-06-02 LAB — PROTEIN C, TOTAL: Protein C, Total: 59 % — ABNORMAL LOW (ref 60–150)

## 2022-06-02 LAB — LUPUS ANTICOAGULANT PANEL
DRVVT: 75.8 s — ABNORMAL HIGH (ref 0.0–47.0)
PTT Lupus Anticoagulant: 52.1 s — ABNORMAL HIGH (ref 0.0–43.5)

## 2022-06-02 LAB — BETA-2-GLYCOPROTEIN I ABS, IGG/M/A
Beta-2 Glyco I IgG: <9 GPI IgG units (ref 0–20)
Beta-2-Glycoprotein I IgA: <9 GPI IgA units (ref 0–25)
Beta-2-Glycoprotein I IgM: <9 GPI IgM units (ref 0–32)

## 2022-06-02 LAB — DRVVT CONFIRM: dRVVT Confirm: 1.3 ratio — ABNORMAL HIGH (ref 0.8–1.2)

## 2022-06-02 LAB — PROTEIN S, TOTAL: Protein S Ag, Total: 139 % (ref 60–150)

## 2022-06-02 LAB — PROTEIN C ACTIVITY: Protein C Activity: 94 % (ref 73–180)

## 2022-06-02 LAB — HEXAGONAL PHASE PHOSPHOLIPID: Hexagonal Phase Phospholipid: 4 s (ref 0–11)

## 2022-06-02 LAB — DRVVT MIX: dRVVT Mix: 54.2 s — ABNORMAL HIGH (ref 0.0–40.4)

## 2022-06-05 LAB — FACTOR 5 LEIDEN

## 2022-06-10 ENCOUNTER — Telehealth: Payer: Self-pay | Admitting: Cardiology

## 2022-06-10 LAB — PROTHROMBIN GENE MUTATION

## 2022-06-11 ENCOUNTER — Telehealth: Payer: Self-pay | Admitting: Nurse Practitioner

## 2022-06-11 ENCOUNTER — Ambulatory Visit: Payer: Medicare HMO | Attending: Nurse Practitioner | Admitting: Nurse Practitioner

## 2022-06-11 ENCOUNTER — Encounter: Payer: Self-pay | Admitting: Nurse Practitioner

## 2022-06-11 VITALS — BP 126/58 | HR 68 | Ht 66.0 in | Wt 154.0 lb

## 2022-06-11 DIAGNOSIS — Z79899 Other long term (current) drug therapy: Secondary | ICD-10-CM

## 2022-06-11 DIAGNOSIS — I2699 Other pulmonary embolism without acute cor pulmonale: Secondary | ICD-10-CM | POA: Diagnosis not present

## 2022-06-11 DIAGNOSIS — R002 Palpitations: Secondary | ICD-10-CM | POA: Diagnosis not present

## 2022-06-11 DIAGNOSIS — N183 Chronic kidney disease, stage 3 unspecified: Secondary | ICD-10-CM

## 2022-06-11 DIAGNOSIS — I10 Essential (primary) hypertension: Secondary | ICD-10-CM

## 2022-06-11 DIAGNOSIS — I723 Aneurysm of iliac artery: Secondary | ICD-10-CM

## 2022-06-11 NOTE — Telephone Encounter (Signed)
PERCERT:  14 day zio monitor - palps

## 2022-06-11 NOTE — Progress Notes (Unsigned)
Cardiology Office Note:    Date:  06/11/2022  ID:  Traci Clark, DOB 03/06/1931, MRN [id number]  PCP:  Traci Lemons, PA   [place] [facility] Cardiologist:  Traci Furbish, MD     Referring MD: Traci Lemons, PA   CC: Here for hospital follow-up  History of Present Illness:    Traci Clark is a 89 y.o. female with a hx of the following:  Hypertension Type 2 diabetes Bilateral iliac artery aneurysm History of colon cancer Stage III chronic kidney disease  Patient is a 89 year old female with past medical history as mentioned above.  She was seen by Dr. Marlou Clark in February 2023 and denied any chest pain at the time.  It was previously felt that her chest pain in the past was felt to be GI related and secondary possibly to gastroparesis.  She had noted intermittent palpitations that had spontaneously resolved, recommended if symptoms recur to place a ZIO monitor.  Last seen by Traci Pho, PA-C on December 19, 2021.  Patient reported intermittent abdominal discomfort.  She noted occasional palpitations, typically occurring less than once a months, resolved within a few minutes.  Patient did note experiencing intermittent leg edema, was taking Lasix 20 mg daily per nephrology.  Given her age and overall atypical symptoms and overall unremarkable workup, Traci Clark stated would not anticipate further ischemic testing at that time.  No medication changes were made.  Was told to follow-up in 1 year.  In the interim, patient had endoscopy that was significant for candidal esophagitis, was taking oral Diflucan.  Patient went to PCP for right sided chest/rib cage pain for about 1 week, was worse with movements and taking deep breath, had some associated nausea without vomiting.  Workup in ED revealed PE started on IV heparin.  Echocardiogram revealed normal EF, no RWMA.  Lower extremity Dopplers were negative for DVT.  PT and OT evaluated patient recommended home health  services.  Patient transitioned successfully from IV heparin to p.o. Eliquis.  She was discharged home in stable condition on May 31, 2022.  Today she presents for hospital follow-up.  She states she is doing well. Had some palpitations yesterday, but it has resolved. Denies any chest pain, shortness of breath, syncope, presyncope, dizziness, orthopnea, PND, swelling or significant weight changes, acute bleeding, or claudication. Notes some intermittent shortness of breath for a while. Denies any other questions or concerns.    Past Medical History:  Diagnosis Date   Anxiety    Arthritis    Chronic constipation    CKD (chronic kidney disease), stage III (HCC)    Colon cancer (HCC)    colon   Complication of anesthesia    Diabetes mellitus without complication ([place])    DVT (deep vein thrombosis) in pregnancy    Dysphagia    GERD (gastroesophageal reflux disease)    Glaucoma    Gout    H/O hiatal hernia    History of kidney stones    Iliac artery aneurysm, bilateral (HCC)    MI (myocardial infarction) ([place]) 1993   PONV (postoperative nausea and vomiting)    Restless leg syndrome    Seizure ([place])    withdrawal from xanax   Vertigo     Past Surgical History:  Procedure Laterality Date   BIOPSY  07/21/2020   Procedure: BIOPSY;  Surgeon: Harvel Quale, MD;  [facility];  Service: Gastroenterology;;   BIOPSY  03/26/2022   Procedure: BIOPSY;  Surgeon: Jenetta Downer  Roney Marion, MD;  [facility];  Service: Gastroenterology;;   CHOLECYSTECTOMY     COLECTOMY  2003   COLONOSCOPY N/A 07/29/2012   Procedure: COLONOSCOPY;  Surgeon: Rogene Houston, MD;  [facility];  Service: Endoscopy;  Laterality: N/A;  100   COLONOSCOPY WITH PROPOFOL N/A 12/13/2016   Procedure: COLONOSCOPY WITH PROPOFOL;  Surgeon: Rogene Houston, MD;  [facility];  Service: Endoscopy;  Laterality: N/A;  8:30   COLONOSCOPY WITH PROPOFOL N/A 07/21/2020    Procedure: COLONOSCOPY WITH PROPOFOL;  Surgeon: Harvel Quale, MD;  [facility];  Service: Gastroenterology;  Laterality: N/A;  AM   ESOPHAGEAL DILATION  11/03/2015   Procedure: ESOPHAGEAL DILATION;  Surgeon: Rogene Houston, MD;  [facility];  Service: Endoscopy;;   ESOPHAGEAL DILATION N/A 09/26/2017   Procedure: ESOPHAGEAL DILATION;  Surgeon: Rogene Houston, MD;  [facility];  Service: Endoscopy;  Laterality: N/A;   ESOPHAGOGASTRODUODENOSCOPY N/A 03/25/2014   Procedure: ESOPHAGOGASTRODUODENOSCOPY (EGD);  Surgeon: Rogene Houston, MD;  [facility];  Service: Endoscopy;  Laterality: N/A;  1055   ESOPHAGOGASTRODUODENOSCOPY N/A 11/03/2015   Procedure: ESOPHAGOGASTRODUODENOSCOPY (EGD);  Surgeon: Rogene Houston, MD;  [facility];  Service: Endoscopy;  Laterality: N/A;  1:55 - moved to 11:15 - Ann to notify - moved to 12:45 - pt knows to arrive at 11:45   ESOPHAGOGASTRODUODENOSCOPY N/A 09/26/2017   Procedure: ESOPHAGOGASTRODUODENOSCOPY (EGD);  Surgeon: Rogene Houston, MD;  [facility];  Service: Endoscopy;  Laterality: N/A;   ESOPHAGOGASTRODUODENOSCOPY (EGD) WITH PROPOFOL N/A 07/21/2020   Procedure: ESOPHAGOGASTRODUODENOSCOPY (EGD) WITH PROPOFOL;  Surgeon: Harvel Quale, MD;  [facility];  Service: Gastroenterology;  Laterality: N/A;   ESOPHAGOGASTRODUODENOSCOPY (EGD) WITH PROPOFOL N/A 03/26/2022   Procedure: ESOPHAGOGASTRODUODENOSCOPY (EGD) WITH PROPOFOL;  Surgeon: Harvel Quale, MD;  [facility];  Service: Gastroenterology;  Laterality: N/A;  1215 ASA 3   MALONEY DILATION N/A 03/25/2014   Procedure: MALONEY DILATION;  Surgeon: Rogene Houston, MD;  [facility];  Service: Endoscopy;  Laterality: N/A;   POLYPECTOMY  12/13/2016   Procedure: POLYPECTOMY;  Surgeon: Rogene Houston, MD;  [facility];  Service: Endoscopy;;  polyp   POLYPECTOMY  07/21/2020    Procedure: POLYPECTOMY;  Surgeon: Montez Morita, Quillian Quince, MD;  [facility];  Service: Gastroenterology;;   TOTAL HIP ARTHROPLASTY Left    TUBAL LIGATION     VEIN SURGERY Bilateral    stripped vein due to DVT    Current Medications: Current Meds  Medication Sig   acetaminophen (TYLENOL) 500 MG tablet Take 1,000 mg by mouth every 6 (six) hours as needed (pain.). GELCAPS   albuterol (VENTOLIN HFA) 108 (90 Base) MCG/ACT inhaler Inhale 1-2 puffs into the lungs every 6 (six) hours as needed for shortness of breath or wheezing. prn   allopurinol (ZYLOPRIM) 100 MG tablet Take 100 mg by mouth in the morning.   ALPRAZolam (XANAX) 0.25 MG tablet Take 1 tablet (0.25 mg total) by mouth 2 (two) times daily as needed for anxiety.   apixaban (ELIQUIS) 5 MG TABS tablet Advised to take Eliquis 10 mg twice daily for 7 days followed by 5 mg twice daily for 6 to 9 months for pulmonary embolism.   aspirin EC 81 MG tablet Take 1 tablet (81 mg total) by mouth daily. Swallow whole.   dexlansoprazole (DEXILANT) 60 MG capsule Take 60 mg  by mouth daily before breakfast.   fluticasone (FLONASE) 50 MCG/ACT nasal spray Place 2 sprays into both nostrils daily as needed (allergies (cough/sneezing)).   furosemide (LASIX) 20 MG tablet Take 20 mg by mouth in the morning.   insulin aspart (NOVOLOG FLEXPEN RELION) 100 UNIT/ML FlexPen Inject 4-10 Units into the skin 3 (three) times daily with meals. Per sliding scale   Insulin Glargine (BASAGLAR KWIKPEN [place]) Inject 15 Units into the skin at bedtime.   latanoprost (XALATAN) 0.005 % ophthalmic solution Place 1 drop into both eyes at bedtime.   meclizine (ANTIVERT) 25 MG tablet Take 1 tablet (25 mg total) by mouth 3 (three) times daily as needed for dizziness.   Multiple Vitamin (MULTIVITAMIN) tablet Take 1 tablet by mouth daily.   multivitamin-lutein (OCUVITE-LUTEIN) CAPS capsule Take 1 capsule by mouth daily.   Plecanatide (TRULANCE) 3 MG TABS Take 3 mg by mouth  daily.   polyethylene glycol (MIRALAX / GLYCOLAX) packet Take 34 g by mouth in the morning.   potassium chloride (MICRO-K) 10 MEQ CR capsule Take 10 mEq by mouth in the morning.   traMADol (ULTRAM) 50 MG tablet Take 1 tablet (50 mg total) by mouth every 6 (six) hours as needed for severe pain.   triamcinolone cream (KENALOG) 0.1 % Apply 1 Application topically 2 (two) times daily.     Allergies:   Amoxil [amoxicillin], Macrobid [nitrofurantoin monohyd macro], Sulfa antibiotics, Pravachol [pravastatin], Codeine, Darvon [propoxyphene], Mirtazapine, Neurontin [gabapentin], Phenergan [promethazine], Rocephin [ceftriaxone], and Hydrocodone-acetaminophen   Social History   Socioeconomic History   Marital status: Married    Spouse name: Not on file   Number of children: Not on file   Years of education: Not on file   Highest education level: Not on file  Occupational History   Not on file  Tobacco Use   Smoking status: Never    Passive exposure: Never   Smokeless tobacco: Never  Vaping Use   Vaping Use: Never used  Substance and Sexual Activity   Alcohol use: No    Alcohol/week: 0.0 standard drinks of alcohol   Drug use: No   Sexual activity: Not Currently    Birth control/protection: Surgical  Other Topics Concern   Not on file  Social History Narrative   Not on file   Social Determinants of Health   Financial Resource Strain: Not on file  Food Insecurity: No Food Insecurity (05/31/2022)   Hunger Vital Sign    Worried About Running Out of Food in the Last Year: Never true    Ran Out of Food in the Last Year: Never true  Transportation Needs: No Transportation Needs (05/31/2022)   PRAPARE - Hydrologist (Medical): No    Lack of Transportation (Non-Medical): No  Physical Activity: Not on file  Stress: Not on file  Social Connections: Not on file     Family History: The patient's family history includes Heart disease in her father.  ROS:    Review of Systems  Constitutional: Negative.   HENT: Negative.    Eyes: Negative.   Respiratory:  Positive for shortness of breath. Negative for cough, hemoptysis, sputum production and wheezing.   Cardiovascular:  Positive for palpitations. Negative for chest pain, orthopnea, claudication, leg swelling and PND.  Gastrointestinal: Negative.   Genitourinary: Negative.   Musculoskeletal: Negative.   Skin: Negative.   Neurological: Negative.   Endo/Heme/Allergies: Negative.   Psychiatric/Behavioral: Negative.      Please see the history of present illness.  All other systems reviewed and are negative.  EKGs/Labs/Other Studies Reviewed:    The following studies were reviewed today:   EKG:  EKG is not ordered today.    Echocardiogram on 05/30/2022: 1. Left ventricular ejection fraction, by estimation, is 55 to 60%. The  left ventricle has normal function. The left ventricle has no regional  wall motion abnormalities. There is moderate left ventricular hypertrophy.  Left ventricular diastolic  parameters are consistent with Grade I diastolic dysfunction (impaired  relaxation).   2. Right ventricular systolic function poorly visualized but appears to  be normal. The right ventricular size is normal.   3. The mitral valve is grossly normal. No evidence of mitral valve  regurgitation. No evidence of mitral stenosis.   4. The aortic valve is tricuspid. There is mild calcification of the  aortic valve. Aortic valve regurgitation is not visualized. No aortic  stenosis is present.   Comparison(s): No significant change from prior study.   CT Angio Chest PE on 05/29/2022: IMPRESSION: Acute PE with moderate thrombus burden, more so in the right lung.   There are patchy infiltrates in right lower lung field suggesting atelectasis/pneumonia and possibly evolving infarction. Small right pleural effusion is seen.   Scattered coronary artery calcifications are seen.   Degenerative  changes are noted in both shoulders, more severe in the left shoulder.   Possible gastroesophageal reflux. Other findings as described in the body of the report.    Recent Labs: 05/30/2022: ALT 16; BUN 15; Creatinine, Ser 1.29; Magnesium 2.0; Potassium 4.0; Sodium 133 05/31/2022: Hemoglobin 10.8; Platelets 332  Recent Lipid Panel    Component Value Date/Time   CHOL 186 09/14/2020 0455   TRIG 82 09/14/2020 0455   HDL 53 09/14/2020 0455   CHOLHDL 3.5 09/14/2020 0455   VLDL 16 09/14/2020 0455   LDLCALC 117 (H) 09/14/2020 0455   Physical Exam:    VS:  BP (!) 126/58   Pulse 68   Ht '5\' 6"'$  (1.676 m)   Wt 154 lb (69.9 kg)   SpO2 100%   BMI 24.86 kg/m     Wt Readings from Last 3 Encounters:  06/11/22 154 lb (69.9 kg)  05/30/22 147 lb 14.9 oz (67.1 kg)  05/06/22 150 lb 1.6 oz (68.1 kg)     GEN: Well nourished, well developed in no acute distress HEENT: Normal NECK: No JVD; No carotid bruits LYMPHATICS: No lymphadenopathy CARDIAC: S1/S2, RRR, no murmurs, rubs, gallops; 2+ pulses RESPIRATORY:  Clear to auscultation without rales, wheezing or rhonchi MUSCULOSKELETAL:  No edema; No deformity  SKIN: Warm and dry NEUROLOGIC:  Alert and oriented x 3 PSYCHIATRIC:  Normal affect   ASSESSMENT:    1. Palpitations   2. Acute pulmonary embolism, unspecified pulmonary embolism type, unspecified whether acute cor pulmonale present ([place])   3. Hypertension, unspecified type   4. Bilateral iliac artery aneurysm (HCC)   5. Stage 3 chronic kidney disease, unspecified whether stage 3a or 3b CKD (HCC)    PLAN:    In order of problems listed above:  Palpitations Felt palpitations yesterday that have since resolved, has been intermittent for a while. SR on exam today. Hx of infrequent palpitations in past.Will check CBC and BMET per hospitalist's recommendations. Recent Mag and K+ level normal. Will arrange 14 day monitor for evaluation. Heart healthy diet and regular cardiovascular exercise  encouraged.   2. Acute PE  Seen on CT angio on May 29, 2022 with moderate thrombus burden noted, more so in right  lung. There were patchy infiltrates in right lower lung field suggesting atelectasis/pneumonia possibly evolving infarction, small right pleural effusion was seen. 2 view CXR was negative for anything acute. Denies any SHOB. Has completed Eliquis 10 mg BID x 7 days and is taking Eliquis 5 mg ID, tolerating well and denies any bleeding issues. Continue Eliquis. If weight continues to decline, will require reduced dosing at 2.5 mg BID. Recent Echo was unremarkable. Continue to follow with PCP.  3. HTN Blood pressure stable today.  BP well-controlled at home.  Continue current medication regimen. Discussed to monitor BP at home at least 2 hours after medications and sitting for 5-10 minutes. Heart healthy diet and regular cardiovascular exercise encouraged.    4. Bilateral iliac artery aneurysm Imaging in 2022 measured 1.7 cm, and 1.8 cm. Vascular surgery evaluated pt and did not recommend follow-up imaging. Pt denies any symptoms. Continue to follow with PCP.  5. CKD stage 3 Stable kidney function. Will repeat BMET as mentioned above. Avoid nephrotoxic agents. Continue to follow with PCP.  6. Disposition: Patient is requesting to be seen locally. Follow-up with Dr. Dellia Cloud in 6-8 weeks or sooner if anything changes.   Medication Adjustments/Labs and Tests Ordered: Current medicines are reviewed at length with the patient today.  Concerns regarding medicines are outlined above.  No orders of the defined types were placed in this encounter.  No orders of the defined types were placed in this encounter.   Patient Instructions  Medication Instructions:  Continue all current medications.   Labwork: CBC, BMET  Testing/Procedures: Your physician has recommended that you wear a 14 day event monitor. Event monitors are medical devices that record the heart's electrical  activity. Doctors most often [place] these monitors to diagnose arrhythmias. Arrhythmias are problems with the speed or rhythm of the heartbeat. The monitor is a small, portable device. You can wear one while you do your normal daily activities. This is usually used to diagnose what is causing palpitations/syncope (passing out).  Office will contact with results via phone, letter or mychart.    Follow-Up: 6 - 8 weeks - Dr. Dellia Cloud   Any Other Special Instructions Will Be Listed Below (If Applicable).   If you need a refill on your cardiac medications before your next appointment, please call your pharmacy.    Signed, Finis Bud, NP  06/12/2022 9:16 PM    [place]

## 2022-06-11 NOTE — Patient Instructions (Signed)
Medication Instructions:  Continue all current medications.   Labwork: none  Testing/Procedures: Your physician has recommended that you wear a 14 day event monitor. Event monitors are medical devices that record the heart's electrical activity. Doctors most often [place] these monitors to diagnose arrhythmias. Arrhythmias are problems with the speed or rhythm of the heartbeat. The monitor is a small, portable device. You can wear one while you do your normal daily activities. This is usually used to diagnose what is causing palpitations/syncope (passing out).  Office will contact with results via phone, letter or mychart.    Follow-Up: 6 - 8 weeks - Dr. Dellia Cloud   Any Other Special Instructions Will Be Listed Below (If Applicable).   If you need a refill on your cardiac medications before your next appointment, please call your pharmacy.

## 2022-06-13 ENCOUNTER — Encounter: Payer: Self-pay | Admitting: Diagnostic Neuroimaging

## 2022-06-13 ENCOUNTER — Ambulatory Visit: Payer: Medicare HMO | Admitting: Diagnostic Neuroimaging

## 2022-06-13 ENCOUNTER — Encounter: Payer: Self-pay | Admitting: *Deleted

## 2022-06-13 ENCOUNTER — Ambulatory Visit: Payer: Medicare HMO | Attending: Nurse Practitioner

## 2022-06-13 ENCOUNTER — Other Ambulatory Visit: Payer: Self-pay | Admitting: Nurse Practitioner

## 2022-06-13 VITALS — BP 120/71 | HR 78 | Ht 64.0 in | Wt 155.0 lb

## 2022-06-13 DIAGNOSIS — R269 Unspecified abnormalities of gait and mobility: Secondary | ICD-10-CM

## 2022-06-13 DIAGNOSIS — R002 Palpitations: Secondary | ICD-10-CM

## 2022-06-13 NOTE — Patient Instructions (Signed)
  GAIT DIFFICULTY (progressive x 4-5 years) - likely related to underlying arthritis, age, deconditioning, diabetic neuropathy, gout, and lumbar spinal stenosis noted on CT abd/pelvis - continue supportive care and fall precautions

## 2022-06-13 NOTE — Progress Notes (Signed)
Will mail lab order to patient's home.

## 2022-06-13 NOTE — Progress Notes (Signed)
Voice mail is full  

## 2022-06-13 NOTE — Addendum Note (Signed)
Addended by: Laurine Blazer on: 06/13/2022 03:49 PM   Modules accepted: Orders

## 2022-06-13 NOTE — Progress Notes (Signed)
[facility]  PATIENT: Traci Clark DOB: [birth year]  REFERRING CLINICIAN: Lavella Lemons, PA HISTORY FROM: patient  REASON FOR VISIT: new consult    HISTORICAL  CHIEF COMPLAINT:  Chief Complaint  Patient presents with   Fall    RM 7 with grandson Randall Hiss  pt is well, states she has been having imbalance but it has gradually worsen. She also reports occasional fainting spells.     HISTORY OF PRESENT ILLNESS:   89 year old female here for gait and balance difficulty.  Patient has had progressive gait and balance difficulties over the past 4 to 5 years.  Started using a cane intermittently, then progressed to walker and rollator.  Symptoms worsening over time.  She feels weakness and gait difficulty in her lower extremities.  Also has arthritis pain in her neck, low back, hips, knees and ankles.   REVIEW OF SYSTEMS: Full 14 system review of systems performed and negative with exception of: as per HPI.  ALLERGIES: Allergies  Allergen Reactions   Amoxil [Amoxicillin] Anaphylaxis and Rash    "Skin peeled off"   Macrobid [Nitrofurantoin Monohyd Macro] Anaphylaxis, Rash and Other (See Comments)    "Skin peeled off"   Sulfa Antibiotics Other (See Comments)    Thrush and itching   Pravachol [Pravastatin] Other (See Comments)    MYALGIAS  MYALGIAS  MYALGIAS  MYALGIAS   Codeine Nausea And Vomiting and Other (See Comments)    shaking   Darvon [Propoxyphene] Nausea And Vomiting and Other (See Comments)    shaking   Mirtazapine Other (See Comments)    Does not tolerate   Neurontin [Gabapentin] Nausea Only    Feel faint   Phenergan [Promethazine] Other (See Comments)    altered mental changes   Rocephin [Ceftriaxone] Other (See Comments)    Yeast infection   Hydrocodone-Acetaminophen Nausea And Vomiting and Other (See Comments)    Causes chest to feel heavy      HOME MEDICATIONS: Outpatient Medications Prior to Visit  Medication Sig Dispense  Refill   acetaminophen (TYLENOL) 500 MG tablet Take 1,000 mg by mouth every 6 (six) hours as needed (pain.). GELCAPS     albuterol (VENTOLIN HFA) 108 (90 Base) MCG/ACT inhaler Inhale 1-2 puffs into the lungs every 6 (six) hours as needed for shortness of breath or wheezing. prn     allopurinol (ZYLOPRIM) 100 MG tablet Take 100 mg by mouth in the morning.     ALPRAZolam (XANAX) 0.25 MG tablet Take 1 tablet (0.25 mg total) by mouth 2 (two) times daily as needed for anxiety. 10 tablet 0   amLODipine (NORVASC) 5 MG tablet Take 5 mg by mouth daily.     apixaban (ELIQUIS) 5 MG TABS tablet Advised to take Eliquis 10 mg twice daily for 7 days followed by 5 mg twice daily for 6 to 9 months for pulmonary embolism. 60 tablet 6   aspirin EC 81 MG tablet Take 1 tablet (81 mg total) by mouth daily. Swallow whole. 30 tablet 12   dexlansoprazole (DEXILANT) 60 MG capsule Take 60 mg by mouth daily before breakfast.     fluticasone (FLONASE) 50 MCG/ACT nasal spray Place 2 sprays into both nostrils daily as needed (allergies (cough/sneezing)).     furosemide (LASIX) 20 MG tablet Take 20 mg by mouth in the morning.     insulin aspart (NOVOLOG FLEXPEN RELION) 100 UNIT/ML FlexPen Inject 4-10 Units into the skin 3 (three) times daily with meals. Per sliding scale  Insulin Glargine (BASAGLAR KWIKPEN Lithium) Inject 15 Units into the skin at bedtime.     latanoprost (XALATAN) 0.005 % ophthalmic solution Place 1 drop into both eyes at bedtime.     meclizine (ANTIVERT) 25 MG tablet Take 1 tablet (25 mg total) by mouth 3 (three) times daily as needed for dizziness. 30 tablet 0   Multiple Vitamin (MULTIVITAMIN) tablet Take 1 tablet by mouth daily.     multivitamin-lutein (OCUVITE-LUTEIN) CAPS capsule Take 1 capsule by mouth daily.     polyethylene glycol (MIRALAX / GLYCOLAX) packet Take 34 g by mouth in the morning.     potassium chloride (MICRO-K) 10 MEQ CR capsule Take 10 mEq by mouth in the morning.     traMADol (ULTRAM) 50  MG tablet Take 1 tablet (50 mg total) by mouth every 6 (six) hours as needed for severe pain. 10 tablet 0   Travoprost, BAK Free, (TRAVATAN) 0.004 % SOLN ophthalmic solution 1 drop at bedtime.     triamcinolone cream (KENALOG) 0.1 % Apply 1 Application topically 2 (two) times daily.     Plecanatide (TRULANCE) 3 MG TABS Take 3 mg by mouth daily. (Patient not taking: Reported on 06/13/2022) 90 tablet 1   No facility-administered medications prior to visit.    PAST MEDICAL HISTORY: Past Medical History:  Diagnosis Date   Anxiety    Arthritis    Chronic constipation    CKD (chronic kidney disease), stage III ([place])    Colon cancer (HCC)    colon   Complication of anesthesia    Diabetes mellitus without complication ([place])    DVT (deep vein thrombosis) in pregnancy    Dysphagia    GERD (gastroesophageal reflux disease)    Glaucoma    Gout    H/O hiatal hernia    History of kidney stones    Iliac artery aneurysm, bilateral (HCC)    MI (myocardial infarction) ([place]) 1993   PONV (postoperative nausea and vomiting)    Restless leg syndrome    Seizure ([place])    withdrawal from xanax   Vertigo     PAST SURGICAL HISTORY: Past Surgical History:  Procedure Laterality Date   BIOPSY  07/21/2020   Procedure: BIOPSY;  Surgeon: Harvel Quale, MD;  [facility];  Service: Gastroenterology;;   BIOPSY  03/26/2022   Procedure: BIOPSY;  Surgeon: Harvel Quale, MD;  [facility];  Service: Gastroenterology;;   CHOLECYSTECTOMY     COLECTOMY  2003   COLONOSCOPY N/A 07/29/2012   Procedure: COLONOSCOPY;  Surgeon: Rogene Houston, MD;  [facility];  Service: Endoscopy;  Laterality: N/A;  100   COLONOSCOPY WITH PROPOFOL N/A 12/13/2016   Procedure: COLONOSCOPY WITH PROPOFOL;  Surgeon: Rogene Houston, MD;  [facility];  Service: Endoscopy;  Laterality: N/A;  8:30   COLONOSCOPY WITH PROPOFOL N/A 07/21/2020   Procedure: COLONOSCOPY WITH  PROPOFOL;  Surgeon: Harvel Quale, MD;  [facility];  Service: Gastroenterology;  Laterality: N/A;  AM   ESOPHAGEAL DILATION  11/03/2015   Procedure: ESOPHAGEAL DILATION;  Surgeon: Rogene Houston, MD;  [facility];  Service: Endoscopy;;   ESOPHAGEAL DILATION N/A 09/26/2017   Procedure: ESOPHAGEAL DILATION;  Surgeon: Rogene Houston, MD;  [facility];  Service: Endoscopy;  Laterality: N/A;   ESOPHAGOGASTRODUODENOSCOPY N/A 03/25/2014   Procedure: ESOPHAGOGASTRODUODENOSCOPY (EGD);  Surgeon: Rogene Houston, MD;  [facility];  Service: Endoscopy;  Laterality: N/A;  1055   ESOPHAGOGASTRODUODENOSCOPY N/A 11/03/2015   Procedure: ESOPHAGOGASTRODUODENOSCOPY (EGD);  Surgeon: Rogene Houston, MD;  [facility];  Service: Endoscopy;  Laterality: N/A;  1:55 - moved to 11:15 - Ann to notify - moved to 12:45 - pt knows to arrive at 11:45   ESOPHAGOGASTRODUODENOSCOPY N/A 09/26/2017   Procedure: ESOPHAGOGASTRODUODENOSCOPY (EGD);  Surgeon: Rogene Houston, MD;  [facility];  Service: Endoscopy;  Laterality: N/A;   ESOPHAGOGASTRODUODENOSCOPY (EGD) WITH PROPOFOL N/A 07/21/2020   Procedure: ESOPHAGOGASTRODUODENOSCOPY (EGD) WITH PROPOFOL;  Surgeon: Harvel Quale, MD;  [facility];  Service: Gastroenterology;  Laterality: N/A;   ESOPHAGOGASTRODUODENOSCOPY (EGD) WITH PROPOFOL N/A 03/26/2022   Procedure: ESOPHAGOGASTRODUODENOSCOPY (EGD) WITH PROPOFOL;  Surgeon: Harvel Quale, MD;  [facility];  Service: Gastroenterology;  Laterality: N/A;  1215 ASA 3   MALONEY DILATION N/A 03/25/2014   Procedure: MALONEY DILATION;  Surgeon: Rogene Houston, MD;  [facility];  Service: Endoscopy;  Laterality: N/A;   POLYPECTOMY  12/13/2016   Procedure: POLYPECTOMY;  Surgeon: Rogene Houston, MD;  [facility];  Service: Endoscopy;;  polyp   POLYPECTOMY  07/21/2020   Procedure: POLYPECTOMY;   Surgeon: Harvel Quale, MD;  [facility];  Service: Gastroenterology;;   TOTAL HIP ARTHROPLASTY Left    TUBAL LIGATION     VEIN SURGERY Bilateral    stripped vein due to DVT    FAMILY HISTORY: Family History  Problem Relation Age of Onset   Heart disease Father     SOCIAL HISTORY: Social History   Socioeconomic History   Marital status: Married    Spouse name: Not on file   Number of children: Not on file   Years of education: Not on file   Highest education level: Not on file  Occupational History   Not on file  Tobacco Use   Smoking status: Never    Passive exposure: Never   Smokeless tobacco: Never  Vaping Use   Vaping Use: Never used  Substance and Sexual Activity   Alcohol use: No    Alcohol/week: 0.0 standard drinks of alcohol   Drug use: No   Sexual activity: Not Currently    Birth control/protection: Surgical  Other Topics Concern   Not on file  Social History Narrative   Not on file   Social Determinants of Health   Financial Resource Strain: Not on file  Food Insecurity: No Food Insecurity (05/31/2022)   Hunger Vital Sign    Worried About Running Out of Food in the Last Year: Never true    Ran Out of Food in the Last Year: Never true  Transportation Needs: No Transportation Needs (05/31/2022)   PRAPARE - Hydrologist (Medical): No    Lack of Transportation (Non-Medical): No  Physical Activity: Not on file  Stress: Not on file  Social Connections: Not on file  Intimate Partner Violence: Not At Risk (05/31/2022)   Humiliation, Afraid, Rape, and Kick questionnaire    Fear of Current or Ex-Partner: No    Emotionally Abused: No    Physically Abused: No    Sexually Abused: No     PHYSICAL EXAM  GENERAL EXAM/CONSTITUTIONAL: Vitals:  Vitals:   06/13/22 1430  BP: 120/71  Pulse: 78  Weight: 155 lb (70.3 kg)  Height: '5\' 4"'$  (1.626 m)   Body mass index is 26.61 kg/m. Wt Readings from Last 3  Encounters:  06/13/22 155 lb (70.3  kg)  06/11/22 154 lb (69.9 kg)  05/30/22 147 lb 14.9 oz (67.1 kg)   Patient is in no distress; well developed, nourished and groomed; neck is supple  CARDIOVASCULAR: Examination of carotid arteries is normal; no carotid bruits Regular rate and rhythm, no murmurs Examination of peripheral vascular system by observation and palpation is normal   MUSCULOSKELETAL: Gait, strength, tone, movements noted in Neurologic exam below  NEUROLOGIC: MENTAL STATUS:      No data to display         awake, alert, oriented to person, place and time recent and remote memory intact normal attention and concentration language fluent, comprehension intact, naming intact fund of knowledge appropriate  CRANIAL NERVE:  2nd, 3rd, 4th, 6th - PUPILS 3MM NO REACTION, POST-SURGICAL, visual fields full to confrontation, extraocular muscles intact, no nystagmus 5th - facial sensation symmetric 7th - facial strength symmetric 8th - hearing intact 9th - palate elevates symmetrically, uvula midline 11th - shoulder shrug symmetric 12th - tongue protrusion midline  MOTOR:  normal bulk and tone BUE 3-4 PROX, 4-5 DISTAL BLE 3-4 PROX, 4 DISTAL LIMITED BY PAIN  SENSORY:  normal and symmetric to light touch, temperature, vibration; DECR IN RIGHT KNEE  COORDINATION:  finger-nose-finger, fine finger movements SLOW  REFLEXES:  deep tendon reflexes TRACE and symmetric  GAIT/STATION:  USING WALKER     DIAGNOSTIC DATA (LABS, IMAGING, TESTING) - I reviewed patient records, labs, notes, testing and imaging myself where available.  Lab Results  Component Value Date   WBC 8.3 05/31/2022   HGB 10.8 (L) 05/31/2022   HCT 32.6 (L) 05/31/2022   MCV 85.8 05/31/2022   PLT 332 05/31/2022      Component Value Date/Time   NA 133 (L) 05/30/2022 0247   K 4.0 05/30/2022 0247   CL 97 (L) 05/30/2022 0247   CO2 26 05/30/2022 0247   GLUCOSE 187 (H) 05/30/2022 0247   BUN 15  05/30/2022 0247   CREATININE 1.29 (H) 05/30/2022 0247   CALCIUM 9.3 05/30/2022 0247   PROT 6.3 (L) 05/30/2022 0247   ALBUMIN 2.7 (L) 05/30/2022 0247   AST 26 05/30/2022 0247   ALT 16 05/30/2022 0247   ALKPHOS 97 05/30/2022 0247   BILITOT 0.6 05/30/2022 0247   GFRNONAA 40 (L) 05/30/2022 0247   GFRAA 51 (L) 09/30/2017 0517   Lab Results  Component Value Date   CHOL 186 09/14/2020   HDL 53 09/14/2020   LDLCALC 117 (H) 09/14/2020   TRIG 82 09/14/2020   CHOLHDL 3.5 09/14/2020   Lab Results  Component Value Date   HGBA1C 7.4 (H) 04/13/2022   No results found for: "VITAMINB12" No results found for: "TSH"  05/29/22 CT abd / pelvis [I reviewed images myself. L2-3 moderate-severe spinal stenosis. - VRP]  04/13/22 CT head / cervical spine 1. Age indeterminate LEFT thalamic infarct, more likely chronic. No other evidence of acute intracranial abnormality. Atrophy and chronic small-vessel white matter ischemic changes. 2. No evidence of acute injury to the cervical spine. Multilevel degenerative changes within the cervical spine.   ASSESSMENT AND PLAN  89 y.o. year old female here with:   Dx:  1. Gait difficulty     PLAN:  GAIT DIFFICULTY (progressive x 4-5 years) - likely related to underlying arthritis, age, deconditioning, diabetic neuropathy, gout, and lumbar spinal stenosis noted on CT abd/pelvis - continue supportive care and fall precautions  Return for return to PCP.    Penni Bombard, MD 7/51/0258, 5:27 PM Certified in Neurology,  Neurophysiology and Neuroimaging  [facility] [address] [place], [place] [address] [phone]

## 2022-07-23 ENCOUNTER — Ambulatory Visit: Payer: Medicaid Other | Attending: Internal Medicine | Admitting: Internal Medicine

## 2022-07-23 ENCOUNTER — Encounter: Payer: Self-pay | Admitting: Internal Medicine

## 2022-07-23 VITALS — BP 150/64 | HR 59 | Ht 66.0 in | Wt 162.4 lb

## 2022-07-23 DIAGNOSIS — I4430 Unspecified atrioventricular block: Secondary | ICD-10-CM | POA: Diagnosis not present

## 2022-07-23 DIAGNOSIS — R002 Palpitations: Secondary | ICD-10-CM | POA: Diagnosis not present

## 2022-07-23 DIAGNOSIS — I441 Atrioventricular block, second degree: Secondary | ICD-10-CM

## 2022-07-23 DIAGNOSIS — I951 Orthostatic hypotension: Secondary | ICD-10-CM | POA: Diagnosis not present

## 2022-07-23 DIAGNOSIS — I440 Atrioventricular block, first degree: Secondary | ICD-10-CM | POA: Insufficient documentation

## 2022-07-23 DIAGNOSIS — R29818 Other symptoms and signs involving the nervous system: Secondary | ICD-10-CM

## 2022-07-23 NOTE — Progress Notes (Signed)
Cardiology Office Note  Date: 07/23/2022   ID: Traci Clark, DOB 08-Sep-1930, MRN [id number]  PCP:  Lavella Lemons, PA  Cardiologist:  Candee Furbish, MD Electrophysiologist:  None   Reason for Office Visit:  Chief Complaint  Patient presents with   Other    6-8 wk f/u c/o heart flutter and syncope. Meds reviewed verbally with pt.    History of Present Illness: Traci Clark is a 89 y.o. female known to have HTN, DM 2, stage III CKD, bilateral iliac artery aneurysm (vascular surgery recommended no further intervention) presented to cardiology clinic for follow-up of palpitations.  Orthostatic vitals are positive today in the clinic for orthostatic hypotension. Patient complains of dizziness both in sitting and standing positions which has been ongoing for quite a while. She underwent event monitor in 05/2022 which I reviewed personally, showed normal sinus rhythm, first-degree AV block, second-degree Mobitz type II AV block, average HR 64 bpm and rare PAC/PVC burden. Second-degree Mobitz type II AV block was noted at 8 AM.  Patient triggered events correlated with normal sinus rhythm, HR 58-81 BPM. Patient says she wakes up sometimes at 8 AM but usually wakes up at 10 AM every day. She was unable to recall if she woke up at 8 AM when she wore the heart monitor. She says she drinks 1 L of water every day. She also has been complaining of chest pressure substernally that began in November 2023 and has been progressively getting worse, now occurring few times per week associated with SOB.  Last for 10 to 15 minutes. She does not do much around the house and this chest pressure episodes occur with minimal activity. She also says she has been sleeping all day and feeling extremely fatigued. She was recently diagnosed with acute PE and currently on Eliquis.  Past Medical History:  Diagnosis Date   Anxiety    Arthritis    Chronic constipation    CKD (chronic kidney disease), stage III  (HCC)    Colon cancer (HCC)    colon   Complication of anesthesia    Diabetes mellitus without complication ([place])    DVT (deep vein thrombosis) in pregnancy    Dysphagia    GERD (gastroesophageal reflux disease)    Glaucoma    Gout    H/O hiatal hernia    History of kidney stones    Iliac artery aneurysm, bilateral (HCC)    MI (myocardial infarction) ([place]) 1993   PONV (postoperative nausea and vomiting)    Restless leg syndrome    Seizure ([place])    withdrawal from xanax   Vertigo     Past Surgical History:  Procedure Laterality Date   BIOPSY  07/21/2020   Procedure: BIOPSY;  Surgeon: Harvel Quale, MD;  [facility];  Service: Gastroenterology;;   BIOPSY  03/26/2022   Procedure: BIOPSY;  Surgeon: Harvel Quale, MD;  [facility];  Service: Gastroenterology;;   CHOLECYSTECTOMY     COLECTOMY  2003   COLONOSCOPY N/A 07/29/2012   Procedure: COLONOSCOPY;  Surgeon: Rogene Houston, MD;  [facility];  Service: Endoscopy;  Laterality: N/A;  100   COLONOSCOPY WITH PROPOFOL N/A 12/13/2016   Procedure: COLONOSCOPY WITH PROPOFOL;  Surgeon: Rogene Houston, MD;  [facility];  Service: Endoscopy;  Laterality: N/A;  8:30   COLONOSCOPY WITH PROPOFOL N/A 07/21/2020   Procedure: COLONOSCOPY WITH PROPOFOL;  Surgeon: Harvel Quale, MD;  [facility];  Service: Gastroenterology;  Laterality: N/A;  AM   ESOPHAGEAL DILATION  11/03/2015   Procedure: ESOPHAGEAL DILATION;  Surgeon: Rogene Houston, MD;  [facility];  Service: Endoscopy;;   ESOPHAGEAL DILATION N/A 09/26/2017   Procedure: ESOPHAGEAL DILATION;  Surgeon: Rogene Houston, MD;  [facility];  Service: Endoscopy;  Laterality: N/A;   ESOPHAGOGASTRODUODENOSCOPY N/A 03/25/2014   Procedure: ESOPHAGOGASTRODUODENOSCOPY (EGD);  Surgeon: Rogene Houston, MD;  [facility];  Service: Endoscopy;  Laterality: N/A;  1055    ESOPHAGOGASTRODUODENOSCOPY N/A 11/03/2015   Procedure: ESOPHAGOGASTRODUODENOSCOPY (EGD);  Surgeon: Rogene Houston, MD;  [facility];  Service: Endoscopy;  Laterality: N/A;  1:55 - moved to 11:15 - Ann to notify - moved to 12:45 - pt knows to arrive at 11:45   ESOPHAGOGASTRODUODENOSCOPY N/A 09/26/2017   Procedure: ESOPHAGOGASTRODUODENOSCOPY (EGD);  Surgeon: Rogene Houston, MD;  [facility];  Service: Endoscopy;  Laterality: N/A;   ESOPHAGOGASTRODUODENOSCOPY (EGD) WITH PROPOFOL N/A 07/21/2020   Procedure: ESOPHAGOGASTRODUODENOSCOPY (EGD) WITH PROPOFOL;  Surgeon: Harvel Quale, MD;  [facility];  Service: Gastroenterology;  Laterality: N/A;   ESOPHAGOGASTRODUODENOSCOPY (EGD) WITH PROPOFOL N/A 03/26/2022   Procedure: ESOPHAGOGASTRODUODENOSCOPY (EGD) WITH PROPOFOL;  Surgeon: Harvel Quale, MD;  [facility];  Service: Gastroenterology;  Laterality: N/A;  1215 ASA 3   MALONEY DILATION N/A 03/25/2014   Procedure: MALONEY DILATION;  Surgeon: Rogene Houston, MD;  [facility];  Service: Endoscopy;  Laterality: N/A;   POLYPECTOMY  12/13/2016   Procedure: POLYPECTOMY;  Surgeon: Rogene Houston, MD;  [facility];  Service: Endoscopy;;  polyp   POLYPECTOMY  07/21/2020   Procedure: POLYPECTOMY;  Surgeon: Montez Morita, Quillian Quince, MD;  [facility];  Service: Gastroenterology;;   TOTAL HIP ARTHROPLASTY Left    TUBAL LIGATION     VEIN SURGERY Bilateral    stripped vein due to DVT    Current Outpatient Medications  Medication Sig Dispense Refill   acetaminophen (TYLENOL) 500 MG tablet Take 1,000 mg by mouth every 6 (six) hours as needed (pain.). GELCAPS     albuterol (VENTOLIN HFA) 108 (90 Base) MCG/ACT inhaler Inhale 1-2 puffs into the lungs every 6 (six) hours as needed for shortness of breath or wheezing. prn     allopurinol (ZYLOPRIM) 100 MG tablet Take 100 mg by mouth in the morning.     ALPRAZolam  (XANAX) 0.25 MG tablet Take 1 tablet (0.25 mg total) by mouth 2 (two) times daily as needed for anxiety. 10 tablet 0   apixaban (ELIQUIS) 5 MG TABS tablet Advised to take Eliquis 10 mg twice daily for 7 days followed by 5 mg twice daily for 6 to 9 months for pulmonary embolism. (Patient taking differently: Advised to take Eliquis 5 mg twice daily for 6 to 9 months for pulmonary embolism.) 60 tablet 6   aspirin EC 81 MG tablet Take 1 tablet (81 mg total) by mouth daily. Swallow whole. 30 tablet 12   dexlansoprazole (DEXILANT) 60 MG capsule Take 60 mg by mouth daily before breakfast.     fluticasone (FLONASE) 50 MCG/ACT nasal spray Place 2 sprays into both nostrils daily as needed (allergies (cough/sneezing)).     furosemide (LASIX) 20 MG tablet Take 20 mg by mouth in the morning.     insulin aspart (NOVOLOG FLEXPEN RELION) 100 UNIT/ML FlexPen Inject 4-10 Units into the skin 3 (three) times daily with meals.  Per sliding scale     Insulin Glargine (BASAGLAR KWIKPEN [place]) Inject 15 Units into the skin at bedtime.     latanoprost (XALATAN) 0.005 % ophthalmic solution Place 1 drop into both eyes at bedtime.     meclizine (ANTIVERT) 25 MG tablet Take 1 tablet (25 mg total) by mouth 3 (three) times daily as needed for dizziness. 30 tablet 0   Multiple Vitamin (MULTIVITAMIN) tablet Take 1 tablet by mouth daily.     multivitamin-lutein (OCUVITE-LUTEIN) CAPS capsule Take 1 capsule by mouth daily.     polyethylene glycol (MIRALAX / GLYCOLAX) packet Take 34 g by mouth in the morning.     potassium chloride (MICRO-K) 10 MEQ CR capsule Take 10 mEq by mouth in the morning.     traMADol (ULTRAM) 50 MG tablet Take 1 tablet (50 mg total) by mouth every 6 (six) hours as needed for severe pain. 10 tablet 0   Travoprost, BAK Free, (TRAVATAN) 0.004 % SOLN ophthalmic solution 1 drop at bedtime.     triamcinolone cream (KENALOG) 0.1 % Apply 1 Application topically 2 (two) times daily.     amLODipine (NORVASC) 5 MG tablet Take  5 mg by mouth daily. (Patient not taking: Reported on 07/23/2022)     No current facility-administered medications for this visit.   Allergies:  Amoxil [amoxicillin], Macrobid [nitrofurantoin monohyd macro], Sulfa antibiotics, Pravachol [pravastatin], Codeine, Darvon [propoxyphene], Mirtazapine, Neurontin [gabapentin], Phenergan [promethazine], Rocephin [ceftriaxone], and Hydrocodone-acetaminophen   Social History: The patient  reports that she has never smoked. She has never been exposed to tobacco smoke. She has never used smokeless tobacco. She reports that she does not drink alcohol and does not use drugs.   Family History: The patient's family history includes Heart disease in her father.   ROS:  Please see the history of present illness. Otherwise, complete review of systems is positive for none.  All other systems are reviewed and negative.   Physical Exam: VS:  Ht '5\' 6"'$  (1.676 m)   Wt 162 lb 6 oz (73.7 kg)   SpO2 95%   BMI 26.21 kg/m , BMI Body mass index is 26.21 kg/m.  Wt Readings from Last 3 Encounters:  07/23/22 162 lb 6 oz (73.7 kg)  06/13/22 155 lb (70.3 kg)  06/11/22 154 lb (69.9 kg)    General: Patient appears comfortable at rest. HEENT: Conjunctiva and lids normal, oropharynx clear with moist mucosa. Neck: Supple, no elevated JVP or carotid bruits, no thyromegaly. Lungs: Clear to auscultation, nonlabored breathing at rest. Cardiac: Regular rate and rhythm, no S3 or significant systolic murmur, no pericardial rub. Abdomen: Soft, nontender, no hepatomegaly, bowel sounds present, no guarding or rebound. Extremities: No pitting edema, distal pulses 2+. Skin: Warm and dry. Musculoskeletal: No kyphosis. Neuropsychiatric: Alert and oriented x3, affect grossly appropriate.  ECG: Normal sinus rhythm, first degree AV block with PR interval 322 ms  Recent Labwork: 05/30/2022: ALT 16; AST 26; BUN 15; Creatinine, Ser 1.29; Magnesium 2.0; Potassium 4.0; Sodium 133 05/31/2022:  Hemoglobin 10.8; Platelets 332     Component Value Date/Time   CHOL 186 09/14/2020 0455   TRIG 82 09/14/2020 0455   HDL 53 09/14/2020 0455   CHOLHDL 3.5 09/14/2020 0455   VLDL 16 09/14/2020 0455   LDLCALC 117 (H) 09/14/2020 0455    Other Studies Reviewed Today:   Assessment and Plan: Patient is 89 year old F known to have HTN, DM 2, stage III CKD, bilateral iliac artery aneurysm (vascular surgery recommended no further intervention) presented to  cardiology clinic for follow-up of palpitations.  # Palpitations -Patient underwent event monitor in 05/2022 which showed normal sinus rhythm, first-degree AV block, second-degree Mobitz type II AV block, average HR 64 bpm and rare PAC/PVC burden. Patient triggered events correlated with normal sinus rhythm, HR 58-81 BPM. No further intervention at this point.  # Orthostatic hypotension -Patient has dizziness with sitting and standing positions. Takes 1 L of water every day.  Educated and instructed patient to take 2 L of water every day.  # First-degree AV block, PR interval 322 ms # Second-degree Mobitz type II AV block -Patient underwent event monitor in 05/2022 which showed normal sinus rhythm, first-degree AV block, second-degree Mobitz type II AV block, average HR 64 bpm and rare PAC/PVC burden. Patient triggered events correlated with normal sinus rhythm, HR 58-81 BPM. Mobitz type II AV block was noted at 8 AM.  Patient says she sometimes wakes up at 8 AM but otherwise sleeps mostly till 10 AM.  EKG today in the clinic showed normal sinus rhythm, first-degree AV block with prolonged PR interval of 322 ms.  Due to chronic dizziness in sitting and standing positions as well as EKG evidence of first-degree AV block with prolonged PR interval of 322 ms and event monitor evidence of second-degree Mobitz type II AV block (although unclear during sleep or not), she will benefit from referral to EP for EP study +/- PPM placement. Will send pulmonology  referral for in lab sleep study and rule out OSA.  # Acute PE -Continue Eliquis 5 mg twice daily -No need to be on aspirin 81  # Chest pressure associated with SOB -Patient started to experience substernal chest pressure still with SOB lasting for 10 to 15 minutes since 03/2022 when she was diagnosed with acute PE. She did not have any prior episodes of chest pressure. This could be likely in setting of acute PE but will reevaluate her symptoms in the future.  # Bilateral iliac artery aneurysm -After surgery saw her and recommended no further intervention  # HTN, partially controlled -Continue Lasix 20 mg once daily  I have spent a total of 33 minutes with patient reviewing chart, EKGs, labs and examining patient as well as establishing an assessment and plan that was discussed with the patient.  > 50% of time was spent in direct patient care.      Medication Adjustments/Labs and Tests Ordered: Current medicines are reviewed at length with the patient today.  Concerns regarding medicines are outlined above.   Tests Ordered: No orders of the defined types were placed in this encounter.   Medication Changes: No orders of the defined types were placed in this encounter.   Disposition:  Follow up  6 months  Signed Fed Ceci Fidel Levy, MD, 07/23/2022 1:06 PM    [facility] [place], [place], [place] [address]

## 2022-07-23 NOTE — Patient Instructions (Addendum)
Medication Instructions:  Your physician recommends that you continue on your current medications as directed. Please refer to the Current Medication list given to you today.  Labwork: none  Testing/Procedures: none  Follow-Up: Your physician recommends that you schedule a follow-up appointment in: 6 months  Any Other Special Instructions Will Be Listed Below (If Applicable). You have been referred to EP You have been referred to Pulmonology  If you need a refill on your cardiac medications before your next appointment, please call your pharmacy.

## 2022-07-25 ENCOUNTER — Encounter: Payer: Self-pay | Admitting: Cardiovascular Disease

## 2022-07-25 ENCOUNTER — Other Ambulatory Visit (INDEPENDENT_AMBULATORY_CARE_PROVIDER_SITE_OTHER): Payer: Medicare (Managed Care)

## 2022-07-25 ENCOUNTER — Ambulatory Visit: Payer: Medicare HMO | Attending: Cardiovascular Disease | Admitting: Cardiovascular Disease

## 2022-07-25 VITALS — BP 120/76 | HR 74 | Ht 66.0 in | Wt 159.0 lb

## 2022-07-25 DIAGNOSIS — I441 Atrioventricular block, second degree: Secondary | ICD-10-CM

## 2022-07-25 DIAGNOSIS — R55 Syncope and collapse: Secondary | ICD-10-CM

## 2022-07-25 NOTE — Progress Notes (Unsigned)
28 DAY ZIO AT - 2 CONSECUTIVE 14 DAY ZIO AT [facility]  [id number] FROM OFFICE INVENTORY APPLIED IN OFFICE.  PATIENT GIVEN 2ND 14 DAY ZIO AT MONITOR SERIAL # [id number] TO APPLY 08/08/2022.

## 2022-07-25 NOTE — Progress Notes (Signed)
Electrophysiology Office Note:    Date:  07/25/2022   ID:  Traci Clark, DOB 03-15-1931, MRN [id number]  PCP:  Traci Clark, [place] [facility] Cardiologist:  Chalmers Guest, MD     Referring MD: Chalmers Guest, MD   History of Present Illness:    Traci Clark is a 89 y.o. female with a hx listed below, significant for HTN, DM 2, stage III CKD, bilateral iliac artery aneurysm (vascular surgery recommended no further intervention), referred for arrhythmia management.  A monitor was placed in January of this year to evaluate palpitations.  It showed an episode of Mobitz 2 AV block at 8 AM.  The patient is typically asleep at that time.  She did indicate some symptom triggered episodes, but these were associated with sinus rhythm.  Today, the patient states that she passed out last week.  She was seated at a table and having meal at the time.  She had a prodrome of lightheadedness and then slumped at the table.  She woke shortly thereafter.  This morning, she had an episode of lightheadedness but did not pass out.  Past Medical History:  Diagnosis Date   Anxiety    Arthritis    Chronic constipation    CKD (chronic kidney disease), stage III (HCC)    Colon cancer (HCC)    colon   Complication of anesthesia    Diabetes mellitus without complication ([place])    DVT (deep vein thrombosis) in pregnancy    Dysphagia    GERD (gastroesophageal reflux disease)    Glaucoma    Gout    H/O hiatal hernia    History of kidney stones    Iliac artery aneurysm, bilateral (HCC)    MI (myocardial infarction) ([place]) 1993   PONV (postoperative nausea and vomiting)    Restless leg syndrome    Seizure ([place])    withdrawal from xanax   Vertigo     Past Surgical History:  Procedure Laterality Date   BIOPSY  07/21/2020   Procedure: BIOPSY;  Surgeon: Harvel Quale, MD;  [facility];  Service: Gastroenterology;;   BIOPSY  03/26/2022    Procedure: BIOPSY;  Surgeon: Harvel Quale, MD;  [facility];  Service: Gastroenterology;;   CHOLECYSTECTOMY     COLECTOMY  2003   COLONOSCOPY N/A 07/29/2012   Procedure: COLONOSCOPY;  Surgeon: Rogene Houston, MD;  [facility];  Service: Endoscopy;  Laterality: N/A;  100   COLONOSCOPY WITH PROPOFOL N/A 12/13/2016   Procedure: COLONOSCOPY WITH PROPOFOL;  Surgeon: Rogene Houston, MD;  [facility];  Service: Endoscopy;  Laterality: N/A;  8:30   COLONOSCOPY WITH PROPOFOL N/A 07/21/2020   Procedure: COLONOSCOPY WITH PROPOFOL;  Surgeon: Harvel Quale, MD;  [facility];  Service: Gastroenterology;  Laterality: N/A;  AM   ESOPHAGEAL DILATION  11/03/2015   Procedure: ESOPHAGEAL DILATION;  Surgeon: Rogene Houston, MD;  [facility];  Service: Endoscopy;;   ESOPHAGEAL DILATION N/A 09/26/2017   Procedure: ESOPHAGEAL DILATION;  Surgeon: Rogene Houston, MD;  [facility];  Service: Endoscopy;  Laterality: N/A;   ESOPHAGOGASTRODUODENOSCOPY N/A 03/25/2014   Procedure: ESOPHAGOGASTRODUODENOSCOPY (EGD);  Surgeon: Rogene Houston, MD;  [facility];  Service: Endoscopy;  Laterality: N/A;  1055   ESOPHAGOGASTRODUODENOSCOPY N/A 11/03/2015   Procedure: ESOPHAGOGASTRODUODENOSCOPY (EGD);  Surgeon: Rogene Houston, MD;  [facility];  Service:  Endoscopy;  Laterality: N/A;  1:55 - moved to 11:15 - Ann to notify - moved to 12:45 - pt knows to arrive at 11:45   ESOPHAGOGASTRODUODENOSCOPY N/A 09/26/2017   Procedure: ESOPHAGOGASTRODUODENOSCOPY (EGD);  Surgeon: Rogene Houston, MD;  [facility];  Service: Endoscopy;  Laterality: N/A;   ESOPHAGOGASTRODUODENOSCOPY (EGD) WITH PROPOFOL N/A 07/21/2020   Procedure: ESOPHAGOGASTRODUODENOSCOPY (EGD) WITH PROPOFOL;  Surgeon: Harvel Quale, MD;  [facility];  Service: Gastroenterology;  Laterality: N/A;   ESOPHAGOGASTRODUODENOSCOPY (EGD)  WITH PROPOFOL N/A 03/26/2022   Procedure: ESOPHAGOGASTRODUODENOSCOPY (EGD) WITH PROPOFOL;  Surgeon: Harvel Quale, MD;  [facility];  Service: Gastroenterology;  Laterality: N/A;  1215 ASA 3   MALONEY DILATION N/A 03/25/2014   Procedure: MALONEY DILATION;  Surgeon: Rogene Houston, MD;  [facility];  Service: Endoscopy;  Laterality: N/A;   POLYPECTOMY  12/13/2016   Procedure: POLYPECTOMY;  Surgeon: Rogene Houston, MD;  [facility];  Service: Endoscopy;;  polyp   POLYPECTOMY  07/21/2020   Procedure: POLYPECTOMY;  Surgeon: Montez Morita, Quillian Quince, MD;  [facility];  Service: Gastroenterology;;   TOTAL HIP ARTHROPLASTY Left    TUBAL LIGATION     VEIN SURGERY Bilateral    stripped vein due to DVT    Current Medications: Current Meds  Medication Sig   acetaminophen (TYLENOL) 500 MG tablet Take 1,000 mg by mouth every 6 (six) hours as needed (pain.). GELCAPS   albuterol (VENTOLIN HFA) 108 (90 Base) MCG/ACT inhaler Inhale 1-2 puffs into the lungs every 6 (six) hours as needed for shortness of breath or wheezing. prn   allopurinol (ZYLOPRIM) 100 MG tablet Take 100 mg by mouth in the morning.   ALPRAZolam (XANAX) 0.25 MG tablet Take 1 tablet (0.25 mg total) by mouth 2 (two) times daily as needed for anxiety.   apixaban (ELIQUIS) 5 MG TABS tablet Advised to take Eliquis 10 mg twice daily for 7 days followed by 5 mg twice daily for 6 to 9 months for pulmonary embolism. (Patient taking differently: Advised to take Eliquis 5 mg twice daily for 6 to 9 months for pulmonary embolism.)   dexlansoprazole (DEXILANT) 60 MG capsule Take 60 mg by mouth daily before breakfast.   fluticasone (FLONASE) 50 MCG/ACT nasal spray Place 2 sprays into both nostrils daily as needed (allergies (cough/sneezing)).   furosemide (LASIX) 20 MG tablet Take 20 mg by mouth in the morning.   insulin aspart (NOVOLOG FLEXPEN RELION) 100 UNIT/ML FlexPen Inject 4-10 Units into  the skin 3 (three) times daily with meals. Per sliding scale   Insulin Glargine (BASAGLAR KWIKPEN Parcoal) Inject 15 Units into the skin at bedtime.   latanoprost (XALATAN) 0.005 % ophthalmic solution Place 1 drop into both eyes at bedtime.   meclizine (ANTIVERT) 25 MG tablet Take 1 tablet (25 mg total) by mouth 3 (three) times daily as needed for dizziness.   Multiple Vitamin (MULTIVITAMIN) tablet Take 1 tablet by mouth daily.   multivitamin-lutein (OCUVITE-LUTEIN) CAPS capsule Take 1 capsule by mouth daily.   polyethylene glycol (MIRALAX / GLYCOLAX) packet Take 34 g by mouth in the morning.   potassium chloride (MICRO-K) 10 MEQ CR capsule Take 10 mEq by mouth in the morning.   traMADol (ULTRAM) 50 MG tablet Take 1 tablet (50 mg total) by mouth every 6 (six) hours as needed for severe pain.   Travoprost, BAK Free, (TRAVATAN) 0.004 % SOLN ophthalmic solution 1 drop at bedtime.   triamcinolone  cream (KENALOG) 0.1 % Apply 1 Application topically 2 (two) times daily.     Allergies:   Amoxil [amoxicillin], Macrobid [nitrofurantoin monohyd macro], Sulfa antibiotics, Pravachol [pravastatin], Codeine, Darvon [propoxyphene], Hydrocodone, Mirtazapine, Neurontin [gabapentin], Phenergan [promethazine], Rocephin [ceftriaxone], and Hydrocodone-acetaminophen   Social and Family History: Reviewed in Epic  ROS:   Please see the history of present illness.    All other systems reviewed and are negative.  EKGs/Labs/Other Studies Reviewed Today:    Cardiac Studies & Procedures       ECHOCARDIOGRAM  ECHOCARDIOGRAM COMPLETE 05/30/2022  Narrative ECHOCARDIOGRAM REPORT    Patient Name:   AITANNA KITZINGER Fronek Date of Exam: 05/30/2022 Medical Rec #:  [id number]          Height:       66.0 in Accession #:    [id number]         Weight:       147.9 lb Date of Birth:  1930-07-15          BSA:          1.759 m Patient Age:    87 years           BP:           123/63 mmHg Patient Gender: F                  HR:            67 bpm. Exam Location:  [facility]  Procedure: 2D Echo, Cardiac Doppler and Color Doppler  Indications:    Pulmonary embolus  History:        Patient has prior history of Echocardiogram examinations, most recent 09/14/2020. Previous Myocardial Infarction, Signs/Symptoms:Altered Mental Status and Chest Pain; Risk Factors:Hypertension and Diabetes. Pulm Embolus, Colon CA.  Sonographer:    Wenda Low Referring Phys: [id number] OLADAPO ADEFESO  IMPRESSIONS   1. Left ventricular ejection fraction, by estimation, is 55 to 60%. The left ventricle has normal function. The left ventricle has no regional wall motion abnormalities. There is moderate left ventricular hypertrophy. Left ventricular diastolic parameters are consistent with Grade I diastolic dysfunction (impaired relaxation). 2. Right ventricular systolic function poorly visualized but appears to be normal. The right ventricular size is normal. 3. The mitral valve is grossly normal. No evidence of mitral valve regurgitation. No evidence of mitral stenosis. 4. The aortic valve is tricuspid. There is mild calcification of the aortic valve. Aortic valve regurgitation is not visualized. No aortic stenosis is present.  Comparison(s): No significant change from prior study.  FINDINGS Left Ventricle: Left ventricular ejection fraction, by estimation, is 55 to 60%. The left ventricle has normal function. The left ventricle has no regional wall motion abnormalities. The left ventricular internal cavity size was normal in size. There is moderate left ventricular hypertrophy. Left ventricular diastolic parameters are consistent with Grade I diastolic dysfunction (impaired relaxation).  Right Ventricle: The right ventricular size is normal. No increase in right ventricular wall thickness. Right ventricular systolic function poorly visualized but appears to be normal.  Left Atrium: Left atrial size was normal in size.  Right Atrium: Right  atrial size was normal in size.  Pericardium: There is no evidence of pericardial effusion.  Mitral Valve: The mitral valve is grossly normal. No evidence of mitral valve regurgitation. No evidence of mitral valve stenosis. MV peak gradient, 3.4 mmHg. The mean mitral valve gradient is 1.0 mmHg.  Tricuspid Valve: The tricuspid valve is not well visualized. Tricuspid valve regurgitation is not  demonstrated. No evidence of tricuspid stenosis.  Aortic Valve: The aortic valve is tricuspid. There is mild calcification of the aortic valve. Aortic valve regurgitation is not visualized. No aortic stenosis is present. Aortic valve mean gradient measures 3.0 mmHg. Aortic valve peak gradient measures 5.4 mmHg. Aortic valve area, by VTI measures 2.30 cm.  Pulmonic Valve: The pulmonic valve was not well visualized. Pulmonic valve regurgitation is trivial. No evidence of pulmonic stenosis.  Aorta: The aortic root is normal in size and structure.  Venous: The inferior vena cava was not well visualized.  IAS/Shunts: No atrial level shunt detected by color flow Doppler.   LEFT VENTRICLE PLAX 2D LVIDd:         4.30 cm   Diastology LVIDs:         2.50 cm   LV e' medial:    4.13 cm/s LV PW:         1.20 cm   LV E/e' medial:  11.6 LV IVS:        1.50 cm   LV e' lateral:   7.07 cm/s LVOT diam:     2.00 cm   LV E/e' lateral: 6.8 LV SV:         61 LV SV Index:   34 LVOT Area:     3.14 cm   RIGHT VENTRICLE RV Basal diam:  3.05 cm RV Mid diam:    3.00 cm RV S prime:     9.90 cm/s TAPSE (M-mode): 2.6 cm  LEFT ATRIUM             Index        RIGHT ATRIUM           Index LA diam:        3.40 cm 1.93 cm/m   RA Area:     14.40 cm LA Vol (A2C):   42.1 ml 23.93 ml/m  RA Volume:   38.40 ml  21.83 ml/m LA Vol (A4C):   20.2 ml 11.48 ml/m LA Biplane Vol: 30.2 ml 17.17 ml/m AORTIC VALVE                    PULMONIC VALVE AV Area (Vmax):    2.74 cm     PV Vmax:       1.10 m/s AV Area (Vmean):   2.33 cm      PV Peak grad:  4.8 mmHg AV Area (VTI):     2.30 cm AV Vmax:           116.00 cm/s AV Vmean:          82.600 cm/s AV VTI:            0.264 m AV Peak Grad:      5.4 mmHg AV Mean Grad:      3.0 mmHg LVOT Vmax:         101.00 cm/s LVOT Vmean:        61.200 cm/s LVOT VTI:          0.193 m LVOT/AV VTI ratio: 0.73  AORTA Ao Root diam: 3.20 cm  MITRAL VALVE               TRICUSPID VALVE MV Area (PHT): 4.93 cm    TR Peak grad:   19.7 mmHg MV Area VTI:   3.28 cm    TR Vmax:        222.00 cm/s MV Peak grad:  3.4 mmHg MV Mean grad:  1.0 mmHg  SHUNTS MV Vmax:       0.92 m/s    Systemic VTI:  0.19 m MV Vmean:      41.3 cm/s   Systemic Diam: 2.00 cm MV Decel Time: 154 msec MV E velocity: 48.00 cm/s MV A velocity: 95.10 cm/s MV E/A ratio:  0.50  Vishnu Priya Mallipeddi Electronically signed by Lorelee Cover Mallipeddi Signature Date/Time: 05/30/2022/3:52:02 PM    Final             EKG:  Last EKG results: sinus rhythm with prolonged first degree AV block   Recent Labs: 05/30/2022: ALT 16; BUN 15; Creatinine, Ser 1.29; Magnesium 2.0; Potassium 4.0; Sodium 133 05/31/2022: Hemoglobin 10.8; Platelets 332     Physical Exam:    VS:  BP 120/76   Pulse 74   Ht '5\' 6"'$  (1.676 m)   Wt 159 lb (72.1 kg)   SpO2 98%   BMI 25.66 kg/m     Wt Readings from Last 3 Encounters:  07/25/22 159 lb (72.1 kg)  07/23/22 162 lb 6 oz (73.7 kg)  06/13/22 155 lb (70.3 kg)     GEN:  Well nourished, well developed in no acute distress CARDIAC: RRR, no murmurs, rubs, gallops RESPIRATORY:  Normal work of breathing MUSCULOSKELETAL: no edema    ASSESSMENT & PLAN:    Recurrent syncope and pre-syncope: no definitive association with bradycardia as she was not wearing a monitor during episodes though I think the evidence supports placement of a pacemaker at this time. I discussed the indication and rationale with the patient and her grandson. She is reluctant to have the procedure. Given this, we  will place an MCOT to collect additional data.   AV block: prolonged first degree block and Mobtiz II block: occurred while she was in bed, possibly asleep. Palpitations: patient-triggered events were associated with sinus rhythm.         Medication Adjustments/Labs and Tests Ordered: Current medicines are reviewed at length with the patient today.  Concerns regarding medicines are outlined above.  No orders of the defined types were placed in this encounter.  No orders of the defined types were placed in this encounter.    Signed, Melida Quitter, MD  07/25/2022 4:48 PM    [place]

## 2022-07-25 NOTE — Patient Instructions (Signed)
Medication Instructions:  Your physician recommends that you continue on your current medications as directed. Please refer to the Current Medication list given to you today.  *If you need a refill on your cardiac medications before your next appointment, please call your pharmacy*   Lab Work: None ordered    Testing/Procedures: ZIO AT Long term monitor-Live Telemetry  Your physician has requested you wear a ZIO patch monitor for 28 days.  This is a single patch monitor. [employer] supplies one patch monitor per enrollment. Additional  stickers are not available.  Please do not apply patch if you will be having a Nuclear Stress Test, Echocardiogram, Cardiac CT, MRI,  or Chest Xray during the period you would be wearing the monitor. The patch cannot be worn during  these tests. You cannot remove and re-apply the ZIO AT patch monitor.  Your ZIO patch monitor will be mailed 3 day USPS to your address on file. It may take 3-5 days to  receive your monitor after you have been enrolled.  Once you have received your monitor, please review the enclosed instructions. Your monitor has  already been registered assigning a specific monitor serial # to you.   Billing and Patient Assistance Program information  Traci Clark has been supplied with any insurance information on record for billing. [employer] offers a sliding scale Patient Assistance Program for patients without insurance, or whose  insurance does not completely cover the cost of the ZIO patch monitor. You must apply for the  Patient Assistance Program to qualify for the discounted rate. To apply, call [employer] at [phone],  select option 4, select option 2 , ask to apply for the Patient Assistance Program, (you can request an  interpreter if needed). [employer] will ask your household income and how many people are in your  household. [employer] will quote your out-of-pocket cost based on this information. They will also be able  to set up a 12  month interest free payment plan if needed.  Applying the monitor   Shave hair from upper left chest.  Hold the abrader disc by orange tab. Rub the abrader in 40 strokes over left upper chest as indicated in  your monitor instructions.  Clean area with 4 enclosed alcohol pads. Use all pads to ensure the area is cleaned thoroughly. Let  dry.  Apply patch as indicated in monitor instructions. Patch will be placed under collarbone on left side of  chest with arrow pointing upward.  Rub patch adhesive wings for 2 minutes. Remove the white label marked "1". Remove the white label  marked "2". Rub patch adhesive wings for 2 additional minutes.  While looking in a mirror, press and release button in center of patch. A small green light will flash 3-4  times. This will be your only indicator that the monitor has been turned on.  Do not shower for the first 24 hours. You may shower after the first 24 hours.  Press the button if you feel a symptom. You will hear a small click. Record Date, Time and Symptom in  the Patient Log.   Starting the Gateway  In your kit there is a Hydrographic surveyor box the size of a cellphone. This is Airline pilot. It transmits all your  recorded data to [facility]. This box must always stay within 10 feet of you. Open the box and push the *  button. There will be a light that blinks orange and then green a few times. When the light stops  blinking, the Gateway is connected to the ZIO patch. Call [employer] at [phone] to confirm your monitor is transmitting.  Returning your monitor  Remove your patch and place it inside the [place]. In the lower half of the Gateway there is a white  bag with prepaid postage on it. Place Gateway in bag and seal. Mail package back to [place] as soon as  possible. Your physician should have your final report approximately 7 days after you have mailed back  your monitor. Call [place] at [phone] if you have  questions regarding your ZIO AT  patch monitor. Call them immediately if you see an orange light blinking on your monitor.  If your monitor falls off in less than 4 days, contact our Monitor department at [phone]. If your  monitor becomes loose or falls off after 4 days call [employer] at [phone] for suggestions on  securing your monitor   Follow-Up: At [facility], you and your health needs are our priority.  As part of our continuing mission to provide you with exceptional heart care, we have created designated Provider Care Teams.  These Care Teams include your primary Cardiologist (physician) and Advanced Practice Providers (APPs -  Physician Assistants and Nurse Practitioners) who all work together to provide you with the care you need, when you need it.   Your next appointment:   To   be determined   The format for your next appointment:   In Person  Provider:   Doralee Albino, MD{   Thank you for choosing [facility]!!   [phone]  Other Instructions

## 2022-07-30 ENCOUNTER — Telehealth: Payer: Self-pay | Admitting: Cardiovascular Disease

## 2022-07-30 DIAGNOSIS — I441 Atrioventricular block, second degree: Secondary | ICD-10-CM

## 2022-07-30 NOTE — Telephone Encounter (Signed)
  Per MyChart Scheduling message:  Patient would like to discuss possibily scheduling the pacemaker procedure

## 2022-08-06 NOTE — Telephone Encounter (Signed)
Pt is scheduled on 4/29 @ 9:30. She will go to [facility] for labs on the week of 4/15.Marland Kitchen  She will get scrub from [place] office the day of her labs.

## 2022-08-07 ENCOUNTER — Telehealth: Payer: Self-pay | Admitting: Cardiovascular Disease

## 2022-08-07 NOTE — Telephone Encounter (Signed)
Pt's grandson would like a callback regarding her PCP wanting to do a CT scan on pt but they wont do it while she has Zio Monitor on. He stated pt still has 14 days left to wear it and would like to know what to do. Please advise.

## 2022-08-07 NOTE — Telephone Encounter (Signed)
Called patient's grandson Charlann Boxer [facility]) to discuss heart monitor duration as PCP is trying to schedule a CT. Randall Hiss states that CT is to check on pulmonary embolism that was found during hospital stay 05/29/22. Patient has pacemaker placement scheduled for 09/16/22 and was also initiated with 28 day heart monitor sometime in March. Randall Hiss states patient has 2 more weeks of time to wear heart monitor and CT department states they cannot do CT scan while heart monitor is in place. Patient is currently on eliquis for pulmonary embolism. Randall Hiss reports no SOB or recent chest pain. He is asking if CT should be delayed until heart monitor is completed. Forwarded to Dr. Myles Gip.

## 2022-08-08 NOTE — Telephone Encounter (Signed)
Returned call to grandson, Randall Hiss.  Shared reply from [place], monitor tech: Patient has 2 - 14 day monitors back to back starting 07/25/22.  Her monitors should be completed 08/22/22 if instructions followed.  CT can be scheduled afterwards, (in two weeks).  Monitor cannot be removed for CT and re-applied. Thanks, Alva Garnet verbalized understanding and states they will schedule CT after patient finishes with heart monitor. Eric expressed appreciation for follow-up.

## 2022-08-11 ENCOUNTER — Telehealth: Payer: Self-pay | Admitting: Internal Medicine

## 2022-08-11 NOTE — Telephone Encounter (Signed)
  Cardiology note: Received a call by [employer] due to a patient triggered event.  This happened around 5:20 PM and it related with a transient episode of Mobitz 2 block that lasted 3.9 seconds.  Per monitor company, her baseline rhythm is sinus with an average heart rate of 63 bpm.  Spoke with the patient.  She stated that during that episode she was lightheaded but this did not fall or pass out.  Symptoms completely resolved and she has been asymptomatic since.  I also spoke with her grandson who confirmed that patient was feeling well and performing her usual activities without any particular issues.

## 2022-08-12 ENCOUNTER — Telehealth: Payer: Self-pay

## 2022-08-12 NOTE — Telephone Encounter (Signed)
   Cardiac Monitor Alert  Date of alert:  08/12/2022   Patient Name: Traci Clark  DOB: 1930/11/12  MRN: [id number]   [facility] Cardiologist: Chalmers Guest, MD  [facility] EP:  Dr Doralee Albino   Monitor Information: Long Term Monitor-Live Telemetry [ZioAT]Long Term Monitor [ZioXT]  Reason:  syncope and collapse, Second degree AV block Ordering provider:  Dr Myles Gip   Alert 2nd degree AV Block, Type II This is the 1st alert for this rhythm.   Next Cardiology Appointment   Date:  09/26/2022  Provider:  Mealor PPM insertion scheduled for 09/16/2022  The patient was contacted today.  She is asymptomatic. Arrhythmia, symptoms and history reviewed with Dr Angelena Form.   Plan:  continue to monitor    Tor Netters, RN  08/12/2022 10:20 AM

## 2022-08-14 ENCOUNTER — Telehealth: Payer: Self-pay | Admitting: Cardiovascular Disease

## 2022-08-14 NOTE — Telephone Encounter (Signed)
Traci Clark with [employer] calling with abnormal monitor results

## 2022-08-14 NOTE — Telephone Encounter (Incomplete)
[facility] called, [facility], [name], at 130 pm on 08/14/2022.  [name] stated the alerts on 3/24/20204 were labeled incorrectly.  Per [name], the Pt was symptomatic, but 2 degree Heart Block Mobitz ( 2 ) was reported;  But incorrect.    The correct interpretation of the 3 reports on 08/11/2022 were 1. 3/24 at 513 pm: First degree AV block, present, Bundle branch block / IVCD was present, Isolated SVE present. 2.  3/24 at 743 pm: First Degree AV Block present, Bundle branch block / IVCD was present, SVE Couplets were present, Isolated VE were present.  3.  3/24 at 747 pm: First Degree AV Block present, Bundle branch block / IVCD present, Second degree AV block mobitz 1 ( Wenckebach ) was present.  Isolated VE were present.    The amended reports were printed, and taken to DOD, Dr. Ali Lowe for review.  Dr. Ali Lowe made aware Pt is having a pacemaker implant procedure with Dr. Norberto Sorenson on 09/16/2022.  No new orders were given;  Dr. Ali Lowe wanted Dr. Myles Gip to be made aware of the amended rhythm strips.    A new [facility] report will be created and message sent to Dr. Norberto Sorenson and his RN.       Cardiac Monitor Alert  Date of alert:  08/14/2022   Patient Name: Traci Clark  DOB: 10-Feb-1931  MRN: [id number]   [place] Cardiologist: Chalmers Guest, MD  [facility] EP:  Dr. Doralee Albino   Monitor Information: Long Term Monitor-Live Telemetry [ZioAT]Long Term Monitor [ZioXT]  Reason:  Atrioventricular block, second Ordering provider:  Dr. Norberto Sorenson    Alert See below for revised [facility] reports from 08/11/2022.  This is the  unknown  alert for this rhythm.   Next Cardiology Appointment }  Date:  09/16/2022  Provider:  Dr. Norberto Sorenson Procedure for Pacemaker Implant.   Arrhythmia, symptoms and history reviewed with DOD, Dr. Ali Lowe.   Plan:  Pacemaker implant.   Other: Amended [facility] reports for 08/11/2022, X3.  Per [name] of [place], the reports  were changed from symptomatic second degree heart block, mobitz 2, the three reports as follows:  The correct interpretation of the 3 reports on 08/11/2022 were  1. 3/24 at 513 pm: First degree AV block, present, Bundle branch block / IVCD was present, Isolated SVE present.  2.  3/24 at 743 pm: First Degree AV Block present, Bundle branch block / IVCD was present, SVE Couplets were present, Isolated VE were present.    3.  3/24 at 747 pm: First Degree AV Block present, Bundle branch block / IVCD present, Second degree AV block mobitz 1 ( Wenckebach ) was present.  Isolated VE were present.    The three revised [facility] strips signed by Dr. Ali Lowe, will be scanned into the patients chart, and message forwarded to Dr. Norberto Sorenson and his RN.    Varney Daily, RN  08/14/2022 2:48 PM

## 2022-08-19 ENCOUNTER — Telehealth: Payer: Self-pay | Admitting: Cardiovascular Disease

## 2022-08-19 NOTE — Telephone Encounter (Signed)
Confirmed [facility] to ship the patient a 3rd device, since she has reached maximum threshold of transmission on her current monitor.  She has 3 more days to complete.

## 2022-08-19 NOTE — Telephone Encounter (Signed)
Caller stated patient's heart monitor is reaching maximum threshold for transmitting patient triggered event transmissions and patient is on Day 25 of her testing.  Caller stated they want to send the patient a new Zio monitor.  Ref# [id number].

## 2022-08-20 ENCOUNTER — Encounter (HOSPITAL_BASED_OUTPATIENT_CLINIC_OR_DEPARTMENT_OTHER): Payer: Self-pay | Admitting: Pulmonary Disease

## 2022-08-20 ENCOUNTER — Ambulatory Visit (INDEPENDENT_AMBULATORY_CARE_PROVIDER_SITE_OTHER): Payer: Medicaid Other | Admitting: Pulmonary Disease

## 2022-08-20 VITALS — BP 142/78 | HR 70 | Temp 98.2°F | Ht 66.0 in | Wt 157.6 lb

## 2022-08-20 DIAGNOSIS — G4733 Obstructive sleep apnea (adult) (pediatric): Secondary | ICD-10-CM

## 2022-08-20 NOTE — Patient Instructions (Signed)
X Home sleep test 

## 2022-08-20 NOTE — Progress Notes (Signed)
Subjective:    Patient ID: Traci Clark, female    DOB: [birth year], 89 y.o.   MRN: [id number]  HPI  89 year old diabetic referred for evaluation of sleep disordered breathing. She presented with dizzy spells and underwent cardiology/EP evaluation.  She was noted to have Mobitz type II AV block especially during sleep.  Event monitor was extended to 4 more weeks and a PPM is planned in late April.  Since the events were noted during sleep, question of OSA has been raised  PMH - HTN, DM 2, stage III CKD, bilateral iliac artery aneurysm  -Bilateral pulmonary emboli, unprovoked 05/2022  She is accompanied by her grandson Randall Hiss with whom she lives, who corroborates history. Epworth sleepiness score is 5 but Randall Hiss reports that sometimes she is sleepy all day.  She reports occasional gasping episodes in her sleep.  She reports restless sleep.  She is moving around a lot and has knocked over to the table lamp on 1 occasion.  Randall Hiss has not noted snoring. Bedtime is around 8 PM, sleep latency 15 minutes, she sleeps on the left side with 2 pillows, reports a couple of awakenings and is out of bed latest by 10 AM feeling rested with dryness of mouth, no headaches.  She is often falling asleep in the recliner during the day There is no history suggestive of cataplexy, sleep paralysis or parasomnias  Reviewed cardiology, EP and nephrology evaluations  Significant tests/ events reviewed  CT A chest 05/2022 >> in the right upper lobe and right lower lobe pulmonary arteries extending into multiple segmental and subsegmental branches. There is small filling defect in left lower lobe pulmonary artery   Ven duplex neg   Past Medical History:  Diagnosis Date   Anxiety    Arthritis    Chronic constipation    CKD (chronic kidney disease), stage III    Colon cancer    colon   Complication of anesthesia    Diabetes mellitus without complication    DVT (deep vein thrombosis) in pregnancy    Dysphagia     GERD (gastroesophageal reflux disease)    Glaucoma    Gout    H/O hiatal hernia    History of kidney stones    Iliac artery aneurysm, bilateral    MI (myocardial infarction) 1993   PONV (postoperative nausea and vomiting)    Restless leg syndrome    Seizure    withdrawal from xanax   Vertigo    Past Surgical History:  Procedure Laterality Date   BIOPSY  07/21/2020   Procedure: BIOPSY;  Surgeon: Harvel Quale, MD;  [facility];  Service: Gastroenterology;;   BIOPSY  03/26/2022   Procedure: BIOPSY;  Surgeon: Harvel Quale, MD;  [facility];  Service: Gastroenterology;;   CHOLECYSTECTOMY     COLECTOMY  2003   COLONOSCOPY N/A 07/29/2012   Procedure: COLONOSCOPY;  Surgeon: Rogene Houston, MD;  [facility];  Service: Endoscopy;  Laterality: N/A;  100   COLONOSCOPY WITH PROPOFOL N/A 12/13/2016   Procedure: COLONOSCOPY WITH PROPOFOL;  Surgeon: Rogene Houston, MD;  [facility];  Service: Endoscopy;  Laterality: N/A;  8:30   COLONOSCOPY WITH PROPOFOL N/A 07/21/2020   Procedure: COLONOSCOPY WITH PROPOFOL;  Surgeon: Harvel Quale, MD;  [facility];  Service: Gastroenterology;  Laterality: N/A;  AM   ESOPHAGEAL DILATION  11/03/2015   Procedure: ESOPHAGEAL DILATION;  Surgeon: Rogene Houston, MD;  [facility];  Service: Endoscopy;;   ESOPHAGEAL DILATION N/A 09/26/2017   Procedure: ESOPHAGEAL DILATION;  Surgeon: Rogene Houston, MD;  [facility];  Service: Endoscopy;  Laterality: N/A;   ESOPHAGOGASTRODUODENOSCOPY N/A 03/25/2014   Procedure: ESOPHAGOGASTRODUODENOSCOPY (EGD);  Surgeon: Rogene Houston, MD;  [facility];  Service: Endoscopy;  Laterality: N/A;  1055   ESOPHAGOGASTRODUODENOSCOPY N/A 11/03/2015   Procedure: ESOPHAGOGASTRODUODENOSCOPY (EGD);  Surgeon: Rogene Houston, MD;  [facility];  Service: Endoscopy;  Laterality: N/A;  1:55 - moved to 11:15 -  Ann to notify - moved to 12:45 - pt knows to arrive at 11:45   ESOPHAGOGASTRODUODENOSCOPY N/A 09/26/2017   Procedure: ESOPHAGOGASTRODUODENOSCOPY (EGD);  Surgeon: Rogene Houston, MD;  [facility];  Service: Endoscopy;  Laterality: N/A;   ESOPHAGOGASTRODUODENOSCOPY (EGD) WITH PROPOFOL N/A 07/21/2020   Procedure: ESOPHAGOGASTRODUODENOSCOPY (EGD) WITH PROPOFOL;  Surgeon: Harvel Quale, MD;  [facility];  Service: Gastroenterology;  Laterality: N/A;   ESOPHAGOGASTRODUODENOSCOPY (EGD) WITH PROPOFOL N/A 03/26/2022   Procedure: ESOPHAGOGASTRODUODENOSCOPY (EGD) WITH PROPOFOL;  Surgeon: Harvel Quale, MD;  [facility];  Service: Gastroenterology;  Laterality: N/A;  1215 ASA 3   MALONEY DILATION N/A 03/25/2014   Procedure: MALONEY DILATION;  Surgeon: Rogene Houston, MD;  [facility];  Service: Endoscopy;  Laterality: N/A;   POLYPECTOMY  12/13/2016   Procedure: POLYPECTOMY;  Surgeon: Rogene Houston, MD;  [facility];  Service: Endoscopy;;  polyp   POLYPECTOMY  07/21/2020   Procedure: POLYPECTOMY;  Surgeon: Montez Morita, Quillian Quince, MD;  [facility];  Service: Gastroenterology;;   TOTAL HIP ARTHROPLASTY Left    TUBAL LIGATION     VEIN SURGERY Bilateral    stripped vein due to DVT    Allergies  Allergen Reactions   Amoxil [Amoxicillin] Anaphylaxis and Rash    "Skin peeled off"   Macrobid [Nitrofurantoin Monohyd Macro] Anaphylaxis, Rash and Other (See Comments)    "Skin peeled off"   Sulfa Antibiotics Other (See Comments)    Thrush and itching   Pravachol [Pravastatin] Other (See Comments)    MYALGIAS  MYALGIAS  MYALGIAS  MYALGIAS   Codeine Nausea And Vomiting and Other (See Comments)    shaking   Darvon [Propoxyphene] Nausea And Vomiting and Other (See Comments)    shaking   Hydrocodone    Mirtazapine Other (See Comments)    Does not tolerate   Neurontin [Gabapentin] Nausea Only    Feel faint    Phenergan [Promethazine] Other (See Comments)    altered mental changes   Rocephin [Ceftriaxone] Other (See Comments)    Yeast infection   Hydrocodone-Acetaminophen Nausea And Vomiting and Other (See Comments)    Causes chest to feel heavy      Social History   Socioeconomic History   Marital status: Married    Spouse name: Not on file   Number of children: Not on file   Years of education: Not on file   Highest education level: Not on file  Occupational History   Not on file  Tobacco Use   Smoking status: Never    Passive exposure: Never   Smokeless tobacco: Never  Vaping Use   Vaping Use: Never used  Substance and Sexual Activity   Alcohol use: No    Alcohol/week: 0.0 standard drinks of alcohol   Drug use: No   Sexual activity: Not Currently    Birth control/protection: Surgical  Other Topics Concern  Not on file  Social History Narrative   Not on file   Social Determinants of Health   Financial Resource Strain: Not on file  Food Insecurity: No Food Insecurity (05/31/2022)   Hunger Vital Sign    Worried About Running Out of Food in the Last Year: Never true    Ran Out of Food in the Last Year: Never true  Transportation Needs: No Transportation Needs (05/31/2022)   PRAPARE - Hydrologist (Medical): No    Lack of Transportation (Non-Medical): No  Physical Activity: Not on file  Stress: Not on file  Social Connections: Not on file  Intimate Partner Violence: Not At Risk (05/31/2022)   Humiliation, Afraid, Rape, and Kick questionnaire    Fear of Current or Ex-Partner: No    Emotionally Abused: No    Physically Abused: No    Sexually Abused: No    Family History  Problem Relation Age of Onset   Heart disease Father      Review of Systems Shortness of breath with activity and at rest Irregular heartbeat Difficulty swallowing Headaches Anxiety Joint stiffness     Objective:   Physical Exam  Gen. Pleasant,  well-nourished, in no distress, normal affect ENT - no pallor,icterus, no post nasal drip, edentulous Neck: No JVD, no thyromegaly, no carotid bruits Lungs: no use of accessory muscles, no dullness to percussion, clear without rales or rhonchi  Cardiovascular: Rhythm regular, heart sounds  normal, no murmurs or gallops, no peripheral edema Abdomen: soft and non-tender, no hepatosplenomegaly, BS normal. Musculoskeletal: No deformities, no cyanosis or clubbing Neuro:  alert, non focal       Assessment & Plan:    OSA -on hypersomnolence seems to be worse than what is being reported.  With a history of bradycardia arrhythmias especially during sleep monthly disarm anything will have to be evaluated.  Will proceed with a home sleep test Pretest probability is intermediate  the pathophysiology of obstructive sleep apnea , it's cardiovascular consequences & modes of treatment including CPAP were discused with the patient in detail & they evidenced understanding.

## 2022-08-22 ENCOUNTER — Other Ambulatory Visit: Payer: Self-pay

## 2022-08-22 ENCOUNTER — Telehealth: Payer: Self-pay

## 2022-08-22 ENCOUNTER — Telehealth: Payer: Self-pay | Admitting: Internal Medicine

## 2022-08-22 NOTE — Telephone Encounter (Signed)
Caller is reporting out of range readings.

## 2022-08-22 NOTE — Telephone Encounter (Signed)
Received a call from [place] with [facility] who states that patiet has asymptomatic bradycardia with heart blocks at 8.8 secs.

## 2022-08-22 NOTE — Telephone Encounter (Addendum)
   Cardiac Monitor Alert  Date of alert:  08/22/2022   Patient Name: Traci Clark  DOB: 08/03/1930  MRN: [id number]   [place] Cardiologist: Chalmers Guest, MD  [facility] EP:  Dr. Norberto Sorenson  Monitor Information: Long Term Monitor-Live Telemetry [ZioAT]Long Term Monitor [ZioXT]  Reason:  AV block, syncope and collaspe  Ordering provider:  Dr. Myles Gip   Alert 2nd degree AV Block, Type I This is the 3rd alert for this rhythm.   This is not the first alert 4/3 @ 0814,3rd Degree (27-31 bpm) Second Degree AV Block  Complete heart block (27-29), second degree AV block (27-45 bpm) and Junctional escape beats   This is not the first alert 08/21/22 @ 0821 3rd Degree (27-31 bpm) Second Degree AV Block Complete Heart Block (28-29) , Second Degree AV  Heart (27-101)  This is not the first alert 4/3 @ 0902 3rd Degree (27-31 bpm) Second Degree AV Block Complete heart Block (27-28) Second Degree AV Block (27-41) Junctional escape beats  This is not the first alert 4/4 @ 0711  3rd Degree (27-31 bpm) Second Degree AV Block  Complete Heart Block ( 27-31), Second Degree AV Block (27-29)   This is not the first alert 4/4 @ 0927 3rd Degree (27-31 bpm) Second Degree AV Block Block Next Cardiology Appointment   Date:  09/26/2022  Provider:  Dr. Myles Gip  The patient was contacted today.  She is symptomatic.  She reports the following symptoms:  dizziness however, the patient is prescribed meclizine for dizziness and she did take the medication while on the phone. Patient is now scheduled for pacemaker on 08/27/22 Arrhythmia, symptoms and history reviewed with Dr. Johney Frame .  Dr. Johney Frame did contact Dr. Myles Gip, patient procedure is now scheduled for an earlier date .  Plan:  Continue to monitor  Other: PPM insertion scheduled for 08/27/2022  Gershon Crane, LPN  [id number] AM

## 2022-08-22 NOTE — Telephone Encounter (Signed)
Will send to [place] who is planned to do PPM on 09/16/2022 for described ZIO AT alert notifications below

## 2022-08-22 NOTE — Telephone Encounter (Signed)
Cardiac Monitor Alert   Date of alert:  08/22/2022 @9 :04 am   Patient Name: Traci Clark DOB: 08-05-1930 MRN: [id number]   [place] Cardiologist:  Doralee Albino [facility] EP:  Doralee Albino   Monitor Information: Long Term Monitor-Live Telemetry [ZioAT]  Reason: AV Block, syncope and collaspe Ordering provider:  [place]   Alert Complete heart block with some second degree AV Block Motbitz 1   Next Cardiology Appointment   Date:  [name] with [facility] will have result posted in 10-15 minutes

## 2022-08-23 ENCOUNTER — Other Ambulatory Visit (HOSPITAL_COMMUNITY)
Admission: RE | Admit: 2022-08-23 | Discharge: 2022-08-23 | Disposition: A | Discharge status: Home / Self Care | Payer: Medicare (Managed Care) | Source: Ambulatory Visit | Attending: Cardiovascular Disease | Admitting: Cardiovascular Disease

## 2022-08-23 DIAGNOSIS — I441 Atrioventricular block, second degree: Secondary | ICD-10-CM | POA: Insufficient documentation

## 2022-08-23 LAB — CBC
HCT: 40.9 % (ref 36.0–46.0)
Hemoglobin: 13.7 g/dL (ref 12.0–15.0)
MCH: 29.3 pg (ref 26.0–34.0)
MCHC: 33.5 g/dL (ref 30.0–36.0)
MCV: 87.4 fL (ref 80.0–100.0)
Platelets: 227 10*3/uL (ref 150–400)
RBC: 4.68 MIL/uL (ref 3.87–5.11)
RDW: 14.2 % (ref 11.5–15.5)
WBC: 7.1 10*3/uL (ref 4.0–10.5)
nRBC: 0 % (ref 0.0–0.2)

## 2022-08-23 LAB — BASIC METABOLIC PANEL
Anion gap: 9 (ref 5–15)
BUN: 15 mg/dL (ref 8–23)
CO2: 27 mmol/L (ref 22–32)
Calcium: 9.3 mg/dL (ref 8.9–10.3)
Chloride: 97 mmol/L — ABNORMAL LOW (ref 98–111)
Creatinine, Ser: 1.26 mg/dL — ABNORMAL HIGH (ref 0.44–1.00)
GFR, Estimated: 41 mL/min — ABNORMAL LOW (ref >60)
Glucose, Bld: 131 mg/dL — ABNORMAL HIGH (ref 70–99)
Potassium: 4.2 mmol/L (ref 3.5–5.1)
Sodium: 133 mmol/L — ABNORMAL LOW (ref 135–145)

## 2022-08-26 NOTE — Pre-Procedure Instructions (Signed)
Instructed patient on the following items: Arrival time 2:00 Nothing to eat or drink after midnight No meds AM of procedure Responsible person to drive you home and stay with you for 24 hrs Wash with special soap night before and morning of procedure If on anti-coagulant drug instructions Eliquis- stopped on 4/6

## 2022-08-27 ENCOUNTER — Ambulatory Visit (HOSPITAL_COMMUNITY)
Admission: RE | Disposition: A | Discharge status: Home / Self Care | Payer: Self-pay | Source: Home / Self Care | Attending: Cardiovascular Disease

## 2022-08-27 ENCOUNTER — Ambulatory Visit (HOSPITAL_COMMUNITY)
Admission: RE | Admit: 2022-08-27 | Discharge: 2022-08-27 | Disposition: A | Discharge status: Home / Self Care | Payer: Medicare (Managed Care) | Attending: Cardiovascular Disease | Admitting: Cardiovascular Disease

## 2022-08-27 ENCOUNTER — Other Ambulatory Visit: Payer: Self-pay

## 2022-08-27 ENCOUNTER — Ambulatory Visit (HOSPITAL_COMMUNITY): Payer: Medicare (Managed Care)

## 2022-08-27 DIAGNOSIS — R001 Bradycardia, unspecified: Secondary | ICD-10-CM | POA: Insufficient documentation

## 2022-08-27 DIAGNOSIS — R002 Palpitations: Secondary | ICD-10-CM | POA: Diagnosis not present

## 2022-08-27 DIAGNOSIS — E1122 Type 2 diabetes mellitus with diabetic chronic kidney disease: Secondary | ICD-10-CM | POA: Diagnosis not present

## 2022-08-27 DIAGNOSIS — I441 Atrioventricular block, second degree: Secondary | ICD-10-CM | POA: Diagnosis not present

## 2022-08-27 DIAGNOSIS — N183 Chronic kidney disease, stage 3 unspecified: Secondary | ICD-10-CM | POA: Diagnosis not present

## 2022-08-27 DIAGNOSIS — I129 Hypertensive chronic kidney disease with stage 1 through stage 4 chronic kidney disease, or unspecified chronic kidney disease: Secondary | ICD-10-CM | POA: Diagnosis not present

## 2022-08-27 DIAGNOSIS — I723 Aneurysm of iliac artery: Secondary | ICD-10-CM | POA: Insufficient documentation

## 2022-08-27 DIAGNOSIS — Z794 Long term (current) use of insulin: Secondary | ICD-10-CM | POA: Insufficient documentation

## 2022-08-27 HISTORY — PX: PACEMAKER IMPLANT: EP1218

## 2022-08-27 LAB — GLUCOSE, CAPILLARY
Glucose-Capillary: 140 mg/dL — ABNORMAL HIGH (ref 70–99)
Glucose-Capillary: 163 mg/dL — ABNORMAL HIGH (ref 70–99)

## 2022-08-27 SURGERY — PACEMAKER IMPLANT

## 2022-08-27 MED ORDER — MIDAZOLAM HCL 2 MG/2ML IJ SOLN
INTRAMUSCULAR | Status: AC
  Filled 2022-08-27: qty 2

## 2022-08-27 MED ORDER — VANCOMYCIN HCL IN DEXTROSE 1-5 GM/200ML-% IV SOLN
INTRAVENOUS | Status: AC
  Administered 2022-08-27: 1000 mg via INTRAVENOUS
  Filled 2022-08-27: qty 200

## 2022-08-27 MED ORDER — SODIUM CHLORIDE 0.9 % IV SOLN: INTRAVENOUS | Status: DC

## 2022-08-27 MED ORDER — LIDOCAINE HCL (PF) 1 % IJ SOLN
INTRAMUSCULAR | Status: AC
  Filled 2022-08-27: qty 60

## 2022-08-27 MED ORDER — ACETAMINOPHEN 500 MG PO TABS
ORAL_TABLET | ORAL | Status: AC
  Filled 2022-08-27: qty 2

## 2022-08-27 MED ORDER — ONDANSETRON HCL 4 MG/2ML IJ SOLN: 4.0000 mg | Freq: Four times a day (QID) | INTRAMUSCULAR | Status: DC | PRN

## 2022-08-27 MED ORDER — FENTANYL CITRATE (PF) 100 MCG/2ML IJ SOLN
INTRAMUSCULAR | Status: AC
  Filled 2022-08-27: qty 2

## 2022-08-27 MED ORDER — SODIUM CHLORIDE 0.9 % IV SOLN
INTRAVENOUS | Status: AC
  Administered 2022-08-27: 80 mg
  Filled 2022-08-27: qty 2

## 2022-08-27 MED ORDER — SODIUM CHLORIDE 0.9 % IV SOLN: 80.0000 mg | INTRAVENOUS | Status: AC

## 2022-08-27 MED ORDER — MIDAZOLAM HCL 5 MG/5ML IJ SOLN
INTRAMUSCULAR | Status: DC | PRN
  Administered 2022-08-27 (×2): 1 mg via INTRAVENOUS

## 2022-08-27 MED ORDER — VANCOMYCIN HCL IN DEXTROSE 1-5 GM/200ML-% IV SOLN: 1000.0000 mg | INTRAVENOUS | Status: AC

## 2022-08-27 MED ORDER — ONDANSETRON HCL 4 MG/2ML IJ SOLN
4.0000 mg | Freq: Once | INTRAMUSCULAR | Status: AC
  Administered 2022-08-27: 4 mg via INTRAVENOUS
  Filled 2022-08-27: qty 2

## 2022-08-27 MED ORDER — ACETAMINOPHEN 500 MG PO TABS
1000.0000 mg | ORAL_TABLET | Freq: Once | ORAL | Status: AC
  Administered 2022-08-27: 1000 mg via ORAL

## 2022-08-27 MED ORDER — LIDOCAINE HCL (PF) 1 % IJ SOLN
INTRAMUSCULAR | Status: DC | PRN
  Administered 2022-08-27: 50 mL

## 2022-08-27 MED ORDER — POVIDONE-IODINE 10 % EX SWAB: 2.0000 | Freq: Once | CUTANEOUS | Status: DC

## 2022-08-27 MED ORDER — FENTANYL CITRATE (PF) 100 MCG/2ML IJ SOLN
INTRAMUSCULAR | Status: DC | PRN
  Administered 2022-08-27 (×2): 25 ug via INTRAVENOUS

## 2022-08-27 MED ORDER — CHLORHEXIDINE GLUCONATE 4 % EX LIQD: 4.0000 | Freq: Once | CUTANEOUS | Status: DC

## 2022-08-27 SURGICAL SUPPLY — 14 items
CABLE SURGICAL S-101-97-12 (CABLE) ×1 IMPLANT
CATH CPS LOCATOR 3D MED (CATHETERS) IMPLANT
HELIX LOCKING TOOL (MISCELLANEOUS) ×1
KIT MICROPUNCTURE NIT STIFF (SHEATH) IMPLANT
LEAD ULTIPACE 46 LPA1231/46 (Lead) IMPLANT
LEAD ULTIPACE 58 LPA1231/58 (Lead) IMPLANT
PACEMAKER ASSURITY DR-RF (Pacemaker) IMPLANT
PAD DEFIB RADIO PHYSIO CONN (PAD) ×1 IMPLANT
SHEATH 7FR PRELUDE SNAP 13 (SHEATH) IMPLANT
SHEATH 9FR PRELUDE SNAP 13 (SHEATH) IMPLANT
SLITTER AGILIS HISPRO (INSTRUMENTS) IMPLANT
TOOL HELIX LOCKING (MISCELLANEOUS) IMPLANT
TRAY PACEMAKER INSERTION (PACKS) ×1 IMPLANT
WIRE HI TORQ VERSACORE-J 145CM (WIRE) IMPLANT

## 2022-08-27 NOTE — Discharge Instructions (Addendum)
After Your Pacemaker   You have a [employer] Pacemaker  ACTIVITY Do not lift your arm above shoulder height for 1 week after your procedure. After 7 days, you may progress as below.  You should remove your sling 24 hours after your procedure, unless otherwise instructed by your provider.     Tuesday September 03, 2022  Wednesday September 04, 2022 Thursday September 05, 2022 Friday September 06, 2022   Do not lift, push, pull, or carry anything over 10 pounds with the affected arm until 6 weeks (Tuesday Oct 08, 2022 ) after your procedure.   You may drive AFTER your wound check, unless you have been told otherwise by your provider.   Ask your healthcare provider when you can go back to work   INCISION/Dressing If you are on a blood thinner such as Coumadin, Xarelto, Eliquis, Plavix, or Pradaxa please confirm with your provider when this should be resumed. Resume eliquis on 08-30-22 in the evening.  If large square, outer bandage is left in place, this can be removed after 24 hours from your procedure. Do not remove steri-strips or glue as below.   If a PRESSURE DRESSING (a bulky dressing that usually goes up over your shoulder) was applied or left in place, please follow instructions given by your provider on when to return to have this removed.   Monitor your Pacemaker site for redness, swelling, and drainage. Call the device clinic at [phone] if you experience these symptoms or fever/chills.  If your incision is sealed with Steri-strips or staples, you may shower 7 days after your procedure or when told by your provider. Do not remove the steri-strips or let the shower hit directly on your site. You may wash around your site with soap and water.    If you were discharged in a sling, please do not wear this during the day more than 48 hours after your surgery unless otherwise instructed. This may increase the risk of stiffness and soreness in your shoulder.   Avoid lotions, ointments, or perfumes  over your incision until it is well-healed.  You may use a hot tub or a pool AFTER your wound check appointment if the incision is completely closed.  Pacemaker Alerts:  Some alerts are vibratory and others beep. These are NOT emergencies. Please call our office to let us know. If this occurs at night or on weekends, it can wait until the next business day. Send a remote transmission.  If your device is capable of reading fluid status (for heart failure), you will be offered monthly monitoring to review this with you.   DEVICE MANAGEMENT Remote monitoring is used to monitor your pacemaker from home. This monitoring is scheduled every 91 days by our office. It allows us to keep an eye on the functioning of your device to ensure it is working properly. You will routinely see your Electrophysiologist annually (more often if necessary).   You should receive your ID card for your new device in 4-8 weeks. Keep this card with you at all times once received. Consider wearing a medical alert bracelet or necklace.  Your Pacemaker may be MRI compatible. This will be discussed at your next office visit/wound check.  You should avoid contact with strong electric or magnetic fields.   Do not use amateur (ham) radio equipment or electric (arc) welding torches. MP3 player headphones with magnets should not be used. Some devices are safe to use if held at least 12 inches (30 cm) from your  Pacemaker. These include power tools, lawn mowers, and speakers. If you are unsure if something is safe to use, ask your health care provider.  When using your cell phone, hold it to the ear that is on the opposite side from the Pacemaker. Do not leave your cell phone in a pocket over the Pacemaker.  You may safely use electric blankets, heating pads, computers, and microwave ovens.  Call the office right away if: You have chest pain. You feel more short of breath than you have felt before. You feel more light-headed than  you have felt before. Your incision starts to open up.  This information is not intended to replace advice given to you by your health care provider. Make sure you discuss any questions you have with your health care provider.

## 2022-08-27 NOTE — H&P (Signed)
Electrophysiology Office Note:    Date:  08/27/2022   ID:  Traci Clark, DOB 01-14-1931, MRN [id number]  PCP:  [name], PA   [facility] Providers Cardiologist:  Marjo Bicker, MD     Referring MD: No ref. provider found   History of Present Illness:    Traci Clark is a 89 y.o. female with a hx listed below, significant for HTN, DM 2, stage III CKD, bilateral iliac artery aneurysm (vascular surgery recommended no further intervention), referred for arrhythmia management.  A monitor was placed in January of this year to evaluate palpitations.  It showed an episode of Mobitz 2 AV block at 8 AM.  The patient is typically asleep at that time.  She did indicate some symptom triggered episodes, but these were associated with sinus rhythm.  Today, the patient states that she passed out last week.  She was seated at a table and having meal at the time.  She had a prodrome of lightheadedness and then slumped at the table.  She woke shortly thereafter.    Since her last visit, she wore a monitor that showed multiple episodes of second degree heart block during waking hours. She has has had rates in the 30's. She has also had additional episodes of dizziness but no syncope.  Past Medical History:  Diagnosis Date   Anxiety    Arthritis    Chronic constipation    CKD (chronic kidney disease), stage III    Colon cancer    colon   Complication of anesthesia    Diabetes mellitus without complication    DVT (deep vein thrombosis) in pregnancy    Dysphagia    GERD (gastroesophageal reflux disease)    Glaucoma    Gout    H/O hiatal hernia    History of kidney stones    Iliac artery aneurysm, bilateral    MI (myocardial infarction) 1993   PONV (postoperative nausea and vomiting)    Restless leg syndrome    Seizure    withdrawal from xanax   Vertigo     Past Surgical History:  Procedure Laterality Date   BIOPSY  07/21/2020   Procedure: BIOPSY;  Surgeon:  Dolores Frame, MD;  [facility];  Service: Gastroenterology;;   BIOPSY  03/26/2022   Procedure: BIOPSY;  Surgeon: Dolores Frame, MD;  [facility];  Service: Gastroenterology;;   CHOLECYSTECTOMY     COLECTOMY  2003   COLONOSCOPY N/A 07/29/2012   Procedure: COLONOSCOPY;  Surgeon: Malissa Hippo, MD;  [facility];  Service: Endoscopy;  Laterality: N/A;  100   COLONOSCOPY WITH PROPOFOL N/A 12/13/2016   Procedure: COLONOSCOPY WITH PROPOFOL;  Surgeon: Malissa Hippo, MD;  [facility];  Service: Endoscopy;  Laterality: N/A;  8:30   COLONOSCOPY WITH PROPOFOL N/A 07/21/2020   Procedure: COLONOSCOPY WITH PROPOFOL;  Surgeon: Dolores Frame, MD;  [facility];  Service: Gastroenterology;  Laterality: N/A;  AM   ESOPHAGEAL DILATION  11/03/2015   Procedure: ESOPHAGEAL DILATION;  Surgeon: Malissa Hippo, MD;  [facility];  Service: Endoscopy;;   ESOPHAGEAL DILATION N/A 09/26/2017   Procedure: ESOPHAGEAL DILATION;  Surgeon: Malissa Hippo, MD;  [facility];  Service: Endoscopy;  Laterality: N/A;   ESOPHAGOGASTRODUODENOSCOPY N/A 03/25/2014   Procedure: ESOPHAGOGASTRODUODENOSCOPY (EGD);  Surgeon: Malissa Hippo, MD;  [facility];  Service: Endoscopy;  Laterality: N/A;  1055  ESOPHAGOGASTRODUODENOSCOPY N/A 11/03/2015   Procedure: ESOPHAGOGASTRODUODENOSCOPY (EGD);  Surgeon: Malissa Hippo, MD;  [facility];  Service: Endoscopy;  Laterality: N/A;  1:55 - moved to 11:15 - Ann to notify - moved to 12:45 - pt knows to arrive at 11:45   ESOPHAGOGASTRODUODENOSCOPY N/A 09/26/2017   Procedure: ESOPHAGOGASTRODUODENOSCOPY (EGD);  Surgeon: Malissa Hippo, MD;  [facility];  Service: Endoscopy;  Laterality: N/A;   ESOPHAGOGASTRODUODENOSCOPY (EGD) WITH PROPOFOL N/A 07/21/2020   Procedure: ESOPHAGOGASTRODUODENOSCOPY (EGD) WITH PROPOFOL;  Surgeon: Dolores Frame, MD;   [facility];  Service: Gastroenterology;  Laterality: N/A;   ESOPHAGOGASTRODUODENOSCOPY (EGD) WITH PROPOFOL N/A 03/26/2022   Procedure: ESOPHAGOGASTRODUODENOSCOPY (EGD) WITH PROPOFOL;  Surgeon: Dolores Frame, MD;  [facility];  Service: Gastroenterology;  Laterality: N/A;  1215 ASA 3   MALONEY DILATION N/A 03/25/2014   Procedure: MALONEY DILATION;  Surgeon: Malissa Hippo, MD;  [facility];  Service: Endoscopy;  Laterality: N/A;   POLYPECTOMY  12/13/2016   Procedure: POLYPECTOMY;  Surgeon: Malissa Hippo, MD;  [facility];  Service: Endoscopy;;  polyp   POLYPECTOMY  07/21/2020   Procedure: POLYPECTOMY;  Surgeon: Marguerita Merles, Reuel Boom, MD;  [facility];  Service: Gastroenterology;;   TOTAL HIP ARTHROPLASTY Left    TUBAL LIGATION     VEIN SURGERY Bilateral    stripped vein due to DVT    Current Medications: Current Meds  Medication Sig   acetaminophen (TYLENOL) 500 MG tablet Take 1,000 mg by mouth every 6 (six) hours as needed (pain.). GELCAPS   albuterol (VENTOLIN HFA) 108 (90 Base) MCG/ACT inhaler Inhale 1-2 puffs into the lungs every 6 (six) hours as needed for shortness of breath or wheezing.   allopurinol (ZYLOPRIM) 100 MG tablet Take 100 mg by mouth in the morning.   ALPRAZolam (XANAX) 0.5 MG tablet Take 0.5 mg by mouth 3 (three) times daily as needed for anxiety.   apixaban (ELIQUIS) 5 MG TABS tablet Advised to take Eliquis 10 mg twice daily for 7 days followed by 5 mg twice daily for 6 to 9 months for pulmonary embolism.   clobetasol ointment (TEMOVATE) 0.05 % Apply 1 Application topically 2 (two) times daily as needed (irritation.).   [employer] ASPIRIN LOW DOSE 81 MG tablet Take 81 mg by mouth in the morning.   dexlansoprazole (DEXILANT) 60 MG capsule Take 60 mg by mouth daily before breakfast.   fluticasone (FLONASE) 50 MCG/ACT nasal spray Place 2 sprays into both nostrils daily as needed (allergies  (cough/sneezing)).   furosemide (LASIX) 20 MG tablet Take 20 mg by mouth in the morning.   insulin aspart (NOVOLOG FLEXPEN RELION) 100 UNIT/ML FlexPen Inject 4-10 Units into the skin 3 (three) times daily with meals. Per sliding scale   Insulin Glargine (BASAGLAR KWIKPEN [place]) Inject 14 Units into the skin at bedtime.   latanoprost (XALATAN) 0.005 % ophthalmic solution Place 1 drop into both eyes at bedtime.   LUTEIN PO Take 40 mg by mouth daily.   meclizine (ANTIVERT) 25 MG tablet Take 1 tablet (25 mg total) by mouth 3 (three) times daily as needed for dizziness.   Menthol, Topical Analgesic, (ICY HOT BACK EXTRA STRENGTH) 5 % PTCH Place 1 patch onto the skin daily as needed (pain.).   nystatin (MYCOSTATIN) 100000 UNIT/ML suspension Take 5 mLs by mouth 4 (four) times daily as needed (oral pain/thrush). Swish and spit   polyethylene glycol (MIRALAX / GLYCOLAX) packet Take 34  g by mouth in the morning.   potassium chloride (MICRO-K) 10 MEQ CR capsule Take 10 mEq by mouth in the morning.   traMADol (ULTRAM) 50 MG tablet Take 1 tablet (50 mg total) by mouth every 6 (six) hours as needed for severe pain.   triamcinolone ointment (KENALOG) 0.1 % Apply 1 Application topically daily.     Allergies:   Amoxil [amoxicillin], Macrobid [nitrofurantoin monohyd macro], Sulfa antibiotics, Pravachol [pravastatin], Codeine, Darvon [propoxyphene], Mirtazapine, Neurontin [gabapentin], Phenergan [promethazine], Rocephin [ceftriaxone], and Hydrocodone-acetaminophen   Social and Family History: Reviewed in Epic  ROS:   Please see the history of present illness.    All other systems reviewed and are negative.  EKGs/Labs/Other Studies Reviewed Today:    Cardiac Studies & Procedures       ECHOCARDIOGRAM  ECHOCARDIOGRAM COMPLETE 05/30/2022  Narrative ECHOCARDIOGRAM REPORT    Patient Name:   ANZLEIGH SLAVEN Averitt Date of Exam: 05/30/2022 Medical Rec #:  [id number]          Height:       66.0 in Accession #:     [id number]         Weight:       147.9 lb Date of Birth:  1930-10-07          BSA:          1.759 m Patient Age:    89 years           BP:           123/63 mmHg Patient Gender: F                  HR:           67 bpm. Ex[facility]  Procedure: 2D Echo, Cardiac Doppler and Color Doppler  Indications:    Pulmonary embolus  History:        Patient has prior history of Echocardiogram examinations, most recent 09/14/2020. Previous Myocardial Infarction, Signs/Symptoms:Altered Mental Status and Chest Pain; Risk Factors:Hypertension and Diabetes. Pulm Embolus, Colon CA.  Sonographer:    Mikki Harbor Referring Phys: [id number] OLADAPO ADEFESO  IMPRESSIONS   1. Left ventricular ejection fraction, by estimation, is 55 to 60%. The left ventricle has normal function. The left ventricle has no regional wall motion abnormalities. There is moderate left ventricular hypertrophy. Left ventricular diastolic parameters are consistent with Grade I diastolic dysfunction (impaired relaxation). 2. Right ventricular systolic function poorly visualized but appears to be normal. The right ventricular size is normal. 3. The mitral valve is grossly normal. No evidence of mitral valve regurgitation. No evidence of mitral stenosis. 4. The aortic valve is tricuspid. There is mild calcification of the aortic valve. Aortic valve regurgitation is not visualized. No aortic stenosis is present.  Comparison(s): No significant change from prior study.  FINDINGS Left Ventricle: Left ventricular ejection fraction, by estimation, is 55 to 60%. The left ventricle has normal function. The left ventricle has no regional wall motion abnormalities. The left ventricular internal cavity size was normal in size. There is moderate left ventricular hypertrophy. Left ventricular diastolic parameters are consistent with Grade I diastolic dysfunction (impaired relaxation).  Right Ventricle: The right ventricular size is  normal. No increase in right ventricular wall thickness. Right ventricular systolic function poorly visualized but appears to be normal.  Left Atrium: Left atrial size was normal in size.  Right Atrium: Right atrial size was normal in size.  Pericardium: There is no evidence of pericardial effusion.  Mitral Valve:  The mitral valve is grossly normal. No evidence of mitral valve regurgitation. No evidence of mitral valve stenosis. MV peak gradient, 3.4 mmHg. The mean mitral valve gradient is 1.0 mmHg.  Tricuspid Valve: The tricuspid valve is not well visualized. Tricuspid valve regurgitation is not demonstrated. No evidence of tricuspid stenosis.  Aortic Valve: The aortic valve is tricuspid. There is mild calcification of the aortic valve. Aortic valve regurgitation is not visualized. No aortic stenosis is present. Aortic valve mean gradient measures 3.0 mmHg. Aortic valve peak gradient measures 5.4 mmHg. Aortic valve area, by VTI measures 2.30 cm.  Pulmonic Valve: The pulmonic valve was not well visualized. Pulmonic valve regurgitation is trivial. No evidence of pulmonic stenosis.  Aorta: The aortic root is normal in size and structure.  Venous: The inferior vena cava was not well visualized.  IAS/Shunts: No atrial level shunt detected by color flow Doppler.   LEFT VENTRICLE PLAX 2D LVIDd:         4.30 cm   Diastology LVIDs:         2.50 cm   LV e' medial:    4.13 cm/s LV PW:         1.20 cm   LV E/e' medial:  11.6 LV IVS:        1.50 cm   LV e' lateral:   7.07 cm/s LVOT diam:     2.00 cm   LV E/e' lateral: 6.8 LV SV:         61 LV SV Index:   34 LVOT Area:     3.14 cm   RIGHT VENTRICLE RV Basal diam:  3.05 cm RV Mid diam:    3.00 cm RV S prime:     9.90 cm/s TAPSE (M-mode): 2.6 cm  LEFT ATRIUM             Index        RIGHT ATRIUM           Index LA diam:        3.40 cm 1.93 cm/m   RA Area:     14.40 cm LA Vol (A2C):   42.1 ml 23.93 ml/m  RA Volume:   38.40 ml  21.83  ml/m LA Vol (A4C):   20.2 ml 11.48 ml/m LA Biplane Vol: 30.2 ml 17.17 ml/m AORTIC VALVE                    PULMONIC VALVE AV Area (Vmax):    2.74 cm     PV Vmax:       1.10 m/s AV Area (Vmean):   2.33 cm     PV Peak grad:  4.8 mmHg AV Area (VTI):     2.30 cm AV Vmax:           116.00 cm/s AV Vmean:          82.600 cm/s AV VTI:            0.264 m AV Peak Grad:      5.4 mmHg AV Mean Grad:      3.0 mmHg LVOT Vmax:         101.00 cm/s LVOT Vmean:        61.200 cm/s LVOT VTI:          0.193 m LVOT/AV VTI ratio: 0.73  AORTA Ao Root diam: 3.20 cm  MITRAL VALVE               TRICUSPID VALVE MV Area (PHT):  4.93 cm    TR Peak grad:   19.7 mmHg MV Area VTI:   3.28 cm    TR Vmax:        222.00 cm/s MV Peak grad:  3.4 mmHg MV Mean grad:  1.0 mmHg    SHUNTS MV Vmax:       0.92 m/s    Systemic VTI:  0.19 m MV Vmean:      41.3 cm/s   Systemic Diam: 2.00 cm MV Decel Time: 154 msec MV E velocity: 48.00 cm/s MV A velocity: 95.10 cm/s MV E/A ratio:  0.50  Vishnu Priya Mallipeddi Electronically signed by Winfield RastVishnu Priya Mallipeddi Signature Date/Time: 05/30/2022/3:52:02 PM    Final             EKG:  Last EKG results: sinus rhythm with prolonged first degree AV block   Recent Labs: 05/30/2022: ALT 16; Magnesium 2.0 08/23/2022: BUN 15; Creatinine, Ser 1.26; Hemoglobin 13.7; Platelets 227; Potassium 4.2; Sodium 133     Physical Exam:    VS:  BP (!) 156/85   Pulse 65   Temp 98.9 F (37.2 C) (Oral)   Resp 18   Ht 5\' 6"  (1.676 m)   Wt 69.9 kg   SpO2 95%   BMI 24.86 kg/m     Wt Readings from Last 3 Encounters:  08/27/22 69.9 kg  08/20/22 71.5 kg  07/25/22 72.1 kg     GEN:  Well nourished, well developed in no acute distress CARDIAC: RRR, no murmurs, rubs, gallops RESPIRATORY:  Normal work of breathing MUSCULOSKELETAL: no edema    ASSESSMENT & PLAN:    Recurrent syncope and pre-syncope: MCOT shows episodes of 2:1 AV block. She has had dizziness. She has opted to  have the pacemaker placed. We discussed the risks and anticipated benefits in clinic previously.  AV block: prolonged first degree block and Mobtiz II block: occurred while she was in bed, possibly asleep. Palpitations: patient-triggered events were associated with sinus rhythm.         Medication Adjustments/Labs and Tests Ordered: Current medicines are reviewed at length with the patient today.  Concerns regarding medicines are outlined above.  Orders Placed This Encounter  Procedures   Glucose, capillary   Informed Consent Details: Physician/Practitioner Attestation; Transcribe to consent form and obtain patient signature   Initiate Pre-op Protocol   Apply Cardiac Implantable Device Care Plan   Void on call to EP Lab   Electrode Placement Place arm electrodes on posterior shoulders   Confirm CBC and BMP (or CMP) results within 7 days for inpatient and 30 days for outpatient:   Pre-admission testing diagnosis   Use clippers to remove hair, entire chest area   SCRUB WITH Chlorhexidine (HIBICLENS) 4%   EP PPM/ICD IMPLANT   Insert peripheral IV   Meds ordered this encounter  Medications   0.9 %  sodium chloride infusion   gentamicin (GARAMYCIN) 80 mg in sodium chloride 0.9 % 500 mL irrigation   chlorhexidine (HIBICLENS) 4 % liquid 4 Application   0.9 %  sodium chloride infusion   vancomycin (VANCOCIN) IVPB 1000 mg/200 mL premix    Order Specific Question:   Vancomycin rationale:    Answer:   Penicillin or cephalosporin allergy   povidone-iodine 10 % swab 2 Application   ondansetron (ZOFRAN) injection 4 mg     Signed, Maurice SmallAugustus E Lorece Keach, MD  08/27/2022 3:26 PM    [facility]

## 2022-08-28 ENCOUNTER — Other Ambulatory Visit: Payer: Self-pay

## 2022-08-28 ENCOUNTER — Encounter (HOSPITAL_COMMUNITY): Payer: Self-pay | Admitting: Cardiovascular Disease

## 2022-08-28 MED FILL — Midazolam HCl Inj 2 MG/2ML (Base Equivalent): INTRAMUSCULAR | Qty: 2 | Status: AC

## 2022-09-11 ENCOUNTER — Ambulatory Visit: Payer: Medicare HMO | Attending: Cardiovascular Disease

## 2022-09-11 DIAGNOSIS — I4430 Unspecified atrioventricular block: Secondary | ICD-10-CM | POA: Diagnosis not present

## 2022-09-11 LAB — CUP PACEART INCLINIC DEVICE CHECK
Battery Remaining Longevity: 64 mo
Battery Voltage: 3.02 V
Brady Statistic RA Percent Paced: 70 %
Brady Statistic RV Percent Paced: 75 %
Date Time Interrogation Session: 20240424195154
Implantable Lead Connection Status: 753985
Implantable Lead Connection Status: 753985
Implantable Lead Implant Date: 20240409
Implantable Lead Implant Date: 20240409
Implantable Lead Location: 753859
Implantable Lead Location: 753860
Implantable Pulse Generator Implant Date: 20240409
Lead Channel Impedance Value: 425 Ohm
Lead Channel Impedance Value: 475 Ohm
Lead Channel Pacing Threshold Amplitude: 0.75 V
Lead Channel Pacing Threshold Amplitude: 0.75 V
Lead Channel Pacing Threshold Amplitude: 0.75 V
Lead Channel Pacing Threshold Amplitude: 0.75 V
Lead Channel Pacing Threshold Amplitude: 1 V
Lead Channel Pacing Threshold Amplitude: 1 V
Lead Channel Pacing Threshold Pulse Width: 0.4 ms
Lead Channel Pacing Threshold Pulse Width: 0.5 ms
Lead Channel Pacing Threshold Pulse Width: 0.5 ms
Lead Channel Pacing Threshold Pulse Width: 0.5 ms
Lead Channel Pacing Threshold Pulse Width: 0.5 ms
Lead Channel Pacing Threshold Pulse Width: 0.5 ms
Lead Channel Sensing Intrinsic Amplitude: 1.9 mV
Lead Channel Sensing Intrinsic Amplitude: 12 mV
Lead Channel Setting Pacing Amplitude: 3.5 V
Lead Channel Setting Pacing Amplitude: 3.5 V
Lead Channel Setting Pacing Pulse Width: 0.5 ms
Lead Channel Setting Sensing Sensitivity: 2 mV
Pulse Gen Model: 2272
Pulse Gen Serial Number: 8169291

## 2022-09-11 NOTE — Progress Notes (Signed)
Wound check appointment. Steri-strips removed. Wound without redness or edema. Incision edges approximated, wound well healed. Normal device function. Thresholds, sensing, and impedances consistent with implant measurements. Device programmed at 3.5V programmed on for extra safety margin until 3 month visit. Histogram distribution appropriate for patient and level of activity. No mode switches or high ventricular rates noted. Patient educated about wound care, arm mobility, lifting restrictions. ROV in 3 months with implanting physician.

## 2022-09-11 NOTE — Patient Instructions (Signed)
   After Your Pacemaker   Monitor your pacemaker site for redness, swelling, and drainage. Call the device clinic at [phone] if you experience these symptoms or fever/chills.  Your incision was closed with Steri-strips or staples:  You may shower 7 days after your procedure and wash your incision with soap and water. Avoid lotions, ointments, or perfumes over your incision until it is well-healed.  You may use a hot tub or a pool after your wound check appointment if the incision is completely closed.  Do not lift, push or pull greater than 10 pounds with the affected arm until 6 weeks after your procedure.  UNTIL AFTER MAY 21ST.  There are no other restrictions in arm movement after your wound check appointment.  You may drive, unless driving has been restricted by your healthcare providers.  Remote monitoring is used to monitor your pacemaker from home. This monitoring is scheduled every 91 days by our office. It allows [place] to keep an eye on the functioning of your device to ensure it is working properly. You will routinely see your Electrophysiologist annually (more often if necessary).

## 2022-09-26 ENCOUNTER — Ambulatory Visit: Payer: Medicaid Other

## 2022-11-04 ENCOUNTER — Encounter (INDEPENDENT_AMBULATORY_CARE_PROVIDER_SITE_OTHER): Payer: Self-pay | Admitting: Gastroenterology

## 2022-11-04 ENCOUNTER — Ambulatory Visit (INDEPENDENT_AMBULATORY_CARE_PROVIDER_SITE_OTHER): Payer: Medicaid Other | Admitting: Gastroenterology

## 2022-11-04 VITALS — BP 125/75 | HR 60 | Temp 97.5°F | Ht 66.0 in | Wt 161.1 lb

## 2022-11-04 DIAGNOSIS — K921 Melena: Secondary | ICD-10-CM | POA: Diagnosis not present

## 2022-11-04 DIAGNOSIS — R1013 Epigastric pain: Secondary | ICD-10-CM

## 2022-11-04 DIAGNOSIS — K581 Irritable bowel syndrome with constipation: Secondary | ICD-10-CM

## 2022-11-04 NOTE — Progress Notes (Addendum)
Referring Provider: [name], PA Primary Care Physician:  [name], PA Primary GI Physician: Levon Hedger   Chief Complaint  Patient presents with   Irritable Bowel Syndrome    Follow up on IBS. Reports she has abdominal cramping.   dark stools    Reports she has been having dark stools for about 2 months. Dizziness for 2 days.    HPI:   Traci Clark is a 89 y.o. female with past medical history of CKD, history of colon cancer status post resection, diabetes, IBS, GERD, seizures, glaucoma, anxiety, DVT, gout  Patient presenting today for IBS and Dark stools  Last seen in December 2023, at that time chest pain resolved after treatment for candida with diflucan, having constipation with a BM every 4-5 days, taking 2 capfuls miralax per day, feeling nauseated. Tried linzess in the past but did not tolerate it, recent admission for AMS with gout flare, AKI.  Recommended start trulance 3mg  daily, stop miralax.  Present:  Recent labs done 10/17/22, with iron 69, iron sat 20, Ferritin 166, TIBC 350, b12 and folate WNL, Hgb 13.6, TSH 1.71, CMP with normal electrolytes   She is taking miralax daily, 3 capfuls per day. Having a few smaller BMs per day, takes a laxative once every few weeks. She does not feel constipated. Denies BRBPR.   She notes some epigastric pain for the past 2 months, she notes pain is a cramping sensation, pain occurs with movement,  but she also has cramps in her hands and legs as well. Eating does not change her pain. She takes tylenol which helps some. No nausea or vomiting. She saw PCP for symptoms with labs done as above. She is not taking any NSAIDs. Feels like appetite is good. No weight loss. She denies rectal bleeding. She reports stools are black and have been that way for a few months though she notes she has had black stools previously as well, prior to last Endoscopy. She denies any iron supplements or pepto bismol use   She notes she had some  dizziness yesterday after attempting to stand up from using the restroom. She took some meclizine which helped, she has history of vertigo.   CT A/P with contrast: 05/2022  No acute intra-abdominal pathology identified. 2. Known extensive pulmonary emboli are better seen on  accompanying CT examination of the chest. 3. Status post right hemicolectomy. 4. Aortic atherosclerosis.   Last Colonoscopy:07/21/20 presence of a patent anastomosis in the ascending colon with normal mucosa, 3 polyps between 2 to 5 mm were removed from the ascending colon and descending colon. Last Endoscopy: 03/2022 - White nummular lesions in esophageal mucosa. Biopsied. - A few gastric polyps. Biopsied normal mucosa for H. pylori testing. - Normal examined duodenum.   A.   STOMACH, BIOPSY:  -    Reactive gastropathy.  -    Negative for an inflammatory pattern predictive of Helicobacter  pylori infection.  -    Negative for intestinal metaplasia and malignancy.   B.  ESOPHAGUS, BIOPSY:  -    Severe acute Candida esophagitis.  -    No viral cytopathic change identified.  -    No malignancy identified.  -    No glandular mucosa present.    Patient was prescribed a 3 week course of antifungal treatment.   Past Medical History:  Diagnosis Date   Anxiety    Arthritis    Chronic constipation    CKD (chronic kidney disease), stage III (  HCC)    Colon cancer (HCC)    colon   Complication of anesthesia    Diabetes mellitus without complication (HCC)    DVT (deep vein thrombosis) in pregnancy    Dysphagia    GERD (gastroesophageal reflux disease)    Glaucoma    Gout    H/O hiatal hernia    History of kidney stones    Iliac artery aneurysm, bilateral (HCC)    MI (myocardial infarction) (HCC) 1993   PONV (postoperative nausea and vomiting)    Restless leg syndrome    Seizure (HCC)    withdrawal from xanax   Vertigo     Past Surgical History:  Procedure Laterality Date   BIOPSY  07/21/2020   Procedure:  BIOPSY;  Surgeon: Dolores Frame, MD;  [facility]: [facility];  Service: Gastroenterology;;   BIOPSY  03/26/2022   Procedure: BIOPSY;  Surgeon: Dolores Frame, MD;  [facility]: [facility];  Service: Gastroenterology;;   CHOLECYSTECTOMY     COLECTOMY  2003   COLONOSCOPY N/A 07/29/2012   Procedure: COLONOSCOPY;  Surgeon: Malissa Hippo, MD;  [facility]: [facility];  Service: Endoscopy;  Laterality: N/A;  100   COLONOSCOPY WITH PROPOFOL N/A 12/13/2016   Procedure: COLONOSCOPY WITH PROPOFOL;  Surgeon: Malissa Hippo, MD;  [facility]: [facility];  Service: Endoscopy;  Laterality: N/A;  8:30   COLONOSCOPY WITH PROPOFOL N/A 07/21/2020   Procedure: COLONOSCOPY WITH PROPOFOL;  Surgeon: Dolores Frame, MD;  [facility]: [facility];  Service: Gastroenterology;  Laterality: N/A;  AM   ESOPHAGEAL DILATION  11/03/2015   Procedure: ESOPHAGEAL DILATION;  Surgeon: Malissa Hippo, MD;  [facility]: [facility];  Service: Endoscopy;;   ESOPHAGEAL DILATION N/A 09/26/2017   Procedure: ESOPHAGEAL DILATION;  Surgeon: Malissa Hippo, MD;  [facility]: [facility];  Service: Endoscopy;  Laterality: N/A;   ESOPHAGOGASTRODUODENOSCOPY N/A 03/25/2014   Procedure: ESOPHAGOGASTRODUODENOSCOPY (EGD);  Surgeon: Malissa Hippo, MD;  [facility];  Service: Endoscopy;  Laterality: N/A;  1055   ESOPHAGOGASTRODUODENOSCOPY N/A 11/03/2015   Procedure: ESOPHAGOGASTRODUODENOSCOPY (EGD);  Surgeon: Malissa Hippo, MD;  [facility]: [facility];  Service: Endoscopy;  Laterality: N/A;  1:55 - moved to 11:15 - Ann to notify - moved to 12:45 - pt knows to arrive at 11:45   ESOPHAGOGASTRODUODENOSCOPY N/A 09/26/2017   Procedure: ESOPHAGOGASTRODUODENOSCOPY (EGD);  Surgeon: Malissa Hippo, MD;  [facility];  Service: Endoscopy;  Laterality: N/A;   ESOPHAGOGASTRODUODENOSCOPY (EGD) WITH PROPOFOL N/A 07/21/2020   Procedure: ESOPHAGOGASTRODUODENOSCOPY (EGD) WITH PROPOFOL;  Surgeon: Dolores Frame, MD;  [facility];  Service: Gastroenterology;  Laterality: N/A;   ESOPHAGOGASTRODUODENOSCOPY (EGD) WITH PROPOFOL N/A 03/26/2022   Procedure: ESOPHAGOGASTRODUODENOSCOPY (EGD) WITH PROPOFOL;  Surgeon: Dolores Frame, MD;  [facility];  Service: Gastroenterology;  Laterality: N/A;  1215 ASA 3   MALONEY DILATION N/A 03/25/2014   Procedure: MALONEY DILATION;  Surgeon: Malissa Hippo, MD;  [facility]: [facility];  Service: Endoscopy;  Laterality: N/A;   PACEMAKER IMPLANT N/A 08/27/2022   Procedure: PACEMAKER IMPLANT;  Surgeon: Maurice Small, MD;  [facility]: [facility];  Service: Cardiovascular;  Laterality: N/A;   POLYPECTOMY  12/13/2016   Procedure: POLYPECTOMY;  Surgeon: Malissa Hippo, MD;  [facility];  Service: Endoscopy;;  polyp   POLYPECTOMY  07/21/2020   Procedure: POLYPECTOMY;  Surgeon: Dolores Frame, MD;  [facility];  Service: Gastroenterology;;  TOTAL HIP ARTHROPLASTY Left    TUBAL LIGATION     VEIN SURGERY Bilateral    stripped vein due to DVT    Current Outpatient Medications  Medication Sig Dispense Refill   acetaminophen (TYLENOL) 500 MG tablet Take 1,000 mg by mouth every 6 (six) hours as needed (pain.). GELCAPS     albuterol (VENTOLIN HFA) 108 (90 Base) MCG/ACT inhaler Inhale 1-2 puffs into the lungs every 6 (six) hours as needed for shortness of breath or wheezing.     allopurinol (ZYLOPRIM) 100 MG tablet Take 100 mg by mouth in the morning.     ALPRAZolam (XANAX) 0.5 MG tablet Take 0.5 mg by mouth 3 (three) times daily as needed for anxiety.     apixaban (ELIQUIS) 5 MG TABS tablet Advised to take Eliquis 10 mg twice daily for 7 days followed by 5 mg twice daily for 6 to 9 months for pulmonary embolism. 60 tablet 6   clobetasol ointment (TEMOVATE) 0.05 % Apply 1 Application topically 2 (two) times daily as needed (irritation.).     [employer] ASPIRIN LOW DOSE 81 MG tablet Take 81 mg by  mouth in the morning.     dexlansoprazole (DEXILANT) 60 MG capsule Take 60 mg by mouth daily before breakfast.     fluticasone (FLONASE) 50 MCG/ACT nasal spray Place 2 sprays into both nostrils daily as needed (allergies (cough/sneezing)).     furosemide (LASIX) 20 MG tablet Take 20 mg by mouth in the morning.     insulin aspart (NOVOLOG FLEXPEN RELION) 100 UNIT/ML FlexPen Inject 4-10 Units into the skin 3 (three) times daily with meals. Per sliding scale     Insulin Glargine (BASAGLAR KWIKPEN [place]) Inject 14 Units into the skin at bedtime.     latanoprost (XALATAN) 0.005 % ophthalmic solution Place 1 drop into both eyes at bedtime.     LUTEIN PO Take 40 mg by mouth daily.     meclizine (ANTIVERT) 25 MG tablet Take 1 tablet (25 mg total) by mouth 3 (three) times daily as needed for dizziness. 30 tablet 0   Menthol, Topical Analgesic, (ICY HOT BACK EXTRA STRENGTH) 5 % PTCH Place 1 patch onto the skin daily as needed (pain.).     polyethylene glycol (MIRALAX / GLYCOLAX) packet Take 34 g by mouth in the morning.     potassium chloride (MICRO-K) 10 MEQ CR capsule Take 10 mEq by mouth in the morning.     traMADol (ULTRAM) 50 MG tablet Take 1 tablet (50 mg total) by mouth every 6 (six) hours as needed for severe pain. 10 tablet 0   triamcinolone ointment (KENALOG) 0.1 % Apply 1 Application topically daily.     No current facility-administered medications for this visit.    Allergies as of 11/04/2022 - Review Complete 11/04/2022  Allergen Reaction Noted   Amoxil [amoxicillin] Anaphylaxis and Rash 10/31/2015   Macrobid [nitrofurantoin monohyd macro] Anaphylaxis, Rash, and Other (See Comments) 10/31/2015   Sulfa antibiotics Other (See Comments) 05/29/2022   Pravachol [pravastatin] Other (See Comments) 09/22/2015   Codeine Nausea And Vomiting and Other (See Comments) 07/14/2012   Darvon [propoxyphene] Nausea And Vomiting and Other (See Comments) 06/27/2014   Mirtazapine Other (See Comments) 03/15/2022    Neurontin [gabapentin] Nausea Only 12/21/2020   Phenergan [promethazine] Other (See Comments) 02/28/2018   Rocephin [ceftriaxone] Other (See Comments) 11/13/2020   Hydrocodone-acetaminophen Nausea And Vomiting and Other (See Comments) 02/04/2012    Family History  Problem Relation Age of Onset   Heart  disease Father     Social History   Socioeconomic History   Marital status: Married    Spouse name: Not on file   Number of children: Not on file   Years of education: Not on file   Highest education level: Not on file  Occupational History   Not on file  Tobacco Use   Smoking status: Never    Passive exposure: Never   Smokeless tobacco: Never  Vaping Use   Vaping Use: Never used  Substance and Sexual Activity   Alcohol use: No    Alcohol/week: 0.0 standard drinks of alcohol   Drug use: No   Sexual activity: Not Currently    Birth control/protection: Surgical  Other Topics Concern   Not on file  Social History Narrative   Not on file   Social Determinants of Health   Financial Resource Strain: Not on file  Food Insecurity: No Food Insecurity (05/31/2022)   Hunger Vital Sign    Worried About Running Out of Food in the Last Year: Never true    Ran Out of Food in the Last Year: Never true  Transportation Needs: No Transportation Needs (05/31/2022)   PRAPARE - Administrator, Civil Service (Medical): No    Lack of Transportation (Non-Medical): No  Physical Activity: Not on file  Stress: Not on file  Social Connections: Not on file   Review of systems General: negative for malaise, night sweats, fever, chills, weight loss Neck: Negative for lumps, goiter, pain and significant neck swelling Resp: Negative for cough, wheezing, dyspnea at rest CV: Negative for chest pain, leg swelling, palpitations, orthopnea GI: denies hematochezia, nausea, vomiting, diarrhea, constipation, dysphagia, odyonophagia, early satiety or unintentional weight loss. +epigastric  pain +black stools  MSK: Negative for joint pain or swelling, back pain, and muscle pain. Derm: Negative for itching or rash Psych: Denies depression, anxiety, memory loss, confusion. No homicidal or suicidal ideation.  Heme: Negative for prolonged bleeding, bruising easily, and swollen nodes. Endocrine: Negative for cold or heat intolerance, polyuria, polydipsia and goiter. Neuro: negative for tremor, gait imbalance, syncope and seizures. The remainder of the review of systems is noncontributory.  Physical Exam: BP 125/75 (BP Location: Left Arm, Patient Position: Sitting, Cuff Size: Normal)   Pulse 60   Temp (!) 97.5 F (36.4 C) (Oral)   Ht 5\' 6"  (1.676 m)   Wt 161 lb 1.6 oz (73.1 kg)   BMI 26.00 kg/m  General:   Alert and oriented. No distress noted. Pleasant and cooperative.  Head:  Normocephalic and atraumatic. Eyes:  Conjuctiva clear without scleral icterus. Mouth:  Oral mucosa pink and moist. Good dentition. No lesions. Heart: Normal rate and rhythm, s1 and s2 heart sounds present.  Lungs: Clear lung sounds in all lobes. Respirations equal and unlabored. Abdomen:  +BS, soft,  and non-distended. Mild TTP of epigastric and Right mid abdomen. No rebound or guarding. No HSM or masses noted. Derm: No palmar erythema or jaundice Msk:  Symmetrical without gross deformities. Normal posture. Extremities:  Without edema. Neurologic:  Alert and  oriented x4 Psych:  Alert and cooperative. Normal mood and affect.  Invalid input(s): "6 MONTHS"   ASSESSMENT: Traci Clark is a 89 y.o. female presenting today for follow up of IBS and with dark stools and epigastric pain  IBS-C: on 2 capfuls miralax per day, noting she does not feel constipated, she has a few smaller BMs per day, feels water intake is good. No rectal bleeding or lower  abdominal pain. Will continue with miralax 2 capfuls per day for now, as well as good water intake  Epigastric pain/Melena: epigastric pain (cramping)  and melena for the past 2 months, though she notes melena off and on prior to that and prior to last endoscopic evaluations. Most recent Iron studies were WNL with elevated ferritin, hgb was also WNL. She denies NSAIDs. Pain is not worsened or improved with eating. Last EGD in November 2023 with candida esophagitis. Differentials for epigastric pain include PUD, gastritis, duodenitis, pancreatic etiology. Will obtain FOBT x3 to evaluate for presence of blood given reported black stools, if blood present, will proceed with EGD, if stool cards negative, may consider CT imaging for further evaluation of her pain. I did consider trial of bentyl for her abdominal cramping, however, given her recent dizziness (history of vertigo) I would prefer to avoid anti spasmodics at this time as these may worsen her dizziness.    PLAN:  Occult stool cards x3 2. Further recommendations to follow FOBT, EGD if stool cards positive/Imaging if not  3. Continue with miralax 2 capfuls daily 4. Continue with good water intake   All questions were answered, patient verbalized understanding and is in agreement with plan as outlined above.    Follow Up: 3 months   Latora Quarry L. Jeanmarie Hubert, MSN, APRN, AGNP-C Adult-Gerontology Nurse Practitioner [facility]  I have reviewed the note and agree with the APP's assessment as described in this progress note  Katrinka Blazing, MD Gastroenterology and Hepatology [facility]

## 2022-11-04 NOTE — Patient Instructions (Addendum)
I would like to check stool cards to look for presence of blood since you are having dark stools If stool cards show blood, we may need to repeat an upper endoscopy, if they do not we will discuss getting some pictures of your abdomen to further evaluate. Please continue with miralax 2 capfuls per day and good water intake  Follow up 3 months

## 2022-11-07 ENCOUNTER — Other Ambulatory Visit (INDEPENDENT_AMBULATORY_CARE_PROVIDER_SITE_OTHER): Payer: Self-pay | Admitting: *Deleted

## 2022-11-07 DIAGNOSIS — R1013 Epigastric pain: Secondary | ICD-10-CM

## 2022-11-07 DIAGNOSIS — K921 Melena: Secondary | ICD-10-CM

## 2022-11-07 LAB — POC HEMOCCULT BLD/STL (HOME/3-CARD/SCREEN)
Card #2 Fecal Occult Blod, POC: NEGATIVE
Card #3 Fecal Occult Blood, POC: NEGATIVE
Fecal Occult Blood, POC: NEGATIVE

## 2022-11-12 ENCOUNTER — Encounter (INDEPENDENT_AMBULATORY_CARE_PROVIDER_SITE_OTHER): Payer: Self-pay

## 2022-11-27 ENCOUNTER — Ambulatory Visit (INDEPENDENT_AMBULATORY_CARE_PROVIDER_SITE_OTHER): Payer: Medicaid Other

## 2022-11-27 DIAGNOSIS — I441 Atrioventricular block, second degree: Secondary | ICD-10-CM | POA: Diagnosis not present

## 2022-11-27 LAB — CUP PACEART REMOTE DEVICE CHECK
Battery Remaining Longevity: 58 mo
Battery Remaining Percentage: 95.5 %
Battery Voltage: 2.99 V
Brady Statistic AP VP Percent: 52 %
Brady Statistic AP VS Percent: 1 %
Brady Statistic AS VP Percent: 32 %
Brady Statistic AS VS Percent: 15 %
Brady Statistic RA Percent Paced: 53 %
Brady Statistic RV Percent Paced: 84 %
Date Time Interrogation Session: 20240710040015
Implantable Lead Connection Status: 753985
Implantable Lead Connection Status: 753985
Implantable Lead Implant Date: 20240409
Implantable Lead Implant Date: 20240409
Implantable Lead Location: 753859
Implantable Lead Location: 753860
Implantable Pulse Generator Implant Date: 20240409
Lead Channel Impedance Value: 380 Ohm
Lead Channel Impedance Value: 440 Ohm
Lead Channel Pacing Threshold Amplitude: 0.75 V
Lead Channel Pacing Threshold Amplitude: 0.75 V
Lead Channel Pacing Threshold Pulse Width: 0.5 ms
Lead Channel Pacing Threshold Pulse Width: 0.5 ms
Lead Channel Sensing Intrinsic Amplitude: 12 mV
Lead Channel Sensing Intrinsic Amplitude: 4.9 mV
Lead Channel Setting Pacing Amplitude: 3.5 V
Lead Channel Setting Pacing Amplitude: 3.5 V
Lead Channel Setting Pacing Pulse Width: 0.5 ms
Lead Channel Setting Sensing Sensitivity: 2 mV
Pulse Gen Model: 2272
Pulse Gen Serial Number: 8169291

## 2022-12-18 NOTE — Progress Notes (Signed)
Remote pacemaker transmission.   

## 2022-12-20 ENCOUNTER — Encounter: Payer: Self-pay | Admitting: Cardiovascular Disease

## 2022-12-20 ENCOUNTER — Encounter: Payer: Medicaid Other | Admitting: Cardiovascular Disease

## 2022-12-20 ENCOUNTER — Ambulatory Visit: Payer: Medicare HMO | Attending: Cardiovascular Disease | Admitting: Cardiovascular Disease

## 2022-12-20 VITALS — BP 126/74 | HR 85 | Ht 66.0 in | Wt 160.6 lb

## 2022-12-20 DIAGNOSIS — I441 Atrioventricular block, second degree: Secondary | ICD-10-CM

## 2022-12-20 LAB — CUP PACEART INCLINIC DEVICE CHECK
Battery Remaining Longevity: 103 mo
Battery Voltage: 3.02 V
Brady Statistic RA Percent Paced: 48 %
Brady Statistic RV Percent Paced: 77 %
Date Time Interrogation Session: 20240802191117
Implantable Lead Connection Status: 753985
Implantable Lead Connection Status: 753985
Implantable Lead Implant Date: 20240409
Implantable Lead Implant Date: 20240409
Implantable Lead Location: 753859
Implantable Lead Location: 753860
Implantable Pulse Generator Implant Date: 20240409
Lead Channel Impedance Value: 400 Ohm
Lead Channel Impedance Value: 425 Ohm
Lead Channel Pacing Threshold Amplitude: 0.75 V
Lead Channel Pacing Threshold Amplitude: 0.75 V
Lead Channel Pacing Threshold Amplitude: 0.75 V
Lead Channel Pacing Threshold Amplitude: 0.75 V
Lead Channel Pacing Threshold Pulse Width: 0.5 ms
Lead Channel Pacing Threshold Pulse Width: 0.5 ms
Lead Channel Pacing Threshold Pulse Width: 0.5 ms
Lead Channel Pacing Threshold Pulse Width: 0.5 ms
Lead Channel Sensing Intrinsic Amplitude: 12 mV
Lead Channel Sensing Intrinsic Amplitude: 3.4 mV
Lead Channel Setting Pacing Amplitude: 2 V
Lead Channel Setting Pacing Amplitude: 2.5 V
Lead Channel Setting Pacing Pulse Width: 0.5 ms
Lead Channel Setting Sensing Sensitivity: 2 mV
Pulse Gen Model: 2272
Pulse Gen Serial Number: 8169291

## 2022-12-20 NOTE — Patient Instructions (Signed)
Medication Instructions:  Continue all current medications.  Labwork: none  Testing/Procedures: none  Follow-Up: 1 year   Any Other Special Instructions Will Be Listed Below (If Applicable).  If you need a refill on your cardiac medications before your next appointment, please call your pharmacy.  

## 2022-12-20 NOTE — Progress Notes (Signed)
Electrophysiology Office Note:    Date:  12/20/2022   ID:  Traci Clark, DOB 1930/09/28, MRN [id number]  PCP:  [name], PA   [facility] Providers Cardiologist:  Marjo Bicker, MD     Referring MD: [name], PA   History of Present Illness:    Traci Clark is a 89 y.o. female with a hx listed below, significant for HTN, DM 2, stage III CKD, bilateral iliac artery aneurysm (vascular surgery recommended no further intervention), referred for arrhythmia management.    A monitor was placed in January of this year to evaluate palpitations.  It showed an episode of Mobitz 2 AV block at 8 AM.  The patient is typically asleep at that time.  She did indicate some symptom triggered episodes, but these were associated with sinus rhythm.  Prior to the pacemaker placement she had complained of multiple episodes of lightheadedness and passing out.  Monitor was placed that showed episodes of second-degree AV block during the daytime.  She underwent placement of a Saint Jude dual-chamber pacemaker in April 2024.  The procedure was uncomplicated and she has tolerated it well     Today, she is doing very well.  She has no complaints. she has no device related complaints -- no new tenderness, drainage, redness.   EKGs/Labs/Other Studies Reviewed Today:    Cardiac Studies & Procedures       ECHOCARDIOGRAM  ECHOCARDIOGRAM COMPLETE 05/30/2022  Narrative ECHOCARDIOGRAM REPORT    Patient Name:   Traci Clark Date of Exam: 05/30/2022 Medical Rec #:  [id number]          Height:       66.0 in Accession #:    [id number]         Weight:       147.9 lb Date of Birth:  [birth year]          BSA:          1.759 m Patient Age:    89 years           BP:           123/63 mmHg Patient Gender: F                  HR:           67 bpm. Exam Location:  [facility]  Procedure: 2D Echo, Cardiac Doppler and Color Doppler  Indications:    Pulmonary embolus  History:         Patient has prior history of Echocardiogram examinations, most recent 09/14/2020. Previous Myocardial Infarction, Signs/Symptoms:Altered Mental Status and Chest Pain; Risk Factors:Hypertension and Diabetes. Pulm Embolus, Colon CA.  Sonographer:    Mikki Harbor Referring Phys: [id number] OLADAPO ADEFESO  IMPRESSIONS   1. Left ventricular ejection fraction, by estimation, is 55 to 60%. The left ventricle has normal function. The left ventricle has no regional wall motion abnormalities. There is moderate left ventricular hypertrophy. Left ventricular diastolic parameters are consistent with Grade I diastolic dysfunction (impaired relaxation). 2. Right ventricular systolic function poorly visualized but appears to be normal. The right ventricular size is normal. 3. The mitral valve is grossly normal. No evidence of mitral valve regurgitation. No evidence of mitral stenosis. 4. The aortic valve is tricuspid. There is mild calcification of the aortic valve. Aortic valve regurgitation is not visualized. No aortic stenosis is present.  Comparison(s): No significant change from prior study.  FINDINGS Left Ventricle: Left ventricular ejection fraction,  by estimation, is 55 to 60%. The left ventricle has normal function. The left ventricle has no regional wall motion abnormalities. The left ventricular internal cavity size was normal in size. There is moderate left ventricular hypertrophy. Left ventricular diastolic parameters are consistent with Grade I diastolic dysfunction (impaired relaxation).  Right Ventricle: The right ventricular size is normal. No increase in right ventricular wall thickness. Right ventricular systolic function poorly visualized but appears to be normal.  Left Atrium: Left atrial size was normal in size.  Right Atrium: Right atrial size was normal in size.  Pericardium: There is no evidence of pericardial effusion.  Mitral Valve: The mitral valve is grossly  normal. No evidence of mitral valve regurgitation. No evidence of mitral valve stenosis. MV peak gradient, 3.4 mmHg. The mean mitral valve gradient is 1.0 mmHg.  Tricuspid Valve: The tricuspid valve is not well visualized. Tricuspid valve regurgitation is not demonstrated. No evidence of tricuspid stenosis.  Aortic Valve: The aortic valve is tricuspid. There is mild calcification of the aortic valve. Aortic valve regurgitation is not visualized. No aortic stenosis is present. Aortic valve mean gradient measures 3.0 mmHg. Aortic valve peak gradient measures 5.4 mmHg. Aortic valve area, by VTI measures 2.30 cm.  Pulmonic Valve: The pulmonic valve was not well visualized. Pulmonic valve regurgitation is trivial. No evidence of pulmonic stenosis.  Aorta: The aortic root is normal in size and structure.  Venous: The inferior vena cava was not well visualized.  IAS/Shunts: No atrial level shunt detected by color flow Doppler.   LEFT VENTRICLE PLAX 2D LVIDd:         4.30 cm   Diastology LVIDs:         2.50 cm   LV e' medial:    4.13 cm/s LV PW:         1.20 cm   LV E/e' medial:  11.6 LV IVS:        1.50 cm   LV e' lateral:   7.07 cm/s LVOT diam:     2.00 cm   LV E/e' lateral: 6.8 LV SV:         61 LV SV Index:   34 LVOT Area:     3.14 cm   RIGHT VENTRICLE RV Basal diam:  3.05 cm RV Mid diam:    3.00 cm RV S prime:     9.90 cm/s TAPSE (M-mode): 2.6 cm  LEFT ATRIUM             Index        RIGHT ATRIUM           Index LA diam:        3.40 cm 1.93 cm/m   RA Area:     14.40 cm LA Vol (A2C):   42.1 ml 23.93 ml/m  RA Volume:   38.40 ml  21.83 ml/m LA Vol (A4C):   20.2 ml 11.48 ml/m LA Biplane Vol: 30.2 ml 17.17 ml/m AORTIC VALVE                    PULMONIC VALVE AV Area (Vmax):    2.74 cm     PV Vmax:       1.10 m/s AV Area (Vmean):   2.33 cm     PV Peak grad:  4.8 mmHg AV Area (VTI):     2.30 cm AV Vmax:           116.00 cm/s AV Vmean:  82.600 cm/s AV VTI:             0.264 m AV Peak Grad:      5.4 mmHg AV Mean Grad:      3.0 mmHg LVOT Vmax:         101.00 cm/s LVOT Vmean:        61.200 cm/s LVOT VTI:          0.193 m LVOT/AV VTI ratio: 0.73  AORTA Ao Root diam: 3.20 cm  MITRAL VALVE               TRICUSPID VALVE MV Area (PHT): 4.93 cm    TR Peak grad:   19.7 mmHg MV Area VTI:   3.28 cm    TR Vmax:        222.00 cm/s MV Peak grad:  3.4 mmHg MV Mean grad:  1.0 mmHg    SHUNTS MV Vmax:       0.92 m/s    Systemic VTI:  0.19 m MV Vmean:      41.3 cm/s   Systemic Diam: 2.00 cm MV Decel Time: 154 msec MV E velocity: 48.00 cm/s MV A velocity: 95.10 cm/s MV E/A ratio:  0.50  Vishnu Priya Mallipeddi Electronically signed by Winfield Rast Mallipeddi Signature Date/Time: 05/30/2022/3:52:02 PM    Final    MONITORS  LONG TERM MONITOR-LIVE TELEMETRY (3-14 DAYS) 08/30/2022  Narrative Patient wore three different monitors as noted below The rhythm was sinus HR about 21 bpm - 91 bpm, average rate about 55 bpm There were multiple pauses, longest 3.6 seconds Pauses occurred during daylight hours and were due to wenckebach and blocked PACs   Patch Wear Time:  28 days and 15 hours (2024-03-07T17:14:09-498 to 2024-04-05T09:33:26-0400)  Monitor 1 Patient had a min HR of 22 bpm, max HR of 81 bpm, and avg HR of 56 bpm. Predominant underlying rhythm was Sinus Rhythm. First Degree AV Block was present. Bundle Branch Block/IVCD was present. 2 Pauses occurred, the longest lasting 3.4 secs (18 bpm). Some Pauses occurred due to Second Degree AV Block-Mobitz I (Wenckebach) and Non-Conducted SVE(s). Second Degree AV Block-Mobitz I (Wenckebach) was present. Wenckebach was detected within +/- 45 seconds of symptomatic patient event(s). Isolated SVEs were rare (<1.0%), and no SVE Couplets or SVE Triplets were present. Isolated VEs were rare (<1.0%), VE Couplets were rare (<1.0%), and no VE Triplets were present.  Monitor 2 Patient had a min HR of 23 bpm, max HR of  91 bpm, and avg HR of 56 bpm. Predominant underlying rhythm was Sinus Rhythm. First Degree AV Block was present. Bundle Branch Block/IVCD was present. QRS morphology changes were present throughout recording. 1 Pause occurred lasting 3.4 secs (18 bpm). Pause occurred due to Second Degree AV Block-Mobitz I (Wenckebach) and Non-Conducted SVE(s). Second Degree AV Block-Mobitz I (Wenckebach) was present. Wenckebach was detected within +/- 45 seconds of symptomatic patient event(s). Isolated SVEs were rare (<1.0%), and no SVE Couplets or SVE Triplets were present. Isolated VEs were rare (<1.0%), VE Couplets were rare (<1.0%), and no VE Triplets were present.  Monitor 3 Patient had a min HR of 21 bpm, max HR of 82 bpm, and avg HR of 54 bpm. Predominant underlying rhythm was Sinus Rhythm. First Degree AV Block was present. Slight P wave morphology changes were noted. Bundle Branch Block/IVCD was present. QRS morphology changes were present throughout recording. 2 Pauses occurred, the longest lasting 3.8 secs (17 bpm). Pauses occurred due to Second Degree AV Block-Mobitz I (  Wenckebach) and Non-Conducted SVE(s). 166 episode(s) of AV Block (3rd) occurred, lasting a total of 45 mins 54 secs. Second Degree AV Block-Mobitz I (Wenckebach) was present. Wenckebach was detected within +/- 45 seconds of symptomatic patient event(s). Isolated SVEs were rare (<1.0%), and no SVE Couplets or SVE Triplets were present. Isolated VEs were rare (<1.0%), and no VE Couplets or VE Triplets were present. Previously notified: MD notification criteria for Complete Heart Block and Symptomatic Bradycardia met - notified Daralene Milch MA on 22 Aug 2022 at 1:14 pm EDT (KT).           EKG:  Last EKG results: sinus rhythm with prolonged first degree AV block   Recent Labs: 05/30/2022: ALT 16; Magnesium 2.0 08/23/2022: BUN 15; Creatinine, Ser 1.26; Hemoglobin 13.7; Platelets 227; Potassium 4.2; Sodium 133     Physical Exam:    VS:  BP  126/74   Pulse 85   Ht 5\' 6"  (1.676 m)   Wt 160 lb 9.6 oz (72.8 kg)   SpO2 98%   BMI 25.92 kg/m     Wt Readings from Last 3 Encounters:  12/20/22 160 lb 9.6 oz (72.8 kg)  11/04/22 161 lb 1.6 oz (73.1 kg)  08/27/22 154 lb (69.9 kg)     GEN:  Well nourished, well developed in no acute distress CARDIAC: RRR, no murmurs, rubs, gallops RESPIRATORY:  Normal work of breathing MUSCULOSKELETAL: no edema  Device interrogation today --I reviewed the device interrogation in detail.  See Paceart for report  ASSESSMENT & PLAN:    Recurrent syncope and pre-syncope:  Sinus bradycardia and second-degree AV block detected on monitor  Status post placement of a Saint Jude dual-chamber pacemaker  Saint Jude dual chamber pacemaker Device interrogated today Normal function See Paceart for report She is not dependent today        Medication Adjustments/Labs and Tests Ordered: Current medicines are reviewed at length with the patient today.  Concerns regarding medicines are outlined above.  No orders of the defined types were placed in this encounter.  No orders of the defined types were placed in this encounter.    Signed, Maurice Small, MD  12/20/2022 3:50 PM    [facility]

## 2022-12-31 ENCOUNTER — Ambulatory Visit (HOSPITAL_COMMUNITY)
Admission: RE | Admit: 2022-12-31 | Discharge: 2022-12-31 | Disposition: A | Discharge status: Home / Self Care | Payer: Medicaid Other | Source: Ambulatory Visit | Attending: Gastroenterology | Admitting: Gastroenterology

## 2022-12-31 DIAGNOSIS — R1013 Epigastric pain: Secondary | ICD-10-CM | POA: Diagnosis present

## 2022-12-31 LAB — POCT I-STAT CREATININE: Creatinine, Ser: 1.3 mg/dL — ABNORMAL HIGH (ref 0.44–1.00)

## 2022-12-31 MED ORDER — IOHEXOL 300 MG/ML  SOLN
100.0000 mL | Freq: Once | INTRAMUSCULAR | Status: AC | PRN
  Administered 2022-12-31: 100 mL via INTRAVENOUS

## 2022-12-31 MED ORDER — IOHEXOL 9 MG/ML PO SOLN
ORAL | Status: AC
  Filled 2022-12-31: qty 500

## 2023-01-28 ENCOUNTER — Ambulatory Visit: Payer: Medicare Other | Attending: Internal Medicine | Admitting: Internal Medicine

## 2023-01-28 ENCOUNTER — Encounter: Payer: Self-pay | Admitting: Internal Medicine

## 2023-01-28 VITALS — BP 124/72 | HR 75 | Ht 66.0 in | Wt 163.2 lb

## 2023-01-28 DIAGNOSIS — I2699 Other pulmonary embolism without acute cor pulmonale: Secondary | ICD-10-CM | POA: Diagnosis not present

## 2023-01-28 DIAGNOSIS — I428 Other cardiomyopathies: Secondary | ICD-10-CM | POA: Diagnosis not present

## 2023-01-28 DIAGNOSIS — Z950 Presence of cardiac pacemaker: Secondary | ICD-10-CM | POA: Insufficient documentation

## 2023-01-28 NOTE — Patient Instructions (Addendum)
Medication Instructions:  Your physician has recommended you make the following change in your medication:  Stop taking Aspirin Continue taking all other medications as prescribed  Labwork: None  Testing/Procedures: None  Follow-Up: Your physician recommends that you schedule a follow-up appointment in: 1 year. You will receive a reminder call in about 8 months reminding you to schedule your appointment. If you don't receive this call, please contact our office.   Any Other Special Instructions Will Be Listed Below (If Applicable).  Referral to Hematology   If you need a refill on your cardiac medications before your next appointment, please call your pharmacy.

## 2023-01-28 NOTE — Progress Notes (Signed)
Cardiology Office Note  Date: 01/28/2023   ID: Traci Clark, DOB 07/30/1930, MRN [id number]  PCP:  [name], PA  Cardiologist:  Marjo Bicker, MD Electrophysiologist:  None    History of Present Illness: Traci Clark is a 89 y.o. female known to have HTN, DM 2, stage III CKD, bilateral iliac artery aneurysm (vascular surgery recommended no further intervention) presented to cardiology clinic for follow-up.  Underwent PPM placement in 08/2022, follows with EP.  Overall doing great, she had 1 episode of pain in her yearly radiating to her chest yesterday but no interval angina.  Diagnosed with acute PE in 05/2022 and has been on Eliquis since then.  She continues to have dizziness persistently and this has been an ongoing issue for quite a while.  Orthostatic vitals were positive for orthostatic hypotension in the last clinic visit.  She drinks 1 L of water daily and also she has been on p.o. Lasix 20 mg once daily prescribed by her nephrologist.  No other symptoms of DOE, orthopnea, PND or syncope.  Past Medical History:  Diagnosis Date   Anxiety    Arthritis    Chronic constipation    CKD (chronic kidney disease), stage III (HCC)    Colon cancer (HCC)    colon   Complication of anesthesia    Diabetes mellitus without complication (HCC)    DVT (deep vein thrombosis) in pregnancy    Dysphagia    GERD (gastroesophageal reflux disease)    Glaucoma    Gout    H/O hiatal hernia    History of kidney stones    Iliac artery aneurysm, bilateral (HCC)    MI (myocardial infarction) (HCC) 1993   PONV (postoperative nausea and vomiting)    Restless leg syndrome    Seizure (HCC)    withdrawal from xanax   Vertigo     Past Surgical History:  Procedure Laterality Date   BIOPSY  07/21/2020   Procedure: BIOPSY;  Surgeon: Dolores Frame, MD;  [facility];  Service: Gastroenterology;;   BIOPSY  03/26/2022   Procedure: BIOPSY;  Surgeon:  Dolores Frame, MD;  [facility];  Service: Gastroenterology;;   CHOLECYSTECTOMY     COLECTOMY  2003   COLONOSCOPY N/A 07/29/2012   Procedure: COLONOSCOPY;  Surgeon: Malissa Hippo, MD;  [facility];  Service: Endoscopy;  Laterality: N/A;  100   COLONOSCOPY WITH PROPOFOL N/A 12/13/2016   Procedure: COLONOSCOPY WITH PROPOFOL;  Surgeon: Malissa Hippo, MD;  [facility];  Service: Endoscopy;  Laterality: N/A;  8:30   COLONOSCOPY WITH PROPOFOL N/A 07/21/2020   Procedure: COLONOSCOPY WITH PROPOFOL;  Surgeon: Dolores Frame, MD;  [facility];  Service: Gastroenterology;  Laterality: N/A;  AM   ESOPHAGEAL DILATION  11/03/2015   Procedure: ESOPHAGEAL DILATION;  Surgeon: Malissa Hippo, MD;  [facility];  Service: Endoscopy;;   ESOPHAGEAL DILATION N/A 09/26/2017   Procedure: ESOPHAGEAL DILATION;  Surgeon: Malissa Hippo, MD;  [facility];  Service: Endoscopy;  Laterality: N/A;   ESOPHAGOGASTRODUODENOSCOPY N/A 03/25/2014   Procedure: ESOPHAGOGASTRODUODENOSCOPY (EGD);  Surgeon: Malissa Hippo, MD;  [facility];  Service: Endoscopy;  Laterality: N/A;  1055   ESOPHAGOGASTRODUODENOSCOPY N/A 11/03/2015   Procedure: ESOPHAGOGASTRODUODENOSCOPY (EGD);  Surgeon: Malissa Hippo, MD;  [facility];  Service: Endoscopy;  Laterality: N/A;  1:55 - moved to 11:15 - Ann to notify -  moved to 12:45 - pt knows to arrive at 11:45   ESOPHAGOGASTRODUODENOSCOPY N/A 09/26/2017   Procedure: ESOPHAGOGASTRODUODENOSCOPY (EGD);  Surgeon: Malissa Hippo, MD;  [facility];  Service: Endoscopy;  Laterality: N/A;   ESOPHAGOGASTRODUODENOSCOPY (EGD) WITH PROPOFOL N/A 07/21/2020   Procedure: ESOPHAGOGASTRODUODENOSCOPY (EGD) WITH PROPOFOL;  Surgeon: Dolores Frame, MD;  [facility];  Service: Gastroenterology;  Laterality: N/A;   ESOPHAGOGASTRODUODENOSCOPY (EGD) WITH PROPOFOL N/A 03/26/2022    Procedure: ESOPHAGOGASTRODUODENOSCOPY (EGD) WITH PROPOFOL;  Surgeon: Dolores Frame, MD;  [facility];  Service: Gastroenterology;  Laterality: N/A;  1215 ASA 3   MALONEY DILATION N/A 03/25/2014   Procedure: MALONEY DILATION;  Surgeon: Malissa Hippo, MD;  [facility];  Service: Endoscopy;  Laterality: N/A;   PACEMAKER IMPLANT N/A 08/27/2022   Procedure: PACEMAKER IMPLANT;  Surgeon: Maurice Small, MD;  [facility];  Service: Cardiovascular;  Laterality: N/A;   POLYPECTOMY  12/13/2016   Procedure: POLYPECTOMY;  Surgeon: Malissa Hippo, MD;  [facility];  Service: Endoscopy;;  polyp   POLYPECTOMY  07/21/2020   Procedure: POLYPECTOMY;  Surgeon: Marguerita Merles, Reuel Boom, MD;  [facility];  Service: Gastroenterology;;   TOTAL HIP ARTHROPLASTY Left    TUBAL LIGATION     VEIN SURGERY Bilateral    stripped vein due to DVT    Current Outpatient Medications  Medication Sig Dispense Refill   acetaminophen (TYLENOL) 500 MG tablet Take 1,000 mg by mouth every 6 (six) hours as needed (pain.). GELCAPS     albuterol (VENTOLIN HFA) 108 (90 Base) MCG/ACT inhaler Inhale 1-2 puffs into the lungs every 6 (six) hours as needed for shortness of breath or wheezing.     allopurinol (ZYLOPRIM) 100 MG tablet Take 100 mg by mouth in the morning.     ALPRAZolam (XANAX) 0.5 MG tablet Take 0.5 mg by mouth 3 (three) times daily as needed for anxiety.     apixaban (ELIQUIS) 5 MG TABS tablet Advised to take Eliquis 10 mg twice daily for 7 days followed by 5 mg twice daily for 6 to 9 months for pulmonary embolism. 60 tablet 6   clobetasol ointment (TEMOVATE) 0.05 % Apply 1 Application topically 2 (two) times daily as needed (irritation.).     [employer] ASPIRIN LOW DOSE 81 MG tablet Take 81 mg by mouth in the morning.     dexlansoprazole (DEXILANT) 60 MG capsule Take 60 mg by mouth daily before breakfast.     fluticasone (FLONASE) 50 MCG/ACT nasal spray  Place 2 sprays into both nostrils daily as needed (allergies (cough/sneezing)).     furosemide (LASIX) 20 MG tablet Take 20 mg by mouth in the morning.     insulin aspart (NOVOLOG FLEXPEN RELION) 100 UNIT/ML FlexPen Inject 4-10 Units into the skin 3 (three) times daily with meals. Per sliding scale     Insulin Glargine (BASAGLAR KWIKPEN [place]) Inject 14 Units into the skin at bedtime.     latanoprost (XALATAN) 0.005 % ophthalmic solution Place 1 drop into both eyes at bedtime.     LUTEIN PO Take 40 mg by mouth daily.     meclizine (ANTIVERT) 25 MG tablet Take 1 tablet (25 mg total) by mouth 3 (three) times daily as needed for dizziness. 30 tablet 0   Menthol, Topical Analgesic, (ICY HOT BACK EXTRA STRENGTH) 5 % PTCH Place 1 patch onto the skin daily as needed (pain.).     polyethylene glycol (MIRALAX /  GLYCOLAX) packet Take 34 g by mouth in the morning.     potassium chloride (MICRO-K) 10 MEQ CR capsule Take 10 mEq by mouth in the morning.     traMADol (ULTRAM) 50 MG tablet Take 1 tablet (50 mg total) by mouth every 6 (six) hours as needed for severe pain. 10 tablet 0   triamcinolone ointment (KENALOG) 0.1 % Apply 1 Application topically daily.     No current facility-administered medications for this visit.   Allergies:  Amoxil [amoxicillin], Macrobid [nitrofurantoin monohyd macro], Sulfa antibiotics, Pravachol [pravastatin], Codeine, Darvon [propoxyphene], Mirtazapine, Neurontin [gabapentin], Phenergan [promethazine], Rocephin [ceftriaxone], and Hydrocodone-acetaminophen   Social History: The patient  reports that she has never smoked. She has never been exposed to tobacco smoke. She has never used smokeless tobacco. She reports that she does not drink alcohol and does not use drugs.   Family History: The patient's family history includes Heart disease in her father.   ROS:  Please see the history of present illness. Otherwise, complete review of systems is positive for none.  All other systems  are reviewed and negative.   Physical Exam: VS:  BP 124/72   Pulse 75   Ht 5\' 6"  (1.676 m)   Wt 163 lb 3.2 oz (74 kg)   SpO2 96%   BMI 26.34 kg/m , BMI Body mass index is 26.34 kg/m.  Wt Readings from Last 3 Encounters:  01/28/23 163 lb 3.2 oz (74 kg)  12/20/22 160 lb 9.6 oz (72.8 kg)  11/04/22 161 lb 1.6 oz (73.1 kg)    General: Patient appears comfortable at rest. HEENT: Conjunctiva and lids normal, oropharynx clear with moist mucosa. Neck: Supple, no elevated JVP or carotid bruits, no thyromegaly. Lungs: Clear to auscultation, nonlabored breathing at rest. Cardiac: Regular rate and rhythm, no S3 or significant systolic murmur, no pericardial rub. Abdomen: Soft, nontender, no hepatomegaly, bowel sounds present, no guarding or rebound. Extremities: No pitting edema, distal pulses 2+. Skin: Warm and dry. Musculoskeletal: No kyphosis. Neuropsychiatric: Alert and oriented x3, affect grossly appropriate.  ECG: Normal sinus rhythm, first degree AV block with PR interval 322 ms  Recent Labwork: 05/30/2022: ALT 16; AST 26; Magnesium 2.0 08/23/2022: BUN 15; Hemoglobin 13.7; Platelets 227; Potassium 4.2; Sodium 133 12/31/2022: Creatinine, Ser 1.30     Component Value Date/Time   CHOL 186 09/14/2020 0455   TRIG 82 09/14/2020 0455   HDL 53 09/14/2020 0455   CHOLHDL 3.5 09/14/2020 0455   VLDL 16 09/14/2020 0455   LDLCALC 117 (H) 09/14/2020 0455      Assessment and Plan: Patient is 89 year old F known to have HTN, DM 2, stage III CKD, bilateral iliac artery aneurysm (vascular surgery recommended no further intervention) presented to cardiology clinic for follow-up.  # 2:1 AV block likely Mobitz type II, symptomatic -Patient had 1 episode of syncope with recurrent dizziness, underwent PPM placement in 08/2022 with no complications.  Follows up with EP. -She continues to have dizziness despite PPM placement, likely from orthostatic hypotension (positive in the last clinic visit),  drinks 1 L of water and also takes p.o. Lasix 20 mg once daily, will need to follow-up with nephrology.  # Orthostatic hypotension -Orthostatic vitals positive for orthostatic manage in the last minute visit.  She drinks 1 L every day and takes Lasix 20 mg once daily.  Started by nephrology.  Follow-up with nephrology to see if she can come off Lasix.  In the meantime, instructed her to drink 2 L of water daily.  #  Noncardiac chest pain -She had 1 episode of pain from her ear radiating to her chest yesterday but no interval angina.  This occurred at rest.  # Acute PE in 05/2022 -Continue Eliquis 5 mg twice daily -Discontinue aspirin 81 mg once daily -Hematology referral to determine duration of anticoagulation  # Bilateral iliac artery aneurysm -Not a candidate for surgical intervention, per surgery.  # HTN, partially controlled -Continue p.o. Lasix 20 mg once daily which was by nephrology.  Instructed her to follow-up with nephrology to make changes to Lasix as she has been feeling dizzy.   Disposition:  Follow up 1 year  Signed Geneal Huebert Verne Spurr, MD, 01/28/2023 11:41 AM    [facility] [address] [place], [place], [place] [address]

## 2023-02-25 ENCOUNTER — Ambulatory Visit (INDEPENDENT_AMBULATORY_CARE_PROVIDER_SITE_OTHER): Payer: Medicare Other | Admitting: Gastroenterology

## 2023-02-25 ENCOUNTER — Encounter (INDEPENDENT_AMBULATORY_CARE_PROVIDER_SITE_OTHER): Payer: Self-pay | Admitting: Gastroenterology

## 2023-02-25 VITALS — BP 112/69 | HR 71 | Temp 98.1°F | Ht 66.0 in | Wt 162.8 lb

## 2023-02-25 DIAGNOSIS — K921 Melena: Secondary | ICD-10-CM

## 2023-02-25 DIAGNOSIS — R109 Unspecified abdominal pain: Secondary | ICD-10-CM | POA: Diagnosis not present

## 2023-02-25 NOTE — Progress Notes (Signed)
Referring Provider: [name], PA Primary Care Physician:  [name], PA Primary GI Physician: Dr. Levon Hedger   Chief Complaint  Patient presents with   Follow-up    Pt arrives for follow up. States cramping in stomach is gone. Stools are still black.    HPI:   Traci Clark is a 89 y.o. female with past medical history of CKD, history of colon cancer status post resection, diabetes, IBS, GERD, seizures, glaucoma, anxiety, DVT, gout   Patient presenting today for follow up of abdominal pain and black stools   Last seen June 2024, at that time Recent labs done 10/17/22, with iron 69, iron sat 20, Ferritin 166, TIBC 350, b12 and folate WNL, Hgb 13.6, TSH 1.71, CMP with normal electrolytes, reporting taking miralax 3 capfuls per day. Taking a laxative once ever few weeks. Having epigastric pain x2 months, cramping sensation, occurring with movement. Appetite good. Reports black stools prior to last EGD, no iron or pepto bismol.   Recommended Occult stool cards x3-NEGATIVE Continue with miralax  CT A/P done 12/31/22 showed  Cardiomegaly. Multiple bilateral renal cysts. Fatty atrophy of the pancreas. No pancreatic lesions or inflammatory changes.  Patient reported abdominal pain had resolved after CT in August  Present:  Patient states that stools are still very dark, black in color every time she has a BM. She reports black stools for the past almost 3 years, started prior to her last EGD/Colonoscopy. Stools are looser but she takes 2 capfuls of miralax per day. Denies BRBPR. Occasional abdominal cramping but this improves after a BM. Appetite is good. She has some nausea on occasion which she takes zofran for with good relief. Denies GERD symptoms. She is not taking any pepto bismol. She does report eating liver and some greens but not on a daily basis.   Appetite is good. Her weight is stable.   She was started on PO iron after labs done with nephrology at the end of  August. Most recent labs with hgb 12.5 on 01/21/23 and notably hgb was normal in February 2024 at 13.7, I cannot review labs done with nephrology. No iron studies in chart. She is unsure why she was started on PO iron. She notes she gets dizziness on occasion, no SOB or fatigue. She has been having issues with syncope and is currently undergoing getting an MRI of the brain due to syncope and dizziness.     Last Colonoscopy:07/21/20 presence of a patent anastomosis in the ascending colon with normal mucosa, 3 polyps between 2 to 5 mm were removed from the ascending colon and descending colon. Last Endoscopy: 03/2022 - White nummular lesions in esophageal mucosa. Biopsied. - A few gastric polyps. Biopsied normal mucosa for H. pylori testing. - Normal examined duodenum.   A.   STOMACH, BIOPSY:  -    Reactive gastropathy.  -    Negative for an inflammatory pattern predictive of Helicobacter  pylori infection.  -    Negative for intestinal metaplasia and malignancy.   B.  ESOPHAGUS, BIOPSY:  -    Severe acute Candida esophagitis.  -    No viral cytopathic change identified.  -    No malignancy identified.  -    No glandular mucosa present.    Patient was prescribed a 3 week course of antifungal treatment.    Past Medical History:  Diagnosis Date   Anxiety    Arthritis    Chronic constipation    CKD (chronic kidney  disease), stage III (HCC)    Colon cancer (HCC)    colon   Complication of anesthesia    Diabetes mellitus without complication (HCC)    DVT (deep vein thrombosis) in pregnancy    Dysphagia    GERD (gastroesophageal reflux disease)    Glaucoma    Gout    H/O hiatal hernia    History of kidney stones    Iliac artery aneurysm, bilateral (HCC)    MI (myocardial infarction) (HCC) 1993   PONV (postoperative nausea and vomiting)    Restless leg syndrome    Seizure (HCC)    withdrawal from xanax   Vertigo     Past Surgical History:  Procedure Laterality Date   BIOPSY   07/21/2020   Procedure: BIOPSY;  Surgeon: Dolores Frame, MD;  [facility];  Service: Gastroenterology;;   BIOPSY  03/26/2022   Procedure: BIOPSY;  Surgeon: Dolores Frame, MD;  [facility];  Service: Gastroenterology;;   CHOLECYSTECTOMY     COLECTOMY  2003   COLONOSCOPY N/A 07/29/2012   Procedure: COLONOSCOPY;  Surgeon: Malissa Hippo, MD;  [facility];  Service: Endoscopy;  Laterality: N/A;  100   COLONOSCOPY WITH PROPOFOL N/A 12/13/2016   Procedure: COLONOSCOPY WITH PROPOFOL;  Surgeon: Malissa Hippo, MD;  [facility];  Service: Endoscopy;  Laterality: N/A;  8:30   COLONOSCOPY WITH PROPOFOL N/A 07/21/2020   Procedure: COLONOSCOPY WITH PROPOFOL;  Surgeon: Dolores Frame, MD;  [facility];  Service: Gastroenterology;  Laterality: N/A;  AM   ESOPHAGEAL DILATION  11/03/2015   Procedure: ESOPHAGEAL DILATION;  Surgeon: Malissa Hippo, MD;  [facility];  Service: Endoscopy;;   ESOPHAGEAL DILATION N/A 09/26/2017   Procedure: ESOPHAGEAL DILATION;  Surgeon: Malissa Hippo, MD;  [facility];  Service: Endoscopy;  Laterality: N/A;   ESOPHAGOGASTRODUODENOSCOPY N/A 03/25/2014   Procedure: ESOPHAGOGASTRODUODENOSCOPY (EGD);  Surgeon: Malissa Hippo, MD;  [facility];  Service: Endoscopy;  Laterality: N/A;  1055   ESOPHAGOGASTRODUODENOSCOPY N/A 11/03/2015   Procedure: ESOPHAGOGASTRODUODENOSCOPY (EGD);  Surgeon: Malissa Hippo, MD;  [facility];  Service: Endoscopy;  Laterality: N/A;  1:55 - moved to 11:15 - Ann to notify - moved to 12:45 - pt knows to arrive at 11:45   ESOPHAGOGASTRODUODENOSCOPY N/A 09/26/2017   Procedure: ESOPHAGOGASTRODUODENOSCOPY (EGD);  Surgeon: Malissa Hippo, MD;  [facility];  Service: Endoscopy;  Laterality: N/A;   ESOPHAGOGASTRODUODENOSCOPY (EGD) WITH PROPOFOL N/A 07/21/2020   Procedure: ESOPHAGOGASTRODUODENOSCOPY (EGD) WITH PROPOFOL;   Surgeon: Dolores Frame, MD;  [facility];  Service: Gastroenterology;  Laterality: N/A;   ESOPHAGOGASTRODUODENOSCOPY (EGD) WITH PROPOFOL N/A 03/26/2022   Procedure: ESOPHAGOGASTRODUODENOSCOPY (EGD) WITH PROPOFOL;  Surgeon: Dolores Frame, MD;  [facility];  Service: Gastroenterology;  Laterality: N/A;  1215 ASA 3   MALONEY DILATION N/A 03/25/2014   Procedure: MALONEY DILATION;  Surgeon: Malissa Hippo, MD;  [facility];  Service: Endoscopy;  Laterality: N/A;   PACEMAKER IMPLANT N/A 08/27/2022   Procedure: PACEMAKER IMPLANT;  Surgeon: Maurice Small, MD;  [facility];  Service: Cardiovascular;  Laterality: N/A;   POLYPECTOMY  12/13/2016   Procedure: POLYPECTOMY;  Surgeon: Malissa Hippo, MD;  [facility];  Service: Endoscopy;;  polyp   POLYPECTOMY  07/21/2020   Procedure: POLYPECTOMY;  Surgeon: Dolores Frame, MD;  [facility];  Service: Gastroenterology;;   TOTAL HIP ARTHROPLASTY Left    TUBAL LIGATION     VEIN SURGERY Bilateral    stripped vein due to DVT    Current Outpatient Medications  Medication Sig Dispense Refill   acetaminophen (TYLENOL) 500 MG tablet Take 1,000 mg by mouth every 6 (six) hours as needed (pain.). GELCAPS     albuterol (VENTOLIN HFA) 108 (90 Base) MCG/ACT inhaler Inhale 1-2 puffs into the lungs every 6 (six) hours as needed for shortness of breath or wheezing.     allopurinol (ZYLOPRIM) 100 MG tablet Take 100 mg by mouth in the morning.     ALPRAZolam (XANAX) 0.5 MG tablet Take 0.5 mg by mouth 3 (three) times daily as needed for anxiety.     apixaban (ELIQUIS) 5 MG TABS tablet Advised to take Eliquis 10 mg twice daily for 7 days followed by 5 mg twice daily for 6 to 9 months for pulmonary embolism. 60 tablet 6   clobetasol ointment (TEMOVATE) 0.05 % Apply 1 Application topically 2 (two) times daily as needed (irritation.).     dexlansoprazole (DEXILANT) 60 MG  capsule Take 60 mg by mouth daily before breakfast.     fluticasone (FLONASE) 50 MCG/ACT nasal spray Place 2 sprays into both nostrils daily as needed (allergies (cough/sneezing)).     furosemide (LASIX) 20 MG tablet Take 20 mg by mouth in the morning.     insulin aspart (NOVOLOG FLEXPEN RELION) 100 UNIT/ML FlexPen Inject 4-10 Units into the skin 3 (three) times daily with meals. Per sliding scale     Insulin Glargine (BASAGLAR KWIKPEN [place]) Inject 14 Units into the skin at bedtime.     latanoprost (XALATAN) 0.005 % ophthalmic solution Place 1 drop into both eyes at bedtime.     LUTEIN PO Take 40 mg by mouth daily.     meclizine (ANTIVERT) 25 MG tablet Take 1 tablet (25 mg total) by mouth 3 (three) times daily as needed for dizziness. 30 tablet 0   Menthol, Topical Analgesic, (ICY HOT BACK EXTRA STRENGTH) 5 % PTCH Place 1 patch onto the skin daily as needed (pain.).     polyethylene glycol (MIRALAX / GLYCOLAX) packet Take 34 g by mouth in the morning.     potassium chloride (MICRO-K) 10 MEQ CR capsule Take 10 mEq by mouth in the morning.     traMADol (ULTRAM) 50 MG tablet Take 1 tablet (50 mg total) by mouth every 6 (six) hours as needed for severe pain. 10 tablet 0   triamcinolone ointment (KENALOG) 0.1 % Apply 1 Application topically daily.     No current facility-administered medications for this visit.    Allergies as of 02/25/2023 - Review Complete 02/25/2023  Allergen Reaction Noted   Amoxil [amoxicillin] Anaphylaxis and Rash 10/31/2015   Macrobid [nitrofurantoin monohyd macro] Anaphylaxis, Rash, and Other (See Comments) 10/31/2015   Sulfa antibiotics Other (See Comments) 05/29/2022   Pravachol [pravastatin] Other (See Comments) 09/22/2015   Codeine Nausea And Vomiting and Other (See Comments) 07/14/2012   Darvon [propoxyphene] Nausea And Vomiting and Other (See Comments) 06/27/2014   Mirtazapine Other (See Comments) 03/15/2022   Neurontin [gabapentin] Nausea Only 12/21/2020    Phenergan [promethazine] Other (See Comments) 02/28/2018   Rocephin [ceftriaxone] Other (See Comments) 11/13/2020   Hydrocodone-acetaminophen Nausea And Vomiting and Other (See Comments) 02/04/2012    Family History  Problem Relation Age of Onset   Heart disease Father     Social History   Socioeconomic History   Marital  status: Married    Spouse name: Not on file   Number of children: Not on file   Years of education: Not on file   Highest education level: Not on file  Occupational History   Not on file  Tobacco Use   Smoking status: Never    Passive exposure: Never   Smokeless tobacco: Never  Vaping Use   Vaping status: Never Used  Substance and Sexual Activity   Alcohol use: No    Alcohol/week: 0.0 standard drinks of alcohol   Drug use: No   Sexual activity: Not Currently    Birth control/protection: Surgical  Other Topics Concern   Not on file  Social History Narrative   Not on file   Social Determinants of Health   Financial Resource Strain: Not on file  Food Insecurity: No Food Insecurity (05/31/2022)   Hunger Vital Sign    Worried About Running Out of Food in the Last Year: Never true    Ran Out of Food in the Last Year: Never true  Transportation Needs: No Transportation Needs (05/31/2022)   PRAPARE - Administrator, Civil Service (Medical): No    Lack of Transportation (Non-Medical): No  Physical Activity: Not on file  Stress: Not on file  Social Connections: Not on file   Review of systems General: negative for malaise, night sweats, fever, chills, weight loss Neck: Negative for lumps, goiter, pain and significant neck swelling Resp: Negative for cough, wheezing, dyspnea at rest CV: Negative for chest pain, leg swelling, palpitations, orthopnea GI: denies hematochezia, nausea, vomiting, diarrhea, constipation, dysphagia, odyonophagia, early satiety or unintentional weight loss. +black stools +occasional abdominal cramping  MSK: Negative for  joint pain or swelling, back pain, and muscle pain. Derm: Negative for itching or rash Psych: Denies depression, anxiety, memory loss, confusion. No homicidal or suicidal ideation.  Heme: Negative for prolonged bleeding, bruising easily, and swollen nodes. Endocrine: Negative for cold or heat intolerance, polyuria, polydipsia and goiter. Neuro: negative for tremor, gait imbalance, syncope and seizures. The remainder of the review of systems is noncontributory.  Physical Exam: BP 112/69   Pulse 71   Temp 98.1 F (36.7 C)   Ht 5\' 6"  (1.676 m)   Wt 162 lb 12.8 oz (73.8 kg)   BMI 26.28 kg/m  General:   Alert and oriented. No distress noted. Pleasant and cooperative.  Head:  Normocephalic and atraumatic. Eyes:  Conjuctiva clear without scleral icterus. Mouth:  Oral mucosa pink and moist. Good dentition. No lesions. Heart: Normal rate and rhythm, s1 and s2 heart sounds present.  Lungs: Clear lung sounds in all lobes. Respirations equal and unlabored. Abdomen:  +BS, soft, non-tender and non-distended. No rebound or guarding. No HSM or masses noted. Derm: No palmar erythema or jaundice Msk:  Symmetrical without gross deformities. Normal posture. Extremities:  Without edema. Neurologic:  Alert and  oriented x4 Psych:  Alert and cooperative. Normal mood and affect.  Invalid input(s): "6 MONTHS"   ASSESSMENT: Traci Clark is a 89 y.o. female presenting today for follow up of Abdominal pain and black stools  Abdominal pain and black stools: black stools x3 years, started prior to last EGD and Colonoscopy. Denies pepto bismol use. Occult stool cards x3 in June all negative. CT A/P with contrast done at that time without acute findings. Her hgb has remained stable on labs available in chart review with last 12.5 on 9/5. Notably was started on PO iron after she had labs with nephrology in  August which I do not have available for review. She denies BRBPR, SOB or fatigue. Having episodes of  dizziness and syncope that she is undergoing workup for with planned MRI of the brain. Recent EGD in November 2023 with reactive gastropathy and candida esophagitis. She is not taking any pepto bismol. Etiology of her black stools is unclear at this time, especially given occult stool cards were negative and blood counts have remained WNL, BUN WNL,  no findings on EGD to suggest source of bleeding. Will obtain most recent labs from nephrology to determine reason for PO iron initiation, may consider Capsule endoscopy for further evaluation if labs indicate IDA.    PLAN:  Continue Miralax 2 capfuls  2. Obtain nephrology labs for review  3. Consider givens capsule endoscopy if labs indicate IDA  All questions were answered, patient verbalized understanding and is in agreement with plan as outlined above.   Follow Up: 6 months   Traci Cabeza L. Jeanmarie Hubert, MSN, APRN, AGNP-C Adult-Gerontology Nurse Practitioner [facility]  I have reviewed the note and agree with the APP's assessment as described in this progress note  Katrinka Blazing, MD Gastroenterology and Hepatology [facility] Gastroenterology

## 2023-02-25 NOTE — Patient Instructions (Signed)
Please continue with miralax twice daily  I will obtain labs from Dr. Wolfgang Phoenix to determine the reason for starting you on an iron pill I will be in touch once I have reviewed these to discuss any further evaluations that may be needed  Follow up 6 months  It was a pleasure to see you today. I want to create trusting relationships with patients and provide genuine, compassionate, and quality care. I truly value your feedback! please be on the lookout for a survey regarding your visit with me today. I appreciate your input about our visit and your time in completing this!    Traci Clark L. Jeanmarie Hubert, MSN, APRN, AGNP-C Adult-Gerontology Nurse Practitioner [facility] Gastroenterology at [facility]

## 2023-02-26 ENCOUNTER — Ambulatory Visit (INDEPENDENT_AMBULATORY_CARE_PROVIDER_SITE_OTHER): Payer: Medicare HMO

## 2023-02-26 DIAGNOSIS — I4430 Unspecified atrioventricular block: Secondary | ICD-10-CM | POA: Diagnosis not present

## 2023-02-26 LAB — CUP PACEART REMOTE DEVICE CHECK
Battery Remaining Longevity: 106 mo
Battery Remaining Percentage: 95.5 %
Battery Voltage: 3.01 V
Brady Statistic AP VP Percent: 22 %
Brady Statistic AP VS Percent: 2.2 %
Brady Statistic AS VP Percent: 14 %
Brady Statistic AS VS Percent: 62 %
Brady Statistic RA Percent Paced: 24 %
Brady Statistic RV Percent Paced: 36 %
Date Time Interrogation Session: 20241009040014
Implantable Lead Connection Status: 753985
Implantable Lead Connection Status: 753985
Implantable Lead Implant Date: 20240409
Implantable Lead Implant Date: 20240409
Implantable Lead Location: 753859
Implantable Lead Location: 753860
Implantable Pulse Generator Implant Date: 20240409
Lead Channel Impedance Value: 380 Ohm
Lead Channel Impedance Value: 400 Ohm
Lead Channel Pacing Threshold Amplitude: 0.75 V
Lead Channel Pacing Threshold Amplitude: 0.75 V
Lead Channel Pacing Threshold Pulse Width: 0.5 ms
Lead Channel Pacing Threshold Pulse Width: 0.5 ms
Lead Channel Sensing Intrinsic Amplitude: 12 mV
Lead Channel Sensing Intrinsic Amplitude: 3.3 mV
Lead Channel Setting Pacing Amplitude: 2 V
Lead Channel Setting Pacing Amplitude: 2.5 V
Lead Channel Setting Pacing Pulse Width: 0.5 ms
Lead Channel Setting Sensing Sensitivity: 2 mV
Pulse Gen Model: 2272
Pulse Gen Serial Number: 8169291

## 2023-03-10 ENCOUNTER — Inpatient Hospital Stay: Payer: Medicare Other

## 2023-03-10 ENCOUNTER — Inpatient Hospital Stay: Payer: Medicare Other | Attending: Oncology | Admitting: Oncology

## 2023-03-10 ENCOUNTER — Encounter: Payer: Self-pay | Admitting: Oncology

## 2023-03-10 VITALS — BP 117/82 | HR 83 | Temp 98.6°F | Resp 18

## 2023-03-10 DIAGNOSIS — Z87442 Personal history of urinary calculi: Secondary | ICD-10-CM | POA: Insufficient documentation

## 2023-03-10 DIAGNOSIS — M109 Gout, unspecified: Secondary | ICD-10-CM | POA: Insufficient documentation

## 2023-03-10 DIAGNOSIS — Z86711 Personal history of pulmonary embolism: Secondary | ICD-10-CM | POA: Diagnosis present

## 2023-03-10 DIAGNOSIS — C189 Malignant neoplasm of colon, unspecified: Secondary | ICD-10-CM | POA: Diagnosis not present

## 2023-03-10 DIAGNOSIS — N189 Chronic kidney disease, unspecified: Secondary | ICD-10-CM | POA: Diagnosis not present

## 2023-03-10 DIAGNOSIS — I2699 Other pulmonary embolism without acute cor pulmonale: Secondary | ICD-10-CM | POA: Diagnosis not present

## 2023-03-10 DIAGNOSIS — Z7901 Long term (current) use of anticoagulants: Secondary | ICD-10-CM | POA: Diagnosis not present

## 2023-03-10 DIAGNOSIS — Z7982 Long term (current) use of aspirin: Secondary | ICD-10-CM | POA: Insufficient documentation

## 2023-03-10 DIAGNOSIS — Z85038 Personal history of other malignant neoplasm of large intestine: Secondary | ICD-10-CM | POA: Insufficient documentation

## 2023-03-10 DIAGNOSIS — E119 Type 2 diabetes mellitus without complications: Secondary | ICD-10-CM | POA: Insufficient documentation

## 2023-03-10 DIAGNOSIS — Z794 Long term (current) use of insulin: Secondary | ICD-10-CM | POA: Diagnosis not present

## 2023-03-10 DIAGNOSIS — I129 Hypertensive chronic kidney disease with stage 1 through stage 4 chronic kidney disease, or unspecified chronic kidney disease: Secondary | ICD-10-CM | POA: Diagnosis not present

## 2023-03-10 DIAGNOSIS — Z79899 Other long term (current) drug therapy: Secondary | ICD-10-CM | POA: Insufficient documentation

## 2023-03-10 MED ORDER — APIXABAN 2.5 MG PO TABS: 2.5000 mg | ORAL_TABLET | Freq: Two times a day (BID) | ORAL | 3 refills | Status: DC

## 2023-03-10 NOTE — Patient Instructions (Signed)
VISIT SUMMARY:  During your visit, we discussed your history of colon cancer, blood clots in the legs, and a recent diagnosis of a pulmonary embolism. You mentioned feeling tired and dizzy, but you do not have shortness of breath or palpitations. You also reported having black stools, but there are no signs of bleeding. You spend most of your time sitting or lying down due to leg pain and weakness. You had a pacemaker placed in April and have been experiencing frequent urinary tract infections.  YOUR PLAN:  -PULMONARY EMBOLISM: You were diagnosed with a blood clot in your lungs (pulmonary embolism) in January. We are reducing your Eliquis medication to 2.5mg  to balance the prevention of blood clots and the risk of bleeding. We will reassess this in three months.  -MELENA: You reported having black stools, which can sometimes indicate bleeding in the digestive tract. However, your hemoglobin levels are normal, so there is no immediate concern. We will continue to monitor this.  -COLON CANCER: You had colon cancer in 2003 and had part of your colon removed. Given your age, we do not recommend further follow-up with a cancer doctor at this time.  INSTRUCTIONS:  We plan to reassess your condition in three months. Please continue to take your medications as prescribed and report any changes in your symptoms. If you experience any severe symptoms or worsening of your condition, please seek immediate medical attention.

## 2023-03-10 NOTE — Progress Notes (Signed)
[facility] at [facility] HEMATOLOGY NEW VISIT  [name], [place]  REASON FOR REFERRAL: Pulmonary embolism  SUMMARY OF HEMATOLOGIC HISTORY: -First episode: DVT in 1990s Risk factors: Postpartum? Treatment: Baby aspirin  -Second episode: PE in 06/08/2022 Risk factors: Recent hospitalization with UTI followed by nursing home stay, immobility/limited mobility Treatment: Eliquis 5 mg twice daily   HISTORY OF PRESENT ILLNESS: Traci Clark 89 y.o. female referred for venous thromboembolism and determine the duration of anticoagulation.  She is accompanied by her granddaughter today.  She has a past medical history of colon cancer s/p hemicolectomy in 2003, gout, diabetes, hypertension, CKD, AV block.  The patient reports feeling "tired" and "dizzy" but denies shortness of breath or palpitations. The patient also reports having black stools, but denies any signs of bleeding.  The patient is not very active, spending most of the time sitting in a lift chair or lying in bed due to leg pain and weakness. The patient does not cook anymore due to inability to stand for long periods. The patient had traveled to [place] in June, about six months before the diagnosis of the pulmonary embolism, but did not travel between June and January.  The patient was in a nursing home for about a month from the end of November to right before Christmas due to a severe urinary tract infection and difficulty walking. The patient started experiencing shortness of breath after being discharged from the nursing home and was diagnosed with a pulmonary embolism in January.  The patient had a pacemaker placed in April and has been getting frequent urinary tract infections.  She is a lifetime non-smoker, nonalcoholic.  Lives with grandson in [place].  I have reviewed the past medical history, past surgical history, social history and family history with the patient   ALLERGIES:  is  allergic to amoxil [amoxicillin], macrobid [nitrofurantoin monohyd macro], sulfa antibiotics, pravachol [pravastatin], codeine, darvon [propoxyphene], mirtazapine, neurontin [gabapentin], phenergan [promethazine], rocephin [ceftriaxone], and hydrocodone-acetaminophen.  MEDICATIONS:  Current Outpatient Medications  Medication Sig Dispense Refill   acetaminophen (TYLENOL) 500 MG tablet Take 1,000 mg by mouth every 6 (six) hours as needed (pain.). GELCAPS     albuterol (VENTOLIN HFA) 108 (90 Base) MCG/ACT inhaler Inhale 1-2 puffs into the lungs every 6 (six) hours as needed for shortness of breath or wheezing.     allopurinol (ZYLOPRIM) 100 MG tablet Take 100 mg by mouth in the morning.     ALPRAZolam (XANAX) 0.5 MG tablet Take 0.5 mg by mouth 3 (three) times daily as needed for anxiety.     apixaban (ELIQUIS) 2.5 MG TABS tablet Take 1 tablet (2.5 mg total) by mouth 2 (two) times daily. 60 tablet 3   clobetasol ointment (TEMOVATE) 0.05 % Apply 1 Application topically 2 (two) times daily as needed (irritation.).     dexlansoprazole (DEXILANT) 60 MG capsule Take 60 mg by mouth daily before breakfast.     ferrous sulfate 325 (65 FE) MG tablet Take 325 mg by mouth daily.     fluticasone (FLONASE) 50 MCG/ACT nasal spray Place 2 sprays into both nostrils daily as needed (allergies (cough/sneezing)).     furosemide (LASIX) 20 MG tablet Take 20 mg by mouth in the morning.     insulin aspart (NOVOLOG FLEXPEN RELION) 100 UNIT/ML FlexPen Inject 4-10 Units into the skin 3 (three) times daily with meals. Per sliding scale     Insulin Glargine (BASAGLAR KWIKPEN [place]) Inject 14 Units into the skin at bedtime.  latanoprost (XALATAN) 0.005 % ophthalmic solution Place 1 drop into both eyes at bedtime.     LUTEIN PO Take 40 mg by mouth daily.     meclizine (ANTIVERT) 25 MG tablet Take 1 tablet (25 mg total) by mouth 3 (three) times daily as needed for dizziness. 30 tablet 0   Menthol, Topical Analgesic, (ICY HOT BACK  EXTRA STRENGTH) 5 % PTCH Place 1 patch onto the skin daily as needed (pain.).     polyethylene glycol (MIRALAX / GLYCOLAX) packet Take 34 g by mouth in the morning.     potassium chloride (MICRO-K) 10 MEQ CR capsule Take 10 mEq by mouth in the morning.     traMADol (ULTRAM) 50 MG tablet Take 1 tablet (50 mg total) by mouth every 6 (six) hours as needed for severe pain. 10 tablet 0   triamcinolone ointment (KENALOG) 0.1 % Apply 1 Application topically daily.     No current facility-administered medications for this visit.     REVIEW OF SYSTEMS:   Constitutional: Denies fevers, chills or night sweats Eyes: Denies blurriness of vision Ears, nose, mouth, throat, and face: Denies mucositis or sore throat Respiratory: Denies cough, dyspnea or wheezes Cardiovascular: Denies palpitation, chest discomfort or lower extremity swelling Gastrointestinal:  Denies nausea, heartburn or change in bowel habits Skin: Denies abnormal skin rashes Lymphatics: Denies new lymphadenopathy or easy bruising Neurological:Denies numbness, tingling or new weaknesses Behavioral/Psych: Mood is stable, no new changes  All other systems were reviewed with the patient and are negative.  PHYSICAL EXAMINATION:   Vitals:   03/10/23 1440  BP: 117/82  Pulse: 83  Resp: 18  Temp: 98.6 F (37 C)  SpO2: 100%    GENERAL:alert, no distress and comfortable, in a wheelchair LUNGS: clear to auscultation and percussion with normal breathing effort HEART: regular rate & rhythm and no murmurs and no lower extremity edema ABDOMEN:abdomen soft, non-tender and normal bowel sounds Musculoskeletal:no cyanosis of digits and no clubbing  NEURO: alert & oriented x 3 with fluent speech  LABORATORY DATA:  I have reviewed the data as listed  Lab Results  Component Value Date   WBC 7.1 08/23/2022   NEUTROABS 6.3 05/29/2022   HGB 13.7 08/23/2022   HCT 40.9 08/23/2022   MCV 87.4 08/23/2022   PLT 227 08/23/2022      Component  Value Date/Time   NA 133 (L) 08/23/2022 1258   K 4.2 08/23/2022 1258   CL 97 (L) 08/23/2022 1258   CO2 27 08/23/2022 1258   GLUCOSE 131 (H) 08/23/2022 1258   BUN 15 08/23/2022 1258   CREATININE 1.30 (H) 12/31/2022 1714   CALCIUM 9.3 08/23/2022 1258   PROT 6.3 (L) 05/30/2022 0247   ALBUMIN 2.7 (L) 05/30/2022 0247   AST 26 05/30/2022 0247   ALT 16 05/30/2022 0247   ALKPHOS 97 05/30/2022 0247   BILITOT 0.6 05/30/2022 0247   GFRNONAA 41 (L) 08/23/2022 1258   GFRAA 51 (L) 09/30/2017 0517       Chemistry      Component Value Date/Time   NA 133 (L) 08/23/2022 1258   K 4.2 08/23/2022 1258   CL 97 (L) 08/23/2022 1258   CO2 27 08/23/2022 1258   BUN 15 08/23/2022 1258   CREATININE 1.30 (H) 12/31/2022 1714      Component Value Date/Time   CALCIUM 9.3 08/23/2022 1258   ALKPHOS 97 05/30/2022 0247   AST 26 05/30/2022 0247   ALT 16 05/30/2022 0247   BILITOT 0.6 05/30/2022 0247  ASSESSMENT & PLAN:  Patient is a 89 year old female with multiple comorbidities referred for evaluation of anticoagulant duration for her pulmonary embolism  Pulmonary embolism (HCC) History of PE in January 2024. Limited mobility and history of recent travel and nursing home stay may have contributed to clot formation. Currently on Eliquis 5mg  with concerns of bleeding risk due to advanced age and risk of falls. -Patient likely has provoked clot but has some increases her risk of recurrence.  Patient does not have a very high risk of bleeding at this time. -Since she completed more than 6 months of full dose anticoagulation will consider reducing Eliquis to 2.5mg  to balance clot prevention and bleeding risk. -Plan to reassess in three months.  Colon cancer History of colon cancer in 2003 with surgical resection. No recent follow-up with oncology. -No further oncology follow-up recommended due to age.   No orders of the defined types were placed in this encounter.   The total time spent in the  appointment was 30 minutes encounter with patients including review of chart and various tests results, discussions about plan of care and coordination of care plan   All questions were answered. The patient knows to call the clinic with any problems, questions or concerns. No barriers to learning was detected.   Cindie Crumbly, MD 10/21/20243:48 PM

## 2023-03-10 NOTE — Assessment & Plan Note (Signed)
History of PE in January 2024. Limited mobility and history of recent travel and nursing home stay may have contributed to clot formation. Currently on Eliquis 5mg  with concerns of bleeding risk due to advanced age and risk of falls. -Patient likely has provoked clot but has some increases her risk of recurrence.  Patient does not have a very high risk of bleeding at this time. -Since she completed more than 6 months of full dose anticoagulation will consider reducing Eliquis to 2.5mg  to balance clot prevention and bleeding risk. -Plan to reassess in three months.

## 2023-03-10 NOTE — Assessment & Plan Note (Signed)
History of colon cancer in 2003 with surgical resection. No recent follow-up with oncology. -No further oncology follow-up recommended due to age.

## 2023-03-17 NOTE — Progress Notes (Signed)
Remote pacemaker transmission.   

## 2023-05-28 ENCOUNTER — Ambulatory Visit (INDEPENDENT_AMBULATORY_CARE_PROVIDER_SITE_OTHER): Payer: Medicare HMO

## 2023-05-28 DIAGNOSIS — I4430 Unspecified atrioventricular block: Secondary | ICD-10-CM | POA: Diagnosis not present

## 2023-05-28 LAB — CUP PACEART REMOTE DEVICE CHECK
Battery Remaining Longevity: 103 mo
Battery Remaining Percentage: 94 %
Battery Voltage: 3.01 V
Brady Statistic AP VP Percent: 22 %
Brady Statistic AP VS Percent: 2.1 %
Brady Statistic AS VP Percent: 16 %
Brady Statistic AS VS Percent: 60 %
Brady Statistic RA Percent Paced: 23 %
Brady Statistic RV Percent Paced: 38 %
Date Time Interrogation Session: 20250108040012
Implantable Lead Connection Status: 753985
Implantable Lead Connection Status: 753985
Implantable Lead Implant Date: 20240409
Implantable Lead Implant Date: 20240409
Implantable Lead Location: 753859
Implantable Lead Location: 753860
Implantable Pulse Generator Implant Date: 20240409
Lead Channel Impedance Value: 360 Ohm
Lead Channel Impedance Value: 400 Ohm
Lead Channel Pacing Threshold Amplitude: 0.75 V
Lead Channel Pacing Threshold Amplitude: 0.75 V
Lead Channel Pacing Threshold Pulse Width: 0.5 ms
Lead Channel Pacing Threshold Pulse Width: 0.5 ms
Lead Channel Sensing Intrinsic Amplitude: 3.1 mV
Lead Channel Sensing Intrinsic Amplitude: 5.9 mV
Lead Channel Setting Pacing Amplitude: 2 V
Lead Channel Setting Pacing Amplitude: 2.5 V
Lead Channel Setting Pacing Pulse Width: 0.5 ms
Lead Channel Setting Sensing Sensitivity: 2 mV
Pulse Gen Model: 2272
Pulse Gen Serial Number: 8169291

## 2023-06-10 ENCOUNTER — Inpatient Hospital Stay: Payer: Medicare Other | Admitting: Oncology

## 2023-06-19 ENCOUNTER — Encounter: Payer: Self-pay | Admitting: Nephrology

## 2023-06-23 ENCOUNTER — Inpatient Hospital Stay: Payer: Medicare Other | Attending: Oncology | Admitting: Oncology

## 2023-06-23 VITALS — BP 129/69 | HR 73 | Temp 98.3°F | Resp 18 | Ht 66.0 in | Wt 163.2 lb

## 2023-06-23 DIAGNOSIS — I2699 Other pulmonary embolism without acute cor pulmonale: Secondary | ICD-10-CM

## 2023-06-23 DIAGNOSIS — Z800 Family history of malignant neoplasm of digestive organs: Secondary | ICD-10-CM | POA: Diagnosis not present

## 2023-06-23 DIAGNOSIS — Z86711 Personal history of pulmonary embolism: Secondary | ICD-10-CM | POA: Insufficient documentation

## 2023-06-23 DIAGNOSIS — Z87440 Personal history of urinary (tract) infections: Secondary | ICD-10-CM | POA: Insufficient documentation

## 2023-06-23 DIAGNOSIS — Z79621 Long term (current) use of calcineurin inhibitor: Secondary | ICD-10-CM | POA: Diagnosis not present

## 2023-06-23 DIAGNOSIS — C189 Malignant neoplasm of colon, unspecified: Secondary | ICD-10-CM

## 2023-06-23 DIAGNOSIS — Z79899 Other long term (current) drug therapy: Secondary | ICD-10-CM | POA: Insufficient documentation

## 2023-06-23 DIAGNOSIS — R262 Difficulty in walking, not elsewhere classified: Secondary | ICD-10-CM | POA: Diagnosis not present

## 2023-06-23 DIAGNOSIS — Z7901 Long term (current) use of anticoagulants: Secondary | ICD-10-CM | POA: Diagnosis not present

## 2023-06-23 NOTE — Progress Notes (Addendum)
[facility] at [facility] HEMATOLOGY FOLLOW-UP VISIT  Traci Clark, [place]  REASON FOR FOLLOW-UP: Pulmonary embolism  ASSESSMENT & PLAN:  Patient is a 89 year old female with multiple comorbidities referred for evaluation of anticoagulant duration for her pulmonary embolism   Pulmonary embolism (HCC) Patient has been on Eliquis for over a year following a PE during a hospitalization. She is now more mobile than during her hospitalization and has a recent history of falls, including one that resulted in a head injury. Discussed the risks of continuing anticoagulation (increased risk of bleeding, especially with falls) versus stopping (increased risk of clot formation).  With her age, comorbidities and risks of fall, it is risk of continuing anticoagulation outweigh the benefits. -Discontinue Eliquis due to increased risk of bleeding with falls. -Continue to follow with primary care  Colon cancer History of colon cancer in 2003 with surgical resection. No recent follow-up with oncology. -No further oncology follow-up recommended due to age.    The total time spent in the appointment was 20 minutes encounter with patients including review of chart and various tests results, discussions about plan of care and coordination of care plan   All questions were answered. The patient knows to call the clinic with any problems, questions or concerns. No barriers to learning was detected.  Traci Crumbly, MD 2/3/20251:21 PM   SUMMARY OF HEMATOLOGIC HISTORY: -First episode: DVT in 1990s Risk factors: Postpartum? Treatment: Baby aspirin   -Second episode: PE in 06/08/2022 Risk factors: Recent hospitalization with UTI followed by nursing home stay, immobility/limited mobility Treatment: Eliquis 5 mg twice daily   INTERVAL HISTORY: Traci Clark 89 y.o. female is following for determination of anticoagulation duration for her provoked PE.  She is accompanied by  her granddaughter today.She reports difficulty walking, but she is making efforts to move around the house. She has fallen once since the last visit. She also reports a recent hospital visit due to head pain after a fall, but no significant findings were reported from the hospital visit. The patient has been on Eliquis, a blood thinner, for over a year due to a previous blood clot. She has been taking prophylactic dose for the past three months.  I have reviewed the past medical history, past surgical history, social history and family history with the patient   ALLERGIES:  is allergic to amoxil [amoxicillin], macrobid [nitrofurantoin monohyd macro], sulfa antibiotics, pravachol [pravastatin], codeine, darvon [propoxyphene], mirtazapine, neurontin [gabapentin], phenergan [promethazine], rocephin [ceftriaxone], and hydrocodone-acetaminophen.  MEDICATIONS:  Current Outpatient Medications  Medication Sig Dispense Refill   acetaminophen (TYLENOL) 500 MG tablet Take 1,000 mg by mouth every 6 (six) hours as needed (pain.). GELCAPS     albuterol (VENTOLIN HFA) 108 (90 Base) MCG/ACT inhaler Inhale 1-2 puffs into the lungs every 6 (six) hours as needed for shortness of breath or wheezing.     allopurinol (ZYLOPRIM) 100 MG tablet Take 100 mg by mouth in the morning.     ALPRAZolam (XANAX) 0.5 MG tablet Take 0.5 mg by mouth 3 (three) times daily as needed for anxiety.     clobetasol ointment (TEMOVATE) 0.05 % Apply 1 Application topically 2 (two) times daily as needed (irritation.).     dexlansoprazole (DEXILANT) 60 MG capsule Take 60 mg by mouth daily before breakfast.     ferrous sulfate 325 (65 FE) MG tablet Take 325 mg by mouth daily.     fluticasone (FLONASE) 50 MCG/ACT nasal spray Place 2 sprays into both nostrils daily  as needed (allergies (cough/sneezing)).     furosemide (LASIX) 20 MG tablet Take 20 mg by mouth in the morning.     insulin aspart (NOVOLOG FLEXPEN RELION) 100 UNIT/ML FlexPen Inject  4-10 Units into the skin 3 (three) times daily with meals. Per sliding scale     Insulin Glargine (BASAGLAR KWIKPEN [place]) Inject 14 Units into the skin at bedtime.     latanoprost (XALATAN) 0.005 % ophthalmic solution Place 1 drop into both eyes at bedtime.     LUTEIN PO Take 40 mg by mouth daily.     meclizine (ANTIVERT) 25 MG tablet Take 1 tablet (25 mg total) by mouth 3 (three) times daily as needed for dizziness. 30 tablet 0   Menthol, Topical Analgesic, (ICY HOT BACK EXTRA STRENGTH) 5 % PTCH Place 1 patch onto the skin daily as needed (pain.).     polyethylene glycol (MIRALAX / GLYCOLAX) packet Take 34 g by mouth in the morning.     potassium chloride (MICRO-K) 10 MEQ CR capsule Take 10 mEq by mouth in the morning.     tacrolimus (PROTOPIC) 0.1 % ointment Apply 1 Application topically 2 (two) times daily.     traMADol (ULTRAM) 50 MG tablet Take 1 tablet (50 mg total) by mouth every 6 (six) hours as needed for severe pain. 10 tablet 0   triamcinolone ointment (KENALOG) 0.1 % Apply 1 Application topically daily.     No current facility-administered medications for this visit.     REVIEW OF SYSTEMS:   Constitutional: Denies fevers, chills or night sweats Eyes: Denies blurriness of vision Ears, nose, mouth, throat, and face: Denies mucositis or sore throat Respiratory: Denies cough, dyspnea or wheezes Cardiovascular: Denies palpitation, chest discomfort or lower extremity swelling Gastrointestinal:  Denies nausea, heartburn or change in bowel habits Skin: Denies abnormal skin rashes Lymphatics: Denies new lymphadenopathy or easy bruising Neurological:Denies numbness, tingling or new weaknesses Behavioral/Psych: Mood is stable, no new changes  All other systems were reviewed with the patient and are negative.  PHYSICAL EXAMINATION:   Vitals:   06/23/23 1253  BP: 129/69  Pulse: 73  Resp: 18  Temp: 98.3 F (36.8 C)  SpO2: 96%    GENERAL:alert, no distress and comfortable, in a  wheelchair SKIN: skin color, texture, turgor are normal, no rashes or significant lesions EYES: normal, Conjunctiva are pink and non-injected, sclera clear LUNGS: clear to auscultation and percussion with normal breathing effort HEART: regular rate & rhythm and no murmurs and no lower extremity edema ABDOMEN:abdomen soft, non-tender and normal bowel sounds Musculoskeletal:no cyanosis of digits and no clubbing  NEURO: alert & oriented x 3 with fluent speech  LABORATORY DATA:  I have reviewed the data as listed  Lab Results  Component Value Date   WBC 7.1 08/23/2022   NEUTROABS 6.3 05/29/2022   HGB 13.7 08/23/2022   HCT 40.9 08/23/2022   MCV 87.4 08/23/2022   PLT 227 08/23/2022       Chemistry      Component Value Date/Time   NA 133 (L) 08/23/2022 1258   K 4.2 08/23/2022 1258   CL 97 (L) 08/23/2022 1258   CO2 27 08/23/2022 1258   BUN 15 08/23/2022 1258   CREATININE 1.30 (H) 12/31/2022 1714      Component Value Date/Time   CALCIUM 9.3 08/23/2022 1258   ALKPHOS 97 05/30/2022 0247   AST 26 05/30/2022 0247   ALT 16 05/30/2022 0247   BILITOT 0.6 05/30/2022 0247

## 2023-06-23 NOTE — Assessment & Plan Note (Signed)
History of colon cancer in 2003 with surgical resection. No recent follow-up with oncology. -No further oncology follow-up recommended due to age.

## 2023-06-23 NOTE — Assessment & Plan Note (Signed)
Patient has been on Eliquis for over a year following a PE during a hospitalization. She is now more mobile than during her hospitalization and has a recent history of falls, including one that resulted in a head injury. Discussed the risks of continuing anticoagulation (increased risk of bleeding, especially with falls) versus stopping (increased risk of clot formation).  With her age, comorbidities and risks of fall, it is risk of continuing anticoagulation outweigh the benefits. -Discontinue Eliquis due to increased risk of bleeding with falls. -Continue to follow with primary care

## 2023-06-23 NOTE — Patient Instructions (Signed)
VISIT SUMMARY:  During today's visit, we discussed your persistent leg weakness and pain, as well as your recent fall and hospital visit. We reviewed your current medications and mobility issues.  YOUR PLAN:  -HISTORY OF DEEP VEIN THROMBOSIS (DVT): Deep Vein Thrombosis (DVT) is a condition where a blood clot forms in a deep vein, usually in the legs. You have been on Eliquis, a blood thinner, for over a year to prevent further clots. However, due to your recent falls and the increased risk of bleeding, we have decided to discontinue Eliquis.   -MOBILITY ISSUES: You have been experiencing difficulty walking due to leg pain. It is important to continue moving around the house and performing leg exercises as much as you can tolerate to improve your mobility.  INSTRUCTIONS:  No further follow-up with this specialist is needed. Please continue your care with your primary care provider.

## 2023-06-26 ENCOUNTER — Other Ambulatory Visit: Payer: Self-pay | Admitting: *Deleted

## 2023-06-26 ENCOUNTER — Other Ambulatory Visit: Payer: Self-pay | Admitting: Oncology

## 2023-07-09 NOTE — Progress Notes (Signed)
 Remote pacemaker transmission.

## 2023-08-26 ENCOUNTER — Ambulatory Visit (INDEPENDENT_AMBULATORY_CARE_PROVIDER_SITE_OTHER): Payer: Medicare Other | Admitting: Gastroenterology

## 2023-08-26 ENCOUNTER — Telehealth (INDEPENDENT_AMBULATORY_CARE_PROVIDER_SITE_OTHER): Payer: Self-pay | Admitting: Gastroenterology

## 2023-08-26 ENCOUNTER — Encounter (INDEPENDENT_AMBULATORY_CARE_PROVIDER_SITE_OTHER): Payer: Self-pay | Admitting: Gastroenterology

## 2023-08-26 VITALS — BP 126/80 | HR 82 | Temp 97.8°F | Ht 66.0 in | Wt 159.5 lb

## 2023-08-26 DIAGNOSIS — R195 Other fecal abnormalities: Secondary | ICD-10-CM | POA: Diagnosis not present

## 2023-08-26 DIAGNOSIS — R1031 Right lower quadrant pain: Secondary | ICD-10-CM

## 2023-08-26 DIAGNOSIS — R10817 Generalized abdominal tenderness: Secondary | ICD-10-CM

## 2023-08-26 DIAGNOSIS — K5900 Constipation, unspecified: Secondary | ICD-10-CM | POA: Diagnosis not present

## 2023-08-26 DIAGNOSIS — R112 Nausea with vomiting, unspecified: Secondary | ICD-10-CM

## 2023-08-26 NOTE — Telephone Encounter (Signed)
 CT scan approved via insurance. Auth number [id number] Valid from 08/26/23-02/22/24. Pt scheduled for 08/27/23 at 3:00pm; pt needs to arrive at 1 pm to begin drinking oral contrast. Contacted number on file but voicemail is full and unable to accept message. Contacted Minerva Areola grandson and gave him appt info.

## 2023-08-26 NOTE — Patient Instructions (Signed)
 For now, please increase miralax to 1 capful (17g) three times per day We will work on getting a CT scan of your abdomen to further evaluate  Follow up 2 months

## 2023-08-26 NOTE — Progress Notes (Addendum)
 Referring Provider: Jolynn Needy, PA Primary Care Physician:  Jolynn Needy, PA Primary GI Physician: Dr. Sammi Crick   Chief Complaint  Patient presents with   Abdominal Pain    Patient here today having issues with stomach cramping and Constipation. She has seen some darker stools. Denies any sight of bright red blood in stools.   HPI:   Traci Clark is a 89 y.o. female with past medical history of CKD, history of colon cancer status post resection, diabetes, IBS, GERD, seizures, glaucoma, anxiety, DVT, gout   Patient presenting today for:  Follow up of abdominal pain, constipation and dark stools  Last seen October 2024, at that time having dark stools.  Notes this was occurring prior to her last EGD colonoscopy.  Stools are looser but she is taking 2 MiraLAX per day.  No bright red blood per rectum.  Appetite is good and weight is stable.  Previously had negative stool cards x 3  Recommended to continue MiraLAX 2 capfuls daily, obtain labs for review, consider Givens capsule endoscopy if labs indicate IDA  Labs in December with hgb 12.4, iron 63, TIBC 235, sat 24, ferritin 196  Present: Patient states she fell off the bed a few weeks ago and hurt her knee. She denies falling onto her abdomen. She notes after that her appetite went down and she is having more constipation now. She states she feels she has to go to the restroom but is passing very little stools. She is doing miralax which was previously working well for her but now is not having much result. She notes some nausea and vomiting as well that began a few days ago. She endorses some darker stools intermittently but no BRB in stools. She did take a different type of laxative yesterday but has not had any BMs. She is taking tramadol PRN. She endorses some mid abdominal pain. Has not found anything so far that has made her pain better. She is unsure when last good BM was.    CT A/P with contrast August 2024 with  cardiomegaly, bilareal renal cysts, fatty atrophy of pancreas, no lesions or inflammatory changes Last Colonoscopy:07/21/20 presence of a patent anastomosis in the ascending colon with normal mucosa, 3 polyps between 2 to 5 mm were removed from the ascending colon and descending colon. Last Endoscopy: 03/2022 - White nummular lesions in esophageal mucosa. Biopsied. - A few gastric polyps. Biopsied normal mucosa for H. pylori testing. - Normal examined duodenum.   A.   STOMACH, BIOPSY:  -    Reactive gastropathy.  -    Negative for an inflammatory pattern predictive of Helicobacter  pylori infection.  -    Negative for intestinal metaplasia and malignancy.   B.  ESOPHAGUS, BIOPSY:  -    Severe acute Candida esophagitis.  -    No viral cytopathic change identified.  -    No malignancy identified.  -    No glandular mucosa present.    Patient was prescribed a 3 week course of antifungal treatment.  Past Medical History:  Diagnosis Date   Anxiety    Arthritis    Chronic constipation    CKD (chronic kidney disease), stage III (HCC)    Colon cancer (HCC)    colon   Complication of anesthesia    Diabetes mellitus without complication (HCC)    DVT (deep vein thrombosis) in pregnancy    Dysphagia    GERD (gastroesophageal reflux disease)    Glaucoma  Gout    H/O hiatal hernia    History of kidney stones    Iliac artery aneurysm, bilateral (HCC)    MI (myocardial infarction) (HCC) 1993   PONV (postoperative nausea and vomiting)    Restless leg syndrome    Seizure (HCC)    withdrawal from xanax   Vertigo     Past Surgical History:  Procedure Laterality Date   BIOPSY  07/21/2020   Procedure: BIOPSY;  Surgeon: Urban Garden, MD;  [facility]: [facility];  Service: Gastroenterology;;   BIOPSY  03/26/2022   Procedure: BIOPSY;  Surgeon: Urban Garden, MD;  [facility]: [facility];  Service: Gastroenterology;;   CHOLECYSTECTOMY     COLECTOMY  2003    COLONOSCOPY N/A 07/29/2012   Procedure: COLONOSCOPY;  Surgeon: Ruby Corporal, MD;  [facility]: [facility];  Service: Endoscopy;  Laterality: N/A;  100   COLONOSCOPY WITH PROPOFOL N/A 12/13/2016   Procedure: COLONOSCOPY WITH PROPOFOL;  Surgeon: Ruby Corporal, MD;  [facility]: [facility];  Service: Endoscopy;  Laterality: N/A;  8:30   COLONOSCOPY WITH PROPOFOL N/A 07/21/2020   Procedure: COLONOSCOPY WITH PROPOFOL;  Surgeon: Urban Garden, MD;  [facility]: [facility];  Service: Gastroenterology;  Laterality: N/A;  AM   ESOPHAGEAL DILATION  11/03/2015   Procedure: ESOPHAGEAL DILATION;  Surgeon: Ruby Corporal, MD;  [facility]: [facility];  Service: Endoscopy;;   ESOPHAGEAL DILATION N/A 09/26/2017   Procedure: ESOPHAGEAL DILATION;  Surgeon: Ruby Corporal, MD;  [facility]: [facility];  Service: Endoscopy;  Laterality: N/A;   ESOPHAGOGASTRODUODENOSCOPY N/A 03/25/2014   Procedure: ESOPHAGOGASTRODUODENOSCOPY (EGD);  Surgeon: Ruby Corporal, MD;  [facility]: [facility];  Service: Endoscopy;  Laterality: N/A;  1055   ESOPHAGOGASTRODUODENOSCOPY N/A 11/03/2015   Procedure: ESOPHAGOGASTRODUODENOSCOPY (EGD);  Surgeon: Ruby Corporal, MD;  [facility]: [facility];  Service: Endoscopy;  Laterality: N/A;  1:55 - moved to 11:15 - Ann to notify - moved to 12:45 - pt knows to arrive at 11:45   ESOPHAGOGASTRODUODENOSCOPY N/A 09/26/2017   Procedure: ESOPHAGOGASTRODUODENOSCOPY (EGD);  Surgeon: Ruby Corporal, MD;  [facility]: [facility];  Service: Endoscopy;  Laterality: N/A;   ESOPHAGOGASTRODUODENOSCOPY (EGD) WITH PROPOFOL N/A 07/21/2020   Procedure: ESOPHAGOGASTRODUODENOSCOPY (EGD) WITH PROPOFOL;  Surgeon: Urban Garden, MD;  [facility];  Service: Gastroenterology;  Laterality: N/A;   ESOPHAGOGASTRODUODENOSCOPY (EGD) WITH PROPOFOL N/A 03/26/2022   Procedure: ESOPHAGOGASTRODUODENOSCOPY (EGD) WITH PROPOFOL;  Surgeon: Urban Garden, MD;  [facility]: [facility];   Service: Gastroenterology;  Laterality: N/A;  1215 ASA 3   MALONEY DILATION N/A 03/25/2014   Procedure: MALONEY DILATION;  Surgeon: Ruby Corporal, MD;  [facility]: [facility];  Service: Endoscopy;  Laterality: N/A;   PACEMAKER IMPLANT N/A 08/27/2022   Procedure: PACEMAKER IMPLANT;  Surgeon: Efraim Grange, MD;  [facility]: [facility];  Service: Cardiovascular;  Laterality: N/A;   POLYPECTOMY  12/13/2016   Procedure: POLYPECTOMY;  Surgeon: Ruby Corporal, MD;  [facility]: [facility];  Service: Endoscopy;;  polyp   POLYPECTOMY  07/21/2020   Procedure: POLYPECTOMY;  Surgeon: Umberto Ganong, Bearl Limes, MD;  Location: [facility];  Service: Gastroenterology;;   TOTAL HIP ARTHROPLASTY Left    TUBAL LIGATION     VEIN SURGERY Bilateral    stripped vein due to DVT    Current Outpatient Medications  Medication Sig Dispense Refill   acetaminophen (TYLENOL) 500 MG tablet Take 1,000 mg by mouth every 6 (six)  hours as needed (pain.). GELCAPS     albuterol (VENTOLIN HFA) 108 (90 Base) MCG/ACT inhaler Inhale 1-2 puffs into the lungs every 6 (six) hours as needed for shortness of breath or wheezing.     allopurinol (ZYLOPRIM) 100 MG tablet Take 100 mg by mouth in the morning.     ALPRAZolam (XANAX) 0.5 MG tablet Take 0.5 mg by mouth 3 (three) times daily as needed for anxiety.     clobetasol ointment (TEMOVATE) 0.05 % Apply 1 Application topically 2 (two) times daily as needed (irritation.).     dexlansoprazole (DEXILANT) 60 MG capsule Take 60 mg by mouth daily before breakfast.     fluticasone (FLONASE) 50 MCG/ACT nasal spray Place 2 sprays into both nostrils daily as needed (allergies (cough/sneezing)).     furosemide (LASIX) 20 MG tablet Take 20 mg by mouth in the morning.     insulin aspart (NOVOLOG FLEXPEN RELION) 100 UNIT/ML FlexPen Inject 4-10 Units into the skin 3 (three) times daily with meals. Per sliding scale     Insulin Glargine (BASAGLAR KWIKPEN [place]) Inject 20 Units into the skin at  bedtime.     latanoprost (XALATAN) 0.005 % ophthalmic solution Place 1 drop into both eyes at bedtime.     LUTEIN PO Take 40 mg by mouth daily.     meclizine (ANTIVERT) 25 MG tablet Take 1 tablet (25 mg total) by mouth 3 (three) times daily as needed for dizziness. 30 tablet 0   Menthol, Topical Analgesic, (ICY HOT BACK EXTRA STRENGTH) 5 % PTCH Place 1 patch onto the skin daily as needed (pain.).     polyethylene glycol (MIRALAX / GLYCOLAX) packet Take 34 g by mouth in the morning.     potassium chloride (MICRO-K) 10 MEQ CR capsule Take 10 mEq by mouth in the morning.     traMADol (ULTRAM) 50 MG tablet Take 1 tablet (50 mg total) by mouth every 6 (six) hours as needed for severe pain. 10 tablet 0   triamcinolone ointment (KENALOG) 0.1 % Apply 1 Application topically daily.     tacrolimus (PROTOPIC) 0.1 % ointment Apply 1 Application topically 2 (two) times daily. (Patient not taking: Reported on 08/26/2023)     No current facility-administered medications for this visit.    Allergies as of 08/26/2023 - Review Complete 08/26/2023  Allergen Reaction Noted   Amoxil [amoxicillin] Anaphylaxis and Rash 10/31/2015   Macrobid [nitrofurantoin monohyd macro] Anaphylaxis, Rash, and Other (See Comments) 10/31/2015   Sulfa antibiotics Other (See Comments) 05/29/2022   Pravachol [pravastatin] Other (See Comments) 09/22/2015   Codeine Nausea And Vomiting and Other (See Comments) 07/14/2012   Darvon [propoxyphene] Nausea And Vomiting and Other (See Comments) 06/27/2014   Mirtazapine Other (See Comments) 03/15/2022   Neurontin [gabapentin] Nausea Only 12/21/2020   Phenergan [promethazine] Other (See Comments) 02/28/2018   Rocephin [ceftriaxone] Other (See Comments) 11/13/2020   Hydrocodone-acetaminophen Nausea And Vomiting and Other (See Comments) 02/04/2012    Social History   Socioeconomic History   Marital status: Married    Spouse name: Not on file   Number of children: Not on file   Years of  education: Not on file   Highest education level: Not on file  Occupational History   Not on file  Tobacco Use   Smoking status: Never    Passive exposure: Never   Smokeless tobacco: Never  Vaping Use   Vaping status: Never Used  Substance and Sexual Activity   Alcohol use: No    Alcohol/week:  0.0 standard drinks of alcohol   Drug use: No   Sexual activity: Not Currently    Birth control/protection: Surgical  Other Topics Concern   Not on file  Social History Narrative   Not on file   Social Drivers of Health   Financial Resource Strain: Not on file  Food Insecurity: No Food Insecurity (05/31/2022)   Hunger Vital Sign    Worried About Running Out of Food in the Last Year: Never true    Ran Out of Food in the Last Year: Never true  Transportation Needs: No Transportation Needs (05/31/2022)   PRAPARE - Administrator, Civil Service (Medical): No    Lack of Transportation (Non-Medical): No  Physical Activity: Not on file  Stress: Not on file  Social Connections: Not on file    Review of systems General: negative for malaise, night sweats, fever, chills, weight loss Neck: Negative for lumps, goiter, pain and significant neck swelling Resp: Negative for cough, wheezing, dyspnea at rest CV: Negative for chest pain, leg swelling, palpitations, orthopnea GI: denies melena, hematochezia, nausea, vomiting, diarrhea, dysphagia, odyonophagia, early satiety or unintentional weight loss. +dark stools +constipation +abdominal pain  The remainder of the review of systems is noncontributory.  Physical Exam: BP 126/80 (BP Location: Left Arm, Patient Position: Sitting, Cuff Size: Normal)   Pulse 82   Temp 97.8 F (36.6 C) (Temporal)   Ht 5\' 6"  (1.676 m)   Wt 159 lb 8 oz (72.3 kg)   BMI 25.74 kg/m  General:   Alert and oriented. No distress noted. Pleasant and cooperative.  Head:  Normocephalic and atraumatic. Eyes:  Conjuctiva clear without scleral icterus. Mouth:  Oral  mucosa pink and moist. Good dentition. No lesions. Heart: Normal rate and rhythm, s1 and s2 heart sounds present.  Lungs: Clear lung sounds in all lobes. Respirations equal and unlabored. Abdomen:  +BS, soft, and non-distended. TTP of RLQ.  No rebound or guarding. No HSM or masses noted. Derm: No palmar erythema or jaundice Msk:  Symmetrical without gross deformities. Normal posture. Extremities:  Without edema. Neurologic:  Alert and  oriented x4 Psych:  Alert and cooperative. Normal mood and affect.  Invalid input(s): "6 MONTHS"   ASSESSMENT: Traci Clark is a 89 y.o. female presenting today for constipation, abdominal pain and dark stools  Patient with ongoing abdominal pain and dark stools for the past almost 3 years, which started Friday last endoscopic evaluation was outlined above.  She had negative occult stool cards x 3 in June 2024.  CT at that time was without acute findings.  Hemoglobin is remained stable.  She has no symptoms iron deficiency anemia.  She endorses more abdominal pain and constipation over the past few days, abdominal exam is with diffuse generalized abdominal tenderness.  Suspect worsening abdominal may be in setting of constipation, she is maintained on chronic tramadol which is likely contributing, However, at this time recommend increasing MiraLAX to 1 capful 3 times daily, obtain stat CT abdomen pelvis with contrast for evaluation given her abdominal tenderness on exam today.   PLAN:  -increase miralax to 1 capful TID -STAT CT a/P with contrast  -Increase water intake, aim for atleast 64 oz per day -Increase fruits, veggies and whole grains, kiwi and prunes are especially good for constipation  All questions were answered, patient verbalized understanding and is in agreement with plan as outlined above.   Follow Up: 2 months  Lucas Exline L. Donye Dauenhauer, MSN, APRN, AGNP-C Adult-Gerontology Nurse Practitioner  [facility]  I have  reviewed the note and agree with the APP's assessment as described in this progress note  Samantha Cress, MD Gastroenterology and Hepatology [facility] Gastroenterology

## 2023-08-27 ENCOUNTER — Ambulatory Visit (INDEPENDENT_AMBULATORY_CARE_PROVIDER_SITE_OTHER): Payer: Medicare HMO

## 2023-08-27 ENCOUNTER — Ambulatory Visit (HOSPITAL_COMMUNITY)
Admission: RE | Admit: 2023-08-27 | Discharge: 2023-08-27 | Disposition: A | Discharge status: Home / Self Care | Source: Ambulatory Visit | Attending: Gastroenterology | Admitting: Gastroenterology

## 2023-08-27 DIAGNOSIS — R1031 Right lower quadrant pain: Secondary | ICD-10-CM | POA: Insufficient documentation

## 2023-08-27 DIAGNOSIS — I4430 Unspecified atrioventricular block: Secondary | ICD-10-CM

## 2023-08-27 DIAGNOSIS — R112 Nausea with vomiting, unspecified: Secondary | ICD-10-CM | POA: Insufficient documentation

## 2023-08-27 LAB — POCT I-STAT CREATININE: Creatinine, Ser: 1.2 mg/dL — ABNORMAL HIGH (ref 0.44–1.00)

## 2023-08-27 MED ORDER — IOHEXOL 300 MG/ML  SOLN
100.0000 mL | Freq: Once | INTRAMUSCULAR | Status: AC | PRN
  Administered 2023-08-27: 80 mL via INTRAVENOUS

## 2023-08-27 MED ORDER — IOHEXOL 9 MG/ML PO SOLN
500.0000 mL | ORAL | Status: AC
  Administered 2023-08-27: 500 mL via ORAL

## 2023-08-27 MED ORDER — IOHEXOL 9 MG/ML PO SOLN
ORAL | Status: AC
  Filled 2023-08-27: qty 1000

## 2023-08-28 LAB — CUP PACEART REMOTE DEVICE CHECK
Battery Remaining Longevity: 101 mo
Battery Remaining Percentage: 92 %
Battery Voltage: 3.01 V
Brady Statistic AP VP Percent: 23 %
Brady Statistic AP VS Percent: 1.6 %
Brady Statistic AS VP Percent: 21 %
Brady Statistic AS VS Percent: 54 %
Brady Statistic RA Percent Paced: 24 %
Brady Statistic RV Percent Paced: 44 %
Date Time Interrogation Session: 20250409040014
Implantable Lead Connection Status: 753985
Implantable Lead Connection Status: 753985
Implantable Lead Implant Date: 20240409
Implantable Lead Implant Date: 20240409
Implantable Lead Location: 753859
Implantable Lead Location: 753860
Implantable Pulse Generator Implant Date: 20240409
Lead Channel Impedance Value: 380 Ohm
Lead Channel Impedance Value: 440 Ohm
Lead Channel Pacing Threshold Amplitude: 0.75 V
Lead Channel Pacing Threshold Amplitude: 0.75 V
Lead Channel Pacing Threshold Pulse Width: 0.5 ms
Lead Channel Pacing Threshold Pulse Width: 0.5 ms
Lead Channel Sensing Intrinsic Amplitude: 12 mV
Lead Channel Sensing Intrinsic Amplitude: 4.7 mV
Lead Channel Setting Pacing Amplitude: 2 V
Lead Channel Setting Pacing Amplitude: 2.5 V
Lead Channel Setting Pacing Pulse Width: 0.5 ms
Lead Channel Setting Sensing Sensitivity: 2 mV
Pulse Gen Model: 2272
Pulse Gen Serial Number: 8169291

## 2023-09-02 ENCOUNTER — Encounter: Payer: Self-pay | Admitting: Cardiovascular Disease

## 2023-09-02 DIAGNOSIS — R1031 Right lower quadrant pain: Secondary | ICD-10-CM | POA: Insufficient documentation

## 2023-10-02 ENCOUNTER — Other Ambulatory Visit: Payer: Self-pay

## 2023-10-02 ENCOUNTER — Encounter (HOSPITAL_COMMUNITY): Payer: Self-pay

## 2023-10-02 ENCOUNTER — Emergency Department (HOSPITAL_COMMUNITY)

## 2023-10-02 ENCOUNTER — Inpatient Hospital Stay (HOSPITAL_COMMUNITY)
Admission: EM | Admit: 2023-10-02 | Discharge: 2023-10-04 | DRG: 391 | Disposition: A | Discharge status: Home / Self Care | Attending: Internal Medicine | Admitting: Internal Medicine

## 2023-10-02 ENCOUNTER — Telehealth (INDEPENDENT_AMBULATORY_CARE_PROVIDER_SITE_OTHER): Payer: Self-pay | Admitting: *Deleted

## 2023-10-02 DIAGNOSIS — K222 Esophageal obstruction: Secondary | ICD-10-CM | POA: Diagnosis not present

## 2023-10-02 DIAGNOSIS — Z86718 Personal history of other venous thrombosis and embolism: Secondary | ICD-10-CM

## 2023-10-02 DIAGNOSIS — Q394 Esophageal web: Secondary | ICD-10-CM

## 2023-10-02 DIAGNOSIS — R1110 Vomiting, unspecified: Secondary | ICD-10-CM | POA: Diagnosis present

## 2023-10-02 DIAGNOSIS — Z8249 Family history of ischemic heart disease and other diseases of the circulatory system: Secondary | ICD-10-CM

## 2023-10-02 DIAGNOSIS — R55 Syncope and collapse: Secondary | ICD-10-CM | POA: Diagnosis present

## 2023-10-02 DIAGNOSIS — Z79899 Other long term (current) drug therapy: Secondary | ICD-10-CM

## 2023-10-02 DIAGNOSIS — K317 Polyp of stomach and duodenum: Secondary | ICD-10-CM | POA: Diagnosis present

## 2023-10-02 DIAGNOSIS — Z794 Long term (current) use of insulin: Secondary | ICD-10-CM

## 2023-10-02 DIAGNOSIS — Z96642 Presence of left artificial hip joint: Secondary | ICD-10-CM | POA: Diagnosis present

## 2023-10-02 DIAGNOSIS — Z882 Allergy status to sulfonamides status: Secondary | ICD-10-CM

## 2023-10-02 DIAGNOSIS — I252 Old myocardial infarction: Secondary | ICD-10-CM

## 2023-10-02 DIAGNOSIS — Z85038 Personal history of other malignant neoplasm of large intestine: Secondary | ICD-10-CM

## 2023-10-02 DIAGNOSIS — N1832 Chronic kidney disease, stage 3b: Secondary | ICD-10-CM | POA: Diagnosis present

## 2023-10-02 DIAGNOSIS — D631 Anemia in chronic kidney disease: Secondary | ICD-10-CM | POA: Diagnosis present

## 2023-10-02 DIAGNOSIS — I10 Essential (primary) hypertension: Secondary | ICD-10-CM | POA: Diagnosis present

## 2023-10-02 DIAGNOSIS — Z881 Allergy status to other antibiotic agents status: Secondary | ICD-10-CM

## 2023-10-02 DIAGNOSIS — Z9049 Acquired absence of other specified parts of digestive tract: Secondary | ICD-10-CM

## 2023-10-02 DIAGNOSIS — K219 Gastro-esophageal reflux disease without esophagitis: Secondary | ICD-10-CM | POA: Diagnosis present

## 2023-10-02 DIAGNOSIS — E46 Unspecified protein-calorie malnutrition: Secondary | ICD-10-CM

## 2023-10-02 DIAGNOSIS — Z950 Presence of cardiac pacemaker: Secondary | ICD-10-CM

## 2023-10-02 DIAGNOSIS — F419 Anxiety disorder, unspecified: Secondary | ICD-10-CM | POA: Diagnosis present

## 2023-10-02 DIAGNOSIS — Z885 Allergy status to narcotic agent status: Secondary | ICD-10-CM

## 2023-10-02 DIAGNOSIS — R1310 Dysphagia, unspecified: Secondary | ICD-10-CM | POA: Diagnosis not present

## 2023-10-02 DIAGNOSIS — K449 Diaphragmatic hernia without obstruction or gangrene: Secondary | ICD-10-CM | POA: Diagnosis present

## 2023-10-02 DIAGNOSIS — E8809 Other disorders of plasma-protein metabolism, not elsewhere classified: Secondary | ICD-10-CM | POA: Diagnosis not present

## 2023-10-02 DIAGNOSIS — Z86711 Personal history of pulmonary embolism: Secondary | ICD-10-CM

## 2023-10-02 DIAGNOSIS — I129 Hypertensive chronic kidney disease with stage 1 through stage 4 chronic kidney disease, or unspecified chronic kidney disease: Secondary | ICD-10-CM | POA: Diagnosis present

## 2023-10-02 DIAGNOSIS — G2581 Restless legs syndrome: Secondary | ICD-10-CM | POA: Diagnosis present

## 2023-10-02 DIAGNOSIS — M109 Gout, unspecified: Secondary | ICD-10-CM | POA: Diagnosis present

## 2023-10-02 DIAGNOSIS — H409 Unspecified glaucoma: Secondary | ICD-10-CM | POA: Diagnosis present

## 2023-10-02 DIAGNOSIS — K921 Melena: Secondary | ICD-10-CM | POA: Diagnosis present

## 2023-10-02 DIAGNOSIS — Z888 Allergy status to other drugs, medicaments and biological substances status: Secondary | ICD-10-CM

## 2023-10-02 DIAGNOSIS — Z860100 Personal history of colon polyps, unspecified: Secondary | ICD-10-CM

## 2023-10-02 DIAGNOSIS — Z880 Allergy status to penicillin: Secondary | ICD-10-CM

## 2023-10-02 DIAGNOSIS — E119 Type 2 diabetes mellitus without complications: Secondary | ICD-10-CM

## 2023-10-02 DIAGNOSIS — E1122 Type 2 diabetes mellitus with diabetic chronic kidney disease: Secondary | ICD-10-CM | POA: Diagnosis present

## 2023-10-02 DIAGNOSIS — K209 Esophagitis, unspecified without bleeding: Secondary | ICD-10-CM | POA: Diagnosis present

## 2023-10-02 DIAGNOSIS — I441 Atrioventricular block, second degree: Secondary | ICD-10-CM | POA: Diagnosis present

## 2023-10-02 LAB — CBC WITH DIFFERENTIAL/PLATELET
Abs Immature Granulocytes: 0.02 10*3/uL (ref 0.00–0.07)
Basophils Absolute: 0.1 10*3/uL (ref 0.0–0.1)
Basophils Relative: 1 %
Eosinophils Absolute: 0.1 10*3/uL (ref 0.0–0.5)
Eosinophils Relative: 1 %
HCT: 37.3 % (ref 36.0–46.0)
Hemoglobin: 12.5 g/dL (ref 12.0–15.0)
Immature Granulocytes: 0 %
Lymphocytes Relative: 42 %
Lymphs Abs: 3.5 10*3/uL (ref 0.7–4.0)
MCH: 29.1 pg (ref 26.0–34.0)
MCHC: 33.5 g/dL (ref 30.0–36.0)
MCV: 86.9 fL (ref 80.0–100.0)
Monocytes Absolute: 0.6 10*3/uL (ref 0.1–1.0)
Monocytes Relative: 7 %
Neutro Abs: 4.1 10*3/uL (ref 1.7–7.7)
Neutrophils Relative %: 49 %
Platelets: 188 10*3/uL (ref 150–400)
RBC: 4.29 MIL/uL (ref 3.87–5.11)
RDW: 14.1 % (ref 11.5–15.5)
WBC: 8.3 10*3/uL (ref 4.0–10.5)
nRBC: 0 % (ref 0.0–0.2)

## 2023-10-02 LAB — LIPASE, BLOOD: Lipase: 37 U/L (ref 11–51)

## 2023-10-02 LAB — COMPREHENSIVE METABOLIC PANEL WITH GFR
ALT: 13 U/L (ref 0–44)
AST: 23 U/L (ref 15–41)
Albumin: 3.4 g/dL — ABNORMAL LOW (ref 3.5–5.0)
Alkaline Phosphatase: 128 U/L — ABNORMAL HIGH (ref 38–126)
Anion gap: 8 (ref 5–15)
BUN: 12 mg/dL (ref 8–23)
CO2: 26 mmol/L (ref 22–32)
Calcium: 9.5 mg/dL (ref 8.9–10.3)
Chloride: 99 mmol/L (ref 98–111)
Creatinine, Ser: 1.22 mg/dL — ABNORMAL HIGH (ref 0.44–1.00)
GFR, Estimated: 42 mL/min — ABNORMAL LOW (ref >60)
Glucose, Bld: 131 mg/dL — ABNORMAL HIGH (ref 70–99)
Potassium: 4 mmol/L (ref 3.5–5.1)
Sodium: 133 mmol/L — ABNORMAL LOW (ref 135–145)
Total Bilirubin: 1.2 mg/dL (ref 0.0–1.2)
Total Protein: 6.8 g/dL (ref 6.5–8.1)

## 2023-10-02 LAB — I-STAT CHEM 8, ED
BUN: 11 mg/dL (ref 8–23)
Calcium, Ion: 1.26 mmol/L (ref 1.15–1.40)
Chloride: 100 mmol/L (ref 98–111)
Creatinine, Ser: 1.2 mg/dL — ABNORMAL HIGH (ref 0.44–1.00)
Glucose, Bld: 131 mg/dL — ABNORMAL HIGH (ref 70–99)
HCT: 38 % (ref 36.0–46.0)
Hemoglobin: 12.9 g/dL (ref 12.0–15.0)
Potassium: 4.1 mmol/L (ref 3.5–5.1)
Sodium: 137 mmol/L (ref 135–145)
TCO2: 24 mmol/L (ref 22–32)

## 2023-10-02 MED ORDER — LACTATED RINGERS IV SOLN: INTRAVENOUS | Status: DC

## 2023-10-02 MED ORDER — FENTANYL CITRATE PF 50 MCG/ML IJ SOSY
50.0000 ug | PREFILLED_SYRINGE | Freq: Once | INTRAMUSCULAR | Status: AC
  Administered 2023-10-02: 50 ug via INTRAVENOUS
  Filled 2023-10-02: qty 1

## 2023-10-02 MED ORDER — IOHEXOL 350 MG/ML SOLN
60.0000 mL | Freq: Once | INTRAVENOUS | Status: AC | PRN
  Administered 2023-10-02: 60 mL via INTRAVENOUS

## 2023-10-02 MED ORDER — METOCLOPRAMIDE HCL 5 MG/ML IJ SOLN
10.0000 mg | Freq: Once | INTRAMUSCULAR | Status: AC
  Administered 2023-10-02: 10 mg via INTRAVENOUS
  Filled 2023-10-02: qty 2

## 2023-10-02 NOTE — ED Provider Notes (Signed)
 [place] EMERGENCY DEPARTMENT AT [facility] Provider Note   CSN: [id number] Arrival date & time: 10/02/23  1354     History  Chief Complaint  Patient presents with  . Emesis    Traci Clark is a 89 y.o. female.  HPI     89 year old patient comes in with chief complaint of vomiting, difficulty swallowing and fainting.  She has history of hypertension, pacemaker, PE off of anticoagulation, diabetes and CKD.  Patient states that 3 days ago, she had a fainting spell.  When she woke up, she threw up.  Ever since then she has been having difficulty swallowing solids.  She has not been able to keep any of her medications down.  Patient states that she gets fainting episodes about 2 or 3 times a week over the last 3 months.  She had PE last year, at that time she had chest pain, shortness of breath.  She is off of her anticoagulation starting in January.  She denies any chest pain, shortness of breath.  There is typically no warning before she faints.  Patient also has history of esophagitis and esophageal strictures.  She spoke with her GI and her PCP is-who recommended that she come to the ER.  Additionally, patient states that over the last few days she has been having right-sided headache which are sharp and burning in nature.  The headaches are located over the parietal region of her head and upper part of her face.  She has no neck pain.   Home Medications Prior to Admission medications   Medication Sig Start Date End Date Taking? Authorizing Provider  acetaminophen  (TYLENOL ) 500 MG tablet Take 1,000 mg by mouth every 6 (six) hours as needed (pain.). Gelcaps   Yes [provider]  albuterol  (VENTOLIN  HFA) 108 (90 Base) MCG/ACT inhaler Inhale 1-2 puffs into the lungs every 6 (six) hours as needed for shortness of breath or wheezing.   Yes [provider]  allopurinol  (ZYLOPRIM ) 100 MG tablet Take 100 mg by mouth in the morning.   Yes [provider]  ALPRAZolam  (XANAX ) 0.5 MG tablet Take 0.5 mg by mouth 3 (three) times daily as needed for anxiety.   Yes [provider]  ciprofloxacin  (CIPRO ) 500 MG tablet Take 500 mg by mouth 2 (two) times daily. 09/23/23  Yes [provider]  dexlansoprazole (DEXILANT) 60 MG capsule Take 60 mg by mouth daily before breakfast.   Yes [provider]  fluticasone (FLONASE) 50 MCG/ACT nasal spray Place 2 sprays into both nostrils daily as needed (allergies (cough/sneezing)). 09/24/20  Yes [provider]  furosemide (LASIX) 20 MG tablet Take 20 mg by mouth in the morning.   Yes [provider]  insulin  aspart (NOVOLOG  FLEXPEN RELION) 100 UNIT/ML FlexPen Inject 4-10 Units into the skin 3 (three) times daily with meals. Per sliding scale >150 = 4 units 200-250 = 6 units 251-350 = 8 units >351 = 10 units   Yes [provider]  Insulin  Glargine (BASAGLAR  KWIKPEN) 100 UNIT/ML Inject 20 Units into the skin at bedtime. 06/15/23  Yes [provider]  latanoprost  (XALATAN ) 0.005 % ophthalmic solution Place 1 drop into both eyes at bedtime. 09/21/20  Yes [provider]  LUTEIN PO Take 40 mg by mouth daily.   Yes [provider]  meclizine  (ANTIVERT ) 25 MG tablet Take 1 tablet (25 mg total) by mouth 3 (three) times daily as needed for dizziness. 07/30/21  Yes Carlan, Joie Narrow  L, NP  Menthol , Topical Analgesic, (ICY HOT BACK EXTRA STRENGTH) 5 % PTCH Place 1 patch onto the skin daily as needed (pain.).   Yes [provider]  nystatin  cream (MYCOSTATIN ) Apply 1 Application topically 2 (two) times daily. 09/28/23  Yes [provider]  ondansetron  (ZOFRAN ) 4 MG tablet Take 4 mg by mouth every 8 (eight) hours as needed for nausea or vomiting. Dissolving tablet 08/21/23  Yes [provider]  phenazopyridine (PYRIDIUM) 200 MG tablet Take 200 mg by mouth 3 (three) times daily as needed for pain. 09/23/23  Yes [provider]  polyethylene glycol (MIRALAX  / GLYCOLAX ) packet Take 34 g by mouth in the morning.   Yes [provider]  potassium chloride (MICRO-K) 10 MEQ CR capsule Take 10 mEq by mouth in the morning.   Yes [provider]  tacrolimus (PROTOPIC) 0.1 % ointment Apply 1 Application topically 2 (two) times daily. 06/23/23  Yes [provider]  traMADol  (ULTRAM ) 50 MG tablet Take 1 tablet (50 mg total) by mouth every 6 (six) hours as needed for severe pain. 04/17/22  Yes Shah, Pratik D, DO  triamcinolone ointment (KENALOG) 0.1 % Apply 1 Application topically daily. 08/19/22  Yes [provider]  clobetasol ointment (TEMOVATE) 0.05 % Apply 1 Application topically 2 (two) times daily as needed (irritation.). 07/22/22   [provider]      Allergies    Amoxil [amoxicillin], Macrobid [nitrofurantoin monohyd macro], Codeine, Darvon [propoxyphene], Phenergan  [promethazine ], Pravachol [pravastatin], Rocephin  [ceftriaxone ], Sulfa antibiotics, Hydrocodone-acetaminophen , Mirtazapine, and Neurontin  [gabapentin ]    Review of Systems   Review of Systems  All other systems reviewed and are negative.   Physical Exam Updated Vital Signs BP (!) 122/95 (BP Location: Left Arm)   Pulse 61   Temp (!) 97.2 F (36.2 C) (Oral)   Resp 17   Ht 5\' 6"  (1.676 m)   Wt 72.3 kg   SpO2 97%   BMI 25.73 kg/m  Physical Exam Vitals and nursing note reviewed.  Constitutional:      Appearance: She is well-developed.  HENT:     Head: Atraumatic.  Eyes:     Extraocular Movements: Extraocular movements intact.     Pupils: Pupils are equal, round, and reactive to light.     Comments: Visual fields are intact  Cardiovascular:     Rate and Rhythm: Normal rate.  Pulmonary:     Effort: Pulmonary effort is normal.  Musculoskeletal:     Cervical back: Normal range of motion and neck supple.  Skin:    General: Skin is warm and dry.  Neurological:     Mental Status: She is alert  and oriented to person, place, and time.     Comments: Cerebellar exam is normal (finger to nose) Sensory exam normal for bilateral upper and lower extremities - and patient is able to discriminate between sharp and dull. Motor exam is 4+/5     ED Results / Procedures / Treatments   Labs (all labs ordered are listed, but only abnormal results are displayed) Labs Reviewed  COMPREHENSIVE METABOLIC PANEL WITH GFR - Abnormal; Notable for the following components:      Result Value   Sodium 133 (*)    Glucose, Bld 131 (*)    Creatinine, Ser 1.22 (*)    Albumin 3.4 (*)    Alkaline Phosphatase 128 (*)    GFR, Estimated 42 (*)    All other components within normal limits  I-STAT CHEM 8, ED -  Abnormal; Notable for the following components:   Creatinine, Ser 1.20 (*)    Glucose, Bld 131 (*)    All other components within normal limits  CBC WITH DIFFERENTIAL/PLATELET  LIPASE, BLOOD    EKG None  Radiology CT Angio Head Neck W WO CM Result Date: 10/02/2023 CLINICAL DATA:  Syncope EXAM: CT ANGIOGRAPHY HEAD AND NECK WITH AND WITHOUT CONTRAST TECHNIQUE: Multidetector CT imaging of the head and neck was performed using the standard protocol during bolus administration of intravenous contrast. Multiplanar CT image reconstructions and MIPs were obtained to evaluate the vascular anatomy. Carotid stenosis measurements (when applicable) are obtained utilizing NASCET criteria, using the distal internal carotid diameter as the denominator. RADIATION DOSE REDUCTION: This exam was performed according to the departmental dose-optimization program which includes automated exposure control, adjustment of the mA and/or kV according to patient size and/or use of iterative reconstruction technique. CONTRAST:  60mL OMNIPAQUE  IOHEXOL  350 MG/ML SOLN COMPARISON:  None Available. FINDINGS: CT HEAD FINDINGS Brain: There is no mass, hemorrhage or extra-axial collection. The size and configuration of the ventricles and  extra-axial CSF spaces are normal. There is hypoattenuation of the white matter, most commonly indicating chronic small vessel disease. 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 Vascular: No hyperdense vessel or unexpected vascular calcification. Skull: The visualized skull base, calvarium and extracranial soft tissues are normal. Sinuses/Orbits: No fluid levels or advanced mucosal thickening of the visualized paranasal sinuses. No mastoid or middle ear effusion. Normal orbits. CTA NECK FINDINGS Skeleton: No acute abnormality or high grade bony spinal canal stenosis. Other neck: Normal pharynx, larynx and major salivary glands. No cervical lymphadenopathy. Unremarkable thyroid  gland. Upper chest: No pneumothorax or pleural effusion. No nodules or masses. Aortic arch: There is no calcific atherosclerosis of the aortic arch. Conventional 3 vessel aortic branching pattern. RIGHT carotid system: No dissection, occlusion or aneurysm. Mild atherosclerotic calcification at the carotid bifurcation without hemodynamically significant stenosis. LEFT carotid system: Normal without aneurysm, dissection or stenosis. Vertebral arteries: Left dominant configuration. Mild multifocal atherosclerosis of the left vertebral artery V2 segment without hemodynamically significant stenosis. The left vertebral artery has a tortuous course with multifocal narrowing and dilatation. Mild irregularity of the right vertebral artery without dissection or hemodynamically significant stenosis. CTA HEAD FINDINGS POSTERIOR CIRCULATION: Mild atherosclerotic calcification of the vertebral artery V4 segments. No proximal occlusion of the anterior or inferior cerebellar arteries. Basilar artery is normal. Superior cerebellar arteries are normal. 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 Severe stenosis of the left PCA P1 and P2 segments. There is occlusion of the left P3 segment. ANTERIOR CIRCULATION: Atherosclerotic calcification of the  internal carotid arteries at the skull base without hemodynamically significant stenosis. Anterior cerebral arteries are normal. Middle cerebral arteries are normal. Venous sinuses: As permitted by contrast timing, patent. Anatomic variants: None Review of the MIP images confirms the above findings. IMPRESSION: 1. 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 2. Severe stenosis of the left PCA P1 and P2 segments with occlusion of the left P3 segment. 3. Tortuous courses of the vertebral arteries with multifocal narrowing and dilatation, which may be due to fibromuscular dysplasia. 4. No hemodynamically significant stenosis of the carotid arteries. Electronically Signed   By: Juanetta Nordmann M.D.   On: 10/02/2023 22:46    Procedures .Critical Care  Performed by: Deatra Face, MD Authorized by: Deatra Face, MD   Critical care provider statement:    Critical care time (minutes):  35   Critical care  time was exclusive of:  Separately billable procedures and treating other patients   Critical care was necessary to treat or prevent imminent or life-threatening deterioration of the following conditions:  CNS failure or compromise   Critical care was time spent personally by me on the following activities:  Development of treatment plan with patient or surrogate, discussions with consultants, evaluation of patient's response to treatment, examination of patient, ordering and review of laboratory studies, ordering and review of radiographic studies, ordering and performing treatments and interventions, pulse oximetry, re-evaluation of patient's condition and review of old charts     Medications Ordered in ED Medications  lactated ringers  infusion ( Intravenous New Bag/Given 10/02/23 2311)  metoCLOPramide  (REGLAN ) injection 10 mg (has no administration in time range)  iohexol  (OMNIPAQUE ) 350 MG/ML injection 60 mL (60 mLs Intravenous Contrast Given 10/02/23 2220)    ED Course/ Medical  Decision Making/ A&P                                 Medical Decision Making Amount and/or Complexity of Data Reviewed Labs: ordered. Radiology: ordered.  Risk Prescription drug management. Decision regarding hospitalization.   This patient presents to the ED with chief complaint(s) of difficulty in swallowing for the last 3 days, particularly with solids and pills.  She is able to swallow water , but has to put effort.  Additionally, she complains of few episodes of syncope and new headache over the right side with pertinent past medical history of PE, currently not on anticoagulation, diabetes, CKD, esophageal strictures and esophagitis.The complaint involves an extensive differential diagnosis and also carries with it a high risk of complications and morbidity.    The differential diagnosis considered for dysphagia includes: Pill esophagitis, Candida esophagitis, esophageal stricture, esophageal spasm, hiatal hernia and stroke.  Differential diagnosis for headache includes brain bleed, trigeminal neuralgia, neuropathic pain.  Differential diagnosis for syncope includes: Orthostatic hypotension Stroke Vertebral artery dissection/stenosis Dysrhythmia PE Vasovagal/neurocardiogenic syncope Aortic stenosis Valvular disorder/Cardiomyopathy Anemia  The initial plan is to get basic labs.  I am perplexed with the syncopal episode she is having.  I ordered CT angio head and neck and CT angio PE.  However, given patient's CKD, she can only get 1 scan and not the other.  We have decided to proceed with CT angio head and neck and she can get VQ scan while admitted.  Currently she is hemodynamically stable, has no chest pain or shortness of breath.   Additional history obtained: Additional history obtained from family Records reviewed previous admission documents previous GI notes.  Independent labs interpretation:  The following labs were independently interpreted: CBC, BMP is normal.  No  profound electrolyte abnormality.  No severe anemia.  Independent visualization and interpretation of imaging: - I independently visualized the following imaging with scope of interpretation limited to determining acute life threatening conditions related to emergency care: CT angio head and neck, which revealed no evidence of brain bleed.  However patient has significant disease of the posterior circulation.  Treatment and Reassessment: Patient continues to have no new symptoms.  She is made aware that we will admit her to the hospital.  Consultation: - Consulted or discussed management/test interpretation with external professional: Dr. Andy Keen, GI.  Patient needs to be n.p.o. after midnight.  They will see the patient tomorrow.  Dr. Romilda Coaster, neurology.  We discussed patient's presenting symptoms, CT angio, syncope and headache.  He states that unlikely  that patient's symptoms are because of stroke, however MRI brain can be done to make sure there is no embolic events.  No need for neuro involvement at this time.    Final Clinical Impression(s) / ED Diagnoses Final diagnoses:  Dysphagia, unspecified type  Syncope and collapse    Rx / DC Orders ED Discharge Orders     None         Deatra Face, MD 10/02/23 2335

## 2023-10-02 NOTE — ED Notes (Signed)
 Pt states she does have pacemaker but is not sure what the brand is and does not have the card with her. Pt states she will have family bring it when they visit in am.

## 2023-10-02 NOTE — Telephone Encounter (Signed)
 Patient called states she has a pill stuck in her throat since Monday ( 3 days ) and has not ate much. States she vomits when she eats and vomits when drinking liquids. She said she thinks she vomited the pill back up but unable to take her meds in the past 3 days. She said she passed out on Monday and again today. Her daughter is there with her now and I advised her to have her daughter take her right away to emergency room. She agreed to go.   [phone]

## 2023-10-02 NOTE — H&P (Signed)
 History and Physical    Patient: Traci Clark VWU:[id number] DOB: 06/22/1930 DOA: 10/02/2023 DOS: the patient was seen and examined on 10/03/2023 PCP: Jolynn Needy, PA  Patient coming from: Home  Chief Complaint:  Chief Complaint  Patient presents with   Emesis   HPI: Traci Clark is a 89 y.o. female with medical history significant of  hypertension, pacemaker, T2DM, GERD, constipation esophagitis, history of esophageal strictures and CKD who presents to the emergency department due to vomiting, difficulty in swallowing and fainting. Patient states that she has been having fainting spells about 2 to 3 times per week within last 3 months, but this worsened within the last week.  3 days ago, she vomited after recovering from a fainting spell and she has been having difficulty in being able to swallow any solid food since this episode, she has also been having difficulty in being able to swallow her medications.  She spoke with her GI and PCP and she was asked to go to the ER for further evaluation. Patient also complained of several days of headache on the right side and in the frontal and parietal area of head.  Headache was intermittent and it was sharp and burning in nature.  ED Course:  In the emergency department, she was hemodynamically stable.  Workup in the ED showed normal CBC and normal BMP except for sodium of 133, blood glucose 131, creatinine 1.22 (creatinine is within baseline range), lipase 37. CT Head and neck with and without contrast showed occlusion of the proximal P3 segment of the right PCA. Severe stenosis of the left PCA P1 and P2 segments with occlusion of the left P3 segment. Tortuous courses of the vertebral arteries with multifocal narrowing and dilatation, which may be due to fibromuscular dysplasia. She was treated with IV fentanyl , Reglan  and IV hydration was provided Gastroenterologist (Dr. Alita Irwin) was consulted and will consult to patient in the  morning with plan to scope her per EDP.  Neurologist (Dr. Alecia Ames) was consulted and recommended MRI of the brain to completely rule out any embolic event.  [facility] was asked to admit patient.  Review of Systems: Review of systems as noted in the HPI. All other systems reviewed and are negative.   Past Medical History:  Diagnosis Date   Anxiety    Arthritis    Chronic constipation    CKD (chronic kidney disease), stage III (HCC)    Colon cancer (HCC)    colon   Complication of anesthesia    Diabetes mellitus without complication (HCC)    DVT (deep vein thrombosis) in pregnancy    Dysphagia    GERD (gastroesophageal reflux disease)    Glaucoma    Gout    H/O hiatal hernia    History of kidney stones    Iliac artery aneurysm, bilateral (HCC)    MI (myocardial infarction) (HCC) 1993   PONV (postoperative nausea and vomiting)    Restless leg syndrome    Seizure (HCC)    withdrawal from xanax    Vertigo    Past Surgical History:  Procedure Laterality Date   BIOPSY  07/21/2020   Procedure: BIOPSY;  Surgeon: Urban Garden, MD;  Location: [facility];  Service: Gastroenterology;;   BIOPSY  03/26/2022   Procedure: BIOPSY;  Surgeon: Urban Garden, MD;  Location: [facility];  Service: Gastroenterology;;   CHOLECYSTECTOMY     COLECTOMY  2003   COLONOSCOPY N/A 07/29/2012   Procedure: COLONOSCOPY;  Surgeon: Mathews Solomons  Homero Luster, MD;  Location: [facility];  Service: Endoscopy;  Laterality: N/A;  100   COLONOSCOPY WITH PROPOFOL  N/A 12/13/2016   Procedure: COLONOSCOPY WITH PROPOFOL ;  Surgeon: Ruby Corporal, MD;  Location: [facility];  Service: Endoscopy;  Laterality: N/A;  8:30   COLONOSCOPY WITH PROPOFOL  N/A 07/21/2020   Procedure: COLONOSCOPY WITH PROPOFOL ;  Surgeon: Urban Garden, MD;  Location: [facility];  Service: Gastroenterology;  Laterality: N/A;  AM   ESOPHAGEAL DILATION  11/03/2015   Procedure: ESOPHAGEAL DILATION;  Surgeon: Ruby Corporal, MD;  [facility]: [facility];  Service: Endoscopy;;   ESOPHAGEAL DILATION N/A 09/26/2017   Procedure: ESOPHAGEAL DILATION;  Surgeon: Ruby Corporal, MD;  [facility]: [facility];  Service: Endoscopy;  Laterality: N/A;   ESOPHAGOGASTRODUODENOSCOPY N/A 03/25/2014   Procedure: ESOPHAGOGASTRODUODENOSCOPY (EGD);  Surgeon: Ruby Corporal, MD;  [facility]: [facility];  Service: Endoscopy;  Laterality: N/A;  1055   ESOPHAGOGASTRODUODENOSCOPY N/A 11/03/2015   Procedure: ESOPHAGOGASTRODUODENOSCOPY (EGD);  Surgeon: Ruby Corporal, MD;  [facility]: [facility];  Service: Endoscopy;  Laterality: N/A;  1:55 - moved to 11:15 - Ann to notify - moved to 12:45 - pt knows to arrive at 11:45   ESOPHAGOGASTRODUODENOSCOPY N/A 09/26/2017   Procedure: ESOPHAGOGASTRODUODENOSCOPY (EGD);  Surgeon: Ruby Corporal, MD;  [facility]: [facility];  Service: Endoscopy;  Laterality: N/A;   ESOPHAGOGASTRODUODENOSCOPY (EGD) WITH PROPOFOL  N/A 07/21/2020   Procedure: ESOPHAGOGASTRODUODENOSCOPY (EGD) WITH PROPOFOL ;  Surgeon: Urban Garden, MD;  [facility]: [facility];  Service: Gastroenterology;  Laterality: N/A;   ESOPHAGOGASTRODUODENOSCOPY (EGD) WITH PROPOFOL  N/A 03/26/2022   Procedure: ESOPHAGOGASTRODUODENOSCOPY (EGD) WITH PROPOFOL ;  Surgeon: Urban Garden, MD;  [facility]: [facility];  Service: Gastroenterology;  Laterality: N/A;  1215 ASA 3   MALONEY DILATION N/A 03/25/2014   Procedure: MALONEY DILATION;  Surgeon: Ruby Corporal, MD;  [facility]: [facility];  Service: Endoscopy;  Laterality: N/A;   PACEMAKER IMPLANT N/A 08/27/2022   Procedure: PACEMAKER IMPLANT;  Surgeon: Efraim Grange, MD;  Location: [facility];  Service: Cardiovascular;  Laterality: N/A;   POLYPECTOMY  12/13/2016   Procedure: POLYPECTOMY;  Surgeon: Ruby Corporal, MD;  Location: [facility];  Service: Endoscopy;;  polyp   POLYPECTOMY  07/21/2020   Procedure: POLYPECTOMY;  Surgeon: Urban Garden, MD;   Location: [facility];  Service: Gastroenterology;;   TOTAL HIP ARTHROPLASTY Left    TUBAL LIGATION     VEIN SURGERY Bilateral    stripped vein due to DVT    Social History:  reports that she has never smoked. She has never been exposed to tobacco smoke. She has never used smokeless tobacco. She reports that she does not drink alcohol and does not use drugs.   Allergies  Allergen Reactions   Amoxil [Amoxicillin] Anaphylaxis and Rash    "Skin peeled off"   Macrobid [Nitrofurantoin Monohyd Macro] Anaphylaxis, Rash and Other (See Comments)    "Skin peeled off"   Codeine Nausea And Vomiting and Other (See Comments)    Shaking    Darvon [Propoxyphene] Nausea And Vomiting and Other (See Comments)    Shaking    Phenergan  [Promethazine ] Other (See Comments)    Altered mental changes   Pravachol [Pravastatin] Other (See Comments)    Myalgias    Rocephin  [Ceftriaxone ] Other (See Comments)    Yeast infection   Sulfa Antibiotics Other (See Comments)    Thrush and itching   Hydrocodone-Acetaminophen  Nausea And Vomiting  and Other (See Comments)    Causes chest to feel heavy     Mirtazapine Other (See Comments)    Does not tolerate   Neurontin  [Gabapentin ] Nausea Only    Feel faint    Family History  Problem Relation Age of Onset   Heart disease Father      Prior to Admission medications   Medication Sig Start Date End Date Taking? Authorizing Provider  acetaminophen  (TYLENOL ) 500 MG tablet Take 1,000 mg by mouth every 6 (six) hours as needed (pain.). Gelcaps   Yes [provider]  albuterol  (VENTOLIN  HFA) 108 (90 Base) MCG/ACT inhaler Inhale 1-2 puffs into the lungs every 6 (six) hours as needed for shortness of breath or wheezing.   Yes [provider]  allopurinol  (ZYLOPRIM ) 100 MG tablet Take 100 mg by mouth in the morning.   Yes [provider]  ALPRAZolam  (XANAX ) 0.5 MG tablet Take 0.5 mg by mouth 3 (three) times daily as needed for anxiety.   Yes  [provider]  ciprofloxacin  (CIPRO ) 500 MG tablet Take 500 mg by mouth 2 (two) times daily. 09/23/23  Yes [provider]  dexlansoprazole (DEXILANT) 60 MG capsule Take 60 mg by mouth daily before breakfast.   Yes [provider]  fluticasone (FLONASE) 50 MCG/ACT nasal spray Place 2 sprays into both nostrils daily as needed (allergies (cough/sneezing)). 09/24/20  Yes [provider]  furosemide (LASIX) 20 MG tablet Take 20 mg by mouth in the morning.   Yes [provider]  insulin  aspart (NOVOLOG  FLEXPEN RELION) 100 UNIT/ML FlexPen Inject 4-10 Units into the skin 3 (three) times daily with meals. Per sliding scale >150 = 4 units 200-250 = 6 units 251-350 = 8 units >351 = 10 units   Yes [provider]  Insulin  Glargine (BASAGLAR  KWIKPEN) 100 UNIT/ML Inject 20 Units into the skin at bedtime. 06/15/23  Yes [provider]  latanoprost  (XALATAN ) 0.005 % ophthalmic solution Place 1 drop into both eyes at bedtime. 09/21/20  Yes [provider]  LUTEIN PO Take 40 mg by mouth daily.   Yes [provider]  meclizine  (ANTIVERT ) 25 MG tablet Take 1 tablet (25 mg total) by mouth 3 (three) times daily as needed for dizziness. 07/30/21  Yes Carlan, Chelsea L, NP  Menthol , Topical Analgesic, (ICY HOT BACK EXTRA STRENGTH) 5 % PTCH Place 1 patch onto the skin daily as needed (pain.).   Yes [provider]  nystatin  cream (MYCOSTATIN ) Apply 1 Application topically 2 (two) times daily. 09/28/23  Yes [provider]  ondansetron  (ZOFRAN ) 4 MG tablet Take 4 mg by mouth every 8 (eight) hours as needed for nausea or vomiting. Dissolving tablet 08/21/23  Yes [provider]  phenazopyridine (PYRIDIUM) 200 MG tablet Take 200 mg by mouth 3 (three) times daily as needed for pain. 09/23/23  Yes [provider]  polyethylene glycol (MIRALAX  / GLYCOLAX ) packet Take 34 g by mouth in the morning.   Yes [provider]  potassium chloride (MICRO-K) 10 MEQ CR capsule Take 10 mEq by mouth in the morning.   Yes [provider]  tacrolimus (PROTOPIC) 0.1 % ointment Apply 1 Application topically 2 (two) times daily. 06/23/23  Yes [provider]  traMADol  (ULTRAM ) 50 MG tablet Take 1 tablet (50 mg total) by mouth every 6 (six) hours as needed for severe pain. 04/17/22  Yes Shah, Pratik D, DO  triamcinolone ointment (KENALOG) 0.1 % Apply 1 Application topically daily. 08/19/22  Yes [provider]  clobetasol ointment (TEMOVATE) 0.05 % Apply 1 Application topically 2 (two) times daily as needed (irritation.). 07/22/22   [provider]    Physical Exam: BP (!) 151/78 (BP Location: Left Arm)   Pulse 66   Temp 98.1 F (36.7 C) (Oral)   Resp 16   Ht 5\' 6"  (1.676 m)   Wt 71.7 kg   SpO2 95%   BMI 25.51 kg/m   General: 89 y.o. year-old female well developed well nourished in no acute distress.  Alert and oriented x3. HEENT: NCAT, EOMI Neck: Supple, trachea medial Cardiovascular: Regular rate and rhythm with no rubs or gallops.  No thyromegaly or JVD noted.  No lower extremity edema. 2/4 pulses in all 4 extremities. Respiratory: Clear to auscultation with no wheezes or rales. Good inspiratory effort. Abdomen: Soft, nontender nondistended with normal bowel sounds x4 quadrants. Muskuloskeletal: No cyanosis, clubbing or edema noted bilaterally Neuro: CN II-XII intact, strength 5/5 x 4, sensation, reflexes intact Skin: No ulcerative lesions noted or rashes Psychiatry: Judgement and insight appear normal. Mood is appropriate for condition and setting          Labs on Admission:  Basic Metabolic Panel: Recent Labs  Lab 10/02/23 1526 10/02/23 1534  NA 133* 137  K 4.0 4.1  CL 99 100  CO2 26  --   GLUCOSE 131* 131*  BUN 12 11  CREATININE 1.22* 1.20*  CALCIUM  9.5  --    Liver Function Tests: Recent Labs  Lab 10/02/23 1526  AST 23  ALT 13  ALKPHOS 128*  BILITOT 1.2   PROT 6.8  ALBUMIN 3.4*   Recent Labs  Lab 10/02/23 1526  LIPASE 37   No results for input(s): "AMMONIA" in the last 168 hours. CBC: Recent Labs  Lab 10/02/23 1526 10/02/23 1534  WBC 8.3  --   NEUTROABS 4.1  --   HGB 12.5 12.9  HCT 37.3 38.0  MCV 86.9  --   PLT 188  --    Cardiac Enzymes: No results for input(s): "CKTOTAL", "CKMB", "CKMBINDEX", "TROPONINI" in the last 168 hours.  BNP (last 3 results) No results for input(s): "BNP" in the last 8760 hours.  ProBNP (last 3 results) No results for input(s): "PROBNP" in the last 8760 hours.  CBG: Recent Labs  Lab 10/03/23 0041  GLUCAP 113*    Radiological Exams on Admission: CT Angio Head Neck W WO CM Result Date: 10/02/2023 CLINICAL DATA:  Syncope EXAM: CT ANGIOGRAPHY HEAD AND NECK WITH AND WITHOUT CONTRAST TECHNIQUE: Multidetector CT imaging of the head and neck was performed using the standard protocol during bolus administration of intravenous contrast. Multiplanar CT image reconstructions and MIPs were obtained to evaluate the vascular anatomy. Carotid stenosis measurements (when applicable) are obtained utilizing NASCET criteria, using the distal internal carotid diameter as the denominator. RADIATION DOSE REDUCTION: This exam was performed according to the departmental dose-optimization program which includes automated exposure control, adjustment of the mA and/or kV according to patient size and/or use of iterative reconstruction technique. CONTRAST:  60mL OMNIPAQUE  IOHEXOL  350 MG/ML SOLN COMPARISON:  None Available. FINDINGS: CT HEAD FINDINGS Brain: There is no mass, hemorrhage or extra-axial collection. The size and configuration of the ventricles and extra-axial CSF spaces are normal. There is hypoattenuation of the white matter, most commonly indicating chronic small vessel disease. 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 Vascular: No hyperdense vessel or unexpected vascular calcification. Skull: The visualized skull  base, calvarium and extracranial soft tissues are normal.  Sinuses/Orbits: No fluid levels or advanced mucosal thickening of the visualized paranasal sinuses. No mastoid or middle ear effusion. Normal orbits. CTA NECK FINDINGS Skeleton: No acute abnormality or high grade bony spinal canal stenosis. Other neck: Normal pharynx, larynx and major salivary glands. No cervical lymphadenopathy. Unremarkable thyroid  gland. Upper chest: No pneumothorax or pleural effusion. No nodules or masses. Aortic arch: There is no calcific atherosclerosis of the aortic arch. Conventional 3 vessel aortic branching pattern. RIGHT carotid system: No dissection, occlusion or aneurysm. Mild atherosclerotic calcification at the carotid bifurcation without hemodynamically significant stenosis. LEFT carotid system: Normal without aneurysm, dissection or stenosis. Vertebral arteries: Left dominant configuration. Mild multifocal atherosclerosis of the left vertebral artery V2 segment without hemodynamically significant stenosis. The left vertebral artery has a tortuous course with multifocal narrowing and dilatation. Mild irregularity of the right vertebral artery without dissection or hemodynamically significant stenosis. CTA HEAD FINDINGS POSTERIOR CIRCULATION: Mild atherosclerotic calcification of the vertebral artery V4 segments. No proximal occlusion of the anterior or inferior cerebellar arteries. Basilar artery is normal. Superior cerebellar arteries are normal. 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 Severe stenosis of the left PCA P1 and P2 segments. There is occlusion of the left P3 segment. ANTERIOR CIRCULATION: Atherosclerotic calcification of the internal carotid arteries at the skull base without hemodynamically significant stenosis. Anterior cerebral arteries are normal. Middle cerebral arteries are normal. Venous sinuses: As permitted by contrast timing, patent. Anatomic variants: None Review of the MIP images  confirms the above findings. IMPRESSION: 1. 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 2. Severe stenosis of the left PCA P1 and P2 segments with occlusion of the left P3 segment. 3. Tortuous courses of the vertebral arteries with multifocal narrowing and dilatation, which may be due to fibromuscular dysplasia. 4. No hemodynamically significant stenosis of the carotid arteries. Electronically Signed   By: Juanetta Nordmann M.D.   On: 10/02/2023 22:46    EKG: I independently viewed the EKG done and my findings are as followed: EKG was not done in the ED  Assessment/Plan Present on Admission:  Esophagitis  Syncope and collapse  Chronic kidney disease, stage 3b (HCC)  Essential hypertension  GERD (gastroesophageal reflux disease)  Principal Problem:   Esophagitis Active Problems:   GERD (gastroesophageal reflux disease)   Type 2 diabetes mellitus without complication, with long-term current use of insulin  (HCC)   Essential hypertension   Chronic kidney disease, stage 3b (HCC)   Syncope and collapse   Hypoalbuminemia due to protein-calorie malnutrition (HCC)  Dysphagia Differential diagnosis may be esophageal stricture/pill esophagitis Gastroenterology (Dr. Alita Irwin) was consulted and will see patient in the morning with plan to scope her  Patient will be kept n.p.o.  Syncope and collapse Patient has history of sinus bradycardia and second-degree AV block and she is s/p placement of a [employer] dual-chamber pacemaker Continue telemetry and watch for arrhythmias Echocardiogram done in January 2024 showed LVEF of 55 to 60%.  No RWMA.  Moderate LVH.  G1 DD Echocardiogram will be done to rule out significant aortic stenosis or other outflow obstruction, and also to evaluate EF and to rule out segmental/Regional wall motion abnormalities.  CT angiography of head and neck showed occlusion of the proximal P3 segment of the right PCA.  Severe stenosis of the left PCA P1 and P2 segments  with occlusion of the left P3 segment.  Questionable acute stroke Neurologist (Dr. Alecia Ames) was consulted and does not think patient's symptoms were due to  stroke MRI was recommended to rule out embolic events.  Unfortunately, patient has a pacemaker and may not be able to have MRI head at [facility]. Patient may have to get the MRI at [facility] or as an outpatient.  Chronic kidney disease stage IIIb Creatinine 1.22 (creatinine is within baseline range) Renally adjust medications, avoid nephrotoxic agents/dehydration/hypotension  Hypoalbuminemia possibly secondary to mild protein calorie malnutrition Albumin 3.4, protein supplement will be provided  Essential hypertension Continue Lasix per home regimen  Type 2 diabetes mellitus Hemoglobin A1c on 06/26/2022 was 6.9 ([facility] - [facility]) Continue ISS and hypoglycemia protocol  GERD Continue PPI  DVT prophylaxis: SCDs  Code Status: Full  Family Communication: None at bedside  Consults: Gastroenterology   Severity of Illness: The appropriate patient status for this patient is OBSERVATION. Observation status is judged to be reasonable and necessary in order to provide the required intensity of service to ensure the patient's safety. The patient's presenting symptoms, physical exam findings, and initial radiographic and laboratory data in the context of their medical condition is felt to place them at decreased risk for further clinical deterioration. Furthermore, it is anticipated that the patient will be medically stable for discharge from the hospital within 2 midnights of admission.   Author: Zakyah Yanes, DO 10/03/2023 2:34 AM  For on call review [email or web address].

## 2023-10-02 NOTE — H&P (Incomplete)
 History and Physical    Patient: Traci Clark UXL:[id number] DOB: [birth year] DOA: 10/02/2023 DOS: the patient was seen and examined on 10/02/2023 PCP: Jolynn Needy, PA  Patient coming from: Home  Chief Complaint:  Chief Complaint  Patient presents with  . Emesis   HPI: [name] is a 89 y.o. female with medical history significant of  hypertension, T2DM, GERD, constipation and gout who presents to the emergency department due to     Review of Systems: {ROS_Text:26778} Past Medical History:  Diagnosis Date  . Anxiety   . Arthritis   . Chronic constipation   . CKD (chronic kidney disease), stage III (HCC)   . Colon cancer (HCC)    colon  . Complication of anesthesia   . Diabetes mellitus without complication (HCC)   . DVT (deep vein thrombosis) in pregnancy   . Dysphagia   . GERD (gastroesophageal reflux disease)   . Glaucoma   . Gout   . H/O hiatal hernia   . History of kidney stones   . Iliac artery aneurysm, bilateral (HCC)   . MI (myocardial infarction) (HCC) 1993  . PONV (postoperative nausea and vomiting)   . Restless leg syndrome   . Seizure (HCC)    withdrawal from xanax   . Vertigo    Past Surgical History:  Procedure Laterality Date  . BIOPSY  07/21/2020   Procedure: BIOPSY;  Surgeon: Urban Garden, MD;  [facility];  Service: Gastroenterology;;  . BIOPSY  03/26/2022   Procedure: BIOPSY;  Surgeon: Urban Garden, MD;  [facility];  Service: Gastroenterology;;  . CHOLECYSTECTOMY    . COLECTOMY  2003  . COLONOSCOPY N/A 07/29/2012   Procedure: COLONOSCOPY;  Surgeon: Ruby Corporal, MD;  [facility];  Service: Endoscopy;  Laterality: N/A;  100  . COLONOSCOPY WITH PROPOFOL  N/A 12/13/2016   Procedure: COLONOSCOPY WITH PROPOFOL ;  Surgeon: Ruby Corporal, MD;  [facility];  Service: Endoscopy;  Laterality: N/A;  8:30  . COLONOSCOPY WITH PROPOFOL  N/A 07/21/2020   Procedure:  COLONOSCOPY WITH PROPOFOL ;  Surgeon: Urban Garden, MD;  [facility];  Service: Gastroenterology;  Laterality: N/A;  AM  . ESOPHAGEAL DILATION  11/03/2015   Procedure: ESOPHAGEAL DILATION;  Surgeon: Ruby Corporal, MD;  [facility];  Service: Endoscopy;;  . ESOPHAGEAL DILATION N/A 09/26/2017   Procedure: ESOPHAGEAL DILATION;  Surgeon: Ruby Corporal, MD;  [facility];  Service: Endoscopy;  Laterality: N/A;  . ESOPHAGOGASTRODUODENOSCOPY N/A 03/25/2014   Procedure: ESOPHAGOGASTRODUODENOSCOPY (EGD);  Surgeon: Ruby Corporal, MD;  [facility];  Service: Endoscopy;  Laterality: N/A;  1055  . ESOPHAGOGASTRODUODENOSCOPY N/A 11/03/2015   Procedure: ESOPHAGOGASTRODUODENOSCOPY (EGD);  Surgeon: Ruby Corporal, MD;  [facility];  Service: Endoscopy;  Laterality: N/A;  1:55 - moved to 11:15 - Ann to notify - moved to 12:45 - pt knows to arrive at 11:45  . ESOPHAGOGASTRODUODENOSCOPY N/A 09/26/2017   Procedure: ESOPHAGOGASTRODUODENOSCOPY (EGD);  Surgeon: Ruby Corporal, MD;  [facility];  Service: Endoscopy;  Laterality: N/A;  . ESOPHAGOGASTRODUODENOSCOPY (EGD) WITH PROPOFOL  N/A 07/21/2020   Procedure: ESOPHAGOGASTRODUODENOSCOPY (EGD) WITH PROPOFOL ;  Surgeon: Urban Garden, MD;  [facility];  Service: Gastroenterology;  Laterality: N/A;  . ESOPHAGOGASTRODUODENOSCOPY (EGD) WITH PROPOFOL  N/A 03/26/2022   Procedure: ESOPHAGOGASTRODUODENOSCOPY (EGD) WITH PROPOFOL ;  Surgeon: Urban Garden, MD;  [facility];  Service: Gastroenterology;  Laterality:  N/A;  1215 ASA 3  . MALONEY DILATION N/A 03/25/2014   Procedure: Londa Rival DILATION;  Surgeon: Ruby Corporal, MD;  [facility];  Service: Endoscopy;  Laterality: N/A;  . PACEMAKER IMPLANT N/A 08/27/2022   Procedure: PACEMAKER IMPLANT;  Surgeon: Efraim Grange, MD;  [facility];  Service: Cardiovascular;  Laterality:  N/A;  . POLYPECTOMY  12/13/2016   Procedure: POLYPECTOMY;  Surgeon: Ruby Corporal, MD;  [facility];  Service: Endoscopy;;  polyp  . POLYPECTOMY  07/21/2020   Procedure: POLYPECTOMY;  Surgeon: Umberto Ganong, Bearl Limes, MD;  [facility];  Service: Gastroenterology;;  . TOTAL HIP ARTHROPLASTY Left   . TUBAL LIGATION    . VEIN SURGERY Bilateral    stripped vein due to DVT   Social History:  reports that she has never smoked. She has never been exposed to tobacco smoke. She has never used smokeless tobacco. She reports that she does not drink alcohol and does not use drugs.  Allergies  Allergen Reactions  . Amoxil [Amoxicillin] Anaphylaxis and Rash    "Skin peeled off"  . Macrobid [Nitrofurantoin Monohyd Macro] Anaphylaxis, Rash and Other (See Comments)    "Skin peeled off"  . Codeine Nausea And Vomiting and Other (See Comments)    Shaking   . Darvon [Propoxyphene] Nausea And Vomiting and Other (See Comments)    Shaking   . Phenergan  [Promethazine ] Other (See Comments)    Altered mental changes  . Pravachol [Pravastatin] Other (See Comments)    Myalgias   . Rocephin  [Ceftriaxone ] Other (See Comments)    Yeast infection  . Sulfa Antibiotics Other (See Comments)    Thrush and itching  . Hydrocodone-Acetaminophen  Nausea And Vomiting and Other (See Comments)    Causes chest to feel heavy    . Mirtazapine Other (See Comments)    Does not tolerate  . Neurontin  [Gabapentin ] Nausea Only    Feel faint    Family History  Problem Relation Age of Onset  . Heart disease Father     Prior to Admission medications   Medication Sig Start Date End Date Taking? Authorizing Provider  acetaminophen  (TYLENOL ) 500 MG tablet Take 1,000 mg by mouth every 6 (six) hours as needed (pain.). Gelcaps   Yes [provider]  albuterol  (VENTOLIN  HFA) 108 (90 Base) MCG/ACT inhaler Inhale 1-2 puffs into the lungs every 6 (six) hours as needed for shortness of breath or  wheezing.   Yes [provider]  allopurinol  (ZYLOPRIM ) 100 MG tablet Take 100 mg by mouth in the morning.   Yes [provider]  ALPRAZolam  (XANAX ) 0.5 MG tablet Take 0.5 mg by mouth 3 (three) times daily as needed for anxiety.   Yes [provider]  ciprofloxacin  (CIPRO ) 500 MG tablet Take 500 mg by mouth 2 (two) times daily. 09/23/23  Yes [provider]  dexlansoprazole (DEXILANT) 60 MG capsule Take 60 mg by mouth daily before breakfast.   Yes [provider]  fluticasone (FLONASE) 50 MCG/ACT nasal spray Place 2 sprays into both nostrils daily as needed (allergies (cough/sneezing)). 09/24/20  Yes [provider]  furosemide (LASIX) 20 MG tablet Take 20 mg by mouth in the morning.   Yes [provider]  insulin  aspart (NOVOLOG  FLEXPEN RELION) 100 UNIT/ML FlexPen Inject 4-10 Units into the skin 3 (three) times daily with meals. Per sliding scale >150 = 4 units 200-250 = 6 units 251-350 = 8 units >351 = 10 units  Yes [provider]  Insulin  Glargine (BASAGLAR  KWIKPEN) 100 UNIT/ML Inject 20 Units into the skin at bedtime. 06/15/23  Yes [provider]  latanoprost  (XALATAN ) 0.005 % ophthalmic solution Place 1 drop into both eyes at bedtime. 09/21/20  Yes [provider]  LUTEIN PO Take 40 mg by mouth daily.   Yes [provider]  meclizine  (ANTIVERT ) 25 MG tablet Take 1 tablet (25 mg total) by mouth 3 (three) times daily as needed for dizziness. 07/30/21  Yes Carlan, Chelsea L, NP  Menthol , Topical Analgesic, (ICY HOT BACK EXTRA STRENGTH) 5 % PTCH Place 1 patch onto the skin daily as needed (pain.).   Yes [provider]  nystatin  cream (MYCOSTATIN ) Apply 1 Application topically 2 (two) times daily. 09/28/23  Yes [provider]  ondansetron  (ZOFRAN ) 4 MG tablet Take 4 mg by mouth every 8 (eight) hours as needed for nausea or vomiting. Dissolving tablet 08/21/23  Yes [provider]   phenazopyridine (PYRIDIUM) 200 MG tablet Take 200 mg by mouth 3 (three) times daily as needed for pain. 09/23/23  Yes [provider]  polyethylene glycol (MIRALAX  / GLYCOLAX ) packet Take 34 g by mouth in the morning.   Yes [provider]  potassium chloride (MICRO-K) 10 MEQ CR capsule Take 10 mEq by mouth in the morning.   Yes [provider]  tacrolimus (PROTOPIC) 0.1 % ointment Apply 1 Application topically 2 (two) times daily. 06/23/23  Yes [provider]  traMADol  (ULTRAM ) 50 MG tablet Take 1 tablet (50 mg total) by mouth every 6 (six) hours as needed for severe pain. 04/17/22  Yes Shah, Pratik D, DO  triamcinolone ointment (KENALOG) 0.1 % Apply 1 Application topically daily. 08/19/22  Yes [provider]  clobetasol ointment (TEMOVATE) 0.05 % Apply 1 Application topically 2 (two) times daily as needed (irritation.). 07/22/22   [provider]    Physical Exam: Vitals:   10/02/23 1428 10/02/23 1746 10/02/23 2310 10/02/23 2320  BP:  133/77 (!) 122/95   Pulse:  63 61 64  Resp:  16 17   Temp:  (!) 96.8 F (36 C)  97.7 F (36.5 C)  TempSrc:    Oral  SpO2:  97% 97% 95%  Weight: 72.3 kg     Height: 5\' 6"  (1.676 m)      *** Data Reviewed: {Tip this will not be part of the note when signed- Document your independent interpretation of telemetry tracing, EKG, lab, Radiology test or any other diagnostic tests. Add any new diagnostic test ordered today. (Optional):26781} {Results:26384}  Assessment and Plan: No notes have been filed under this hospital service. Service: Hospitalist     Advance Care Planning:   Code Status: Prior ***  Consults: ***  Family Communication: ***  Severity of Illness: {Observation/Inpatient:21159}  Author: Twilla Galea, DO 10/02/2023 11:50 PM  For on call review [email or web address].

## 2023-10-02 NOTE — ED Triage Notes (Signed)
 Pt arrived via POV c/o emesis and feeling like one of her pills she tried swallowing on Monday is causing the problems. Pt also reports passing out twice this week. Pt reports she is unsure if she still has the pill stuck in her throat and reports it is making her lightheaded. Pt reports Hx of problems with her esophagus.

## 2023-10-02 NOTE — ED Notes (Signed)
 Pt stated, "I am having a headache." Denies N/V

## 2023-10-02 NOTE — ED Notes (Signed)
Pt went to restroom

## 2023-10-03 ENCOUNTER — Inpatient Hospital Stay (HOSPITAL_COMMUNITY): Admitting: Certified Registered"

## 2023-10-03 ENCOUNTER — Other Ambulatory Visit (HOSPITAL_COMMUNITY): Payer: Self-pay | Admitting: *Deleted

## 2023-10-03 ENCOUNTER — Inpatient Hospital Stay (HOSPITAL_COMMUNITY)

## 2023-10-03 ENCOUNTER — Ambulatory Visit (HOSPITAL_COMMUNITY)

## 2023-10-03 ENCOUNTER — Encounter (HOSPITAL_COMMUNITY): Payer: Self-pay | Admitting: Internal Medicine

## 2023-10-03 ENCOUNTER — Encounter (HOSPITAL_COMMUNITY)
Admission: EM | Disposition: A | Discharge status: Home / Self Care | Payer: Self-pay | Source: Home / Self Care | Attending: Internal Medicine

## 2023-10-03 DIAGNOSIS — K222 Esophageal obstruction: Principal | ICD-10-CM

## 2023-10-03 DIAGNOSIS — I441 Atrioventricular block, second degree: Secondary | ICD-10-CM | POA: Diagnosis present

## 2023-10-03 DIAGNOSIS — R55 Syncope and collapse: Secondary | ICD-10-CM | POA: Diagnosis not present

## 2023-10-03 DIAGNOSIS — Z881 Allergy status to other antibiotic agents status: Secondary | ICD-10-CM | POA: Diagnosis not present

## 2023-10-03 DIAGNOSIS — Z880 Allergy status to penicillin: Secondary | ICD-10-CM | POA: Diagnosis not present

## 2023-10-03 DIAGNOSIS — E119 Type 2 diabetes mellitus without complications: Secondary | ICD-10-CM | POA: Diagnosis not present

## 2023-10-03 DIAGNOSIS — Q394 Esophageal web: Secondary | ICD-10-CM

## 2023-10-03 DIAGNOSIS — K921 Melena: Secondary | ICD-10-CM | POA: Diagnosis present

## 2023-10-03 DIAGNOSIS — E1122 Type 2 diabetes mellitus with diabetic chronic kidney disease: Secondary | ICD-10-CM | POA: Diagnosis present

## 2023-10-03 DIAGNOSIS — K449 Diaphragmatic hernia without obstruction or gangrene: Secondary | ICD-10-CM

## 2023-10-03 DIAGNOSIS — E8809 Other disorders of plasma-protein metabolism, not elsewhere classified: Secondary | ICD-10-CM | POA: Diagnosis present

## 2023-10-03 DIAGNOSIS — I129 Hypertensive chronic kidney disease with stage 1 through stage 4 chronic kidney disease, or unspecified chronic kidney disease: Secondary | ICD-10-CM | POA: Diagnosis present

## 2023-10-03 DIAGNOSIS — K317 Polyp of stomach and duodenum: Secondary | ICD-10-CM | POA: Diagnosis present

## 2023-10-03 DIAGNOSIS — I252 Old myocardial infarction: Secondary | ICD-10-CM | POA: Diagnosis not present

## 2023-10-03 DIAGNOSIS — Z794 Long term (current) use of insulin: Secondary | ICD-10-CM | POA: Diagnosis not present

## 2023-10-03 DIAGNOSIS — H409 Unspecified glaucoma: Secondary | ICD-10-CM | POA: Diagnosis present

## 2023-10-03 DIAGNOSIS — Z86711 Personal history of pulmonary embolism: Secondary | ICD-10-CM | POA: Diagnosis not present

## 2023-10-03 DIAGNOSIS — K2289 Other specified disease of esophagus: Secondary | ICD-10-CM | POA: Diagnosis not present

## 2023-10-03 DIAGNOSIS — K219 Gastro-esophageal reflux disease without esophagitis: Secondary | ICD-10-CM | POA: Diagnosis present

## 2023-10-03 DIAGNOSIS — R1310 Dysphagia, unspecified: Secondary | ICD-10-CM

## 2023-10-03 DIAGNOSIS — I10 Essential (primary) hypertension: Secondary | ICD-10-CM

## 2023-10-03 DIAGNOSIS — Z8249 Family history of ischemic heart disease and other diseases of the circulatory system: Secondary | ICD-10-CM | POA: Diagnosis not present

## 2023-10-03 DIAGNOSIS — K209 Esophagitis, unspecified without bleeding: Secondary | ICD-10-CM

## 2023-10-03 DIAGNOSIS — Z79899 Other long term (current) drug therapy: Secondary | ICD-10-CM | POA: Diagnosis not present

## 2023-10-03 DIAGNOSIS — D631 Anemia in chronic kidney disease: Secondary | ICD-10-CM | POA: Diagnosis present

## 2023-10-03 DIAGNOSIS — F419 Anxiety disorder, unspecified: Secondary | ICD-10-CM | POA: Diagnosis present

## 2023-10-03 DIAGNOSIS — N1832 Chronic kidney disease, stage 3b: Secondary | ICD-10-CM | POA: Diagnosis present

## 2023-10-03 DIAGNOSIS — M109 Gout, unspecified: Secondary | ICD-10-CM | POA: Diagnosis present

## 2023-10-03 DIAGNOSIS — Z950 Presence of cardiac pacemaker: Secondary | ICD-10-CM | POA: Diagnosis not present

## 2023-10-03 DIAGNOSIS — D649 Anemia, unspecified: Secondary | ICD-10-CM | POA: Diagnosis not present

## 2023-10-03 DIAGNOSIS — G2581 Restless legs syndrome: Secondary | ICD-10-CM | POA: Diagnosis present

## 2023-10-03 DIAGNOSIS — R1110 Vomiting, unspecified: Secondary | ICD-10-CM | POA: Diagnosis present

## 2023-10-03 HISTORY — PX: ESOPHAGEAL DILATION: SHX303

## 2023-10-03 HISTORY — PX: ESOPHAGOGASTRODUODENOSCOPY: SHX5428

## 2023-10-03 LAB — ECHOCARDIOGRAM COMPLETE
AR max vel: 2.87 cm2
AV Area VTI: 3.04 cm2
AV Area mean vel: 2.63 cm2
AV Mean grad: 2 mmHg
AV Peak grad: 3.6 mmHg
Ao pk vel: 0.95 m/s
Area-P 1/2: 3.12 cm2
Height: 66 in
S' Lateral: 2.6 cm
Weight: 2529.12 [oz_av]

## 2023-10-03 LAB — COMPREHENSIVE METABOLIC PANEL WITH GFR
ALT: 12 U/L (ref 0–44)
AST: 21 U/L (ref 15–41)
Albumin: 3 g/dL — ABNORMAL LOW (ref 3.5–5.0)
Alkaline Phosphatase: 110 U/L (ref 38–126)
Anion gap: 7 (ref 5–15)
BUN: 11 mg/dL (ref 8–23)
CO2: 26 mmol/L (ref 22–32)
Calcium: 9.1 mg/dL (ref 8.9–10.3)
Chloride: 103 mmol/L (ref 98–111)
Creatinine, Ser: 1.11 mg/dL — ABNORMAL HIGH (ref 0.44–1.00)
GFR, Estimated: 47 mL/min — ABNORMAL LOW (ref >60)
Glucose, Bld: 108 mg/dL — ABNORMAL HIGH (ref 70–99)
Potassium: 3.9 mmol/L (ref 3.5–5.1)
Sodium: 136 mmol/L (ref 135–145)
Total Bilirubin: 1.7 mg/dL — ABNORMAL HIGH (ref 0.0–1.2)
Total Protein: 6 g/dL — ABNORMAL LOW (ref 6.5–8.1)

## 2023-10-03 LAB — CBC
HCT: 35.1 % — ABNORMAL LOW (ref 36.0–46.0)
Hemoglobin: 11.8 g/dL — ABNORMAL LOW (ref 12.0–15.0)
MCH: 28.9 pg (ref 26.0–34.0)
MCHC: 33.6 g/dL (ref 30.0–36.0)
MCV: 85.8 fL (ref 80.0–100.0)
Platelets: 161 10*3/uL (ref 150–400)
RBC: 4.09 MIL/uL (ref 3.87–5.11)
RDW: 14.2 % (ref 11.5–15.5)
WBC: 7.1 10*3/uL (ref 4.0–10.5)
nRBC: 0 % (ref 0.0–0.2)

## 2023-10-03 LAB — HEMOGLOBIN A1C
Hgb A1c MFr Bld: 6.8 % — ABNORMAL HIGH (ref 4.8–5.6)
Hgb A1c MFr Bld: 6.8 % — ABNORMAL HIGH (ref 4.8–5.6)
Mean Plasma Glucose: 148.46 mg/dL
Mean Plasma Glucose: 148.46 mg/dL

## 2023-10-03 LAB — GLUCOSE, CAPILLARY
Glucose-Capillary: 111 mg/dL — ABNORMAL HIGH (ref 70–99)
Glucose-Capillary: 113 mg/dL — ABNORMAL HIGH (ref 70–99)
Glucose-Capillary: 119 mg/dL — ABNORMAL HIGH (ref 70–99)
Glucose-Capillary: 128 mg/dL — ABNORMAL HIGH (ref 70–99)
Glucose-Capillary: 137 mg/dL — ABNORMAL HIGH (ref 70–99)
Glucose-Capillary: 168 mg/dL — ABNORMAL HIGH (ref 70–99)

## 2023-10-03 LAB — MAGNESIUM: Magnesium: 1.9 mg/dL (ref 1.7–2.4)

## 2023-10-03 LAB — PHOSPHORUS: Phosphorus: 3.1 mg/dL (ref 2.5–4.6)

## 2023-10-03 SURGERY — EGD (ESOPHAGOGASTRODUODENOSCOPY)
Anesthesia: General

## 2023-10-03 MED ORDER — ONDANSETRON HCL 4 MG/2ML IJ SOLN
4.0000 mg | Freq: Four times a day (QID) | INTRAMUSCULAR | Status: DC | PRN
  Administered 2023-10-04: 4 mg via INTRAVENOUS
  Filled 2023-10-03: qty 2

## 2023-10-03 MED ORDER — PANTOPRAZOLE SODIUM 40 MG PO TBEC
40.0000 mg | DELAYED_RELEASE_TABLET | Freq: Every day | ORAL | Status: DC
  Filled 2023-10-03: qty 1

## 2023-10-03 MED ORDER — LACTATED RINGERS IV SOLN: INTRAVENOUS | Status: DC

## 2023-10-03 MED ORDER — ENSURE ENLIVE PO LIQD: 237.0000 mL | Freq: Two times a day (BID) | ORAL | Status: DC

## 2023-10-03 MED ORDER — INSULIN ASPART 100 UNIT/ML IJ SOLN
0.0000 [IU] | INTRAMUSCULAR | Status: DC
  Administered 2023-10-03: 2 [IU] via SUBCUTANEOUS

## 2023-10-03 MED ORDER — PROPOFOL 500 MG/50ML IV EMUL
INTRAVENOUS | Status: DC | PRN
  Administered 2023-10-03: 85 ug/kg/min via INTRAVENOUS

## 2023-10-03 MED ORDER — LACTATED RINGERS IV SOLN
INTRAVENOUS | Status: DC
Start: 2023-10-03 — End: 2023-10-04

## 2023-10-03 MED ORDER — LACTATED RINGERS IV SOLN: INTRAVENOUS | Status: DC | PRN

## 2023-10-03 MED ORDER — ONDANSETRON HCL 4 MG PO TABS: 4.0000 mg | ORAL_TABLET | Freq: Four times a day (QID) | ORAL | Status: DC | PRN

## 2023-10-03 MED ORDER — INSULIN ASPART 100 UNIT/ML IJ SOLN: 0.0000 [IU] | Freq: Every day | INTRAMUSCULAR | Status: DC

## 2023-10-03 MED ORDER — PANTOPRAZOLE SODIUM 40 MG IV SOLR
40.0000 mg | Freq: Two times a day (BID) | INTRAVENOUS | Status: DC
  Administered 2023-10-03 – 2023-10-04 (×3): 40 mg via INTRAVENOUS
  Filled 2023-10-03 (×3): qty 10

## 2023-10-03 MED ORDER — PROPOFOL 10 MG/ML IV BOLUS
INTRAVENOUS | Status: DC | PRN
  Administered 2023-10-03: 40 mg via INTRAVENOUS
  Administered 2023-10-03: 30 mg via INTRAVENOUS

## 2023-10-03 MED ORDER — FUROSEMIDE 20 MG PO TABS: 20.0000 mg | ORAL_TABLET | Freq: Every morning | ORAL | Status: DC

## 2023-10-03 MED ORDER — INSULIN ASPART 100 UNIT/ML IJ SOLN: 0.0000 [IU] | Freq: Three times a day (TID) | INTRAMUSCULAR | Status: DC

## 2023-10-03 MED ORDER — LIDOCAINE 2% (20 MG/ML) 5 ML SYRINGE
INTRAMUSCULAR | Status: DC | PRN
  Administered 2023-10-03: 40 mg via INTRAVENOUS

## 2023-10-03 NOTE — Progress Notes (Signed)
 SLP Cancellation Note  Patient Details Name: Traci Clark MRN: [id number] DOB: April 17, 1931   Cancelled treatment:       Reason Eval/Treat Not Completed: Other (comment). Defer BSE at this time per MD - Pt is currently NPO pending EGD. Our service will continue to follow and continue efforts when Pt is medically appropriate, thank you,  Willem Klingensmith H. Vergil Glasser, CCC-SLP Speech Language Pathologist    Florina Husbands 10/03/2023, 11:24 AM

## 2023-10-03 NOTE — Progress Notes (Signed)
 Transition of Care Department [facility]) has reviewed patient and no other TOC needs have been identified at this time. We will continue to monitor patient advancement through interdisciplinary progression rounds. If new patient transition needs arise, please place a TOC consult.   10/03/23 0802  TOC Brief Assessment  Insurance and Status Reviewed  Patient has primary care physician Yes  Home environment has been reviewed Lives with grandson.  Prior level of function: Grandson or aide assists as needed. Aide M-F 9-3.  Prior/Current Home Services No current home services  Social Drivers of Health Review SDOH reviewed no interventions necessary  Readmission risk has been reviewed Yes  Transition of care needs no transition of care needs at this time

## 2023-10-03 NOTE — Anesthesia Postprocedure Evaluation (Signed)
 Anesthesia Post Note  Patient: Traci Clark  Procedure(s) Performed: EGD (ESOPHAGOGASTRODUODENOSCOPY) DILATION, ESOPHAGUS  Patient location during evaluation: PACU Anesthesia Type: General Level of consciousness: awake and alert Pain management: pain level controlled Vital Signs Assessment: post-procedure vital signs reviewed and stable Respiratory status: spontaneous breathing, nonlabored ventilation, respiratory function stable and patient connected to nasal cannula oxygen  Cardiovascular status: blood pressure returned to baseline and stable Postop Assessment: no apparent nausea or vomiting Anesthetic complications: no   No notable events documented.   Last Vitals:  Vitals:   10/03/23 1557 10/03/23 1713  BP: (!) 180/83 (!) 145/71  Pulse: 63 64  Resp: 18 (!) 21  Temp: 36.7 C 37.1 C  SpO2: 100% 100%    Last Pain:  Vitals:   10/03/23 1713  TempSrc:   PainSc: 0-No pain                 Coretha Dew

## 2023-10-03 NOTE — Hospital Course (Addendum)
 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 The patient has had numerous presyncope and syncopal type events even since her pacemaker placement.  She describes most of the episodes with changes in position, particular standing up.  However she does have episodes while sitting that only lasts a few seconds.  She denies biting her tongue or incontinence. Since she began developing dysphagia and vomiting, her episodes of syncope have been more frequent, usually associated with her emesis episodes. She denies any fevers, chills, chest pain, shortness of breath, palpitations. She denies any nausea, vomiting, diarrhea.  She has been having some dark stools for " many months".  She denies any hematochezia.  She has some abdominal cramping.  There is no dysuria or hematuria.  She denies focal extremity, dysesthesias or dysarthria. In the ED, the patient was afebrile and hemodynamically stable with oxygen  saturation 97% room air.  WBC 8.3, hemoglobin 12.5, platelets 180.  Sodium 136, potassium 3.9, bicarbonate 26, serum creatinine 1.11. GI and cardiology were consulted to assist with management.

## 2023-10-03 NOTE — Progress Notes (Signed)
 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 The patient has had numerous presyncope and syncopal type events even since her pacemaker placement.  She describes most of the episodes with changes in position, particular standing up.  However she does have episodes while sitting that only lasts a few seconds.  She denies biting her tongue or incontinence. Since she began developing dysphagia and vomiting, her episodes of syncope have been more frequent, usually associated with her emesis episodes. She denies any fevers, chills, chest pain, shortness of breath, palpitations. She denies any nausea, vomiting, diarrhea.  She has been having some dark stools for " many months".  She denies any hematochezia.  She has some abdominal cramping.  There is no dysuria or hematuria. In the ED, the patient was afebrile and hemodynamically stable with oxygen  saturation 97% room air.  WBC 8.3, hemoglobin 12.5, platelets 180.  Sodium 136, potassium 3.9, bicarbonate 26, serum creatinine 1.11. GI and cardiology were consulted to assist with management.   Assessment/Plan: Intractable vomiting/dysphagia - Appreciate GI consult - Planning for endoscopy once cleared from a cardiac standpoint - Remain n.p.o. for now - Continue pantoprazole   Syncope -I do not feel patient's symptoms are related to stroke - Primarily related to vagal phenomena with history of orthostasis and emesis -No focal neurologic deficits on exam -Certainly, patient could have dysrhythmia - Echocardiogram - Cardiology consult - Check orthostatics - 10/02/2023--CTA H&N--no hemodynamically significant  stenosis in the carotids; occlusion of P3 of right PCA, severe stenosis right PCA P1 P2  CKD stage IIIb - Baseline creatinine 1.1-1.4  Diabetes mellitus type 2, controlled - 04/13/2022 hemoglobin A1c 7.4 - NovoLog  sliding scale - Patient is on Basaglar  20 units at bedtime at home - Repeat A1c  GERD - Continue pantoprazole   History of pulm embolus - Patient saw Dr. Orvis Blare on 06/23/23 - Apixaban  was discontinued at that time as the patient has taken > 1 yr apixaban   Melena - GI consulted - Plan for endoscopy - Hemoglobin stable         Family Communication:   no Family at bedside  Consultants:  GI, cards  Code Status:  FULL   DVT Prophylaxis:  SCDs  Procedures: As Listed in Progress Note Above  Antibiotics: None       Subjective: She denies any chest pain, shortness breath, vomiting, diarrhea.  She has abdominal cramping.  She denies any headache or focal extremity weakness  Objective: Vitals:   10/03/23 0000 10/03/23 0031 10/03/23 0419 10/03/23 0741  BP: (!) 121/59 (!) 151/78 135/69 (!) 144/68  Pulse: 63 66 61 60  Resp: 16 16 15 18   Temp:  98.1 F (36.7 C) 98.2 F (36.8 C) 99.4 F (37.4 C)  TempSrc:  Oral Oral Oral  SpO2: 95% 95% 97% 96%  Weight:  71.7 kg    Height:  5\' 6"  (1.676 m)      Intake/Output Summary (Last 24 hours) at 10/03/2023 0801 Last data filed at 10/03/2023 0544 Gross per 24 hour  Intake 818.75 ml  Output --  Net 818.75 ml   Weight change:  Exam:  General:  Pt is alert, follows commands appropriately, not  in acute distress HEENT: No icterus, No thrush, No neck mass, [place]/AT Cardiovascular: RRR, S1/S2, no rubs, no gallops Respiratory: CTA bilaterally, no wheezing, no crackles, no rhonchi Abdomen: Soft/+BS, non tender, non distended, no guarding Extremities: No edema, No lymphangitis, No petechiae, No rashes, no synovitis Neuro:  CN II-XII intact, strength 4/5 in RUE, RLE, strength 4/5 LUE, LLE; sensation intact bilateral; no  dysmetria; babinski equivocal    Data Reviewed: I have personally reviewed following labs and imaging studies Basic Metabolic Panel: Recent Labs  Lab 10/02/23 1526 10/02/23 1534 10/03/23 0416  NA 133* 137 136  K 4.0 4.1 3.9  CL 99 100 103  CO2 26  --  26  GLUCOSE 131* 131* 108*  BUN 12 11 11   CREATININE 1.22* 1.20* 1.11*  CALCIUM  9.5  --  9.1  MG  --   --  1.9  PHOS  --   --  3.1   Liver Function Tests: Recent Labs  Lab 10/02/23 1526 10/03/23 0416  AST 23 21  ALT 13 12  ALKPHOS 128* 110  BILITOT 1.2 1.7*  PROT 6.8 6.0*  ALBUMIN 3.4* 3.0*   Recent Labs  Lab 10/02/23 1526  LIPASE 37   No results for input(s): "AMMONIA" in the last 168 hours. Coagulation Profile: No results for input(s): "INR", "PROTIME" in the last 168 hours. CBC: Recent Labs  Lab 10/02/23 1526 10/02/23 1534 10/03/23 0416  WBC 8.3  --  7.1  NEUTROABS 4.1  --   --   HGB 12.5 12.9 11.8*  HCT 37.3 38.0 35.1*  MCV 86.9  --  85.8  PLT 188  --  161   Cardiac Enzymes: No results for input(s): "CKTOTAL", "CKMB", "CKMBINDEX", "TROPONINI" in the last 168 hours. BNP: Invalid input(s): "POCBNP" CBG: Recent Labs  Lab 10/03/23 0041 10/03/23 0415 10/03/23 0744  GLUCAP 113* 111* 128*   HbA1C: No results for input(s): "HGBA1C" in the last 72 hours. Urine analysis:    Component Value Date/Time   COLORURINE STRAW (A) 05/29/2022 1525   APPEARANCEUR CLEAR 05/29/2022 1525   LABSPEC 1.008 05/29/2022 1525   PHURINE 6.0 05/29/2022 1525   GLUCOSEU NEGATIVE 05/29/2022 1525   HGBUR NEGATIVE 05/29/2022 1525   BILIRUBINUR NEGATIVE 05/29/2022 1525   KETONESUR NEGATIVE 05/29/2022 1525   PROTEINUR NEGATIVE 05/29/2022 1525   UROBILINOGEN 0.2 06/27/2014 1044   NITRITE NEGATIVE 05/29/2022 1525   LEUKOCYTESUR NEGATIVE 05/29/2022 1525   Sepsis Labs: @LABRCNTIP (procalcitonin:4,lacticidven:4) )No results found for this or any previous visit (from the past 240 hours).   Scheduled Meds:  feeding  supplement  237 mL Oral BID BM   furosemide  20 mg Oral q AM   insulin  aspart  0-9 Units Subcutaneous Q4H   pantoprazole   40 mg Oral Daily   Continuous Infusions:  Procedures/Studies: CT Angio Head Neck W WO CM Result Date: 10/02/2023 CLINICAL DATA:  Syncope EXAM: CT ANGIOGRAPHY HEAD AND NECK WITH AND WITHOUT CONTRAST TECHNIQUE: Multidetector CT imaging of the head and neck was performed using the standard protocol during bolus administration of intravenous contrast. Multiplanar CT image reconstructions and MIPs were obtained to evaluate the vascular anatomy. Carotid stenosis measurements (when applicable) are obtained utilizing NASCET criteria, using the distal internal carotid diameter as the denominator. RADIATION DOSE REDUCTION: This exam was performed according to the departmental dose-optimization program which includes automated exposure control, adjustment of the mA and/or kV according to patient size and/or use of iterative reconstruction technique. CONTRAST:  60mL OMNIPAQUE  IOHEXOL  350 MG/ML SOLN COMPARISON:  None  Available. FINDINGS: CT HEAD FINDINGS Brain: There is no mass, hemorrhage or extra-axial collection. The size and configuration of the ventricles and extra-axial CSF spaces are normal. There is hypoattenuation of the white matter, most commonly indicating chronic small vessel disease. 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 Vascular: No hyperdense vessel or unexpected vascular calcification. Skull: The visualized skull base, calvarium and extracranial soft tissues are normal. Sinuses/Orbits: No fluid levels or advanced mucosal thickening of the visualized paranasal sinuses. No mastoid or middle ear effusion. Normal orbits. CTA NECK FINDINGS Skeleton: No acute abnormality or high grade bony spinal canal stenosis. Other neck: Normal pharynx, larynx and major salivary glands. No cervical lymphadenopathy. Unremarkable thyroid  gland. Upper chest: No pneumothorax or pleural effusion. No  nodules or masses. Aortic arch: There is no calcific atherosclerosis of the aortic arch. Conventional 3 vessel aortic branching pattern. RIGHT carotid system: No dissection, occlusion or aneurysm. Mild atherosclerotic calcification at the carotid bifurcation without hemodynamically significant stenosis. LEFT carotid system: Normal without aneurysm, dissection or stenosis. Vertebral arteries: Left dominant configuration. Mild multifocal atherosclerosis of the left vertebral artery V2 segment without hemodynamically significant stenosis. The left vertebral artery has a tortuous course with multifocal narrowing and dilatation. Mild irregularity of the right vertebral artery without dissection or hemodynamically significant stenosis. CTA HEAD FINDINGS POSTERIOR CIRCULATION: Mild atherosclerotic calcification of the vertebral artery V4 segments. No proximal occlusion of the anterior or inferior cerebellar arteries. Basilar artery is normal. Superior cerebellar arteries are normal. 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 Severe stenosis of the left PCA P1 and P2 segments. There is occlusion of the left P3 segment. ANTERIOR CIRCULATION: Atherosclerotic calcification of the internal carotid arteries at the skull base without hemodynamically significant stenosis. Anterior cerebral arteries are normal. Middle cerebral arteries are normal. Venous sinuses: As permitted by contrast timing, patent. Anatomic variants: None Review of the MIP images confirms the above findings. IMPRESSION: 1. 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 2. Severe stenosis of the left PCA P1 and P2 segments with occlusion of the left P3 segment. 3. Tortuous courses of the vertebral arteries with multifocal narrowing and dilatation, which may be due to fibromuscular dysplasia. 4. No hemodynamically significant stenosis of the carotid arteries. Electronically Signed   By: Juanetta Nordmann M.D.   On: 10/02/2023 22:46    Demaris Fillers, DO  [facility]  If 7PM-7AM, please contact night-coverage [email or web address] Password [email or web address] 10/03/2023, 8:01 AM   LOS: 0 days

## 2023-10-03 NOTE — Progress Notes (Signed)
 Mobility Specialist Progress Note:    10/03/23 0945  Mobility  Activity Ambulated with assistance to bathroom  Level of Assistance Standby assist, set-up cues, supervision of patient - no hands on  Assistive Device Front wheel walker  Distance Ambulated (ft) 7 ft  Range of Motion/Exercises Active;All extremities  Activity Response Tolerated well  Mobility Referral Yes  Mobility visit 1 Mobility  Mobility Specialist Start Time (ACUTE ONLY) 0945  Mobility Specialist Stop Time (ACUTE ONLY) 1000  Mobility Specialist Time Calculation (min) (ACUTE ONLY) 15 min   Pt received in chair, requesting assistance to bathroom. Required SBA to stand and ambulate with RW. Tolerated well, asx throughout. RN in room, all needs met.  Jabier Deese Mobility Specialist Please contact via Special educational needs teacher or  Rehab office at [phone]

## 2023-10-03 NOTE — Interval H&P Note (Signed)
 History and Physical Interval Note:  10/03/2023 5:40 PM  Traci Clark  has presented today for surgery, with the diagnosis of dysphagia, esophagitis.  The various methods of treatment have been discussed with the patient and family. After consideration of risks, benefits and other options for treatment, the patient has consented to  Procedure(s): EGD (ESOPHAGOGASTRODUODENOSCOPY) (N/A) DILATION, ESOPHAGUS (N/A) as a surgical intervention.  The patient's history has been reviewed, patient examined, no change in status, stable for surgery.  I have reviewed the patient's chart and labs.  Questions were answered to the patient's satisfaction.     Traci Clark    patient seen in short stay.  No change.  See my attestation to our consultation note today.

## 2023-10-03 NOTE — Anesthesia Preprocedure Evaluation (Signed)
 Anesthesia Evaluation  Patient identified by MRN, date of birth, ID band Patient awake    Reviewed: Allergy & Precautions, H&P , NPO status , Patient's Chart, lab work & pertinent test results, reviewed documented beta blocker date and time   History of Anesthesia Complications (+) PONV and history of anesthetic complications  Airway Mallampati: II  TM Distance: >3 FB Neck ROM: full    Dental no notable dental hx.    Pulmonary neg pulmonary ROS   Pulmonary exam normal breath sounds clear to auscultation       Cardiovascular Exercise Tolerance: Good hypertension, + Past MI  negative cardio ROS + dysrhythmias + pacemaker  Rhythm:regular Rate:Normal     Neuro/Psych Seizures -,   Anxiety     negative neurological ROS  negative psych ROS   GI/Hepatic negative GI ROS, Neg liver ROS, hiatal hernia,GERD  ,,  Endo/Other  negative endocrine ROSdiabetes    Renal/GU Renal diseasenegative Renal ROS  negative genitourinary   Musculoskeletal   Abdominal   Peds  Hematology negative hematology ROS (+)   Anesthesia Other Findings   Reproductive/Obstetrics negative OB ROS                             Anesthesia Physical Anesthesia Plan  ASA: 4 and emergent  Anesthesia Plan: General   Post-op Pain Management:    Induction:   PONV Risk Score and Plan: Propofol  infusion  Airway Management Planned:   Additional Equipment:   Intra-op Plan:   Post-operative Plan:   Informed Consent: I have reviewed the patients History and Physical, chart, labs and discussed the procedure including the risks, benefits and alternatives for the proposed anesthesia with the patient or authorized representative who has indicated his/her understanding and acceptance.     Dental Advisory Given  Plan Discussed with: CRNA  Anesthesia Plan Comments:        Anesthesia Quick Evaluation

## 2023-10-03 NOTE — Consult Note (Addendum)
 Gastroenterology Consult   Referring Provider: No ref. provider found Primary Care Physician:  Jolynn Needy, [place] Primary Gastroenterologist: Urban Garden, MD   Patient ID: Traci Clark; [id number]; November 24, 1930   Admit date: 10/02/2023  LOS: 0 days   Date of Consultation: 10/03/2023  Reason for Consultation:  dysphagia and ?esophagitis  History of Present Illness   Traci Clark is a 89 y.o. year old female with history of type 2 diabetes, HTN, Candida esophagitis with stricture, CKD stage III, PE (completed 12 months anticoagulation), Mobitz 2 AV block s/p PPM who presented to the ED yesterday with intractable nausea, vomiting, and dysphagia after feel like pill has been stuck for a few days.  She also reported numerous presyncope/syncopal events in the past, prior to pacemaker placement and post placement which tends to occur more often with position changes.  Also has been seen outpatient for dark stools which she again reported in the ED.  Given reports of vomiting and potential esophageal impaction of pill, GI consulted.  ED Course: VSS Hgb 12.5, plts 180, NA 136, K 3.9, Cr 1.11 CTA head/neck: Occlusion of the proximal P3 segment of the right PCA. Severe stenosis of the left PCA P1 and P2 segments with occlusion of the left P3 segment. Tortuous courses of the vertebral arteries with multifocal narrowing and dilatation, which may be due to fibromuscular dysplasia. No hemodynamically significant stenosis of the carotid arteries.  Consult:  Daughter at bedside and helps add some history.  Patient states that when she began to take her pills on Tuesday after taking her antibiotic for UTI she felt like it was stuck.  She was scared to take any other additional of her medications therefore she did not take any of her diuretics or other medications.  She usually takes her medications 1 by one and generally does not have a problem.  No overt dysphagia prior to this but  has had lack of appetite and reports some weight loss prior to all of this.  Since the pill felt like it was stuck on Tuesday she has been able to swallow liquids without regurgitation up until yesterday however anything softer solid that she tries to put down has come back up.  Able to tolerate saliva okay.  She reports some concern about weight loss, she states about a 15 pound potential weight loss over the last several months but even over the last few days-week she has noticed by the way that her body feels and looks that she has lost little bit more weight.  Daughter reports she has had a lack of appetite for some time.  She reports given the pill got stuck on Tuesday she has been experiencing more syncopal episodes and that also is another reason why she has not wanted to attempt to take any other additional medications.   CT A/P Aug 2025: cardiomegaly, bilateral renal cysts, fatty atrophy of pancreas, no lesions or inflammatory changes  Last Colonoscopy:07/21/20 - presence of a patent anastomosis in the ascending colon with normal mucosa, -3 polyps between 2 to 5 mm were removed from the ascending colon and descending colon.   Last Endoscopy: 03/2022 - White nummular lesions in esophageal mucosa. Biopsied. - A few gastric polyps. Biopsied normal mucosa for H. pylori testing. - Normal examined duodenum  - Path: severe acute candida esophagitis, reactive gastropathy, neg H pylori  - Treated with 3 weeks of antifungal  Past Medical History:  Diagnosis Date   Anxiety    Arthritis  Chronic constipation    CKD (chronic kidney disease), stage III (HCC)    Colon cancer (HCC)    colon   Complication of anesthesia    Diabetes mellitus without complication (HCC)    DVT (deep vein thrombosis) in pregnancy    Dysphagia    GERD (gastroesophageal reflux disease)    Glaucoma    Gout    H/O hiatal hernia    History of kidney stones    Iliac artery aneurysm, bilateral (HCC)    MI (myocardial  infarction) (HCC) 1993   PONV (postoperative nausea and vomiting)    Restless leg syndrome    Seizure (HCC)    withdrawal from xanax    Vertigo     Past Surgical History:  Procedure Laterality Date   BIOPSY  07/21/2020   Procedure: BIOPSY;  Surgeon: Urban Garden, MD;  [facility];  Service: Gastroenterology;;   BIOPSY  03/26/2022   Procedure: BIOPSY;  Surgeon: Urban Garden, MD;  [facility];  Service: Gastroenterology;;   CHOLECYSTECTOMY     COLECTOMY  2003   COLONOSCOPY N/A 07/29/2012   Procedure: COLONOSCOPY;  Surgeon: Ruby Corporal, MD;  [facility];  Service: Endoscopy;  Laterality: N/A;  100   COLONOSCOPY WITH PROPOFOL  N/A 12/13/2016   Procedure: COLONOSCOPY WITH PROPOFOL ;  Surgeon: Ruby Corporal, MD;  [facility];  Service: Endoscopy;  Laterality: N/A;  8:30   COLONOSCOPY WITH PROPOFOL  N/A 07/21/2020   Procedure: COLONOSCOPY WITH PROPOFOL ;  Surgeon: Urban Garden, MD;  [facility];  Service: Gastroenterology;  Laterality: N/A;  AM   ESOPHAGEAL DILATION  11/03/2015   Procedure: ESOPHAGEAL DILATION;  Surgeon: Ruby Corporal, MD;  [facility];  Service: Endoscopy;;   ESOPHAGEAL DILATION N/A 09/26/2017   Procedure: ESOPHAGEAL DILATION;  Surgeon: Ruby Corporal, MD;  [facility];  Service: Endoscopy;  Laterality: N/A;   ESOPHAGOGASTRODUODENOSCOPY N/A 03/25/2014   Procedure: ESOPHAGOGASTRODUODENOSCOPY (EGD);  Surgeon: Ruby Corporal, MD;  [facility];  Service: Endoscopy;  Laterality: N/A;  1055   ESOPHAGOGASTRODUODENOSCOPY N/A 11/03/2015   Procedure: ESOPHAGOGASTRODUODENOSCOPY (EGD);  Surgeon: Ruby Corporal, MD;  [facility];  Service: Endoscopy;  Laterality: N/A;  1:55 - moved to 11:15 - Ann to notify - moved to 12:45 - pt knows to arrive at 11:45   ESOPHAGOGASTRODUODENOSCOPY N/A 09/26/2017   Procedure: ESOPHAGOGASTRODUODENOSCOPY (EGD);   Surgeon: Ruby Corporal, MD;  [facility];  Service: Endoscopy;  Laterality: N/A;   ESOPHAGOGASTRODUODENOSCOPY (EGD) WITH PROPOFOL  N/A 07/21/2020   Procedure: ESOPHAGOGASTRODUODENOSCOPY (EGD) WITH PROPOFOL ;  Surgeon: Urban Garden, MD;  [facility];  Service: Gastroenterology;  Laterality: N/A;   ESOPHAGOGASTRODUODENOSCOPY (EGD) WITH PROPOFOL  N/A 03/26/2022   Procedure: ESOPHAGOGASTRODUODENOSCOPY (EGD) WITH PROPOFOL ;  Surgeon: Urban Garden, MD;  [facility];  Service: Gastroenterology;  Laterality: N/A;  1215 ASA 3   MALONEY DILATION N/A 03/25/2014   Procedure: MALONEY DILATION;  Surgeon: Ruby Corporal, MD;  [facility];  Service: Endoscopy;  Laterality: N/A;   PACEMAKER IMPLANT N/A 08/27/2022   Procedure: PACEMAKER IMPLANT;  Surgeon: Efraim Grange, MD;  [facility];  Service: Cardiovascular;  Laterality: N/A;   POLYPECTOMY  12/13/2016   Procedure: POLYPECTOMY;  Surgeon: Ruby Corporal, MD;  [facility];  Service: Endoscopy;;  polyp   POLYPECTOMY  07/21/2020   Procedure: POLYPECTOMY;  Surgeon: Umberto Ganong,  Bearl Limes, MD;  [facility];  Service: Gastroenterology;;   TOTAL HIP ARTHROPLASTY Left    TUBAL LIGATION     VEIN SURGERY Bilateral    stripped vein due to DVT    Prior to Admission medications   Medication Sig Start Date End Date Taking? Authorizing Provider  acetaminophen  (TYLENOL ) 500 MG tablet Take 1,000 mg by mouth every 6 (six) hours as needed (pain.). Gelcaps   Yes [provider]  albuterol  (VENTOLIN  HFA) 108 (90 Base) MCG/ACT inhaler Inhale 1-2 puffs into the lungs every 6 (six) hours as needed for shortness of breath or wheezing.   Yes [provider]  allopurinol  (ZYLOPRIM ) 100 MG tablet Take 100 mg by mouth in the morning.   Yes [provider]  ALPRAZolam  (XANAX ) 0.5 MG tablet Take 0.5 mg by mouth 3 (three) times daily as needed for  anxiety.   Yes [provider]  ciprofloxacin  (CIPRO ) 500 MG tablet Take 500 mg by mouth 2 (two) times daily. 09/23/23  Yes [provider]  dexlansoprazole (DEXILANT) 60 MG capsule Take 60 mg by mouth daily before breakfast.   Yes [provider]  fluticasone (FLONASE) 50 MCG/ACT nasal spray Place 2 sprays into both nostrils daily as needed (allergies (cough/sneezing)). 09/24/20  Yes [provider]  furosemide (LASIX) 20 MG tablet Take 20 mg by mouth in the morning.   Yes [provider]  insulin  aspart (NOVOLOG  FLEXPEN RELION) 100 UNIT/ML FlexPen Inject 4-10 Units into the skin 3 (three) times daily with meals. Per sliding scale >150 = 4 units 200-250 = 6 units 251-350 = 8 units >351 = 10 units   Yes [provider]  Insulin  Glargine (BASAGLAR  KWIKPEN) 100 UNIT/ML Inject 20 Units into the skin at bedtime. 06/15/23  Yes [provider]  latanoprost  (XALATAN ) 0.005 % ophthalmic solution Place 1 drop into both eyes at bedtime. 09/21/20  Yes [provider]  LUTEIN PO Take 40 mg by mouth daily.   Yes [provider]  meclizine  (ANTIVERT ) 25 MG tablet Take 1 tablet (25 mg total) by mouth 3 (three) times daily as needed for dizziness. 07/30/21  Yes Carlan, Chelsea L, NP  Menthol , Topical Analgesic, (ICY HOT BACK EXTRA STRENGTH) 5 % PTCH Place 1 patch onto the skin daily as needed (pain.).   Yes [provider]  nystatin  cream (MYCOSTATIN ) Apply 1 Application topically 2 (two) times daily. 09/28/23  Yes [provider]  ondansetron  (ZOFRAN ) 4 MG tablet Take 4 mg by mouth every 8 (eight) hours as needed for nausea or vomiting. Dissolving tablet 08/21/23  Yes [provider]  phenazopyridine (PYRIDIUM) 200 MG tablet Take 200 mg by mouth 3 (three) times daily as needed for pain. 09/23/23  Yes [provider]  polyethylene glycol (MIRALAX  / GLYCOLAX ) packet Take 34 g by mouth in the morning.   Yes  [provider]  potassium chloride (MICRO-K) 10 MEQ CR capsule Take 10 mEq by mouth in the morning.   Yes [provider]  tacrolimus (PROTOPIC) 0.1 % ointment Apply 1 Application topically 2 (two) times daily. 06/23/23  Yes [provider]  traMADol  (ULTRAM ) 50 MG tablet Take 1 tablet (50 mg total) by mouth every 6 (six) hours as needed for severe pain. 04/17/22  Yes Shah, Pratik D, DO  triamcinolone ointment (KENALOG) 0.1 % Apply 1 Application topically daily. 08/19/22  Yes [provider]  clobetasol ointment (TEMOVATE) 0.05 % Apply 1 Application topically 2 (two) times daily  as needed (irritation.). 07/22/22   [provider]    Current Facility-Administered Medications  Medication Dose Route Frequency Provider Last Rate Last Admin   feeding supplement (ENSURE ENLIVE / ENSURE PLUS) liquid 237 mL  237 mL Oral BID BM Adefeso, Oladapo, DO       insulin  aspart (novoLOG ) injection 0-9 Units  0-9 Units Subcutaneous Q4H Adefeso, Oladapo, DO       lactated ringers  infusion   Intravenous Continuous Tat, Myrtie Atkinson, MD 50 mL/hr at 10/03/23 0952 New Bag at 10/03/23 4098   ondansetron  (ZOFRAN ) tablet 4 mg  4 mg Oral Q6H PRN Adefeso, Oladapo, DO       Or   ondansetron  (ZOFRAN ) injection 4 mg  4 mg Intravenous Q6H PRN Adefeso, Oladapo, DO       pantoprazole  (PROTONIX ) EC tablet 40 mg  40 mg Oral Daily Adefeso, Oladapo, DO        Allergies as of 10/02/2023 - Review Complete 10/02/2023  Allergen Reaction Noted   Amoxil [amoxicillin] Anaphylaxis and Rash 10/31/2015   Macrobid [nitrofurantoin monohyd macro] Anaphylaxis, Rash, and Other (See Comments) 10/31/2015   Codeine Nausea And Vomiting and Other (See Comments) 07/14/2012   Darvon [propoxyphene] Nausea And Vomiting and Other (See Comments) 06/27/2014   Phenergan  [promethazine ] Other (See Comments) 02/28/2018   Pravachol [pravastatin] Other (See Comments) 09/22/2015   Rocephin  [ceftriaxone ] Other (See Comments)  11/13/2020   Sulfa antibiotics Other (See Comments) 05/29/2022   Hydrocodone-acetaminophen  Nausea And Vomiting and Other (See Comments) 02/04/2012   Mirtazapine Other (See Comments) 03/15/2022   Neurontin  [gabapentin ] Nausea Only 12/21/2020    Family History  Problem Relation Age of Onset   Heart disease Father     Social History   Socioeconomic History   Marital status: Married    Spouse name: Not on file   Number of children: Not on file   Years of education: Not on file   Highest education level: Not on file  Occupational History   Not on file  Tobacco Use   Smoking status: Never    Passive exposure: Never   Smokeless tobacco: Never  Vaping Use   Vaping status: Never Used  Substance and Sexual Activity   Alcohol use: No    Alcohol/week: 0.0 standard drinks of alcohol   Drug use: No   Sexual activity: Not Currently    Birth control/protection: Surgical  Other Topics Concern   Not on file  Social History Narrative   Not on file   Social Drivers of Health   Financial Resource Strain: Not on file  Food Insecurity: No Food Insecurity (10/03/2023)   Hunger Vital Sign    Worried About Running Out of Food in the Last Year: Never true    Ran Out of Food in the Last Year: Never true  Transportation Needs: No Transportation Needs (10/03/2023)   PRAPARE - Administrator, Civil Service (Medical): No    Lack of Transportation (Non-Medical): No  Physical Activity: Not on file  Stress: Not on file  Social Connections: Moderately Integrated (10/03/2023)   Social Connection and Isolation Panel [NHANES]    Frequency of Communication with Friends and Family: More than three times a week    Frequency of Social Gatherings with Friends and Family: More than three times a week    Attends Religious Services: More than 4 times per year    Active Member of [employer] or Organizations: Yes    Attends Banker Meetings: More than 4 times per  year    Marital Status:  Widowed  Intimate Partner Violence: Not At Risk (10/03/2023)   Humiliation, Afraid, Rape, and Kick questionnaire    Fear of Current or Ex-Partner: No    Emotionally Abused: No    Physically Abused: No    Sexually Abused: No     Review of Systems   Gen: Denies any fever, chills, loss of appetite, change in weight or weight loss CV: Denies chest pain, heart palpitations, syncope, edema  Resp: Denies shortness of breath with rest, cough, wheezing, coughing up blood, and pleurisy. GI: Denies vomiting blood, jaundice, and fecal incontinence.   Denies dysphagia or odynophagia. GU : Denies urinary burning, blood in urine, urinary frequency, and urinary incontinence. MS: Denies joint pain, limitation of movement, swelling, cramps, and atrophy.  Derm: Denies rash, itching, dry skin, hives. Psych: Denies depression, anxiety, memory loss, hallucinations, and confusion. Heme: Denies bruising or bleeding Neuro:  Denies any headaches, dizziness, paresthesias, shaking  Physical Exam   Vital Signs in last 24 hours: Temp:  [96.8 F (36 C)-99.4 F (37.4 C)] 99.4 F (37.4 C) (05/16 0741) Pulse Rate:  [60-70] 60 (05/16 0741) Resp:  [15-18] 18 (05/16 0741) BP: (121-151)/(59-95) 144/68 (05/16 0741) SpO2:  [93 %-97 %] 96 % (05/16 0741) Weight:  [71.7 kg-72.3 kg] 71.7 kg (05/16 0031)    General:   Alert, pleasant and cooperative in NAD Head:  Normocephalic and atraumatic. Eyes:  Sclera clear, no icterus.   Conjunctiva pink. Ears:  Normal auditory acuity. Lungs:  Clear throughout to auscultation.   No wheezes, crackles, or rhonchi. No acute distress. Heart:  Regular rate and rhythm; paced beats. no murmurs, clicks, rubs,  or gallops. Abdomen:  Soft, nontender and nondistended. No masses, hepatosplenomegaly or hernias noted. Normal bowel sounds, without guarding, and without rebound.   Rectal: deferred Neurologic:  Alert and  oriented x4. Skin:  Intact without significant lesions or  rashes. Psych:  Alert and cooperative. Normal mood and affect.  Intake/Output from previous day: 05/15 0701 - 05/16 0700 In: 818.8 [I.V.:818.8] Out: -  Intake/Output this shift: No intake/output data recorded.   Labs/Studies   Recent Labs Recent Labs    10/02/23 1526 10/02/23 1534 10/03/23 0416  WBC 8.3  --  7.1  HGB 12.5 12.9 11.8*  HCT 37.3 38.0 35.1*  PLT 188  --  161   BMET Recent Labs    10/02/23 1526 10/02/23 1534 10/03/23 0416  NA 133* 137 136  K 4.0 4.1 3.9  CL 99 100 103  CO2 26  --  26  GLUCOSE 131* 131* 108*  BUN 12 11 11   CREATININE 1.22* 1.20* 1.11*  CALCIUM  9.5  --  9.1   LFT Recent Labs    10/02/23 1526 10/03/23 0416  PROT 6.8 6.0*  ALBUMIN 3.4* 3.0*  AST 23 21  ALT 13 12  ALKPHOS 128* 110  BILITOT 1.2 1.7*   PT/INR No results for input(s): "LABPROT", "INR" in the last 72 hours. Hepatitis Panel No results for input(s): "HEPBSAG", "HCVAB", "HEPAIGM", "HEPBIGM" in the last 72 hours. C-Diff No results for input(s): "CDIFFTOX" in the last 72 hours.  Radiology/Studies CT Angio Head Neck W WO CM Result Date: 10/02/2023 CLINICAL DATA:  Syncope EXAM: CT ANGIOGRAPHY HEAD AND NECK WITH AND WITHOUT CONTRAST TECHNIQUE: Multidetector CT imaging of the head and neck was performed using the standard protocol during bolus administration of intravenous contrast. Multiplanar CT image reconstructions and MIPs were obtained to evaluate the vascular anatomy. Carotid stenosis  measurements (when applicable) are obtained utilizing NASCET criteria, using the distal internal carotid diameter as the denominator. RADIATION DOSE REDUCTION: This exam was performed according to the departmental dose-optimization program which includes automated exposure control, adjustment of the mA and/or kV according to patient size and/or use of iterative reconstruction technique. CONTRAST:  60mL OMNIPAQUE  IOHEXOL  350 MG/ML SOLN COMPARISON:  None Available. FINDINGS: CT HEAD FINDINGS  Brain: There is no mass, hemorrhage or extra-axial collection. The size and configuration of the ventricles and extra-axial CSF spaces are normal. There is hypoattenuation of the white matter, most commonly indicating chronic small vessel disease. 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 Vascular: No hyperdense vessel or unexpected vascular calcification. Skull: The visualized skull base, calvarium and extracranial soft tissues are normal. Sinuses/Orbits: No fluid levels or advanced mucosal thickening of the visualized paranasal sinuses. No mastoid or middle ear effusion. Normal orbits. CTA NECK FINDINGS Skeleton: No acute abnormality or high grade bony spinal canal stenosis. Other neck: Normal pharynx, larynx and major salivary glands. No cervical lymphadenopathy. Unremarkable thyroid  gland. Upper chest: No pneumothorax or pleural effusion. No nodules or masses. Aortic arch: There is no calcific atherosclerosis of the aortic arch. Conventional 3 vessel aortic branching pattern. RIGHT carotid system: No dissection, occlusion or aneurysm. Mild atherosclerotic calcification at the carotid bifurcation without hemodynamically significant stenosis. LEFT carotid system: Normal without aneurysm, dissection or stenosis. Vertebral arteries: Left dominant configuration. Mild multifocal atherosclerosis of the left vertebral artery V2 segment without hemodynamically significant stenosis. The left vertebral artery has a tortuous course with multifocal narrowing and dilatation. Mild irregularity of the right vertebral artery without dissection or hemodynamically significant stenosis. CTA HEAD FINDINGS POSTERIOR CIRCULATION: Mild atherosclerotic calcification of the vertebral artery V4 segments. No proximal occlusion of the anterior or inferior cerebellar arteries. Basilar artery is normal. Superior cerebellar arteries are normal. 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 Severe stenosis of the left PCA P1 and P2  segments. There is occlusion of the left P3 segment. ANTERIOR CIRCULATION: Atherosclerotic calcification of the internal carotid arteries at the skull base without hemodynamically significant stenosis. Anterior cerebral arteries are normal. Middle cerebral arteries are normal. Venous sinuses: As permitted by contrast timing, patent. Anatomic variants: None Review of the MIP images confirms the above findings. IMPRESSION: 1. 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 2. Severe stenosis of the left PCA P1 and P2 segments with occlusion of the left P3 segment. 3. Tortuous courses of the vertebral arteries with multifocal narrowing and dilatation, which may be due to fibromuscular dysplasia. 4. No hemodynamically significant stenosis of the carotid arteries. Electronically Signed   By: Juanetta Nordmann M.D.   On: 10/02/2023 22:46   Assessment   Traci Clark is a 89 y.o. year old female with history of type 2 diabetes, HTN, Candida esophagitis with stricture, CKD stage III, PE (completed 12 months anticoagulation), Mobitz 2 AV block s/p PPM who presented to the ED yesterday with reports of presyncope/syncopal episodes for many months and intractable nausea, vomiting, and dysphagia after feeling like pill has been stuck for a few days.  GI consulted for further evaluation and management.  Dysphagia, ?esophagitis:  - has history of candida esophagitis on EGD in 2023 - most recently having sensation of pill stuck in her throat (abx) which has caused some vomiting and regurgitation of solids, has been on primarily liquid diet since Tuesday.  - treat with ppi daily - will plan for EGD here at [facility] if EKG  and echo stable, if any severe stenosis identified she will need procedures at [facility] - will keep NPO for now - Candida esophagitis remains high on differential list given her history, given age and recent weight loss and lack of appetite - malignancy is also on differential.   I have discussed the  risks, alternatives, benefits with regards to but not limited to the risk of reaction to medication, bleeding, infection, perforation and the patient is agreeable to proceed. Written consent to be obtained.  Syncope: - Chronic with intermittent acute increase in frequency (2-3 times weekly) - current dual chamber pacemaker in place given Mobitz 2 AV block history -cards following, suspects vasovagal/orthostatic hypotension contributing -plans to perform Echo today and possible interrogation of pacemaker -neurological workup negative thus far and no exam deficits - decreased p.o intake also likely contributing, cards has d/c'd lasix.   Anemia: Hgb down to 11.58 today fro, 12.9 on admission, likely secondary to hemodilution given IV fluid administration and bolus.   Plan / Recommendations   PPI daily Npo until procedure candidacy determined Plan for EGD with possible dilation today if cardiac clearance for [facility] procedure given Awaiting cardiology clearance post echo  Addendum: Just received update from cardiology, Dr. Amanda Jungling who reviewed patient's echo who states that it looks fine and there is no cardiac findings that would delay procedure being performed today.  Updated anesthesia and Dr. Riley Cheadle have agreed to go ahead and proceed with a EGD today.    10/03/2023, 9:53 AM  Julian Obey, MSN, FNP-BC, AGACNP-BC [facility]

## 2023-10-03 NOTE — H&P (View-Only) (Signed)
 Gastroenterology Consult   Referring Provider: No ref. provider found Primary Care Physician:  Jolynn Needy, [place] Primary Gastroenterologist: Urban Garden, MD   Patient ID: Traci Clark; [id number]; November 24, 1930   Admit date: 10/02/2023  LOS: 0 days   Date of Consultation: 10/03/2023  Reason for Consultation:  dysphagia and ?esophagitis  History of Present Illness   Traci Clark is a 89 y.o. year old female with history of type 2 diabetes, HTN, Candida esophagitis with stricture, CKD stage III, PE (completed 12 months anticoagulation), Mobitz 2 AV block s/p PPM who presented to the ED yesterday with intractable nausea, vomiting, and dysphagia after feel like pill has been stuck for a few days.  She also reported numerous presyncope/syncopal events in the past, prior to pacemaker placement and post placement which tends to occur more often with position changes.  Also has been seen outpatient for dark stools which she again reported in the ED.  Given reports of vomiting and potential esophageal impaction of pill, GI consulted.  ED Course: VSS Hgb 12.5, plts 180, NA 136, K 3.9, Cr 1.11 CTA head/neck: Occlusion of the proximal P3 segment of the right PCA. Severe stenosis of the left PCA P1 and P2 segments with occlusion of the left P3 segment. Tortuous courses of the vertebral arteries with multifocal narrowing and dilatation, which may be due to fibromuscular dysplasia. No hemodynamically significant stenosis of the carotid arteries.  Consult:  Daughter at bedside and helps add some history.  Patient states that when she began to take her pills on Tuesday after taking her antibiotic for UTI she felt like it was stuck.  She was scared to take any other additional of her medications therefore she did not take any of her diuretics or other medications.  She usually takes her medications 1 by one and generally does not have a problem.  No overt dysphagia prior to this but  has had lack of appetite and reports some weight loss prior to all of this.  Since the pill felt like it was stuck on Tuesday she has been able to swallow liquids without regurgitation up until yesterday however anything softer solid that she tries to put down has come back up.  Able to tolerate saliva okay.  She reports some concern about weight loss, she states about a 15 pound potential weight loss over the last several months but even over the last few days-week she has noticed by the way that her body feels and looks that she has lost little bit more weight.  Daughter reports she has had a lack of appetite for some time.  She reports given the pill got stuck on Tuesday she has been experiencing more syncopal episodes and that also is another reason why she has not wanted to attempt to take any other additional medications.   CT A/P Aug 2025: cardiomegaly, bilateral renal cysts, fatty atrophy of pancreas, no lesions or inflammatory changes  Last Colonoscopy:07/21/20 - presence of a patent anastomosis in the ascending colon with normal mucosa, -3 polyps between 2 to 5 mm were removed from the ascending colon and descending colon.   Last Endoscopy: 03/2022 - White nummular lesions in esophageal mucosa. Biopsied. - A few gastric polyps. Biopsied normal mucosa for H. pylori testing. - Normal examined duodenum  - Path: severe acute candida esophagitis, reactive gastropathy, neg H pylori  - Treated with 3 weeks of antifungal  Past Medical History:  Diagnosis Date   Anxiety    Arthritis  Chronic constipation    CKD (chronic kidney disease), stage III (HCC)    Colon cancer (HCC)    colon   Complication of anesthesia    Diabetes mellitus without complication (HCC)    DVT (deep vein thrombosis) in pregnancy    Dysphagia    GERD (gastroesophageal reflux disease)    Glaucoma    Gout    H/O hiatal hernia    History of kidney stones    Iliac artery aneurysm, bilateral (HCC)    MI (myocardial  infarction) (HCC) 1993   PONV (postoperative nausea and vomiting)    Restless leg syndrome    Seizure (HCC)    withdrawal from xanax    Vertigo     Past Surgical History:  Procedure Laterality Date   BIOPSY  07/21/2020   Procedure: BIOPSY;  Surgeon: Urban Garden, MD;  [facility];  Service: Gastroenterology;;   BIOPSY  03/26/2022   Procedure: BIOPSY;  Surgeon: Urban Garden, MD;  [facility];  Service: Gastroenterology;;   CHOLECYSTECTOMY     COLECTOMY  2003   COLONOSCOPY N/A 07/29/2012   Procedure: COLONOSCOPY;  Surgeon: Ruby Corporal, MD;  [facility];  Service: Endoscopy;  Laterality: N/A;  100   COLONOSCOPY WITH PROPOFOL  N/A 12/13/2016   Procedure: COLONOSCOPY WITH PROPOFOL ;  Surgeon: Ruby Corporal, MD;  [facility];  Service: Endoscopy;  Laterality: N/A;  8:30   COLONOSCOPY WITH PROPOFOL  N/A 07/21/2020   Procedure: COLONOSCOPY WITH PROPOFOL ;  Surgeon: Urban Garden, MD;  [facility];  Service: Gastroenterology;  Laterality: N/A;  AM   ESOPHAGEAL DILATION  11/03/2015   Procedure: ESOPHAGEAL DILATION;  Surgeon: Ruby Corporal, MD;  [facility];  Service: Endoscopy;;   ESOPHAGEAL DILATION N/A 09/26/2017   Procedure: ESOPHAGEAL DILATION;  Surgeon: Ruby Corporal, MD;  [facility];  Service: Endoscopy;  Laterality: N/A;   ESOPHAGOGASTRODUODENOSCOPY N/A 03/25/2014   Procedure: ESOPHAGOGASTRODUODENOSCOPY (EGD);  Surgeon: Ruby Corporal, MD;  [facility];  Service: Endoscopy;  Laterality: N/A;  1055   ESOPHAGOGASTRODUODENOSCOPY N/A 11/03/2015   Procedure: ESOPHAGOGASTRODUODENOSCOPY (EGD);  Surgeon: Ruby Corporal, MD;  [facility];  Service: Endoscopy;  Laterality: N/A;  1:55 - moved to 11:15 - Ann to notify - moved to 12:45 - pt knows to arrive at 11:45   ESOPHAGOGASTRODUODENOSCOPY N/A 09/26/2017   Procedure: ESOPHAGOGASTRODUODENOSCOPY (EGD);   Surgeon: Ruby Corporal, MD;  [facility];  Service: Endoscopy;  Laterality: N/A;   ESOPHAGOGASTRODUODENOSCOPY (EGD) WITH PROPOFOL  N/A 07/21/2020   Procedure: ESOPHAGOGASTRODUODENOSCOPY (EGD) WITH PROPOFOL ;  Surgeon: Urban Garden, MD;  [facility];  Service: Gastroenterology;  Laterality: N/A;   ESOPHAGOGASTRODUODENOSCOPY (EGD) WITH PROPOFOL  N/A 03/26/2022   Procedure: ESOPHAGOGASTRODUODENOSCOPY (EGD) WITH PROPOFOL ;  Surgeon: Urban Garden, MD;  [facility];  Service: Gastroenterology;  Laterality: N/A;  1215 ASA 3   MALONEY DILATION N/A 03/25/2014   Procedure: MALONEY DILATION;  Surgeon: Ruby Corporal, MD;  [facility];  Service: Endoscopy;  Laterality: N/A;   PACEMAKER IMPLANT N/A 08/27/2022   Procedure: PACEMAKER IMPLANT;  Surgeon: Efraim Grange, MD;  [facility];  Service: Cardiovascular;  Laterality: N/A;   POLYPECTOMY  12/13/2016   Procedure: POLYPECTOMY;  Surgeon: Ruby Corporal, MD;  [facility];  Service: Endoscopy;;  polyp   POLYPECTOMY  07/21/2020   Procedure: POLYPECTOMY;  Surgeon: Umberto Ganong,  Bearl Limes, MD;  [facility];  Service: Gastroenterology;;   TOTAL HIP ARTHROPLASTY Left    TUBAL LIGATION     VEIN SURGERY Bilateral    stripped vein due to DVT    Prior to Admission medications   Medication Sig Start Date End Date Taking? Authorizing Provider  acetaminophen  (TYLENOL ) 500 MG tablet Take 1,000 mg by mouth every 6 (six) hours as needed (pain.). Gelcaps   Yes [provider]  albuterol  (VENTOLIN  HFA) 108 (90 Base) MCG/ACT inhaler Inhale 1-2 puffs into the lungs every 6 (six) hours as needed for shortness of breath or wheezing.   Yes [provider]  allopurinol  (ZYLOPRIM ) 100 MG tablet Take 100 mg by mouth in the morning.   Yes [provider]  ALPRAZolam  (XANAX ) 0.5 MG tablet Take 0.5 mg by mouth 3 (three) times daily as needed for  anxiety.   Yes [provider]  ciprofloxacin  (CIPRO ) 500 MG tablet Take 500 mg by mouth 2 (two) times daily. 09/23/23  Yes [provider]  dexlansoprazole (DEXILANT) 60 MG capsule Take 60 mg by mouth daily before breakfast.   Yes [provider]  fluticasone (FLONASE) 50 MCG/ACT nasal spray Place 2 sprays into both nostrils daily as needed (allergies (cough/sneezing)). 09/24/20  Yes [provider]  furosemide (LASIX) 20 MG tablet Take 20 mg by mouth in the morning.   Yes [provider]  insulin  aspart (NOVOLOG  FLEXPEN RELION) 100 UNIT/ML FlexPen Inject 4-10 Units into the skin 3 (three) times daily with meals. Per sliding scale >150 = 4 units 200-250 = 6 units 251-350 = 8 units >351 = 10 units   Yes [provider]  Insulin  Glargine (BASAGLAR  KWIKPEN) 100 UNIT/ML Inject 20 Units into the skin at bedtime. 06/15/23  Yes [provider]  latanoprost  (XALATAN ) 0.005 % ophthalmic solution Place 1 drop into both eyes at bedtime. 09/21/20  Yes [provider]  LUTEIN PO Take 40 mg by mouth daily.   Yes [provider]  meclizine  (ANTIVERT ) 25 MG tablet Take 1 tablet (25 mg total) by mouth 3 (three) times daily as needed for dizziness. 07/30/21  Yes Carlan, Chelsea L, NP  Menthol , Topical Analgesic, (ICY HOT BACK EXTRA STRENGTH) 5 % PTCH Place 1 patch onto the skin daily as needed (pain.).   Yes [provider]  nystatin  cream (MYCOSTATIN ) Apply 1 Application topically 2 (two) times daily. 09/28/23  Yes [provider]  ondansetron  (ZOFRAN ) 4 MG tablet Take 4 mg by mouth every 8 (eight) hours as needed for nausea or vomiting. Dissolving tablet 08/21/23  Yes [provider]  phenazopyridine (PYRIDIUM) 200 MG tablet Take 200 mg by mouth 3 (three) times daily as needed for pain. 09/23/23  Yes [provider]  polyethylene glycol (MIRALAX  / GLYCOLAX ) packet Take 34 g by mouth in the morning.   Yes  [provider]  potassium chloride (MICRO-K) 10 MEQ CR capsule Take 10 mEq by mouth in the morning.   Yes [provider]  tacrolimus (PROTOPIC) 0.1 % ointment Apply 1 Application topically 2 (two) times daily. 06/23/23  Yes [provider]  traMADol  (ULTRAM ) 50 MG tablet Take 1 tablet (50 mg total) by mouth every 6 (six) hours as needed for severe pain. 04/17/22  Yes Shah, Pratik D, DO  triamcinolone ointment (KENALOG) 0.1 % Apply 1 Application topically daily. 08/19/22  Yes [provider]  clobetasol ointment (TEMOVATE) 0.05 % Apply 1 Application topically 2 (two) times daily  as needed (irritation.). 07/22/22   [provider]    Current Facility-Administered Medications  Medication Dose Route Frequency Provider Last Rate Last Admin   feeding supplement (ENSURE ENLIVE / ENSURE PLUS) liquid 237 mL  237 mL Oral BID BM Adefeso, Oladapo, DO       insulin  aspart (novoLOG ) injection 0-9 Units  0-9 Units Subcutaneous Q4H Adefeso, Oladapo, DO       lactated ringers  infusion   Intravenous Continuous Tat, Myrtie Atkinson, MD 50 mL/hr at 10/03/23 0952 New Bag at 10/03/23 4098   ondansetron  (ZOFRAN ) tablet 4 mg  4 mg Oral Q6H PRN Adefeso, Oladapo, DO       Or   ondansetron  (ZOFRAN ) injection 4 mg  4 mg Intravenous Q6H PRN Adefeso, Oladapo, DO       pantoprazole  (PROTONIX ) EC tablet 40 mg  40 mg Oral Daily Adefeso, Oladapo, DO        Allergies as of 10/02/2023 - Review Complete 10/02/2023  Allergen Reaction Noted   Amoxil [amoxicillin] Anaphylaxis and Rash 10/31/2015   Macrobid [nitrofurantoin monohyd macro] Anaphylaxis, Rash, and Other (See Comments) 10/31/2015   Codeine Nausea And Vomiting and Other (See Comments) 07/14/2012   Darvon [propoxyphene] Nausea And Vomiting and Other (See Comments) 06/27/2014   Phenergan  [promethazine ] Other (See Comments) 02/28/2018   Pravachol [pravastatin] Other (See Comments) 09/22/2015   Rocephin  [ceftriaxone ] Other (See Comments)  11/13/2020   Sulfa antibiotics Other (See Comments) 05/29/2022   Hydrocodone-acetaminophen  Nausea And Vomiting and Other (See Comments) 02/04/2012   Mirtazapine Other (See Comments) 03/15/2022   Neurontin  [gabapentin ] Nausea Only 12/21/2020    Family History  Problem Relation Age of Onset   Heart disease Father     Social History   Socioeconomic History   Marital status: Married    Spouse name: Not on file   Number of children: Not on file   Years of education: Not on file   Highest education level: Not on file  Occupational History   Not on file  Tobacco Use   Smoking status: Never    Passive exposure: Never   Smokeless tobacco: Never  Vaping Use   Vaping status: Never Used  Substance and Sexual Activity   Alcohol use: No    Alcohol/week: 0.0 standard drinks of alcohol   Drug use: No   Sexual activity: Not Currently    Birth control/protection: Surgical  Other Topics Concern   Not on file  Social History Narrative   Not on file   Social Drivers of Health   Financial Resource Strain: Not on file  Food Insecurity: No Food Insecurity (10/03/2023)   Hunger Vital Sign    Worried About Running Out of Food in the Last Year: Never true    Ran Out of Food in the Last Year: Never true  Transportation Needs: No Transportation Needs (10/03/2023)   PRAPARE - Administrator, Civil Service (Medical): No    Lack of Transportation (Non-Medical): No  Physical Activity: Not on file  Stress: Not on file  Social Connections: Moderately Integrated (10/03/2023)   Social Connection and Isolation Panel [NHANES]    Frequency of Communication with Friends and Family: More than three times a week    Frequency of Social Gatherings with Friends and Family: More than three times a week    Attends Religious Services: More than 4 times per year    Active Member of [employer] or Organizations: Yes    Attends Banker Meetings: More than 4 times per  year    Marital Status:  Widowed  Intimate Partner Violence: Not At Risk (10/03/2023)   Humiliation, Afraid, Rape, and Kick questionnaire    Fear of Current or Ex-Partner: No    Emotionally Abused: No    Physically Abused: No    Sexually Abused: No     Review of Systems   Gen: Denies any fever, chills, loss of appetite, change in weight or weight loss CV: Denies chest pain, heart palpitations, syncope, edema  Resp: Denies shortness of breath with rest, cough, wheezing, coughing up blood, and pleurisy. GI: Denies vomiting blood, jaundice, and fecal incontinence.   Denies dysphagia or odynophagia. GU : Denies urinary burning, blood in urine, urinary frequency, and urinary incontinence. MS: Denies joint pain, limitation of movement, swelling, cramps, and atrophy.  Derm: Denies rash, itching, dry skin, hives. Psych: Denies depression, anxiety, memory loss, hallucinations, and confusion. Heme: Denies bruising or bleeding Neuro:  Denies any headaches, dizziness, paresthesias, shaking  Physical Exam   Vital Signs in last 24 hours: Temp:  [96.8 F (36 C)-99.4 F (37.4 C)] 99.4 F (37.4 C) (05/16 0741) Pulse Rate:  [60-70] 60 (05/16 0741) Resp:  [15-18] 18 (05/16 0741) BP: (121-151)/(59-95) 144/68 (05/16 0741) SpO2:  [93 %-97 %] 96 % (05/16 0741) Weight:  [71.7 kg-72.3 kg] 71.7 kg (05/16 0031)    General:   Alert, pleasant and cooperative in NAD Head:  Normocephalic and atraumatic. Eyes:  Sclera clear, no icterus.   Conjunctiva pink. Ears:  Normal auditory acuity. Lungs:  Clear throughout to auscultation.   No wheezes, crackles, or rhonchi. No acute distress. Heart:  Regular rate and rhythm; paced beats. no murmurs, clicks, rubs,  or gallops. Abdomen:  Soft, nontender and nondistended. No masses, hepatosplenomegaly or hernias noted. Normal bowel sounds, without guarding, and without rebound.   Rectal: deferred Neurologic:  Alert and  oriented x4. Skin:  Intact without significant lesions or  rashes. Psych:  Alert and cooperative. Normal mood and affect.  Intake/Output from previous day: 05/15 0701 - 05/16 0700 In: 818.8 [I.V.:818.8] Out: -  Intake/Output this shift: No intake/output data recorded.   Labs/Studies   Recent Labs Recent Labs    10/02/23 1526 10/02/23 1534 10/03/23 0416  WBC 8.3  --  7.1  HGB 12.5 12.9 11.8*  HCT 37.3 38.0 35.1*  PLT 188  --  161   BMET Recent Labs    10/02/23 1526 10/02/23 1534 10/03/23 0416  NA 133* 137 136  K 4.0 4.1 3.9  CL 99 100 103  CO2 26  --  26  GLUCOSE 131* 131* 108*  BUN 12 11 11   CREATININE 1.22* 1.20* 1.11*  CALCIUM  9.5  --  9.1   LFT Recent Labs    10/02/23 1526 10/03/23 0416  PROT 6.8 6.0*  ALBUMIN 3.4* 3.0*  AST 23 21  ALT 13 12  ALKPHOS 128* 110  BILITOT 1.2 1.7*   PT/INR No results for input(s): "LABPROT", "INR" in the last 72 hours. Hepatitis Panel No results for input(s): "HEPBSAG", "HCVAB", "HEPAIGM", "HEPBIGM" in the last 72 hours. C-Diff No results for input(s): "CDIFFTOX" in the last 72 hours.  Radiology/Studies CT Angio Head Neck W WO CM Result Date: 10/02/2023 CLINICAL DATA:  Syncope EXAM: CT ANGIOGRAPHY HEAD AND NECK WITH AND WITHOUT CONTRAST TECHNIQUE: Multidetector CT imaging of the head and neck was performed using the standard protocol during bolus administration of intravenous contrast. Multiplanar CT image reconstructions and MIPs were obtained to evaluate the vascular anatomy. Carotid stenosis  measurements (when applicable) are obtained utilizing NASCET criteria, using the distal internal carotid diameter as the denominator. RADIATION DOSE REDUCTION: This exam was performed according to the departmental dose-optimization program which includes automated exposure control, adjustment of the mA and/or kV according to patient size and/or use of iterative reconstruction technique. CONTRAST:  60mL OMNIPAQUE  IOHEXOL  350 MG/ML SOLN COMPARISON:  None Available. FINDINGS: CT HEAD FINDINGS  Brain: There is no mass, hemorrhage or extra-axial collection. The size and configuration of the ventricles and extra-axial CSF spaces are normal. There is hypoattenuation of the white matter, most commonly indicating chronic small vessel disease. 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 Vascular: No hyperdense vessel or unexpected vascular calcification. Skull: The visualized skull base, calvarium and extracranial soft tissues are normal. Sinuses/Orbits: No fluid levels or advanced mucosal thickening of the visualized paranasal sinuses. No mastoid or middle ear effusion. Normal orbits. CTA NECK FINDINGS Skeleton: No acute abnormality or high grade bony spinal canal stenosis. Other neck: Normal pharynx, larynx and major salivary glands. No cervical lymphadenopathy. Unremarkable thyroid  gland. Upper chest: No pneumothorax or pleural effusion. No nodules or masses. Aortic arch: There is no calcific atherosclerosis of the aortic arch. Conventional 3 vessel aortic branching pattern. RIGHT carotid system: No dissection, occlusion or aneurysm. Mild atherosclerotic calcification at the carotid bifurcation without hemodynamically significant stenosis. LEFT carotid system: Normal without aneurysm, dissection or stenosis. Vertebral arteries: Left dominant configuration. Mild multifocal atherosclerosis of the left vertebral artery V2 segment without hemodynamically significant stenosis. The left vertebral artery has a tortuous course with multifocal narrowing and dilatation. Mild irregularity of the right vertebral artery without dissection or hemodynamically significant stenosis. CTA HEAD FINDINGS POSTERIOR CIRCULATION: Mild atherosclerotic calcification of the vertebral artery V4 segments. No proximal occlusion of the anterior or inferior cerebellar arteries. Basilar artery is normal. Superior cerebellar arteries are normal. 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 Severe stenosis of the left PCA P1 and P2  segments. There is occlusion of the left P3 segment. ANTERIOR CIRCULATION: Atherosclerotic calcification of the internal carotid arteries at the skull base without hemodynamically significant stenosis. Anterior cerebral arteries are normal. Middle cerebral arteries are normal. Venous sinuses: As permitted by contrast timing, patent. Anatomic variants: None Review of the MIP images confirms the above findings. IMPRESSION: 1. 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 2. Severe stenosis of the left PCA P1 and P2 segments with occlusion of the left P3 segment. 3. Tortuous courses of the vertebral arteries with multifocal narrowing and dilatation, which may be due to fibromuscular dysplasia. 4. No hemodynamically significant stenosis of the carotid arteries. Electronically Signed   By: Juanetta Nordmann M.D.   On: 10/02/2023 22:46   Assessment   Terecia Sloane is a 89 y.o. year old female with history of type 2 diabetes, HTN, Candida esophagitis with stricture, CKD stage III, PE (completed 12 months anticoagulation), Mobitz 2 AV block s/p PPM who presented to the ED yesterday with reports of presyncope/syncopal episodes for many months and intractable nausea, vomiting, and dysphagia after feeling like pill has been stuck for a few days.  GI consulted for further evaluation and management.  Dysphagia, ?esophagitis:  - has history of candida esophagitis on EGD in 2023 - most recently having sensation of pill stuck in her throat (abx) which has caused some vomiting and regurgitation of solids, has been on primarily liquid diet since Tuesday.  - treat with ppi daily - will plan for EGD here at [facility] if EKG  and echo stable, if any severe stenosis identified she will need procedures at [facility] - will keep NPO for now - Candida esophagitis remains high on differential list given her history, given age and recent weight loss and lack of appetite - malignancy is also on differential.   I have discussed the  risks, alternatives, benefits with regards to but not limited to the risk of reaction to medication, bleeding, infection, perforation and the patient is agreeable to proceed. Written consent to be obtained.  Syncope: - Chronic with intermittent acute increase in frequency (2-3 times weekly) - current dual chamber pacemaker in place given Mobitz 2 AV block history -cards following, suspects vasovagal/orthostatic hypotension contributing -plans to perform Echo today and possible interrogation of pacemaker -neurological workup negative thus far and no exam deficits - decreased p.o intake also likely contributing, cards has d/c'd lasix.   Anemia: Hgb down to 11.58 today fro, 12.9 on admission, likely secondary to hemodilution given IV fluid administration and bolus.   Plan / Recommendations   PPI daily Npo until procedure candidacy determined Plan for EGD with possible dilation today if cardiac clearance for [facility] procedure given Awaiting cardiology clearance post echo  Addendum: Just received update from cardiology, Dr. Amanda Jungling who reviewed patient's echo who states that it looks fine and there is no cardiac findings that would delay procedure being performed today.  Updated anesthesia and Dr. Riley Cheadle have agreed to go ahead and proceed with a EGD today.    10/03/2023, 9:53 AM  Julian Obey, MSN, FNP-BC, AGACNP-BC [facility]

## 2023-10-03 NOTE — Transfer of Care (Signed)
 Immediate Anesthesia Transfer of Care Note  Patient: Traci Clark  Procedure(s) Performed: EGD (ESOPHAGOGASTRODUODENOSCOPY) DILATION, ESOPHAGUS  Patient Location: PACU  Anesthesia Type:General  Level of Consciousness: awake and alert   Airway & Oxygen  Therapy: Patient Spontanous Breathing  Post-op Assessment: Report given to RN and Post -op Vital signs reviewed and stable  Post vital signs: Reviewed and stable  Last Vitals:  Vitals Value Taken Time  BP 132/70 10/03/23 1715  Temp 37.1 C 10/03/23 1713  Pulse 64 10/03/23 1717  Resp 14 10/03/23 1717  SpO2 98 % 10/03/23 1717  Vitals shown include unfiled device data.  Last Pain:  Vitals:   10/03/23 1713  TempSrc:   PainSc: 0-No pain         Complications: No notable events documented.

## 2023-10-03 NOTE — Progress Notes (Signed)
 Arrived at patient room to do EEG. Patient not available went down to have a  EGD

## 2023-10-03 NOTE — Consult Note (Addendum)
 Cardiology Consultation   Patient ID: Traci Clark MRN: [id number]; DOB: 01-14-1931  Admit date: 10/02/2023 Date of Consult: 10/03/2023  PCP:  Jolynn Needy, PA   [facility] Providers Cardiologist:  Lasalle Pointer, MD        Patient Profile:   Traci Clark is a 89 y.o. female with a hx of high-grade AV block (s/p St. Jude PPM placement in 08/2022), HTN, Type II DM, Stage III CKD, history of PE, history of colon cancer and bilateral iliac artery aneurysm who is being seen 10/03/2023 for the evaluation of syncope at the request of Dr. Winferd Hatter.  History of Present Illness:   Traci Clark was last examined by Dr. Mallipeddi in 01/2023 and was overall doing well at that time and reported only 1 episode of chest pain but no recent recurrence. She reported still having episodic dizziness which was felt to be due to orthostatic hypotension and she was consuming approximately 1 L of water  daily but had been started on Lasix by Nephrology. Weight was stable at 163 lbs and no changes were made to her cardiac medications. She was encouraged to increase fluid intake given her orthostasis.  She presented to [facility] ED on 10/02/2023 reporting multiple episodes of vomiting and feeling like a pill was stuck in her throat for several days. Also reported multiple episodes of syncope within the past few months which had acutely worsened within the past week. Also reported intermittent headache along her right frontal area. Initial labs showed WBC 8.3, Hgb 12.5, platelets 188, Na+ 133, K+ 4.0 and creatinine 1.22. AST 23 and ALT 13. Magnesium 1.9. CTA Head and Neck showed occlusion of the proximal P3 segment of the right PCA and severe stenosis of the left PCA P1 and P2 segments with occlusion of left P3 segment. Was also noted to have torturous courses of the vertebral arteries with multifocal narrowing in dilatation which may be due to fibromuscular dysplasia. EKG not in the system. A  repeat echocardiogram was ordered and is pending for today.  In talking with the patient today, she reports having intermittent syncopal episodes over the past few months. These episodes can occur while sitting, while in bed or when walking. Reports she feels like she is going to pass out and may lose consciousness for a few seconds and then regains consciousness. Reports these occur a few times each week. Episodes have been more severe since she has been having episodes of vomiting since Monday due to feeling like a pill is stuck in her throat. Reports occasional discomfort along her PPM site but no exertional chest pain. Has baseline dyspnea but no acute changes in this. No specific orthopnea, PND or pitting edema. Lives with her grandson but performs ADL's independently. Says that her appetite has decreased over the past several months.  She still tries to consume at least 30 to 40 ounces of fluid a day. Takes Lasix 20 mg daily.   Past Medical History:  Diagnosis Date   Anxiety    Arthritis    Chronic constipation    CKD (chronic kidney disease), stage III (HCC)    Colon cancer (HCC)    colon   Complication of anesthesia    Diabetes mellitus without complication (HCC)    DVT (deep vein thrombosis) in pregnancy    Dysphagia    GERD (gastroesophageal reflux disease)    Glaucoma    Gout    H/O hiatal hernia    History of kidney  stones    Iliac artery aneurysm, bilateral (HCC)    MI (myocardial infarction) (HCC) 1993   PONV (postoperative nausea and vomiting)    Restless leg syndrome    Seizure (HCC)    withdrawal from xanax    Vertigo     Past Surgical History:  Procedure Laterality Date   BIOPSY  07/21/2020   Procedure: BIOPSY;  Surgeon: Urban Garden, MD;  [facility];  Service: Gastroenterology;;   BIOPSY  03/26/2022   Procedure: BIOPSY;  Surgeon: Urban Garden, MD;  [facility];  Service: Gastroenterology;;   CHOLECYSTECTOMY      COLECTOMY  2003   COLONOSCOPY N/A 07/29/2012   Procedure: COLONOSCOPY;  Surgeon: Ruby Corporal, MD;  [facility];  Service: Endoscopy;  Laterality: N/A;  100   COLONOSCOPY WITH PROPOFOL  N/A 12/13/2016   Procedure: COLONOSCOPY WITH PROPOFOL ;  Surgeon: Ruby Corporal, MD;  [facility];  Service: Endoscopy;  Laterality: N/A;  8:30   COLONOSCOPY WITH PROPOFOL  N/A 07/21/2020   Procedure: COLONOSCOPY WITH PROPOFOL ;  Surgeon: Urban Garden, MD;  [facility];  Service: Gastroenterology;  Laterality: N/A;  AM   ESOPHAGEAL DILATION  11/03/2015   Procedure: ESOPHAGEAL DILATION;  Surgeon: Ruby Corporal, MD;  [facility];  Service: Endoscopy;;   ESOPHAGEAL DILATION N/A 09/26/2017   Procedure: ESOPHAGEAL DILATION;  Surgeon: Ruby Corporal, MD;  [facility];  Service: Endoscopy;  Laterality: N/A;   ESOPHAGOGASTRODUODENOSCOPY N/A 03/25/2014   Procedure: ESOPHAGOGASTRODUODENOSCOPY (EGD);  Surgeon: Ruby Corporal, MD;  [facility];  Service: Endoscopy;  Laterality: N/A;  1055   ESOPHAGOGASTRODUODENOSCOPY N/A 11/03/2015   Procedure: ESOPHAGOGASTRODUODENOSCOPY (EGD);  Surgeon: Ruby Corporal, MD;  [facility];  Service: Endoscopy;  Laterality: N/A;  1:55 - moved to 11:15 - Ann to notify - moved to 12:45 - pt knows to arrive at 11:45   ESOPHAGOGASTRODUODENOSCOPY N/A 09/26/2017   Procedure: ESOPHAGOGASTRODUODENOSCOPY (EGD);  Surgeon: Ruby Corporal, MD;  [facility];  Service: Endoscopy;  Laterality: N/A;   ESOPHAGOGASTRODUODENOSCOPY (EGD) WITH PROPOFOL  N/A 07/21/2020   Procedure: ESOPHAGOGASTRODUODENOSCOPY (EGD) WITH PROPOFOL ;  Surgeon: Urban Garden, MD;  [facility];  Service: Gastroenterology;  Laterality: N/A;   ESOPHAGOGASTRODUODENOSCOPY (EGD) WITH PROPOFOL  N/A 03/26/2022   Procedure: ESOPHAGOGASTRODUODENOSCOPY (EGD) WITH PROPOFOL ;  Surgeon: Urban Garden, MD;  [facility];  Service: Gastroenterology;  Laterality: N/A;  1215 ASA 3   MALONEY DILATION N/A 03/25/2014   Procedure: MALONEY DILATION;  Surgeon: Ruby Corporal, MD;  [facility];  Service: Endoscopy;  Laterality: N/A;   PACEMAKER IMPLANT N/A 08/27/2022   Procedure: PACEMAKER IMPLANT;  Surgeon: Efraim Grange, MD;  [facility];  Service: Cardiovascular;  Laterality: N/A;   POLYPECTOMY  12/13/2016   Procedure: POLYPECTOMY;  Surgeon: Ruby Corporal, MD;  [facility];  Service: Endoscopy;;  polyp   POLYPECTOMY  07/21/2020   Procedure: POLYPECTOMY;  Surgeon: Urban Garden, MD;  [facility];  Service: Gastroenterology;;   TOTAL HIP ARTHROPLASTY Left    TUBAL LIGATION     VEIN SURGERY Bilateral    stripped vein due to DVT     Home Medications:  Prior to Admission medications   Medication Sig Start Date End Date Taking? Authorizing Provider  acetaminophen  (TYLENOL ) 500 MG tablet Take 1,000 mg by mouth every 6 (six) hours as needed (  pain.). Gelcaps   Yes [provider]  albuterol  (VENTOLIN  HFA) 108 (90 Base) MCG/ACT inhaler Inhale 1-2 puffs into the lungs every 6 (six) hours as needed for shortness of breath or wheezing.   Yes [provider]  allopurinol  (ZYLOPRIM ) 100 MG tablet Take 100 mg by mouth in the morning.   Yes [provider]  ALPRAZolam  (XANAX ) 0.5 MG tablet Take 0.5 mg by mouth 3 (three) times daily as needed for anxiety.   Yes [provider]  ciprofloxacin  (CIPRO ) 500 MG tablet Take 500 mg by mouth 2 (two) times daily. 09/23/23  Yes [provider]  dexlansoprazole (DEXILANT) 60 MG capsule Take 60 mg by mouth daily before breakfast.   Yes [provider]  fluticasone (FLONASE) 50 MCG/ACT nasal spray Place 2 sprays into both nostrils daily as needed (allergies (cough/sneezing)). 09/24/20  Yes [provider]  furosemide (LASIX) 20 MG tablet Take 20 mg by  mouth in the morning.   Yes [provider]  insulin  aspart (NOVOLOG  FLEXPEN RELION) 100 UNIT/ML FlexPen Inject 4-10 Units into the skin 3 (three) times daily with meals. Per sliding scale >150 = 4 units 200-250 = 6 units 251-350 = 8 units >351 = 10 units   Yes [provider]  Insulin  Glargine (BASAGLAR  KWIKPEN) 100 UNIT/ML Inject 20 Units into the skin at bedtime. 06/15/23  Yes [provider]  latanoprost  (XALATAN ) 0.005 % ophthalmic solution Place 1 drop into both eyes at bedtime. 09/21/20  Yes [provider]  LUTEIN PO Take 40 mg by mouth daily.   Yes [provider]  meclizine  (ANTIVERT ) 25 MG tablet Take 1 tablet (25 mg total) by mouth 3 (three) times daily as needed for dizziness. 07/30/21  Yes Carlan, Chelsea L, NP  Menthol , Topical Analgesic, (ICY HOT BACK EXTRA STRENGTH) 5 % PTCH Place 1 patch onto the skin daily as needed (pain.).   Yes [provider]  nystatin  cream (MYCOSTATIN ) Apply 1 Application topically 2 (two) times daily. 09/28/23  Yes [provider]  ondansetron  (ZOFRAN ) 4 MG tablet Take 4 mg by mouth every 8 (eight) hours as needed for nausea or vomiting. Dissolving tablet 08/21/23  Yes [provider]  phenazopyridine (PYRIDIUM) 200 MG tablet Take 200 mg by mouth 3 (three) times daily as needed for pain. 09/23/23  Yes [provider]  polyethylene glycol (MIRALAX  / GLYCOLAX ) packet Take 34 g by mouth in the morning.   Yes [provider]  potassium chloride (MICRO-K) 10 MEQ CR capsule Take 10 mEq by mouth in the morning.   Yes [provider]  tacrolimus (PROTOPIC) 0.1 % ointment Apply 1 Application topically 2 (two) times daily. 06/23/23  Yes [provider]  traMADol  (ULTRAM ) 50 MG tablet Take 1 tablet (50 mg total) by mouth every 6 (six) hours as needed for severe pain. 04/17/22  Yes Shah, Pratik D, DO  triamcinolone ointment (KENALOG) 0.1 % Apply 1 Application topically  daily. 08/19/22  Yes [provider]  clobetasol ointment (TEMOVATE) 0.05 % Apply 1 Application topically 2 (two) times daily as needed (irritation.). 07/22/22   [provider]    Inpatient Medications: Scheduled Meds:  feeding supplement  237 mL Oral BID BM   insulin  aspart  0-9 Units Subcutaneous Q4H   pantoprazole   40 mg Oral Daily   Continuous Infusions:  lactated ringers      PRN Meds: ondansetron  **OR** ondansetron  (ZOFRAN ) IV  Allergies:    Allergies  Allergen Reactions  Amoxil [Amoxicillin] Anaphylaxis and Rash    "Skin peeled off"   Macrobid [Nitrofurantoin Monohyd Macro] Anaphylaxis, Rash and Other (See Comments)    "Skin peeled off"   Codeine Nausea And Vomiting and Other (See Comments)    Shaking    Darvon [Propoxyphene] Nausea And Vomiting and Other (See Comments)    Shaking    Phenergan  [Promethazine ] Other (See Comments)    Altered mental changes   Pravachol [Pravastatin] Other (See Comments)    Myalgias    Rocephin  [Ceftriaxone ] Other (See Comments)    Yeast infection   Sulfa Antibiotics Other (See Comments)    Thrush and itching   Hydrocodone-Acetaminophen  Nausea And Vomiting and Other (See Comments)    Causes chest to feel heavy     Mirtazapine Other (See Comments)    Does not tolerate   Neurontin  [Gabapentin ] Nausea Only    Feel faint    Social History:   Social History   Socioeconomic History   Marital status: Married    Spouse name: Not on file   Number of children: Not on file   Years of education: Not on file   Highest education level: Not on file  Occupational History   Not on file  Tobacco Use   Smoking status: Never    Passive exposure: Never   Smokeless tobacco: Never  Vaping Use   Vaping status: Never Used  Substance and Sexual Activity   Alcohol use: No    Alcohol/week: 0.0 standard drinks of alcohol   Drug use: No   Sexual activity: Not Currently    Birth control/protection: Surgical  Other Topics Concern    Not on file  Social History Narrative   Not on file   Social Drivers of Health   Financial Resource Strain: Not on file  Food Insecurity: No Food Insecurity (10/03/2023)   Hunger Vital Sign    Worried About Running Out of Food in the Last Year: Never true    Ran Out of Food in the Last Year: Never true  Transportation Needs: No Transportation Needs (10/03/2023)   PRAPARE - Administrator, Civil Service (Medical): No    Lack of Transportation (Non-Medical): No  Physical Activity: Not on file  Stress: Not on file  Social Connections: Moderately Integrated (10/03/2023)   Social Connection and Isolation Panel [NHANES]    Frequency of Communication with Friends and Family: More than three times a week    Frequency of Social Gatherings with Friends and Family: More than three times a week    Attends Religious Services: More than 4 times per year    Active Member of [employer] or Organizations: Yes    Attends Banker Meetings: More than 4 times per year    Marital Status: Widowed  Intimate Partner Violence: Not At Risk (10/03/2023)   Humiliation, Afraid, Rape, and Kick questionnaire    Fear of Current or Ex-Partner: No    Emotionally Abused: No    Physically Abused: No    Sexually Abused: No    Family History:    Family History  Problem Relation Age of Onset   Heart disease Father      ROS:  Please see the history of present illness.   All other ROS reviewed and negative.     Physical Exam/Data:   Vitals:   10/03/23 0000 10/03/23 0031 10/03/23 0419 10/03/23 0741  BP: (!) 121/59 (!) 151/78 135/69 (!) 144/68  Pulse: 63 66 61 60  Resp: 16 16  15 18  Temp:  98.1 F (36.7 C) 98.2 F (36.8 C) 99.4 F (37.4 C)  TempSrc:  Oral Oral Oral  SpO2: 95% 95% 97% 96%  Weight:  71.7 kg    Height:  5\' 6"  (1.676 m)      Intake/Output Summary (Last 24 hours) at 10/03/2023 0850 Last data filed at 10/03/2023 0544 Gross per 24 hour  Intake 818.75 ml  Output --  Net  818.75 ml      10/03/2023   12:31 AM 10/02/2023    2:28 PM 08/26/2023    2:57 PM  Last 3 Weights  Weight (lbs) 158 lb 1.1 oz 159 lb 6.3 oz 159 lb 8 oz  Weight (kg) 71.7 kg 72.3 kg 72.349 kg     Body mass index is 25.51 kg/m.  General: Pleasant, elderly female appearing in no acute distress. HEENT: normal Neck: no JVD Vascular: No carotid bruits; Distal pulses 2+ bilaterally Cardiac:  normal S1, S2; RRR; no murmur  Lungs:  clear to auscultation bilaterally, no wheezing, rhonchi or rales  Abd: soft, nontender, no hepatomegaly  Ext: trace ankle edema bilaterally.  Musculoskeletal:  No deformities, BUE and BLE strength normal and equal Skin: warm and dry  Neuro:  CNs 2-12 intact, no focal abnormalities noted Psych:  Normal affect   EKG:  No EKG in the system for this admission. Will order.  Telemetry:  Telemetry was personally reviewed and demonstrates: AV paced, HR in 60's.   Relevant CV Studies:  Echocardiogram: 05/2022 IMPRESSIONS     1. Left ventricular ejection fraction, by estimation, is 55 to 60%. The  left ventricle has normal function. The left ventricle has no regional  wall motion abnormalities. There is moderate left ventricular hypertrophy.  Left ventricular diastolic  parameters are consistent with Grade I diastolic dysfunction (impaired  relaxation).   2. Right ventricular systolic function poorly visualized but appears to  be normal. The right ventricular size is normal.   3. The mitral valve is grossly normal. No evidence of mitral valve  regurgitation. No evidence of mitral stenosis.   4. The aortic valve is tricuspid. There is mild calcification of the  aortic valve. Aortic valve regurgitation is not visualized. No aortic  stenosis is present.   Comparison(s): No significant change from prior study.   Laboratory Data:  High Sensitivity Troponin:  No results for input(s): "TROPONINIHS" in the last 720 hours.   Chemistry Recent Labs  Lab 10/02/23 1526  10/02/23 1534 10/03/23 0416  NA 133* 137 136  K 4.0 4.1 3.9  CL 99 100 103  CO2 26  --  26  GLUCOSE 131* 131* 108*  BUN 12 11 11   CREATININE 1.22* 1.20* 1.11*  CALCIUM  9.5  --  9.1  MG  --   --  1.9  GFRNONAA 42*  --  47*  ANIONGAP 8  --  7    Recent Labs  Lab 10/02/23 1526 10/03/23 0416  PROT 6.8 6.0*  ALBUMIN 3.4* 3.0*  AST 23 21  ALT 13 12  ALKPHOS 128* 110  BILITOT 1.2 1.7*   Lipids No results for input(s): "CHOL", "TRIG", "HDL", "LABVLDL", "LDLCALC", "CHOLHDL" in the last 168 hours.  Hematology Recent Labs  Lab 10/02/23 1526 10/02/23 1534 10/03/23 0416  WBC 8.3  --  7.1  RBC 4.29  --  4.09  HGB 12.5 12.9 11.8*  HCT 37.3 38.0 35.1*  MCV 86.9  --  85.8  MCH 29.1  --  28.9  MCHC 33.5  --  33.6  RDW 14.1  --  14.2  PLT 188  --  161   Thyroid  No results for input(s): "TSH", "FREET4" in the last 168 hours.  BNPNo results for input(s): "BNP", "PROBNP" in the last 168 hours.  DDimer No results for input(s): "DDIMER" in the last 168 hours.   Radiology/Studies:  CT Angio Head Neck W WO CM Result Date: 10/02/2023 CLINICAL DATA:  Syncope EXAM: CT ANGIOGRAPHY HEAD AND NECK WITH AND WITHOUT CONTRAST TECHNIQUE: Multidetector CT imaging of the head and neck was performed using the standard protocol during bolus administration of intravenous contrast. Multiplanar CT image reconstructions and MIPs were obtained to evaluate the vascular anatomy. Carotid stenosis measurements (when applicable) are obtained utilizing NASCET criteria, using the distal internal carotid diameter as the denominator. RADIATION DOSE REDUCTION: This exam was performed according to the departmental dose-optimization program which includes automated exposure control, adjustment of the mA and/or kV according to patient size and/or use of iterative reconstruction technique. CONTRAST:  60mL OMNIPAQUE  IOHEXOL  350 MG/ML SOLN COMPARISON:  None Available. FINDINGS: CT HEAD FINDINGS Brain: There is no mass,  hemorrhage or extra-axial collection. The size and configuration of the ventricles and extra-axial CSF spaces are normal. There is hypoattenuation of the white matter, most commonly indicating chronic small vessel disease. 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 Vascular: No hyperdense vessel or unexpected vascular calcification. Skull: The visualized skull base, calvarium and extracranial soft tissues are normal. Sinuses/Orbits: No fluid levels or advanced mucosal thickening of the visualized paranasal sinuses. No mastoid or middle ear effusion. Normal orbits. CTA NECK FINDINGS Skeleton: No acute abnormality or high grade bony spinal canal stenosis. Other neck: Normal pharynx, larynx and major salivary glands. No cervical lymphadenopathy. Unremarkable thyroid  gland. Upper chest: No pneumothorax or pleural effusion. No nodules or masses. Aortic arch: There is no calcific atherosclerosis of the aortic arch. Conventional 3 vessel aortic branching pattern. RIGHT carotid system: No dissection, occlusion or aneurysm. Mild atherosclerotic calcification at the carotid bifurcation without hemodynamically significant stenosis. LEFT carotid system: Normal without aneurysm, dissection or stenosis. Vertebral arteries: Left dominant configuration. Mild multifocal atherosclerosis of the left vertebral artery V2 segment without hemodynamically significant stenosis. The left vertebral artery has a tortuous course with multifocal narrowing and dilatation. Mild irregularity of the right vertebral artery without dissection or hemodynamically significant stenosis. CTA HEAD FINDINGS POSTERIOR CIRCULATION: Mild atherosclerotic calcification of the vertebral artery V4 segments. No proximal occlusion of the anterior or inferior cerebellar arteries. Basilar artery is normal. Superior cerebellar arteries are normal. 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 Severe stenosis of the left PCA P1 and P2 segments. There is occlusion  of the left P3 segment. ANTERIOR CIRCULATION: Atherosclerotic calcification of the internal carotid arteries at the skull base without hemodynamically significant stenosis. Anterior cerebral arteries are normal. Middle cerebral arteries are normal. Venous sinuses: As permitted by contrast timing, patent. Anatomic variants: None Review of the MIP images confirms the above findings. IMPRESSION: 1. 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 2. Severe stenosis of the left PCA P1 and P2 segments with occlusion of the left P3 segment. 3. Tortuous courses of the vertebral arteries with multifocal narrowing and dilatation, which may be due to fibromuscular dysplasia. 4. No hemodynamically significant stenosis of the carotid arteries. Electronically Signed   By: Juanetta Nordmann M.D.   On: 10/02/2023 22:46     Assessment and Plan:   1. Syncope - Reports multiple episodes over the past few months and  has been experiencing these for a few years by review of her chart. Previously underwent PPM placement as discussed above but has continued to have syncopal episodes and felt to be due to orthostasis. Did ask for orthostatic vitals to be checked this morning and found to be profoundly orthostatic with BP dropping from 141/78 with sitting to 93/68 with standing. She has been started on IV fluids by the admitting team and will hold PTA Lasix 20 mg at this time. Can arrange for device interrogation for her PPM but suspect her syncopal episodes are due to orthostasis and possibly vasovagal component in the setting of frequent vomiting this week.   2. High-grade AV block - She is s/p St. Jude PPM placement in 08/2022. Currently AV paced on telemetry and device interrogation 08/2023 showed her device was functioning normally. Can arrange for repeat device interrogation this admission.   3. HTN - Listed in past medical history but not on any antihypertensives prior to admission except for Lasix 20 mg daily. Will  hold Lasix as discussed above in the setting of orthostatic hypotension.  4. Stage 3 CKD - Creatinine was at 1.2 on admission and improved to 1.11 today. Close to known baseline.  5. Dysphagia/Vomiting - GI consult pending and planning for EGD by review of notes. She is currently NPO. From a cardiac perspective, she denies any specific anginal symptoms and has no known history of CAD or CHF. Will obtain a 12-Lead EKG since one has not been obtained this admission.    For questions or updates, please contact  [facility] Please consult [email or web address] for contact info under    Signed, Dorma Gash, PA-C  10/03/2023 8:50 AM  Attending Note  Patient seen and disucssed with PA Finis Hugger, I agree with her documentation. 89 yo female history of HTN, DM2, CKD III, permanent pacemaker for 2:1 AV block, orthostatic hypotension, history of PE on anticoag, presented with vomiting, dysphagia, and presyncope. Reports feeling of lightheadedness/dizziness that comes on out of the blue. No specific triggers. No associated palpitations. Short spells she calls  them, very transient short changes in consciousness like she is blacking out but denies full syncope.    WBC 8.3 Hgb 12.5 Plt 188 Na 133 K 4 Cr 1.22 BUN 12  EKG pending Echo pending CTA: occlued P3 segment right PCA, severe setnosis left PCA P1 and P2 segments with occlusion of left P3 segment.     1.Presyncope - unclear etiology based on history. Reports symptoms can occur in any position. No specific triggers or patterns. Symptoms have increased over the last few weeks in setting of decresed oral intake and some recent vomiting.  - EKG is pending, device check is pending, echo pending - orthostatics this morning markedly abnormal, SBP 141-->93 with standing and DBP 78-->68 and this was done after getting IVFs overnight. Chronic orthostatic hypotension based on records, also recently poor intake and increased volume losses with  vomiting. Would think certaintly playing some role however the history she gives is not classic orthostatic syncope - discontinue er lasix. Continue IVFs. If persistent symptoms consider compression stockings +/- abdominal binder. Avoid florinef due to issues with edema, SBPs 140s midodrine may lead to significant hypertension but may warrant a trial pending symptoms.  - f/u echo,device check.    Armida Lander MD

## 2023-10-03 NOTE — Progress Notes (Signed)
*  PRELIMINARY RESULTS* Echocardiogram 2D Echocardiogram has been performed.  Traci Clark 10/03/2023, 12:45 PM

## 2023-10-04 DIAGNOSIS — R55 Syncope and collapse: Secondary | ICD-10-CM | POA: Diagnosis not present

## 2023-10-04 DIAGNOSIS — R1310 Dysphagia, unspecified: Secondary | ICD-10-CM | POA: Diagnosis not present

## 2023-10-04 DIAGNOSIS — E119 Type 2 diabetes mellitus without complications: Secondary | ICD-10-CM | POA: Diagnosis not present

## 2023-10-04 DIAGNOSIS — K209 Esophagitis, unspecified without bleeding: Secondary | ICD-10-CM | POA: Diagnosis not present

## 2023-10-04 LAB — BASIC METABOLIC PANEL WITH GFR
Anion gap: 8 (ref 5–15)
BUN: 11 mg/dL (ref 8–23)
CO2: 25 mmol/L (ref 22–32)
Calcium: 9 mg/dL (ref 8.9–10.3)
Chloride: 103 mmol/L (ref 98–111)
Creatinine, Ser: 1.06 mg/dL — ABNORMAL HIGH (ref 0.44–1.00)
GFR, Estimated: 50 mL/min — ABNORMAL LOW (ref >60)
Glucose, Bld: 122 mg/dL — ABNORMAL HIGH (ref 70–99)
Potassium: 3.8 mmol/L (ref 3.5–5.1)
Sodium: 136 mmol/L (ref 135–145)

## 2023-10-04 LAB — GLUCOSE, CAPILLARY
Glucose-Capillary: 103 mg/dL — ABNORMAL HIGH (ref 70–99)
Glucose-Capillary: 130 mg/dL — ABNORMAL HIGH (ref 70–99)
Glucose-Capillary: 137 mg/dL — ABNORMAL HIGH (ref 70–99)
Glucose-Capillary: 158 mg/dL — ABNORMAL HIGH (ref 70–99)

## 2023-10-04 LAB — MAGNESIUM: Magnesium: 1.8 mg/dL (ref 1.7–2.4)

## 2023-10-04 LAB — CORTISOL-AM, BLOOD: Cortisol - AM: 10 ug/dL (ref 6.7–22.6)

## 2023-10-04 MED ORDER — LATANOPROST 0.005 % OP SOLN: 1.0000 [drp] | Freq: Every day | OPHTHALMIC | Status: DC

## 2023-10-04 MED ORDER — ACETAMINOPHEN 500 MG PO TABS: 1000.0000 mg | ORAL_TABLET | Freq: Three times a day (TID) | ORAL | Status: DC

## 2023-10-04 NOTE — Progress Notes (Signed)
 Mobility Specialist Progress Note:    10/04/23 0850  Mobility  Activity Ambulated with assistance in hallway  Level of Assistance Standby assist, set-up cues, supervision of patient - no hands on  Assistive Device Front wheel walker  Distance Ambulated (ft) 40 ft  Range of Motion/Exercises Active;All extremities  Activity Response Tolerated well  Mobility Referral Yes  Mobility visit 1 Mobility  Mobility Specialist Start Time (ACUTE ONLY) 0850  Mobility Specialist Stop Time (ACUTE ONLY) 0910  Mobility Specialist Time Calculation (min) (ACUTE ONLY) 20 min   Pt received in chair, agreeable to mobility. Required SBA to stand and ambulate with RW. Tolerated well, BP wile sitting was 115/70 and BP standing was 104/67. Returned pt to chair, call bell in reach. All needs met.  Sai Zinn Mobility Specialist Please contact via Special educational needs teacher or  Rehab office at [phone]

## 2023-10-04 NOTE — Discharge Summary (Signed)
 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 The patient has had numerous presyncope and syncopal type events even since her pacemaker placement.  She describes most of the episodes with changes in position, particular standing up.  However she does have episodes while sitting that only lasts a few seconds.  She denies biting her tongue or incontinence. Since she began developing dysphagia and vomiting, her episodes of syncope have been more frequent, usually associated with her emesis episodes. She denies any fevers, chills, chest pain, shortness of breath, palpitations. She denies any nausea, vomiting, diarrhea.  She has been having some dark stools for " many months".  She denies any hematochezia.  She has some abdominal cramping.  There is no dysuria or hematuria.  She denies focal extremity, dysesthesias or dysarthria. In the ED, the patient was afebrile and hemodynamically stable with oxygen  saturation 97% room air.  WBC 8.3, hemoglobin 12.5, platelets 180.  Sodium 136, potassium 3.9, bicarbonate 26, serum creatinine 1.11. GI and cardiology were consulted to assist with management.  Assessment and Plan: Intractable vomiting/dysphagia - Appreciate GI consult - 10/03/23 EGD--tandem cervical esophageal web and schatzki's ring--dilated - advanced diet post procedure which patient tolerated - Continue pantoprazole >>restart  dexilant after dc   Syncope -I do not feel patient's symptoms are related to stroke - Primarily related to vagal phenomena with history of orthostasis and emesis -No focal neurologic deficits on exam -Certainly, patient could have dysrhythmia - Echo--EF 60-65%, no WMA, G1DD, normal RVF, no AS, trivial MR - Cardiology consult appreciated>>no concerning dysrhythmia - orthostatics POSITIVE - started IVF - repeat orthostatic on 5/17 improved - 10/02/2023--CTA H&N--no hemodynamically significant stenosis in the carotids; occlusion of P3 of right PCA, severe stenosis right PCA P1 P2   CKD stage IIIb - Baseline creatinine 1.1-1.4   Diabetes mellitus type 2, controlled - 04/13/2022 hemoglobin A1c 7.4 - NovoLog  sliding scale - Patient is on Basaglar  20 units at bedtime at home - 10/03/23 A1c--6.8   GERD - Continue pantoprazole  - restart dexilant after d/c   History of pulm embolus - Patient saw Dr. Orvis Blare on 06/23/23 - Apixaban  was discontinued at that time as the patient has taken > 1 yr apixaban    Melena - GI consulted - Planned for endoscopy as  above - Hemoglobin stable           Consultants: GI, cardiology Procedures performed: EGD  Disposition: Home Diet recommendation:  Carb modified diet DISCHARGE MEDICATION: Allergies as of 10/04/2023       Reactions   Amoxil [amoxicillin] Anaphylaxis, Rash   "Skin peeled off"   Macrobid [nitrofurantoin Monohyd Macro] Anaphylaxis, Rash, Other (See Comments)   "Skin peeled off"   Codeine Nausea And Vomiting, Other (See Comments)   Shaking    Darvon [propoxyphene] Nausea And Vomiting, Other (See Comments)   Shaking    Phenergan  [promethazine ] Other (See Comments)   Altered mental changes   Pravachol [pravastatin] Other (  See Comments)   Myalgias    Rocephin  [ceftriaxone ] Other (See Comments)   Yeast infection   Sulfa Antibiotics Other (See Comments)   Thrush and itching   Hydrocodone-acetaminophen  Nausea And Vomiting, Other  (See Comments)   Causes chest to feel heavy    Mirtazapine Other (See Comments)   Does not tolerate   Neurontin  [gabapentin ] Nausea Only   Feel faint        Medication List     STOP taking these medications    ciprofloxacin  500 MG tablet Commonly known as: CIPRO        TAKE these medications    albuterol  108 (90 Base) MCG/ACT inhaler Commonly known as: VENTOLIN  HFA Inhale 1-2 puffs into the lungs every 6 (six) hours as needed for shortness of breath or wheezing.   allopurinol  100 MG tablet Commonly known as: ZYLOPRIM  Take 100 mg by mouth in the morning.   ALPRAZolam  0.5 MG tablet Commonly known as: XANAX  Take 0.5 mg by mouth 3 (three) times daily as needed for anxiety.   Basaglar  KwikPen 100 UNIT/ML Inject 20 Units into the skin at bedtime.   clobetasol ointment 0.05 % Commonly known as: TEMOVATE Apply 1 Application topically 2 (two) times daily as needed (irritation.).   dexlansoprazole 60 MG capsule Commonly known as: DEXILANT Take 60 mg by mouth daily before breakfast.   fluticasone 50 MCG/ACT nasal spray Commonly known as: FLONASE Place 2 sprays into both nostrils daily as needed (allergies (cough/sneezing)).   furosemide  20 MG tablet Commonly known as: LASIX  Take 20 mg by mouth in the morning.   Icy Hot Back Extra Strength 5 % Ptch Generic drug: Menthol  (Topical Analgesic) Place 1 patch onto the skin daily as needed (pain.).   latanoprost  0.005 % ophthalmic solution Commonly known as: XALATAN  Place 1 drop into both eyes at bedtime.   LUTEIN PO Take 40 mg by mouth daily.   meclizine  25 MG tablet Commonly known as: ANTIVERT  Take 1 tablet (25 mg total) by mouth 3 (three) times daily as needed for dizziness.   NovoLOG  FlexPen 100 UNIT/ML FlexPen Generic drug: insulin  aspart Inject 4-10 Units into the skin 3 (three) times daily with meals. Per sliding scale >150 = 4 units 200-250 = 6 units 251-350 = 8 units >351 = 10 units   nystatin   cream Commonly known as: MYCOSTATIN  Apply 1 Application topically 2 (two) times daily.   ondansetron  4 MG tablet Commonly known as: ZOFRAN  Take 4 mg by mouth every 8 (eight) hours as needed for nausea or vomiting. Dissolving tablet   phenazopyridine 200 MG tablet Commonly known as: PYRIDIUM Take 200 mg by mouth 3 (three) times daily as needed for pain.   polyethylene glycol 17 g packet Commonly known as: MIRALAX  / GLYCOLAX  Take 34 g by mouth in the morning.   potassium chloride 10 MEQ CR capsule Commonly known as: MICRO-K Take 10 mEq by mouth in the morning.   tacrolimus 0.1 % ointment Commonly known as: PROTOPIC Apply 1 Application topically 2 (two) times daily.   traMADol  50 MG tablet Commonly known as: ULTRAM  Take 1 tablet (50 mg total) by mouth every 6 (six) hours as needed for severe pain.   triamcinolone ointment 0.1 % Commonly known as: KENALOG Apply 1 Application topically daily.   TYLENOL  500 MG tablet Generic drug: acetaminophen  Take 1,000 mg by mouth every 6 (six) hours as needed (pain.). Gelcaps        Discharge Exam: Filed Weights   10/02/23 1428 10/03/23 0031  10/03/23 1557  Weight: 72.3 kg 71.7 kg 69.9 kg   HEENT:  [place]/AT, No thrush, no icterus CV:  RRR, no rub, no S3, no S4 Lung:  CTA, no wheeze, no rhonchi Abd:  soft/+BS, NT Ext:  No edema, no lymphangitis, no synovitis, no rash   Condition at discharge: stable  The results of significant diagnostics from this hospitalization (including imaging, microbiology, ancillary and laboratory) are listed below for reference.   Imaging Studies: ECHOCARDIOGRAM COMPLETE Result Date: 10/03/2023    ECHOCARDIOGRAM REPORT   Patient Name:   Traci Clark Date of Exam: 10/03/2023 Medical Rec #:  [id number]          Height:       66.0 in Accession #:    [id number]         Weight:       158.1 lb Date of Birth:  Mar 09, 1931          BSA:          1.810 m Patient Age:    89 years           BP:           144/68  mmHg Patient Gender: F                  HR:           60 bpm. Exam Location:  [facility] Procedure: 2D Echo, Cardiac Doppler and Color Doppler (Both Spectral and Color            Flow Doppler were utilized during procedure). Indications:    Syncope R55  History:        Patient has prior history of Echocardiogram examinations, most                 recent 05/30/2022. Pacemaker, Arrythmias:First degree AV block,                 Signs/Symptoms:Syncope and Palpitations; Risk                 Factors:Hypertension, Diabetes and CKD.  Sonographer:    Denese Finn RCS Referring Phys: [id number] OLADAPO ADEFESO IMPRESSIONS  1. Left ventricular ejection fraction, by estimation, is 60 to 65%. The left ventricle has normal function. The left ventricle has no regional wall motion abnormalities. There is severe left ventricular hypertrophy. Left ventricular diastolic parameters  are consistent with Grade I diastolic dysfunction (impaired relaxation).  2. Right ventricular systolic function is normal. The right ventricular size is normal. Tricuspid regurgitation signal is inadequate for assessing PA pressure.  3. The mitral valve is normal in structure. Trivial mitral valve regurgitation. No evidence of mitral stenosis.  4. The tricuspid valve is abnormal.  5. The aortic valve is tricuspid. There is mild calcification of the aortic valve. There is mild thickening of the aortic valve. Aortic valve regurgitation is not visualized. No aortic stenosis is present.  6. The inferior vena cava is normal in size with greater than 50% respiratory variability, suggesting right atrial pressure of 3 mmHg. FINDINGS  Left Ventricle: Left ventricular ejection fraction, by estimation, is 60 to 65%. The left ventricle has normal function. The left ventricle has no regional wall motion abnormalities. The left ventricular internal cavity size was normal in size. There is  severe left ventricular hypertrophy. Left ventricular diastolic parameters are  consistent with Grade I diastolic dysfunction (impaired relaxation). Normal left ventricular filling pressure. Right Ventricle: The right ventricular size is normal. Right vetricular wall  thickness was not well visualized. Right ventricular systolic function is normal. Tricuspid regurgitation signal is inadequate for assessing PA pressure. Left Atrium: Left atrial size was normal in size. Right Atrium: Right atrial size was normal in size. Pericardium: There is no evidence of pericardial effusion. Presence of epicardial fat layer. Mitral Valve: The mitral valve is normal in structure. Trivial mitral valve regurgitation. No evidence of mitral valve stenosis. Tricuspid Valve: The tricuspid valve is abnormal. Tricuspid valve regurgitation is trivial. No evidence of tricuspid stenosis. Aortic Valve: The aortic valve is tricuspid. There is mild calcification of the aortic valve. There is mild thickening of the aortic valve. There is mild aortic valve annular calcification. Aortic valve regurgitation is not visualized. No aortic stenosis  is present. Aortic valve mean gradient measures 2.0 mmHg. Aortic valve peak gradient measures 3.6 mmHg. Aortic valve area, by VTI measures 3.04 cm. Pulmonic Valve: The pulmonic valve was not well visualized. Pulmonic valve regurgitation is not visualized. No evidence of pulmonic stenosis. Aorta: The aortic root and ascending aorta are structurally normal, with no evidence of dilitation. Venous: The inferior vena cava is normal in size with greater than 50% respiratory variability, suggesting right atrial pressure of 3 mmHg. IAS/Shunts: No atrial level shunt detected by color flow Doppler.  LEFT VENTRICLE PLAX 2D LVIDd:         4.60 cm   Diastology LVIDs:         2.60 cm   LV e' medial:    3.97 cm/s LV PW:         1.40 cm   LV E/e' medial:  14.1 LV IVS:        1.70 cm   LV e' lateral:   6.16 cm/s LVOT diam:     2.20 cm   LV E/e' lateral: 9.1 LV SV:         62 LV SV Index:   34 LVOT Area:      3.80 cm  RIGHT VENTRICLE RV S prime:     9.89 cm/s TAPSE (M-mode): 1.6 cm LEFT ATRIUM             Index        RIGHT ATRIUM           Index LA diam:        3.60 cm 1.99 cm/m   RA Area:     20.90 cm LA Vol (A2C):   43.5 ml 24.04 ml/m  RA Volume:   59.70 ml  32.99 ml/m LA Vol (A4C):   34.4 ml 19.01 ml/m LA Biplane Vol: 40.7 ml 22.49 ml/m  AORTIC VALVE AV Area (Vmax):    2.87 cm AV Area (Vmean):   2.63 cm AV Area (VTI):     3.04 cm AV Vmax:           95.36 cm/s AV Vmean:          67.729 cm/s AV VTI:            0.205 m AV Peak Grad:      3.6 mmHg AV Mean Grad:      2.0 mmHg LVOT Vmax:         72.10 cm/s LVOT Vmean:        46.800 cm/s LVOT VTI:          0.164 m LVOT/AV VTI ratio: 0.80  AORTA Ao Root diam: 3.40 cm Ao Asc diam:  3.40 cm MITRAL VALVE MV Area (PHT): 3.12 cm    SHUNTS MV  Decel Time: 243 msec    Systemic VTI:  0.16 m MV E velocity: 55.90 cm/s  Systemic Diam: 2.20 cm MV A velocity: 90.10 cm/s MV E/A ratio:  0.62 Armida Lander MD Electronically signed by Armida Lander MD Signature Date/Time: 10/03/2023/2:53:51 PM    Final    CT Angio Head Neck W WO CM Result Date: 10/02/2023 CLINICAL DATA:  Syncope EXAM: CT ANGIOGRAPHY HEAD AND NECK WITH AND WITHOUT CONTRAST TECHNIQUE: Multidetector CT imaging of the head and neck was performed using the standard protocol during bolus administration of intravenous contrast. Multiplanar CT image reconstructions and MIPs were obtained to evaluate the vascular anatomy. Carotid stenosis measurements (when applicable) are obtained utilizing NASCET criteria, using the distal internal carotid diameter as the denominator. RADIATION DOSE REDUCTION: This exam was performed according to the departmental dose-optimization program which includes automated exposure control, adjustment of the mA and/or kV according to patient size and/or use of iterative reconstruction technique. CONTRAST:  60mL OMNIPAQUE  IOHEXOL  350 MG/ML SOLN COMPARISON:  None Available. FINDINGS: CT  HEAD FINDINGS Brain: There is no mass, hemorrhage or extra-axial collection. The size and configuration of the ventricles and extra-axial CSF spaces are normal. There is hypoattenuation of the white matter, most commonly indicating chronic small vessel disease. 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 Vascular: No hyperdense vessel or unexpected vascular calcification. Skull: The visualized skull base, calvarium and extracranial soft tissues are normal. Sinuses/Orbits: No fluid levels or advanced mucosal thickening of the visualized paranasal sinuses. No mastoid or middle ear effusion. Normal orbits. CTA NECK FINDINGS Skeleton: No acute abnormality or high grade bony spinal canal stenosis. Other neck: Normal pharynx, larynx and major salivary glands. No cervical lymphadenopathy. Unremarkable thyroid  gland. Upper chest: No pneumothorax or pleural effusion. No nodules or masses. Aortic arch: There is no calcific atherosclerosis of the aortic arch. Conventional 3 vessel aortic branching pattern. RIGHT carotid system: No dissection, occlusion or aneurysm. Mild atherosclerotic calcification at the carotid bifurcation without hemodynamically significant stenosis. LEFT carotid system: Normal without aneurysm, dissection or stenosis. Vertebral arteries: Left dominant configuration. Mild multifocal atherosclerosis of the left vertebral artery V2 segment without hemodynamically significant stenosis. The left vertebral artery has a tortuous course with multifocal narrowing and dilatation. Mild irregularity of the right vertebral artery without dissection or hemodynamically significant stenosis. CTA HEAD FINDINGS POSTERIOR CIRCULATION: Mild atherosclerotic calcification of the vertebral artery V4 segments. No proximal occlusion of the anterior or inferior cerebellar arteries. Basilar artery is normal. Superior cerebellar arteries are normal. 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 Severe stenosis of the left PCA  P1 and P2 segments. There is occlusion of the left P3 segment. ANTERIOR CIRCULATION: Atherosclerotic calcification of the internal carotid arteries at the skull base without hemodynamically significant stenosis. Anterior cerebral arteries are normal. Middle cerebral arteries are normal. Venous sinuses: As permitted by contrast timing, patent. Anatomic variants: None Review of the MIP images confirms the above findings. IMPRESSION: 1. 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 2. Severe stenosis of the left PCA P1 and P2 segments with occlusion of the left P3 segment. 3. Tortuous courses of the vertebral arteries with multifocal narrowing and dilatation, which may be due to fibromuscular dysplasia. 4. No hemodynamically significant stenosis of the carotid arteries. Electronically Signed   By: Juanetta Nordmann M.D.   On: 10/02/2023 22:46    Microbiology: Results for orders placed or performed during the hospital encounter of 04/13/22  Blood Culture (routine x 2)  Status: None   Collection Time: 04/13/22 12:41 PM   Specimen: BLOOD LEFT HAND  Result Value Ref Range Status   Specimen Description   Final    BLOOD LEFT HAND BOTTLES DRAWN AEROBIC AND ANAEROBIC   Special Requests Blood Culture adequate volume  Final   Culture   Final    NO GROWTH 5 DAYS Performed at [facility], [address], [place], [place] [address]    Report Status 04/18/2022 FINAL  Final  Blood Culture (routine x 2)     Status: None   Collection Time: 04/13/22 12:50 PM   Specimen: Right Antecubital; Blood  Result Value Ref Range Status   Specimen Description   Final    RIGHT ANTECUBITAL BOTTLES DRAWN AEROBIC AND ANAEROBIC   Special Requests Blood Culture adequate volume  Final   Culture   Final    NO GROWTH 5 DAYS Performed at [facility], [address] [place], [place] [address]    Report Status 04/18/2022 FINAL  Final  Urine Culture     Status: None   Collection Time: 04/13/22  1:29 PM   Specimen: Urine,  Catheterized  Result Value Ref Range Status   Specimen Description   Final    URINE, CATHETERIZED Performed at [facility], [address] [place], [place] [address]    Special Requests   Final    NONE Performed at [facility], [address] [place], [place] [address]    Culture   Final    NO GROWTH Performed at [facility], [address] [place], [place] [address]    Report Status 04/15/2022 FINAL  Final  Body fluid culture w Gram Stain     Status: None   Collection Time: 04/13/22  3:38 PM   Specimen: KNEE; Body Fluid  Result Value Ref Range Status   Specimen Description   Final    KNEE RIGHT Performed at [facility], [address], [place], [place] [address]    Special Requests   Final    NONE Performed at [facility], [address], [place], [place] [address]    Gram Stain   Final    CYTOSPIN SMEAR NO ORGANISMS SEEN WBC PRESENT, PREDOMINANTLY PMN Performed at [facility], [address], [place], [place] [address]    Culture   Final    NO GROWTH 3 DAYS Performed at [facility], [address], [place], [place] [address]    Report Status 04/17/2022 FINAL  Final  Resp Panel by RT-PCR (Flu A&B, Covid) Anterior Nasal Swab     Status: None   Collection Time: 04/13/22  3:59 PM   Specimen: Anterior Nasal Swab  Result Value Ref Range Status   SARS Coronavirus 2 by RT PCR NEGATIVE NEGATIVE Final    Comment: (NOTE) SARS-CoV-2 target nucleic acids are NOT DETECTED.  The SARS-CoV-2 RNA is generally detectable in upper respiratory specimens during the acute phase of infection. The lowest concentration of SARS-CoV-2 viral copies this assay can detect is 138 copies/mL. A negative result does not preclude SARS-Cov-2 infection and should not be used as the sole basis for treatment or other patient management decisions. A negative result may occur with  improper specimen collection/handling, submission of specimen other than nasopharyngeal swab, presence of  viral mutation(s) within the areas targeted by this assay, and inadequate number of viral copies(<138 copies/mL). A negative result must be combined with clinical observations, patient history, and epidemiological information. The expected result is Negative.  Fact  Sheet for Patients:  BloggerCourse.com  Fact Sheet for Healthcare Providers:  SeriousBroker.it  This test is no t yet approved or cleared by the [place]  FDA and  has been authorized for detection and/or diagnosis of SARS-CoV-2 by FDA under an Emergency Use Authorization (EUA). This EUA will remain  in effect (meaning this test can be used) for the duration of the COVID-19 declaration under Section 564(b)(1) of the Act, 21 U.S.C.section 360bbb-3(b)(1), unless the authorization is terminated  or revoked sooner.       Influenza A by PCR NEGATIVE NEGATIVE Final   Influenza B by PCR NEGATIVE NEGATIVE Final    Comment: (NOTE) The Xpert Xpress SARS-CoV-2/FLU/RSV plus assay is intended as an aid in the diagnosis of influenza from Nasopharyngeal swab specimens and should not be used as a sole basis for treatment. Nasal washings and aspirates are unacceptable for Xpert Xpress SARS-CoV-2/FLU/RSV testing.  Fact Sheet for Patients: BloggerCourse.com  Fact Sheet for Healthcare Providers: SeriousBroker.it  This test is not yet approved or cleared by the [place]  FDA and has been authorized for detection and/or diagnosis of SARS-CoV-2 by FDA under an Emergency Use Authorization (EUA). This EUA will remain in effect (meaning this test can be used) for the duration of the COVID-19 declaration under Section 564(b)(1) of the Act, 21 U.S.C. section 360bbb-3(b)(1), unless the authorization is terminated or revoked.  Performed at [facility], [address], [place], [place] [address]     Labs: CBC: Recent Labs  Lab  10/02/23 1526 10/02/23 1534 10/03/23 0416  WBC 8.3  --  7.1  NEUTROABS 4.1  --   --   HGB 12.5 12.9 11.8*  HCT 37.3 38.0 35.1*  MCV 86.9  --  85.8  PLT 188  --  161   Basic Metabolic Panel: Recent Labs  Lab 10/02/23 1526 10/02/23 1534 10/03/23 0416 10/04/23 0353  NA 133* 137 136 136  K 4.0 4.1 3.9 3.8  CL 99 100 103 103  CO2 26  --  26 25  GLUCOSE 131* 131* 108* 122*  BUN 12 11 11 11   CREATININE 1.22* 1.20* 1.11* 1.06*  CALCIUM  9.5  --  9.1 9.0  MG  --   --  1.9 1.8  PHOS  --   --  3.1  --    Liver Function Tests: Recent Labs  Lab 10/02/23 1526 10/03/23 0416  AST 23 21  ALT 13 12  ALKPHOS 128* 110  BILITOT 1.2 1.7*  PROT 6.8 6.0*  ALBUMIN 3.4* 3.0*   CBG: Recent Labs  Lab 10/03/23 1958 10/04/23 0005 10/04/23 0359 10/04/23 0750 10/04/23 1111  GLUCAP 168* 103* 130* 137* 158*    Discharge time spent: greater than 30 minutes.  Signed: Demaris Fillers, MD [facility] 10/04/2023

## 2023-10-04 NOTE — Progress Notes (Signed)
 Patient states things are no longer getting caught in her throat.  Tolerating diet.  Does not like the food.   Vital signs in last 24 hours: Temp:  [98 F (36.7 C)-98.9 F (37.2 C)] 98.9 F (37.2 C) (05/17 0356) Pulse Rate:  [60-81] 63 (05/17 0356) Resp:  [16-21] 18 (05/17 0356) BP: (132-180)/(57-83) 133/57 (05/17 0356) SpO2:  [92 %-100 %] 92 % (05/17 0356) Weight:  [69.9 kg] 69.9 kg (05/16 1557) Last BM Date : 10/02/23 (per pt) General:     Pleasant elderly lady resting comfortably no acute distress. Abdomen:  Soft, nontender and nondistended.  Normal bowel sounds, without guarding, and without rebound.  No mass or organomegaly.     Intake/Output from previous day: 05/16 0701 - 05/17 0700 In: 755.5 [P.O.:300; I.V.:455.5] Out: -  Intake/Output this shift: No intake/output data recorded.  Lab Results: Recent Labs    10/02/23 1526 10/02/23 1534 10/03/23 0416  WBC 8.3  --  7.1  HGB 12.5 12.9 11.8*  HCT 37.3 38.0 35.1*  PLT 188  --  161   BMET Recent Labs    10/02/23 1526 10/02/23 1534 10/03/23 0416 10/04/23 0353  NA 133* 137 136 136  K 4.0 4.1 3.9 3.8  CL 99 100 103 103  CO2 26  --  26 25  GLUCOSE 131* 131* 108* 122*  BUN 12 11 11 11   CREATININE 1.22* 1.20* 1.11* 1.06*  CALCIUM  9.5  --  9.1 9.0   LFT Recent Labs    10/03/23 0416  PROT 6.0*  ALBUMIN 3.0*  AST 21  ALT 12  ALKPHOS 110  BILITOT 1.7*   PT/INR No results for input(s): "LABPROT", "INR" in the last 72 hours. Hepatitis Panel No results for input(s): "HEPBSAG", "HCVAB", "HEPAIGM", "HEPBIGM" in the last 72 hours. C-Diff No results for input(s): "CDIFFTOX" in the last 72 hours.  Studies/Results: ECHOCARDIOGRAM COMPLETE Result Date: 10/03/2023    ECHOCARDIOGRAM REPORT   Patient Name:   Traci Clark Date of Exam: 10/03/2023 Medical Rec #:  [id number]          Height:       66.0 in Accession #:    [id number]         Weight:       158.1 lb Date of Birth:  09-Sep-1930          BSA:          1.810  m Patient Age:    89 years           BP:           144/68 mmHg Patient Gender: F                  HR:           60 bpm. Exam Location:  [facility] Procedure: 2D Echo, Cardiac Doppler and Color Doppler (Both Spectral and Color            Flow Doppler were utilized during procedure). Indications:    Syncope R55  History:        Patient has prior history of Echocardiogram examinations, most                 recent 05/30/2022. Pacemaker, Arrythmias:First degree AV block,                 Signs/Symptoms:Syncope and Palpitations; Risk                 Factors:Hypertension, Diabetes and CKD.  Sonographer:    Denese Finn RCS Referring Phys: [id number] OLADAPO ADEFESO IMPRESSIONS  1. Left ventricular ejection fraction, by estimation, is 60 to 65%. The left ventricle has normal function. The left ventricle has no regional wall motion abnormalities. There is severe left ventricular hypertrophy. Left ventricular diastolic parameters  are consistent with Grade I diastolic dysfunction (impaired relaxation).  2. Right ventricular systolic function is normal. The right ventricular size is normal. Tricuspid regurgitation signal is inadequate for assessing PA pressure.  3. The mitral valve is normal in structure. Trivial mitral valve regurgitation. No evidence of mitral stenosis.  4. The tricuspid valve is abnormal.  5. The aortic valve is tricuspid. There is mild calcification of the aortic valve. There is mild thickening of the aortic valve. Aortic valve regurgitation is not visualized. No aortic stenosis is present.  6. The inferior vena cava is normal in size with greater than 50% respiratory variability, suggesting right atrial pressure of 3 mmHg. FINDINGS  Left Ventricle: Left ventricular ejection fraction, by estimation, is 60 to 65%. The left ventricle has normal function. The left ventricle has no regional wall motion abnormalities. The left ventricular internal cavity size was normal in size. There is  severe left ventricular  hypertrophy. Left ventricular diastolic parameters are consistent with Grade I diastolic dysfunction (impaired relaxation). Normal left ventricular filling pressure. Right Ventricle: The right ventricular size is normal. Right vetricular wall thickness was not well visualized. Right ventricular systolic function is normal. Tricuspid regurgitation signal is inadequate for assessing PA pressure. Left Atrium: Left atrial size was normal in size. Right Atrium: Right atrial size was normal in size. Pericardium: There is no evidence of pericardial effusion. Presence of epicardial fat layer. Mitral Valve: The mitral valve is normal in structure. Trivial mitral valve regurgitation. No evidence of mitral valve stenosis. Tricuspid Valve: The tricuspid valve is abnormal. Tricuspid valve regurgitation is trivial. No evidence of tricuspid stenosis. Aortic Valve: The aortic valve is tricuspid. There is mild calcification of the aortic valve. There is mild thickening of the aortic valve. There is mild aortic valve annular calcification. Aortic valve regurgitation is not visualized. No aortic stenosis  is present. Aortic valve mean gradient measures 2.0 mmHg. Aortic valve peak gradient measures 3.6 mmHg. Aortic valve area, by VTI measures 3.04 cm. Pulmonic Valve: The pulmonic valve was not well visualized. Pulmonic valve regurgitation is not visualized. No evidence of pulmonic stenosis. Aorta: The aortic root and ascending aorta are structurally normal, with no evidence of dilitation. Venous: The inferior vena cava is normal in size with greater than 50% respiratory variability, suggesting right atrial pressure of 3 mmHg. IAS/Shunts: No atrial level shunt detected by color flow Doppler.  LEFT VENTRICLE PLAX 2D LVIDd:         4.60 cm   Diastology LVIDs:         2.60 cm   LV e' medial:    3.97 cm/s LV PW:         1.40 cm   LV E/e' medial:  14.1 LV IVS:        1.70 cm   LV e' lateral:   6.16 cm/s LVOT diam:     2.20 cm   LV E/e'  lateral: 9.1 LV SV:         62 LV SV Index:   34 LVOT Area:     3.80 cm  RIGHT VENTRICLE RV S prime:     9.89 cm/s TAPSE (M-mode): 1.6 cm LEFT ATRIUM  Index        RIGHT ATRIUM           Index LA diam:        3.60 cm 1.99 cm/m   RA Area:     20.90 cm LA Vol (A2C):   43.5 ml 24.04 ml/m  RA Volume:   59.70 ml  32.99 ml/m LA Vol (A4C):   34.4 ml 19.01 ml/m LA Biplane Vol: 40.7 ml 22.49 ml/m  AORTIC VALVE AV Area (Vmax):    2.87 cm AV Area (Vmean):   2.63 cm AV Area (VTI):     3.04 cm AV Vmax:           95.36 cm/s AV Vmean:          67.729 cm/s AV VTI:            0.205 m AV Peak Grad:      3.6 mmHg AV Mean Grad:      2.0 mmHg LVOT Vmax:         72.10 cm/s LVOT Vmean:        46.800 cm/s LVOT VTI:          0.164 m LVOT/AV VTI ratio: 0.80  AORTA Ao Root diam: 3.40 cm Ao Asc diam:  3.40 cm MITRAL VALVE MV Area (PHT): 3.12 cm    SHUNTS MV Decel Time: 243 msec    Systemic VTI:  0.16 m MV E velocity: 55.90 cm/s  Systemic Diam: 2.20 cm MV A velocity: 90.10 cm/s MV E/A ratio:  0.62 Armida Lander MD Electronically signed by Armida Lander MD Signature Date/Time: 10/03/2023/2:53:51 PM    Final    CT Angio Head Neck W WO CM Result Date: 10/02/2023 CLINICAL DATA:  Syncope EXAM: CT ANGIOGRAPHY HEAD AND NECK WITH AND WITHOUT CONTRAST TECHNIQUE: Multidetector CT imaging of the head and neck was performed using the standard protocol during bolus administration of intravenous contrast. Multiplanar CT image reconstructions and MIPs were obtained to evaluate the vascular anatomy. Carotid stenosis measurements (when applicable) are obtained utilizing NASCET criteria, using the distal internal carotid diameter as the denominator. RADIATION DOSE REDUCTION: This exam was performed according to the departmental dose-optimization program which includes automated exposure control, adjustment of the mA and/or kV according to patient size and/or use of iterative reconstruction technique. CONTRAST:  60mL OMNIPAQUE  IOHEXOL   350 MG/ML SOLN COMPARISON:  None Available. FINDINGS: CT HEAD FINDINGS Brain: There is no mass, hemorrhage or extra-axial collection. The size and configuration of the ventricles and extra-axial CSF spaces are normal. There is hypoattenuation of the white matter, most commonly indicating chronic small vessel disease. 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 Vascular: No hyperdense vessel or unexpected vascular calcification. Skull: The visualized skull base, calvarium and extracranial soft tissues are normal. Sinuses/Orbits: No fluid levels or advanced mucosal thickening of the visualized paranasal sinuses. No mastoid or middle ear effusion. Normal orbits. CTA NECK FINDINGS Skeleton: No acute abnormality or high grade bony spinal canal stenosis. Other neck: Normal pharynx, larynx and major salivary glands. No cervical lymphadenopathy. Unremarkable thyroid  gland. Upper chest: No pneumothorax or pleural effusion. No nodules or masses. Aortic arch: There is no calcific atherosclerosis of the aortic arch. Conventional 3 vessel aortic branching pattern. RIGHT carotid system: No dissection, occlusion or aneurysm. Mild atherosclerotic calcification at the carotid bifurcation without hemodynamically significant stenosis. LEFT carotid system: Normal without aneurysm, dissection or stenosis. Vertebral arteries: Left dominant configuration. Mild multifocal atherosclerosis of the left vertebral artery V2 segment without hemodynamically significant stenosis.  The left vertebral artery has a tortuous course with multifocal narrowing and dilatation. Mild irregularity of the right vertebral artery without dissection or hemodynamically significant stenosis. CTA HEAD FINDINGS POSTERIOR CIRCULATION: Mild atherosclerotic calcification of the vertebral artery V4 segments. No proximal occlusion of the anterior or inferior cerebellar arteries. Basilar artery is normal. Superior cerebellar arteries are normal. Occlusion of the proximal P3  segment of the right PCA. Severe stenosis of the left PCA P1 and P2 segments. There is occlusion of the left P3 segment. ANTERIOR CIRCULATION: Atherosclerotic calcification of the internal carotid arteries at the skull base without hemodynamically significant stenosis. Anterior cerebral arteries are normal. Middle cerebral arteries are normal. Venous sinuses: As permitted by contrast timing, patent. Anatomic variants: None Review of the MIP images confirms the above findings. IMPRESSION: 1. 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 2. Severe stenosis of the left PCA P1 and P2 segments with occlusion of the left P3 segment. 3. Tortuous courses of the vertebral arteries with multifocal narrowing and dilatation, which may be due to fibromuscular dysplasia. 4. No hemodynamically significant stenosis of the carotid arteries. Electronically Signed   By: Juanetta Nordmann M.D.   On: 10/02/2023 22:46    Impression:   89 year old lady with recent crescendo pill and esophageal dysphagia to foods.  Presented with signs and symptoms suspicious for pill impaction/pill induced esophagitis.  EGD yesterday demonstrated a cervical web and a Schatzki's ring.  Both nicely dilated.   No evidence of pill induced esophagitis or other inflammatory process. Swallowing better all ready.    Recommendations:  -   Continue once daily PPI  -   advance diet as tolerated  -   follow-up with Dr. Sammi Crick in 3 months  -   Home soon as discussed with Dr. Winferd Hatter this morning.       LOS: 1 day   Garnette Ka  10/04/2023, 9:20 AM

## 2023-10-04 NOTE — Progress Notes (Signed)
 Patient IV infiltrated. Redness noted to site. Warm compress applied. Pharmacy made aware.

## 2023-10-04 NOTE — Progress Notes (Signed)
 Patient discharging home. Grandson to transport patient. Discharge instructions provided to patient. No concerns.

## 2023-10-04 NOTE — Plan of Care (Signed)
   Problem: Education: Goal: Knowledge of General Education information will improve Description Including pain rating scale, medication(s)/side effects and non-pharmacologic comfort measures Outcome: Progressing   Problem: Health Behavior/Discharge Planning: Goal: Ability to manage health-related needs will improve Outcome: Progressing

## 2023-10-04 NOTE — Op Note (Signed)
 [facility] Patient Name: Traci Clark Procedure Date: 10/03/2023 4:27 PM MRN: [id number] Date of Birth: April 01, 1931 Attending MD: Gemma Kelp , MD, [id number] CSN: [id number] Age: 89 Admit Type: Inpatient Procedure:                Upper GI endoscopy Indications:              Dysphagia Providers:                Gemma Kelp, MD, Crystal Page, Sharlette Dayhoff                            Technician, Technician Referring MD:              Medicines:                Propofol  per Anesthesia Complications:            No immediate complications. Estimated Blood Loss:     Estimated blood loss was minimal. Procedure:                Pre-Anesthesia Assessment:                           - Prior to the procedure, a History and Physical                            was performed, and patient medications and                            allergies were reviewed. The patient's tolerance of                            previous anesthesia was also reviewed. The risks                            and benefits of the procedure and the sedation                            options and risks were discussed with the patient.                            All questions were answered, and informed consent                            was obtained. Prior Anticoagulants: The patient has                            taken no anticoagulant or antiplatelet agents. ASA                            Grade Assessment: III - A patient with severe                            systemic disease. After reviewing the risks and  benefits, the patient was deemed in satisfactory                            condition to undergo the procedure.                           After obtaining informed consent, the endoscope was                            passed under direct vision. Throughout the                            procedure, the patient's blood pressure, pulse, and                            oxygen  saturations  were monitored continuously. The                            [id number]-[id number] ([id number]) scope was introduced through the                            mouth, and advanced to the second part of duodenum.                            The patient tolerated the procedure well. Scope In: 4:59:41 PM Scope Out: 5:08:14 PM Total Procedure Duration: 0 hours 8 minutes 33 seconds  Findings:      Probable cervical esophageal web and a Schatzki's ring found tubular       esophagus remain patent passage of the adult gastroscope small hiatal       hernia No ulcer or infiltrating process. Patent pylorus. Scope was       withdrawn. A 54 French Maloney dilator passed to full insertion; both       cervical web and Schatzki's ring felt to been dilated with complete       insertion of mild resistance. A look back revealed superficial tear       through the UES and at the EG junction indicaing good dilation. Impression:               - Tandem cervical esophageal web and Schatzki's                            ring dilated as described above                           - Small hiatal hernia. Gastric polyp removed as                            described above Moderate Sedation:      Moderate (conscious) sedation was personally administered by an       anesthesia professional. The following parameters were monitored: oxygen        saturation, heart rate, blood pressure, respiratory rate, EKG, adequacy       of pulmonary ventilation, and response to care. Recommendation:           - Return patient to hospital ward  for possible                            discharge same day.                           - Advance diet as tolerated. Once daily PPI                           - Continue present medications. - Return to my                            office in 3 months. Procedure Code(s):        --- Professional ---                           [phone], Esophagogastroduodenoscopy, flexible,                            transoral; diagnostic,  including collection of                            specimen(s) by brushing or washing, when performed                            (separate procedure) Diagnosis Code(s):        --- Professional ---                           R13.10, Dysphagia, unspecified CPT copyright 2022 [employer]. All rights reserved. The codes documented in this report are preliminary and upon coder review may  be revised to meet current compliance requirements. Windsor Hatcher. Moana Munford, MD Gemma Kelp, MD 10/04/2023 10:33:30 AM This report has been signed electronically. Number of Addenda: 0

## 2023-10-06 LAB — GLUCOSE, CAPILLARY: Glucose-Capillary: 97 mg/dL (ref 70–99)

## 2023-10-07 ENCOUNTER — Encounter (HOSPITAL_COMMUNITY): Payer: Self-pay | Admitting: Internal Medicine

## 2023-10-07 LAB — SURGICAL PATHOLOGY

## 2023-10-10 NOTE — Progress Notes (Signed)
 Remote pacemaker transmission.

## 2023-10-10 NOTE — Addendum Note (Signed)
 Addended by: Lott Rouleau A on: 10/10/2023 02:28 PM   Modules accepted: Orders

## 2023-10-15 ENCOUNTER — Ambulatory Visit: Payer: Self-pay | Admitting: Internal Medicine

## 2023-11-11 ENCOUNTER — Ambulatory Visit (INDEPENDENT_AMBULATORY_CARE_PROVIDER_SITE_OTHER): Admitting: Gastroenterology

## 2023-11-11 ENCOUNTER — Encounter (INDEPENDENT_AMBULATORY_CARE_PROVIDER_SITE_OTHER): Payer: Self-pay | Admitting: Gastroenterology

## 2023-11-11 VITALS — BP 148/76 | HR 65 | Temp 97.7°F | Ht 66.0 in | Wt 158.8 lb

## 2023-11-11 DIAGNOSIS — K5900 Constipation, unspecified: Secondary | ICD-10-CM

## 2023-11-11 DIAGNOSIS — Z8719 Personal history of other diseases of the digestive system: Secondary | ICD-10-CM

## 2023-11-11 DIAGNOSIS — R1030 Lower abdominal pain, unspecified: Secondary | ICD-10-CM | POA: Diagnosis not present

## 2023-11-11 DIAGNOSIS — K219 Gastro-esophageal reflux disease without esophagitis: Secondary | ICD-10-CM | POA: Diagnosis not present

## 2023-11-11 DIAGNOSIS — K581 Irritable bowel syndrome with constipation: Secondary | ICD-10-CM

## 2023-11-11 NOTE — Patient Instructions (Signed)
 Increase water  intake, aim for atleast 64 oz per day Increase fruits, veggies and whole grains, kiwi and prunes are especially good for constipation Continue taking miralax  as you are doing Continue dexilant 60mg  daily Let me know if you have any new or worsening GI issues  Follow up 6 months  It was a pleasure to see you today. I want to create trusting relationships with patients and provide genuine, compassionate, and quality care. I truly value your feedback! please be on the lookout for a survey regarding your visit with me today. I appreciate your input about our visit and your time in completing this!    Shaquandra Galano L. Niesha Bame, MSN, APRN, AGNP-C Adult-Gerontology Nurse Practitioner [facility]

## 2023-11-11 NOTE — Progress Notes (Addendum)
 Referring Provider: Jolee Elsie RAMAN, PA Primary Care Physician:  Jolee Elsie RAMAN, PA Primary GI Physician: Dr. Eartha   Chief Complaint  Patient presents with   Follow-up    Pt arrives for follow up. Pt states she is still having some abdominal pain but not like it was. Abdominal pain doesn't affect her sleep at night. Pt has been short winded since she came out of hospital in May. Pt had EGD in May by Dr.Rourk-does she need a follow up EGD?   HPI:   Traci Clark is a 89 y.o. female with past medical history of CKD, history of colon cancer status post resection, diabetes, IBS, GERD, seizures, glaucoma, anxiety, DVT, gout    Patient presenting today for:  Hospital follow up for dysphagia Lower abdominal pain and constipation  GERD  Presented to the ED on 5/16 with c/o dysphagia, vomiting, weight loss, decreased appetite. Endorsed feeling a pill was stuck in her throat.  She underwent EGD on 5/16 with tandem cervical esophageal web/schatzkis ring, dilated, small hh.  Recent labs: Cortisol am: 5/17 10, hgb 11.8 , T bili 1.6, albumin 3   Present:  States dysphagia has resolved since EGD with dilation. Appetite is okay but still not great. Her bowels are moving relatively well on miralax ,  usually will take it every other day which will keep things moving regularly. Occasionally will skip a few days in between of having a BM. She notes stools are chronically darker but no rectal bleeding. Abdominal pain is improved since she left the hospital, not having as much issues with this recently, especially improved at night. denies association of pain to eating. She has had some nausea yesterday but no vomiting. Currently on dexilant 60mg   daily. Weight stable since April.   CT A/P with contrast: 08/2023 No acute abnormality seen in the abdomen or pelvis.  Last Colonoscopy:07/21/20 - presence of a patent anastomosis in the ascending colon with normal mucosa, -3 polyps between 2 to 5 mm  were removed from the ascending colon and descending colon. Last Endoscopy:-09/2023 Tandem cervical esophageal web and Schatzki's                            ring dilated as described above                           - Small hiatal hernia. Gastric polyp removed as                            described above (fundic gland polyp, biopsies negative otherwise)   Past Medical History:  Diagnosis Date   Anxiety    Arthritis    Chronic constipation    CKD (chronic kidney disease), stage III (HCC)    Colon cancer (HCC)    colon   Complication of anesthesia    Diabetes mellitus without complication (HCC)    DVT (deep vein thrombosis) in pregnancy    Dysphagia    GERD (gastroesophageal reflux disease)    Glaucoma    Gout    H/O hiatal hernia    History of kidney stones    Iliac artery aneurysm, bilateral (HCC)    MI (myocardial infarction) (HCC) 1993   PONV (postoperative nausea and vomiting)    Restless leg syndrome    Seizure (HCC)    withdrawal from xanax    Vertigo  Past Surgical History:  Procedure Laterality Date   BIOPSY  07/21/2020   Procedure: BIOPSY;  Surgeon: Eartha Angelia Sieving, MD;  [facility];  Service: Gastroenterology;;   BIOPSY  03/26/2022   Procedure: BIOPSY;  Surgeon: Eartha Angelia Sieving, MD;  [facility];  Service: Gastroenterology;;   CHOLECYSTECTOMY     COLECTOMY  2003   COLONOSCOPY N/A 07/29/2012   Procedure: COLONOSCOPY;  Surgeon: Claudis RAYMOND Rivet, MD;  [facility];  Service: Endoscopy;  Laterality: N/A;  100   COLONOSCOPY WITH PROPOFOL  N/A 12/13/2016   Procedure: COLONOSCOPY WITH PROPOFOL ;  Surgeon: Rivet Claudis RAYMOND, MD;  [facility];  Service: Endoscopy;  Laterality: N/A;  8:30   COLONOSCOPY WITH PROPOFOL  N/A 07/21/2020   Procedure: COLONOSCOPY WITH PROPOFOL ;  Surgeon: Eartha Angelia Sieving, MD;  [facility];  Service: Gastroenterology;  Laterality: N/A;  AM   ESOPHAGEAL DILATION   11/03/2015   Procedure: ESOPHAGEAL DILATION;  Surgeon: Claudis RAYMOND Rivet, MD;  [facility];  Service: Endoscopy;;   ESOPHAGEAL DILATION N/A 09/26/2017   Procedure: ESOPHAGEAL DILATION;  Surgeon: Rivet Claudis RAYMOND, MD;  [facility];  Service: Endoscopy;  Laterality: N/A;   ESOPHAGEAL DILATION N/A 10/03/2023   Procedure: DILATION, ESOPHAGUS;  Surgeon: Shaaron Lamar HERO, MD;  [facility];  Service: Endoscopy;  Laterality: N/A;   ESOPHAGOGASTRODUODENOSCOPY N/A 03/25/2014   Procedure: ESOPHAGOGASTRODUODENOSCOPY (EGD);  Surgeon: Claudis RAYMOND Rivet, MD;  [facility];  Service: Endoscopy;  Laterality: N/A;  1055   ESOPHAGOGASTRODUODENOSCOPY N/A 11/03/2015   Procedure: ESOPHAGOGASTRODUODENOSCOPY (EGD);  Surgeon: Claudis RAYMOND Rivet, MD;  [facility];  Service: Endoscopy;  Laterality: N/A;  1:55 - moved to 11:15 - Ann to notify - moved to 12:45 - pt knows to arrive at 11:45   ESOPHAGOGASTRODUODENOSCOPY N/A 09/26/2017   Procedure: ESOPHAGOGASTRODUODENOSCOPY (EGD);  Surgeon: Rivet Claudis RAYMOND, MD;  [facility];  Service: Endoscopy;  Laterality: N/A;   ESOPHAGOGASTRODUODENOSCOPY N/A 10/03/2023   Procedure: EGD (ESOPHAGOGASTRODUODENOSCOPY);  Surgeon: Shaaron Lamar HERO, MD;  [facility];  Service: Endoscopy;  Laterality: N/A;   ESOPHAGOGASTRODUODENOSCOPY (EGD) WITH PROPOFOL  N/A 07/21/2020   Procedure: ESOPHAGOGASTRODUODENOSCOPY (EGD) WITH PROPOFOL ;  Surgeon: Eartha Angelia Sieving, MD;  [facility];  Service: Gastroenterology;  Laterality: N/A;   ESOPHAGOGASTRODUODENOSCOPY (EGD) WITH PROPOFOL  N/A 03/26/2022   Procedure: ESOPHAGOGASTRODUODENOSCOPY (EGD) WITH PROPOFOL ;  Surgeon: Eartha Angelia Sieving, MD;  [facility];  Service: Gastroenterology;  Laterality: N/A;  1215 ASA 3   MALONEY DILATION N/A 03/25/2014   Procedure: MALONEY DILATION;  Surgeon: Claudis RAYMOND Rivet, MD;  [facility];  Service: Endoscopy;   Laterality: N/A;   PACEMAKER IMPLANT N/A 08/27/2022   Procedure: PACEMAKER IMPLANT;  Surgeon: Nancey Eulas BRAVO, MD;  [facility];  Service: Cardiovascular;  Laterality: N/A;   POLYPECTOMY  12/13/2016   Procedure: POLYPECTOMY;  Surgeon: Rivet Claudis RAYMOND, MD;  [facility];  Service: Endoscopy;;  polyp   POLYPECTOMY  07/21/2020   Procedure: POLYPECTOMY;  Surgeon: Eartha Angelia, Sieving, MD;  [facility];  Service: Gastroenterology;;   TOTAL HIP ARTHROPLASTY Left    TUBAL LIGATION     VEIN SURGERY Bilateral    stripped vein due to DVT    Current Outpatient Medications  Medication Sig Dispense Refill   acetaminophen  (TYLENOL ) 500 MG tablet Take 1,000 mg by mouth every 6 (six) hours as needed (pain.). Gelcaps  albuterol  (VENTOLIN  HFA) 108 (90 Base) MCG/ACT inhaler Inhale 1-2 puffs into the lungs every 6 (six) hours as needed for shortness of breath or wheezing.     allopurinol  (ZYLOPRIM ) 100 MG tablet Take 100 mg by mouth in the morning.     ALPRAZolam  (XANAX ) 0.5 MG tablet Take 0.5 mg by mouth 3 (three) times daily as needed for anxiety.     clobetasol ointment (TEMOVATE) 0.05 % Apply 1 Application topically 2 (two) times daily as needed (irritation.).     dexlansoprazole (DEXILANT) 60 MG capsule Take 60 mg by mouth daily before breakfast.     fluticasone (FLONASE) 50 MCG/ACT nasal spray Place 2 sprays into both nostrils daily as needed (allergies (cough/sneezing)).     furosemide  (LASIX ) 20 MG tablet Take 20 mg by mouth in the morning.     insulin  aspart (NOVOLOG  FLEXPEN RELION) 100 UNIT/ML FlexPen Inject 4-10 Units into the skin 3 (three) times daily with meals. Per sliding scale >150 = 4 units 200-250 = 6 units 251-350 = 8 units >351 = 10 units     Insulin  Glargine (BASAGLAR  KWIKPEN) 100 UNIT/ML Inject 20 Units into the skin at bedtime.     latanoprost  (XALATAN ) 0.005 % ophthalmic solution Place 1 drop into both eyes at bedtime.     LUTEIN PO  Take 40 mg by mouth daily.     meclizine  (ANTIVERT ) 25 MG tablet Take 1 tablet (25 mg total) by mouth 3 (three) times daily as needed for dizziness. 30 tablet 0   Menthol , Topical Analgesic, (ICY HOT BACK EXTRA STRENGTH) 5 % PTCH Place 1 patch onto the skin daily as needed (pain.).     nystatin  cream (MYCOSTATIN ) Apply 1 Application topically 2 (two) times daily.     ondansetron  (ZOFRAN ) 4 MG tablet Take 4 mg by mouth every 8 (eight) hours as needed for nausea or vomiting. Dissolving tablet     phenazopyridine (PYRIDIUM) 200 MG tablet Take 200 mg by mouth 3 (three) times daily as needed for pain.     polyethylene glycol (MIRALAX  / GLYCOLAX ) packet Take 34 g by mouth in the morning.     potassium chloride (MICRO-K) 10 MEQ CR capsule Take 10 mEq by mouth in the morning.     tacrolimus (PROTOPIC) 0.1 % ointment Apply 1 Application topically 2 (two) times daily.     traMADol  (ULTRAM ) 50 MG tablet Take 1 tablet (50 mg total) by mouth every 6 (six) hours as needed for severe pain. 10 tablet 0   triamcinolone ointment (KENALOG) 0.1 % Apply 1 Application topically daily.     No current facility-administered medications for this visit.    Allergies as of 11/11/2023 - Review Complete 11/11/2023  Allergen Reaction Noted   Amoxil [amoxicillin] Anaphylaxis and Rash 10/31/2015   Macrobid [nitrofurantoin monohyd macro] Anaphylaxis, Rash, and Other (See Comments) 10/31/2015   Codeine Nausea And Vomiting and Other (See Comments) 07/14/2012   Darvon [propoxyphene] Nausea And Vomiting and Other (See Comments) 06/27/2014   Phenergan  [promethazine ] Other (See Comments) 02/28/2018   Pravachol [pravastatin] Other (See Comments) 09/22/2015   Rocephin  [ceftriaxone ] Other (See Comments) 11/13/2020   Sulfa antibiotics Other (See Comments) 05/29/2022   Hydrocodone-acetaminophen  Nausea And Vomiting and Other (See Comments) 02/04/2012   Mirtazapine Other (See Comments) 03/15/2022   Neurontin  [gabapentin ] Nausea Only  12/21/2020    Social History   Socioeconomic History   Marital status: Married    Spouse name: Not on file   Number of children: Not on file  Years of education: Not on file   Highest education level: Not on file  Occupational History   Not on file  Tobacco Use   Smoking status: Never    Passive exposure: Never   Smokeless tobacco: Never  Vaping Use   Vaping status: Never Used  Substance and Sexual Activity   Alcohol use: No    Alcohol/week: 0.0 standard drinks of alcohol   Drug use: No   Sexual activity: Not Currently    Birth control/protection: Surgical  Other Topics Concern   Not on file  Social History Narrative   Not on file   Social Drivers of Health   Financial Resource Strain: Not on file  Food Insecurity: No Food Insecurity (10/03/2023)   Hunger Vital Sign    Worried About Running Out of Food in the Last Year: Never true    Ran Out of Food in the Last Year: Never true  Transportation Needs: No Transportation Needs (10/03/2023)   PRAPARE - Administrator, Civil Service (Medical): No    Lack of Transportation (Non-Medical): No  Physical Activity: Not on file  Stress: Not on file  Social Connections: Moderately Integrated (10/03/2023)   Social Connection and Isolation Panel    Frequency of Communication with Friends and Family: More than three times a week    Frequency of Social Gatherings with Friends and Family: More than three times a week    Attends Religious Services: More than 4 times per year    Active Member of [employer] or Organizations: Yes    Attends Banker Meetings: More than 4 times per year    Marital Status: Widowed    Review of systems General: negative for malaise, night sweats, fever, chills, weight loss Neck: Negative for lumps, goiter, pain and significant neck swelling Resp: Negative for cough, wheezing, dyspnea at rest CV: Negative for chest pain, leg swelling, palpitations, orthopnea GI: denies melena,  hematochezia, vomiting, diarrhea, constipation, dysphagia, odyonophagia, early satiety or unintentional weight loss. +occasional nausea +occasional lower abdominal pain  The remainder of the review of systems is noncontributory.  Physical Exam: BP (!) 148/76   Pulse 65   Temp 97.7 F (36.5 C)   Ht 5' 6 (1.676 m)   Wt 158 lb 12.8 oz (72 kg)   BMI 25.63 kg/m  General:   Alert and oriented. No distress noted. Pleasant and cooperative.  Head:  Normocephalic and atraumatic. Eyes:  Conjuctiva clear without scleral icterus. Mouth:  Oral mucosa pink and moist. Good dentition. No lesions. Heart: Normal rate and rhythm, s1 and s2 heart sounds present.  Lungs: Clear lung sounds in all lobes. Respirations equal and unlabored. Abdomen:  +BS, soft, non-tender and non-distended. No rebound or guarding. No HSM or masses noted. Neurologic:  Alert and  oriented x4 Psych:  Alert and cooperative. Normal mood and affect.  Invalid input(s): 6 MONTHS   ASSESSMENT: Traci Clark is a 89 y.o. female presenting today for follow up of GERD, dysphagia, lower abdominal pain and constipation   GERD/Dysphagia: GERD well controlled on dexilant 60mg  daily. Dysphagia resolved after EGD with dilation of esophageal web and schatzki's ring. Will continue with PPI daily, good reflux precautions  Lower abdominal pain and constipation: ongoing lower abdominal pain for the past 3 years, thought previously to be influenced by constipation. Multiple CT scans with no acute findings, last TCS in 2022 as outlined above. Constipation has improved with use of miralax  which seems to have improved her pain as  well. Suspect some underlying IBS. No rectal bleeding. Weight has fluctuated some over the past 2 years though she has remained between 147 and 163 with most recent weight being 158. No association of pain to eating, low suspicion for ischemic process.    PLAN:  -continue miralax  every other day -continue dexilant 60mg   daily -Increase water  intake, aim for atleast 64 oz per day -Increase fruits, veggies and whole grains, kiwi and prunes are especially good for constipation  All questions were answered, patient verbalized understanding and is in agreement with plan as outlined above.    Follow Up: 6 months   Traci Clark L. Mariette, MSN, APRN, AGNP-C Adult-Gerontology Nurse Practitioner [facility]  I have reviewed the note and agree with the APP's assessment as described in this progress note  Toribio Fortune, MD Gastroenterology and Hepatology [facility] Gastroenterology

## 2023-11-26 ENCOUNTER — Ambulatory Visit (INDEPENDENT_AMBULATORY_CARE_PROVIDER_SITE_OTHER): Payer: Medicare HMO

## 2023-11-26 DIAGNOSIS — I4430 Unspecified atrioventricular block: Secondary | ICD-10-CM | POA: Diagnosis not present

## 2023-11-27 LAB — CUP PACEART REMOTE DEVICE CHECK
Battery Remaining Longevity: 107 mo
Battery Remaining Percentage: 89 %
Battery Voltage: 2.99 V
Brady Statistic AP VP Percent: 28 %
Brady Statistic AP VS Percent: 1.3 %
Brady Statistic AS VP Percent: 27 %
Brady Statistic AS VS Percent: 43 %
Brady Statistic RA Percent Paced: 29 %
Brady Statistic RV Percent Paced: 55 %
Date Time Interrogation Session: 20250709043015
Implantable Lead Connection Status: 753985
Implantable Lead Connection Status: 753985
Implantable Lead Implant Date: 20240409
Implantable Lead Implant Date: 20240409
Implantable Lead Location: 753859
Implantable Lead Location: 753860
Implantable Pulse Generator Implant Date: 20240409
Lead Channel Impedance Value: 400 Ohm
Lead Channel Impedance Value: 430 Ohm
Lead Channel Pacing Threshold Amplitude: 0.75 V
Lead Channel Pacing Threshold Amplitude: 0.75 V
Lead Channel Pacing Threshold Pulse Width: 0.5 ms
Lead Channel Pacing Threshold Pulse Width: 0.5 ms
Lead Channel Sensing Intrinsic Amplitude: 12 mV
Lead Channel Sensing Intrinsic Amplitude: 4.1 mV
Lead Channel Setting Pacing Amplitude: 1 V
Lead Channel Setting Pacing Amplitude: 2 V
Lead Channel Setting Pacing Pulse Width: 0.5 ms
Lead Channel Setting Sensing Sensitivity: 2 mV
Pulse Gen Model: 2272
Pulse Gen Serial Number: 8169291

## 2023-12-01 ENCOUNTER — Ambulatory Visit: Payer: Self-pay | Admitting: Cardiovascular Disease

## 2024-01-23 ENCOUNTER — Ambulatory Visit: Payer: Medicare HMO | Attending: Cardiovascular Disease | Admitting: Cardiovascular Disease

## 2024-01-23 ENCOUNTER — Encounter: Payer: Self-pay | Admitting: Cardiovascular Disease

## 2024-01-23 ENCOUNTER — Ambulatory Visit: Payer: Self-pay | Admitting: Cardiovascular Disease

## 2024-01-23 VITALS — BP 130/70 | HR 77 | Ht 66.0 in | Wt 156.0 lb

## 2024-01-23 DIAGNOSIS — I10 Essential (primary) hypertension: Secondary | ICD-10-CM | POA: Diagnosis not present

## 2024-01-23 DIAGNOSIS — Z950 Presence of cardiac pacemaker: Secondary | ICD-10-CM

## 2024-01-23 DIAGNOSIS — R002 Palpitations: Secondary | ICD-10-CM

## 2024-01-23 DIAGNOSIS — I4430 Unspecified atrioventricular block: Secondary | ICD-10-CM

## 2024-01-23 LAB — CUP PACEART INCLINIC DEVICE CHECK
Battery Remaining Longevity: 110 mo
Battery Voltage: 2.99 V
Brady Statistic RA Percent Paced: 33 %
Brady Statistic RV Percent Paced: 61 %
Date Time Interrogation Session: 20250905120323
Implantable Lead Connection Status: 753985
Implantable Lead Connection Status: 753985
Implantable Lead Implant Date: 20240409
Implantable Lead Implant Date: 20240409
Implantable Lead Location: 753859
Implantable Lead Location: 753860
Implantable Pulse Generator Implant Date: 20240409
Lead Channel Impedance Value: 400 Ohm
Lead Channel Impedance Value: 462.5 Ohm
Lead Channel Pacing Threshold Amplitude: 0.625 V
Lead Channel Pacing Threshold Amplitude: 0.75 V
Lead Channel Pacing Threshold Amplitude: 0.75 V
Lead Channel Pacing Threshold Pulse Width: 0.5 ms
Lead Channel Pacing Threshold Pulse Width: 0.5 ms
Lead Channel Pacing Threshold Pulse Width: 0.5 ms
Lead Channel Sensing Intrinsic Amplitude: 12 mV
Lead Channel Sensing Intrinsic Amplitude: 4.3 mV
Lead Channel Setting Pacing Amplitude: 0.875
Lead Channel Setting Pacing Amplitude: 2 V
Lead Channel Setting Pacing Pulse Width: 0.5 ms
Lead Channel Setting Sensing Sensitivity: 2 mV
Pulse Gen Model: 2272
Pulse Gen Serial Number: 8169291

## 2024-01-23 NOTE — Patient Instructions (Signed)

## 2024-01-23 NOTE — Progress Notes (Signed)
  Electrophysiology Office Note:    Date:  01/23/2024   ID:  Traci Clark, DOB Mar 02, 1931, MRN [id number]  PCP:  Jolee Elsie RAMAN, PA   [facility] Providers Cardiologist:  Diannah SHAUNNA Maywood, MD Electrophysiologist:  Eulas FORBES Furbish, MD     Referring MD: Jolee Elsie RAMAN, PA   History of Present Illness:    Traci Clark is a 89 y.o. female with a hx listed below, significant for HTN, DM 2, stage III CKD, bilateral iliac artery aneurysm (vascular surgery recommended no further intervention), referred for arrhythmia management.    A monitor was placed in January of this year to evaluate palpitations.  It showed an episode of Mobitz 2 AV block at 8 AM.  The patient is typically asleep at that time.  She did indicate some symptom triggered episodes, but these were associated with sinus rhythm.  Prior to the pacemaker placement she had complained of multiple episodes of lightheadedness and passing out.  Monitor was placed that showed episodes of second-degree AV block during the daytime.  She underwent placement of a Saint Jude dual-chamber pacemaker in April 2024.  The procedure was uncomplicated and she has tolerated it well.     Today, she is doing very well.  She has no complaints. she has no device related complaints -- no new tenderness, drainage, redness.   EKGs/Labs/Other Studies Reviewed Today:       EKG:        Recent Labs: 10/03/2023: ALT 12; Hemoglobin 11.8; Platelets 161 10/04/2023: BUN 11; Creatinine, Ser 1.06; Magnesium 1.8; Potassium 3.8; Sodium 136     Physical Exam:    VS:  BP 130/70   Pulse 77   Ht 5' 6 (1.676 m)   Wt 156 lb (70.8 kg)   SpO2 99%   BMI 25.18 kg/m     Wt Readings from Last 3 Encounters:  01/23/24 156 lb (70.8 kg)  11/11/23 158 lb 12.8 oz (72 kg)  10/03/23 154 lb (69.9 kg)     GEN:  Well nourished, well developed in no acute distress CARDIAC: RRR, no murmurs, rubs, gallops RESPIRATORY:  Normal work of  breathing MUSCULOSKELETAL: no edema  Device interrogation today --I reviewed the device interrogation in detail.  See Paceart for report  ASSESSMENT & PLAN:    Recurrent syncope and pre-syncope:  Sinus bradycardia and second-degree AV block detected on monitor  Status post placement of a Saint Jude dual-chamber pacemaker  Saint Jude dual chamber pacemaker Device interrogated today Normal function See Paceart for report She is not dependent today        Medication Adjustments/Labs and Tests Ordered: Current medicines are reviewed at length with the patient today.  Concerns regarding medicines are outlined above.  No orders of the defined types were placed in this encounter.  No orders of the defined types were placed in this encounter.    Signed, Eulas FORBES Furbish, MD  01/23/2024 11:21 AM    [facility]

## 2024-02-25 ENCOUNTER — Ambulatory Visit: Payer: Self-pay

## 2024-02-25 DIAGNOSIS — R002 Palpitations: Secondary | ICD-10-CM

## 2024-02-26 LAB — CUP PACEART REMOTE DEVICE CHECK
Battery Remaining Longevity: 98 mo
Battery Remaining Percentage: 86 %
Battery Voltage: 2.99 V
Brady Statistic AP VP Percent: 52 %
Brady Statistic AP VS Percent: 1 %
Brady Statistic AS VP Percent: 48 %
Brady Statistic AS VS Percent: 1 %
Brady Statistic RA Percent Paced: 52 %
Brady Statistic RV Percent Paced: 99 %
Date Time Interrogation Session: 20251008040221
Implantable Lead Connection Status: 753985
Implantable Lead Connection Status: 753985
Implantable Lead Implant Date: 20240409
Implantable Lead Implant Date: 20240409
Implantable Lead Location: 753859
Implantable Lead Location: 753860
Implantable Pulse Generator Implant Date: 20240409
Lead Channel Impedance Value: 390 Ohm
Lead Channel Impedance Value: 460 Ohm
Lead Channel Pacing Threshold Amplitude: 0.75 V
Lead Channel Pacing Threshold Amplitude: 0.75 V
Lead Channel Pacing Threshold Pulse Width: 0.5 ms
Lead Channel Pacing Threshold Pulse Width: 0.5 ms
Lead Channel Sensing Intrinsic Amplitude: 12 mV
Lead Channel Sensing Intrinsic Amplitude: 3.5 mV
Lead Channel Setting Pacing Amplitude: 1 V
Lead Channel Setting Pacing Amplitude: 2 V
Lead Channel Setting Pacing Pulse Width: 0.5 ms
Lead Channel Setting Sensing Sensitivity: 2 mV
Pulse Gen Model: 2272
Pulse Gen Serial Number: 8169291

## 2024-02-27 NOTE — Progress Notes (Signed)
 Remote PPM Transmission

## 2024-03-01 ENCOUNTER — Ambulatory Visit: Payer: Self-pay | Admitting: Cardiovascular Disease

## 2024-03-03 ENCOUNTER — Encounter (INDEPENDENT_AMBULATORY_CARE_PROVIDER_SITE_OTHER): Payer: Self-pay | Admitting: Gastroenterology

## 2024-03-05 ENCOUNTER — Emergency Department (HOSPITAL_COMMUNITY)

## 2024-03-05 ENCOUNTER — Encounter (HOSPITAL_COMMUNITY): Payer: Self-pay | Admitting: Emergency Medicine

## 2024-03-05 ENCOUNTER — Telehealth: Payer: Self-pay | Admitting: Internal Medicine

## 2024-03-05 ENCOUNTER — Emergency Department (HOSPITAL_COMMUNITY)
Admission: EM | Admit: 2024-03-05 | Discharge: 2024-03-05 | Disposition: A | Discharge status: Home / Self Care | Attending: Emergency Medicine | Admitting: Emergency Medicine

## 2024-03-05 ENCOUNTER — Other Ambulatory Visit: Payer: Self-pay

## 2024-03-05 DIAGNOSIS — R0789 Other chest pain: Secondary | ICD-10-CM | POA: Diagnosis not present

## 2024-03-05 DIAGNOSIS — R0602 Shortness of breath: Secondary | ICD-10-CM | POA: Diagnosis present

## 2024-03-05 DIAGNOSIS — Z794 Long term (current) use of insulin: Secondary | ICD-10-CM | POA: Insufficient documentation

## 2024-03-05 DIAGNOSIS — Z950 Presence of cardiac pacemaker: Secondary | ICD-10-CM | POA: Insufficient documentation

## 2024-03-05 DIAGNOSIS — E1122 Type 2 diabetes mellitus with diabetic chronic kidney disease: Secondary | ICD-10-CM | POA: Diagnosis not present

## 2024-03-05 DIAGNOSIS — R079 Chest pain, unspecified: Secondary | ICD-10-CM

## 2024-03-05 DIAGNOSIS — N189 Chronic kidney disease, unspecified: Secondary | ICD-10-CM | POA: Diagnosis not present

## 2024-03-05 DIAGNOSIS — Z79899 Other long term (current) drug therapy: Secondary | ICD-10-CM | POA: Insufficient documentation

## 2024-03-05 DIAGNOSIS — I129 Hypertensive chronic kidney disease with stage 1 through stage 4 chronic kidney disease, or unspecified chronic kidney disease: Secondary | ICD-10-CM | POA: Diagnosis not present

## 2024-03-05 LAB — BASIC METABOLIC PANEL WITH GFR
Anion gap: 11 (ref 5–15)
BUN: 11 mg/dL (ref 8–23)
CO2: 26 mmol/L (ref 22–32)
Calcium: 9.6 mg/dL (ref 8.9–10.3)
Chloride: 101 mmol/L (ref 98–111)
Creatinine, Ser: 1.29 mg/dL — ABNORMAL HIGH (ref 0.44–1.00)
GFR, Estimated: 39 mL/min — ABNORMAL LOW (ref >60)
Glucose, Bld: 183 mg/dL — ABNORMAL HIGH (ref 70–99)
Potassium: 4 mmol/L (ref 3.5–5.1)
Sodium: 138 mmol/L (ref 135–145)

## 2024-03-05 LAB — CBC
HCT: 37.3 % (ref 36.0–46.0)
Hemoglobin: 12.7 g/dL (ref 12.0–15.0)
MCH: 30.2 pg (ref 26.0–34.0)
MCHC: 34 g/dL (ref 30.0–36.0)
MCV: 88.6 fL (ref 80.0–100.0)
Platelets: 192 K/uL (ref 150–400)
RBC: 4.21 MIL/uL (ref 3.87–5.11)
RDW: 13.2 % (ref 11.5–15.5)
WBC: 7.3 K/uL (ref 4.0–10.5)
nRBC: 0 % (ref 0.0–0.2)

## 2024-03-05 LAB — PRO BRAIN NATRIURETIC PEPTIDE: Pro Brain Natriuretic Peptide: 127 pg/mL (ref <300.0)

## 2024-03-05 LAB — TROPONIN T, HIGH SENSITIVITY
Troponin T High Sensitivity: <15 ng/L (ref 0–19)
Troponin T High Sensitivity: <15 ng/L (ref 0–19)

## 2024-03-05 LAB — D-DIMER, QUANTITATIVE: D-Dimer, Quant: 3.04 ug{FEU}/mL — ABNORMAL HIGH (ref 0.00–0.50)

## 2024-03-05 MED ADMIN — Tramadol HCl Tab 50 MG: 50 mg | ORAL | NDC 60687079511

## 2024-03-05 MED ADMIN — Iohexol IV Soln 350 MG/ML: 60 mL | INTRAVENOUS | NDC 00407141490

## 2024-03-05 MED FILL — Tramadol HCl Tab 50 MG: 50.0000 mg | ORAL | Qty: 1 | Status: AC

## 2024-03-05 NOTE — ED Notes (Signed)
 Received report from Lauren.pt resting.ambulated to bathroom with help, uses walker at home. A/o. C/o shob that is worse with activity and chest heaviness rating 6 at this time. Color wnl. Non diaphoretic. Nad at this time. Pt updated by EDP

## 2024-03-05 NOTE — Telephone Encounter (Signed)
 Spoke to patient regarding these symptoms  Patient is SOB on the phone, Heart is fluttering and has chest heaviness. She states when walking she cannot make it very far without having to sit to rest and catch her breath this has been going on for 3 days. Chest Heaviness: 0-10 is at a 5 and it started last night  Heart fluttering: patient states this is really bad and started yesterday during the day and patient has never been dx with afib or any arrhythmias Patient does not have NTG  Patient has not changed any medications recently. Medication list updated. No other symptoms. Patient lives [place] little over [place] minutes [place] in [place] and patient daughter is with her.  Advised patient if pain worsened before hearing back to please proceed to the ED.  Routed to DOD and NP for review.

## 2024-03-05 NOTE — ED Provider Notes (Signed)
 [place] EMERGENCY DEPARTMENT AT [facility] Provider Note   CSN: [id number] Arrival date & time: 03/05/24  1301     Patient presents with: Shortness of Breath   Traci Clark is a 89 y.o. female.   89 year old female with history of heart block status post Abbott pacemaker, hypertension, diabetes, esophagitis, CKD, PE (not currently on [facility]), who presents emergency department with chest tightness and shortness of breath for 3 days.  Worsened with exertion.  Also get sweaty and weak with this.  Says that she has been having palpitations as well.  No vomiting.  Chest pressure is 6/10 and substernal.  No radiation.  Dr. Cardiology team today who referred her into the emergency department       Prior to Admission medications   Medication Sig Start Date End Date Taking? Authorizing Provider  acetaminophen  (TYLENOL ) 500 MG tablet Take 1,000 mg by mouth every 6 (six) hours as needed (pain.). Gelcaps    [provider]  albuterol  (VENTOLIN  HFA) 108 (90 Base) MCG/ACT inhaler Inhale 1-2 puffs into the lungs every 6 (six) hours as needed for shortness of breath or wheezing.    [provider]  allopurinol  (ZYLOPRIM ) 100 MG tablet Take 100 mg by mouth in the morning.    [provider]  ALPRAZolam  (XANAX ) 0.5 MG tablet Take 0.5 mg by mouth 3 (three) times daily as needed for anxiety.    [provider]  clobetasol ointment (TEMOVATE) 0.05 % Apply 1 Application topically 2 (two) times daily as needed (irritation.). 07/22/22   [provider]  dexlansoprazole (DEXILANT) 60 MG capsule Take 60 mg by mouth daily before breakfast.    [provider]  fluticasone (FLONASE) 50 MCG/ACT nasal spray Place 2 sprays into both nostrils daily as needed (allergies (cough/sneezing)). 09/24/20   [provider]  furosemide  (LASIX ) 20 MG tablet Take 20 mg by mouth in the morning.    [provider]  insulin  aspart (NOVOLOG  FLEXPEN  RELION) 100 UNIT/ML FlexPen Inject 4-10 Units into the skin 3 (three) times daily with meals. Per sliding scale >150 = 4 units 200-250 = 6 units 251-350 = 8 units >351 = 10 units    [provider]  Insulin  Glargine (BASAGLAR  KWIKPEN) 100 UNIT/ML Inject 20 Units into the skin at bedtime. 06/15/23   [provider]  latanoprost  (XALATAN ) 0.005 % ophthalmic solution Place 1 drop into both eyes at bedtime. 09/21/20   [provider]  LUTEIN PO Take 40 mg by mouth daily.    [provider]  meclizine  (ANTIVERT ) 25 MG tablet Take 1 tablet (25 mg total) by mouth 3 (three) times daily as needed for dizziness. 07/30/21   Carlan, Chelsea L, NP  Menthol , Topical Analgesic, (ICY HOT BACK EXTRA STRENGTH) 5 % PTCH Place 1 patch onto the skin daily as needed (pain.).    [provider]  nystatin  cream (MYCOSTATIN ) Apply 1 Application topically 2 (two) times daily. 09/28/23   [provider]  ondansetron  (ZOFRAN ) 4 MG tablet Take 4 mg by mouth every 8 (eight) hours as needed for nausea or vomiting. Dissolving tablet 08/21/23   [provider]  phenazopyridine (PYRIDIUM) 200 MG tablet Take 200 mg by mouth 3 (three) times daily as needed for pain. 09/23/23   [provider]  polyethylene glycol (MIRALAX  / GLYCOLAX ) packet Take 34 g by mouth in the morning.    [provider]  potassium chloride (MICRO-K) 10 MEQ CR capsule Take 10 mEq  by mouth in the morning.    [provider]  tacrolimus (PROTOPIC) 0.1 % ointment Apply 1 Application topically 2 (two) times daily. 06/23/23   [provider]  traMADol  (ULTRAM ) 50 MG tablet Take 1 tablet (50 mg total) by mouth every 6 (six) hours as needed for severe pain. 04/17/22   Maree, Pratik D, DO  triamcinolone ointment (KENALOG) 0.1 % Apply 1 Application topically daily. 08/19/22   [provider]    Allergies: Amoxil [amoxicillin], Macrobid [nitrofurantoin monohyd macro], Codeine,  Darvon [propoxyphene], Phenergan  [promethazine ], Pravachol [pravastatin], Rocephin  [ceftriaxone ], Sulfa antibiotics, Hydrocodone-acetaminophen , Mirtazapine, and Neurontin  [gabapentin ]    Review of Systems  Updated Vital Signs BP (!) 154/82   Pulse 60   Temp 97.7 F (36.5 C) (Oral)   Resp 15   Ht 5' 6 (1.676 m)   Wt 71.2 kg   SpO2 96%   BMI 25.34 kg/m   Physical Exam Vitals and nursing note reviewed.  Constitutional:      General: She is not in acute distress.    Appearance: She is well-developed.  HENT:     Head: Normocephalic and atraumatic.     Right Ear: External ear normal.     Left Ear: External ear normal.     Nose: Nose normal.  Eyes:     Extraocular Movements: Extraocular movements intact.     Conjunctiva/sclera: Conjunctivae normal.     Pupils: Pupils are equal, round, and reactive to light.  Cardiovascular:     Rate and Rhythm: Normal rate and regular rhythm.     Heart sounds: No murmur heard.    Comments: Chest pain not reproducible.  No overlying rashes on chest wall Pulmonary:     Effort: Pulmonary effort is normal. No respiratory distress.     Breath sounds: Normal breath sounds.  Musculoskeletal:     Cervical back: Normal range of motion and neck supple.     Right lower leg: No edema.     Left lower leg: No edema.  Skin:    General: Skin is warm and dry.  Neurological:     Mental Status: She is alert and oriented to person, place, and time. Mental status is at baseline.  Psychiatric:        Mood and Affect: Mood normal.     (all labs ordered are listed, but only abnormal results are displayed) Labs Reviewed  BASIC METABOLIC PANEL WITH GFR - Abnormal; Notable for the following components:      Result Value   Glucose, Bld 183 (*)    Creatinine, Ser 1.29 (*)    GFR, Estimated 39 (*)    All other components within normal limits  D-DIMER, QUANTITATIVE - Abnormal; Notable for the following components:   D-Dimer, Quant 3.04 (*)    All other  components within normal limits  CBC  PRO BRAIN NATRIURETIC PEPTIDE  TROPONIN T, HIGH SENSITIVITY  TROPONIN T, HIGH SENSITIVITY    EKG: EKG Interpretation Date/Time:  Friday March 05 2024 13:40:20 EDT Ventricular Rate:  67 PR Interval:  280 QRS Duration:  146 QT Interval:  448 QTC Calculation: 473 R Axis:   -39  Text Interpretation: VENTRICULAR PACED RHYTHM Prolonged PR interval Ventricular preexcitation Confirmed by Yolande Charleston [phone]) on 03/05/2024 2:17:44 PM  Radiology: CT Angio Chest PE W and/or Wo Contrast Result Date: 03/05/2024 CLINICAL DATA:  Short of breath, palpitations EXAM: CT ANGIOGRAPHY CHEST WITH CONTRAST TECHNIQUE: Multidetector CT imaging of the chest was performed using the standard protocol during bolus  administration of intravenous contrast. Multiplanar CT image reconstructions and MIPs were obtained to evaluate the vascular anatomy. RADIATION DOSE REDUCTION: This exam was performed according to the departmental dose-optimization program which includes automated exposure control, adjustment of the mA and/or kV according to patient size and/or use of iterative reconstruction technique. CONTRAST:  60mL OMNIPAQUE  IOHEXOL  350 MG/ML SOLN COMPARISON:  03/05/2024, 05/29/2022 FINDINGS: Cardiovascular: This is a technically adequate evaluation of the pulmonary vasculature. There are no acute pulmonary emboli. A single thin synechia in the right upper lobe consistent with previous history of right upper lobe pulmonary embolus. The heart is unremarkable without pericardial effusion. Dual lead pacer within the left chest, proximal lead in the right atrium and distal lead in the right ventricle. Normal caliber of the thoracic aorta. Assessment of the aortic lumen is limited by timing of contrast bolus. Atherosclerosis of the aorta and coronary vasculature. Mediastinum/Nodes: No enlarged mediastinal, hilar, or axillary lymph nodes. Thyroid  gland, trachea, and esophagus demonstrate  no significant findings. Lungs/Pleura: No acute airspace disease, effusion, or pneumothorax. The central airways are patent. Upper Abdomen: No acute abnormality. Musculoskeletal: No acute or destructive bony abnormalities. Bilateral shoulder osteoarthritis, left greater than right with severe left glenohumeral joint space narrowing and marked left shoulder synovial osteochondromatosis. Reconstructed images demonstrate no additional findings. Review of the MIP images confirms the above findings. IMPRESSION: 1. No acute pulmonary embolus. 2. Single thin synechia remaining within the right upper lobe pulmonary artery, consistent with sequela of previous pulmonary embolus. 3. Aortic Atherosclerosis (ICD10-I70.0). Coronary artery atherosclerosis. Electronically Signed   By: Ozell Daring M.D.   On: 03/05/2024 18:06   DG Chest 2 View Result Date: 03/05/2024 CLINICAL DATA:  Shortness of breath and chest pain for 3 days. EXAM: CHEST - 2 VIEW COMPARISON:  08/27/2022 FINDINGS: The cardiac silhouette, mediastinal and hilar contours are within normal limits and stable. Stable tortuosity and calcification of the thoracic aorta. The pacer wires are stable. No acute pulmonary findings. No infiltrates, edema or effusions. No pulmonary lesions or pneumothorax. The bony thorax is intact. IMPRESSION: No acute cardiopulmonary findings. Electronically Signed   By: MYRTIS Stammer M.D.   On: 03/05/2024 14:31     Procedures   Medications Ordered in the ED  traMADol  (ULTRAM ) tablet 50 mg (has no administration in time range)  iohexol  (OMNIPAQUE ) 350 MG/ML injection 60 mL (60 mLs Intravenous Contrast Given 03/05/24 1703)    Clinical Course as of 03/05/24 1815  Fri Mar 05, 2024  1638 Signed out to Dr Towana [RP]    Clinical Course User Index [RP] Yolande Lamar BROCKS, MD                                 Medical Decision Making Amount and/or Complexity of Data Reviewed Labs: ordered. Radiology:  ordered.  Risk Prescription drug management.   89 year old female with history of heart block status post Abbott pacemaker, hypertension, diabetes, esophagitis, CKD, PE (not currently on [facility]), who presents emergency department with chest tightness and shortness of breath for 3 days.   Initial Ddx:  MI, PE, pneumonia, URI, cardiac tamponade, arrhythmia  MDM/Course:  Patient presents to the emergency department with chest discomfort.  Also reports some shortness of breath and weakness especially with exertion.  On exam is not in acute distress.  She satting well on room air.  No abnormal lung sounds.  No obvious signs of DVT on exam.  EKG was obtained which  shows a ventricular paced rhythm.  High-sensitivity troponins undetectably low x 2.  Has a Geneva score of 4 making her moderate risk for PE so D-dimer was sent which was elevated.  CTA of the chest was obtained and is pending at this point in time.  Upon re-evaluation patient was stable without any new symptoms.  Have ordered an interrogation of her pacemaker as well to further evaluate what could be going on.  Signed out to the oncoming physician awaiting pacemaker interrogation and CTA.  This patient presents to the ED for concern of complaints listed in HPI, this involves an extensive number of treatment options, and is a complaint that carries with it a high risk of complications and morbidity. Disposition including potential need for admission considered.   Dispo: Pending remainder of workup  Additional history obtained from daughter Records reviewed Outpatient Clinic Notes The following labs were independently interpreted: Chemistry and show CKD I independently reviewed the following imaging with scope of interpretation limited to determining acute life threatening conditions related to emergency care: Chest x-ray and agree with the radiologist interpretation with the following exceptions: none I personally reviewed and interpreted  cardiac monitoring: Paced rhythm I personally reviewed and interpreted the pt's EKG: see above for interpretation  I have reviewed the patients home medications and made adjustments as needed Social Determinants of health:  Geriatric  Portions of this note were generated with Scientist, clinical (histocompatibility and immunogenetics). Dictation errors may occur despite best attempts at proofreading.     Final diagnoses:  SOB (shortness of breath)  Nonspecific chest pain    ED Discharge Orders     None          Yolande Lamar BROCKS, MD 03/05/24 1815

## 2024-03-05 NOTE — ED Triage Notes (Signed)
 Pt c/o of sob and chest feeling heavy x3 days.

## 2024-03-05 NOTE — Discharge Instructions (Signed)
 You were seen in the emergency department for chest pain and shortness of breath.  You had blood work EKG chest x-ray and a CAT scan of your chest along with interrogation of your pacemaker that did not show any significant abnormalities.  Please continue your regular medications and follow-up with your primary care doctor and cardiology team.  Return if any worsening or concerning symptoms

## 2024-03-05 NOTE — Telephone Encounter (Signed)
 Patient informed and verbalized understanding of plan. Patient's daughter is going to take her to [facility] ED to be seen.

## 2024-03-05 NOTE — Telephone Encounter (Signed)
 Patient c/o Palpitations: STAT if patient c/o lightheadedness, shortness of breath, or chest pain  How long have you had palpitations/irregular HR/ Afib? Are you having the symptoms now? Yesterday   Are you currently experiencing lightheadedness, SOB or CP? No  Do you have a history of afib (atrial fibrillation) or irregular heart rhythm? N/A  Have you checked your BP or HR? (document readings if available): N/A  Are you experiencing any other symptoms? No

## 2024-03-05 NOTE — ED Provider Notes (Signed)
 Plan of from Dr. Jakie.  89 year old female here with chest tightness shortness of breath for 3 days.  She is pending interrogation of her pacemaker and second troponin, CT angio chest.  She does have a history of PE but is not on anticoagulation at this time.  If no acute findings identified she can be discharged to follow-up with her outpatient team Physical Exam  BP 126/70 (BP Location: Right Arm)   Pulse 72   Temp 97.7 F (36.5 C) (Oral)   Resp 16   Ht 5' 6 (1.676 m)   Wt 71.2 kg   SpO2 98%   BMI 25.34 kg/m   Physical Exam  Procedures  Procedures  ED Course / MDM   Clinical Course as of 03/05/24 1810  Fri Mar 05, 2024  1638 Signed out to Dr Towana [RP]    Clinical Course User Index [RP] Yolande Lamar BROCKS, MD   Medical Decision Making Amount and/or Complexity of Data Reviewed Labs: ordered. Radiology: ordered.  Risk Prescription drug management.   Troponin flat.  Pacemaker interrogated and had no significant events.  CT does not show any new acute PE or other concerning findings.  6 PM.  Reviewed results of workup with patient and family member.  She said she is feeling better and is comfortable plan for discharge.  Recommended close follow-up with her PCP and cardiologist.  Return instructions discussed     Towana Ozell BROCKS, MD 03/05/24 1814

## 2024-03-25 ENCOUNTER — Ambulatory Visit: Attending: Internal Medicine | Admitting: Internal Medicine

## 2024-03-25 ENCOUNTER — Encounter: Payer: Self-pay | Admitting: Internal Medicine

## 2024-03-25 VITALS — BP 124/74 | HR 74 | Ht 66.0 in | Wt 161.0 lb

## 2024-03-25 DIAGNOSIS — R002 Palpitations: Secondary | ICD-10-CM | POA: Diagnosis not present

## 2024-03-25 DIAGNOSIS — I441 Atrioventricular block, second degree: Secondary | ICD-10-CM | POA: Diagnosis not present

## 2024-03-25 DIAGNOSIS — I951 Orthostatic hypotension: Secondary | ICD-10-CM | POA: Diagnosis not present

## 2024-03-25 DIAGNOSIS — I723 Aneurysm of iliac artery: Secondary | ICD-10-CM | POA: Diagnosis not present

## 2024-03-25 MED ORDER — DILTIAZEM HCL 30 MG PO TABS: 30.0000 mg | ORAL_TABLET | Freq: Every day | ORAL | 5 refills | Status: AC | PRN

## 2024-03-25 NOTE — Patient Instructions (Signed)
 Medication Instructions:  Your physician has recommended you make the following change in your medication:  Start taking Diltiazem 30 mg daily as needed for palpitations at bedtime Continue taking all other medications as prescribed   Labwork: None  Testing/Procedures: None  Follow-Up: Your physician recommends that you schedule a follow-up appointment in: 1  year. You will receive a reminder call in about 8 months reminding you to schedule your appointment. If you don't receive this call, please contact our office.   Any Other Special Instructions Will Be Listed Below (If Applicable). Thank you for choosing [facility]!     If you need a refill on your cardiac medications before your next appointment, please call your pharmacy.

## 2024-03-25 NOTE — Progress Notes (Signed)
 Cardiology Office Note  Date: 03/25/2024   ID: Traci Clark, DOB 08/03/1930, MRN [id number]  PCP:  Jolee Elsie RAMAN, PA  Cardiologist:  Diannah SHAUNNA Maywood, MD Electrophysiologist:  Eulas FORBES Furbish, MD    History of Present Illness: Traci Clark is a 89 y.o. female known to have HTN, DM 2, stage III CKD, bilateral iliac artery aneurysm (vascular surgery recommended no further intervention) presented to cardiology clinic for follow-up.  Underwent PPM placement in 08/2022, follows with EP.  Doing great overall.  Continues to have dizziness that was previously deemed to be from orthostatic hypotension.  Strongly encouraged p.o. hydration.  Patient is drinking fluids but not water .  She fell 3 times since the last clinic visit.  Does not recall when she feels dizzy but mostly with positional changes.  No angina.  She has shortness of breath for which she had ER visit recently a few weeks ago in October 2025.  CT angio chest revealed no evidence of pulm embolism.  BNP within normal meds.  She stated that she took inhalers and her breathing improved significantly.  Past Medical History:  Diagnosis Date   Anxiety    Arthritis    Chronic constipation    CKD (chronic kidney disease), stage III (HCC)    Colon cancer (HCC)    colon   Complication of anesthesia    Diabetes mellitus without complication (HCC)    DVT (deep vein thrombosis) in pregnancy    Dysphagia    GERD (gastroesophageal reflux disease)    Glaucoma    Gout    H/O hiatal hernia    History of kidney stones    Iliac artery aneurysm, bilateral    MI (myocardial infarction) (HCC) 1993   PONV (postoperative nausea and vomiting)    Restless leg syndrome    Seizure (HCC)    withdrawal from xanax    Vertigo     Past Surgical History:  Procedure Laterality Date   BIOPSY  07/21/2020   Procedure: BIOPSY;  Surgeon: Eartha Angelia Sieving, MD;  [facility]: [facility];  Service: Gastroenterology;;   BIOPSY   03/26/2022   Procedure: BIOPSY;  Surgeon: Eartha Angelia Sieving, MD;  [facility]: [facility];  Service: Gastroenterology;;   CHOLECYSTECTOMY     COLECTOMY  2003   COLONOSCOPY N/A 07/29/2012   Procedure: COLONOSCOPY;  Surgeon: Claudis RAYMOND Rivet, MD;  [facility]: [facility];  Service: Endoscopy;  Laterality: N/A;  100   COLONOSCOPY WITH PROPOFOL  N/A 12/13/2016   Procedure: COLONOSCOPY WITH PROPOFOL ;  Surgeon: Rivet Claudis RAYMOND, MD;  Location: [facility];  Service: Endoscopy;  Laterality: N/A;  8:30   COLONOSCOPY WITH PROPOFOL  N/A 07/21/2020   Procedure: COLONOSCOPY WITH PROPOFOL ;  Surgeon: Eartha Angelia Sieving, MD;  [facility]: [facility];  Service: Gastroenterology;  Laterality: N/A;  AM   ESOPHAGEAL DILATION  11/03/2015   Procedure: ESOPHAGEAL DILATION;  Surgeon: Claudis RAYMOND Rivet, MD;  [facility]: [facility];  Service: Endoscopy;;   ESOPHAGEAL DILATION N/A 09/26/2017   Procedure: ESOPHAGEAL DILATION;  Surgeon: Rivet Claudis RAYMOND, MD;  [facility]: [facility];  Service: Endoscopy;  Laterality: N/A;   ESOPHAGEAL DILATION N/A 10/03/2023   Procedure: DILATION, ESOPHAGUS;  Surgeon: Shaaron Lamar HERO, MD;  [facility]: [facility];  Service: Endoscopy;  Laterality: N/A;   ESOPHAGOGASTRODUODENOSCOPY N/A 03/25/2014   Procedure: ESOPHAGOGASTRODUODENOSCOPY (EGD);  Surgeon: Claudis RAYMOND Rivet, MD;  [facility]: [facility];  Service: Endoscopy;  Laterality: N/A;  1055   ESOPHAGOGASTRODUODENOSCOPY N/A  11/03/2015   Procedure: ESOPHAGOGASTRODUODENOSCOPY (EGD);  Surgeon: Claudis RAYMOND Rivet, MD;  [facility]: [facility];  Service: Endoscopy;  Laterality: N/A;  1:55 - moved to 11:15 - Ann to notify - moved to 12:45 - pt knows to arrive at 11:45   ESOPHAGOGASTRODUODENOSCOPY N/A 09/26/2017   Procedure: ESOPHAGOGASTRODUODENOSCOPY (EGD);  Surgeon: Rivet Claudis RAYMOND, MD;  [facility];  Service: Endoscopy;  Laterality: N/A;   ESOPHAGOGASTRODUODENOSCOPY N/A 10/03/2023   Procedure: EGD (ESOPHAGOGASTRODUODENOSCOPY);   Surgeon: Shaaron Lamar HERO, MD;  [facility];  Service: Endoscopy;  Laterality: N/A;   ESOPHAGOGASTRODUODENOSCOPY (EGD) WITH PROPOFOL  N/A 07/21/2020   Procedure: ESOPHAGOGASTRODUODENOSCOPY (EGD) WITH PROPOFOL ;  Surgeon: Eartha Angelia Sieving, MD;  [facility]: [facility];  Service: Gastroenterology;  Laterality: N/A;   ESOPHAGOGASTRODUODENOSCOPY (EGD) WITH PROPOFOL  N/A 03/26/2022   Procedure: ESOPHAGOGASTRODUODENOSCOPY (EGD) WITH PROPOFOL ;  Surgeon: Eartha Angelia Sieving, MD;  [facility]: [facility];  Service: Gastroenterology;  Laterality: N/A;  1215 ASA 3   MALONEY DILATION N/A 03/25/2014   Procedure: MALONEY DILATION;  Surgeon: Claudis RAYMOND Rivet, MD;  [facility];  Service: Endoscopy;  Laterality: N/A;   PACEMAKER IMPLANT N/A 08/27/2022   Procedure: PACEMAKER IMPLANT;  Surgeon: Nancey Eulas BRAVO, MD;  Location: [facility];  Service: Cardiovascular;  Laterality: N/A;   POLYPECTOMY  12/13/2016   Procedure: POLYPECTOMY;  Surgeon: Rivet Claudis RAYMOND, MD;  [facility]: [facility];  Service: Endoscopy;;  polyp   POLYPECTOMY  07/21/2020   Procedure: POLYPECTOMY;  Surgeon: Eartha Angelia, Sieving, MD;  [facility]: [facility];  Service: Gastroenterology;;   TOTAL HIP ARTHROPLASTY Left    TUBAL LIGATION     VEIN SURGERY Bilateral    stripped vein due to DVT    Current Outpatient Medications  Medication Sig Dispense Refill   acetaminophen  (TYLENOL ) 500 MG tablet Take 1,000 mg by mouth every 6 (six) hours as needed (pain.). Gelcaps     albuterol  (VENTOLIN  HFA) 108 (90 Base) MCG/ACT inhaler Inhale 1-2 puffs into the lungs every 6 (six) hours as needed for shortness of breath or wheezing.     allopurinol  (ZYLOPRIM ) 100 MG tablet Take 100 mg by mouth in the morning.     ALPRAZolam  (XANAX ) 0.5 MG tablet Take 0.5 mg by mouth 3 (three) times daily as needed for anxiety.     clobetasol ointment (TEMOVATE) 0.05 % Apply 1 Application topically 2 (two) times daily as needed  (irritation.).     dexlansoprazole (DEXILANT) 60 MG capsule Take 60 mg by mouth daily before breakfast.     fluticasone (FLONASE) 50 MCG/ACT nasal spray Place 2 sprays into both nostrils daily as needed (allergies (cough/sneezing)).     furosemide  (LASIX ) 20 MG tablet Take 20 mg by mouth in the morning.     insulin  aspart (NOVOLOG  FLEXPEN RELION) 100 UNIT/ML FlexPen Inject 4-10 Units into the skin 3 (three) times daily with meals. Per sliding scale >150 = 4 units 200-250 = 6 units 251-350 = 8 units >351 = 10 units     Insulin  Glargine (BASAGLAR  KWIKPEN) 100 UNIT/ML Inject 20 Units into the skin at bedtime.     latanoprost  (XALATAN ) 0.005 % ophthalmic solution Place 1 drop into both eyes at bedtime.     LUTEIN PO Take 40 mg by mouth daily.     meclizine  (ANTIVERT ) 25 MG tablet Take 1 tablet (25 mg total) by mouth 3 (three) times daily as needed for dizziness. 30 tablet 0   Menthol , Topical Analgesic, (ICY HOT  BACK EXTRA STRENGTH) 5 % PTCH Place 1 patch onto the skin daily as needed (pain.).     nystatin  cream (MYCOSTATIN ) Apply 1 Application topically 2 (two) times daily.     ondansetron  (ZOFRAN ) 4 MG tablet Take 4 mg by mouth every 8 (eight) hours as needed for nausea or vomiting. Dissolving tablet     phenazopyridine (PYRIDIUM) 200 MG tablet Take 200 mg by mouth 3 (three) times daily as needed for pain.     polyethylene glycol (MIRALAX  / GLYCOLAX ) packet Take 34 g by mouth in the morning.     potassium chloride (MICRO-K) 10 MEQ CR capsule Take 10 mEq by mouth in the morning.     tacrolimus (PROTOPIC) 0.1 % ointment Apply 1 Application topically 2 (two) times daily.     traMADol  (ULTRAM ) 50 MG tablet Take 1 tablet (50 mg total) by mouth every 6 (six) hours as needed for severe pain. 10 tablet 0   triamcinolone ointment (KENALOG) 0.1 % Apply 1 Application topically daily.     No current facility-administered medications for this visit.   Allergies:  Amoxil [amoxicillin], Macrobid  [nitrofurantoin monohyd macro], Codeine, Darvon [propoxyphene], Phenergan  [promethazine ], Pravachol [pravastatin], Rocephin  [ceftriaxone ], Sulfa antibiotics, Hydrocodone-acetaminophen , Mirtazapine, and Neurontin  [gabapentin ]   Social History: The patient  reports that she has never smoked. She has never been exposed to tobacco smoke. She has never used smokeless tobacco. She reports that she does not drink alcohol and does not use drugs.   Family History: The patient's family history includes Heart disease in her father.   ROS:  Please see the history of present illness. Otherwise, complete review of systems is positive for none.  All other systems are reviewed and negative.   Physical Exam: VS:  BP 124/74 (BP Location: Left Arm, Cuff Size: Normal)   Pulse 74   Ht 5' 6 (1.676 m)   Wt 161 lb (73 kg)   SpO2 96%   BMI 25.99 kg/m , BMI Body mass index is 25.99 kg/m.  Wt Readings from Last 3 Encounters:  03/25/24 161 lb (73 kg)  03/05/24 157 lb (71.2 kg)  01/23/24 156 lb (70.8 kg)    General: Patient appears comfortable at rest. HEENT: Conjunctiva and lids normal, oropharynx clear with moist mucosa. Neck: Supple, no elevated JVP or carotid bruits, no thyromegaly. Lungs: Clear to auscultation, nonlabored breathing at rest. Cardiac: Regular rate and rhythm, no S3 or significant systolic murmur, no pericardial rub. Abdomen: Soft, nontender, no hepatomegaly, bowel sounds present, no guarding or rebound. Extremities: No pitting edema, distal pulses 2+. Skin: Warm and dry. Musculoskeletal: No kyphosis. Neuropsychiatric: Alert and oriented x3, affect grossly appropriate.  ECG: Normal sinus rhythm, first degree AV block with PR interval 322 ms  Recent Labwork: 10/03/2023: ALT 12; AST 21 10/04/2023: Magnesium 1.8 03/05/2024: BUN 11; Creatinine, Ser 1.29; Hemoglobin 12.7; Platelets 192; Potassium 4.0; Pro Brain Natriuretic Peptide 127.0; Sodium 138     Component Value Date/Time   CHOL 186  09/14/2020 0455   TRIG 82 09/14/2020 0455   HDL 53 09/14/2020 0455   CHOLHDL 3.5 09/14/2020 0455   VLDL 16 09/14/2020 0455   LDLCALC 117 (H) 09/14/2020 0455      Assessment and Plan:  # Orthostatic hypotension - Orthostatics  positive for orthostatic hypotension previously.  On p.o. Lasix  20 mg once daily, started by nephrology.  She is not drinking water .  Strongly encouraged patient to drink 2L water  and not just fluids.  She verbalized understanding.  Continues to feel  dizzy with positional changes and she fell 3 times in the last 1 year.  No LOC.  # Palpitations - Palpitations occur at night and last for about 1 hour.  PPM data reviewed.  No arrhythmias.  Diltiazem 30 mg as needed at night for palpitations.  # High-grade AV block s/p PPM in May 2024 - Follows with electrophysiology.  # Acute PE in 05/2022 - On Eliquis  in the past.  # Bilateral iliac artery aneurysm - Not a candidate for surgical intervention, per surgery.  No indication to repeat any imaging.  # HTN, controlled - Continue current antihypertensives.   30 minutes spent in reviewing prior medical records, specialist notes, more than 3 labs, discussion and documentation.  Disposition:  Follow up 1 year  Signed Deshon Koslowski Arleta Maywood, MD, 03/25/2024 11:14 AM    [facility] [address] [place], [place], [place] [address]

## 2024-04-20 ENCOUNTER — Ambulatory Visit (INDEPENDENT_AMBULATORY_CARE_PROVIDER_SITE_OTHER): Admitting: Gastroenterology

## 2024-04-26 ENCOUNTER — Ambulatory Visit (INDEPENDENT_AMBULATORY_CARE_PROVIDER_SITE_OTHER): Admitting: Gastroenterology

## 2024-05-10 ENCOUNTER — Encounter (INDEPENDENT_AMBULATORY_CARE_PROVIDER_SITE_OTHER): Payer: Self-pay | Admitting: Gastroenterology

## 2024-05-10 ENCOUNTER — Ambulatory Visit (INDEPENDENT_AMBULATORY_CARE_PROVIDER_SITE_OTHER): Admitting: Gastroenterology

## 2024-05-10 VITALS — BP 131/79 | HR 69 | Temp 99.0°F | Ht 66.0 in | Wt 162.0 lb

## 2024-05-10 DIAGNOSIS — K589 Irritable bowel syndrome without diarrhea: Secondary | ICD-10-CM | POA: Diagnosis not present

## 2024-05-10 DIAGNOSIS — R143 Flatulence: Secondary | ICD-10-CM | POA: Diagnosis not present

## 2024-05-10 DIAGNOSIS — Z8719 Personal history of other diseases of the digestive system: Secondary | ICD-10-CM

## 2024-05-10 DIAGNOSIS — R1013 Epigastric pain: Secondary | ICD-10-CM

## 2024-05-10 DIAGNOSIS — K219 Gastro-esophageal reflux disease without esophagitis: Secondary | ICD-10-CM

## 2024-05-10 DIAGNOSIS — K581 Irritable bowel syndrome with constipation: Secondary | ICD-10-CM

## 2024-05-10 NOTE — Progress Notes (Signed)
 Carolann Brazell Faizan Acadia Thammavong , M.D. Gastroenterology & Hepatology [facility] Gastroenterology [address] [place], [place] [address] Primary Care Physician: Jolee Elsie RAMAN, [place] [address] [place] [address]  Chief Complaint:   abdominal discomfort/IBS/GERD  History of Present Illness: Traci Clark is a 89 y.o. female history of CKD, history of colon cancer status post resection, diabetes, IBS, GERD, seizures, glaucoma, anxiety, DVT, gout  Candida esophagitis who presents for evaluation of abdominal discomfort/IBS/GERD  Patient was last seen by [facility] 10/2023 for dysphagia which resolved after dilation and was being managed for GERD and IBS like symptoms.  Patient reports that she does not have any dysphagia but would have some back of her throat.  She takes PPI after breakfast daily.  Patient would have a bowel movement every 2 to 4 days and takes MiraLAX  every other day.  She will have generalized abdominal discomfort relieved with defecation.  Her main issue remains gas in the stomach which is difficult to relief.  Denies any blood in stool or weight changes  CT A/P with contrast: 08/2023 No acute abnormality seen in the abdomen or pelvis.  Last Colonoscopy:07/21/20 - presence of a patent anastomosis in the ascending colon with normal mucosa, -3 polyps between 2 to 5 mm were removed from the ascending colon and descending colon. Last Endoscopy:-09/2023 Tandem cervical esophageal web and Schatzki's                            ring dilated as described above                           - Small hiatal hernia. Gastric polyp removed as                            described above (fundic gland polyp, biopsies negative otherwise) Last labs from 02/2024 hemoglobin 12.7 platelet 192 Creatinine 1.29  Past Medical History: Past Medical History:  Diagnosis Date   Anxiety    Arthritis    Chronic constipation    CKD (chronic kidney disease), stage III (HCC)     Colon cancer (HCC)    colon   Complication of anesthesia    Diabetes mellitus without complication (HCC)    DVT (deep vein thrombosis) in pregnancy    Dysphagia    GERD (gastroesophageal reflux disease)    Glaucoma    Gout    H/O hiatal hernia    History of kidney stones    Iliac artery aneurysm, bilateral    MI (myocardial infarction) (HCC) 1993   PONV (postoperative nausea and vomiting)    Restless leg syndrome    Seizure (HCC)    withdrawal from xanax    Vertigo     Past Surgical History: Past Surgical History:  Procedure Laterality Date   BIOPSY  07/21/2020   Procedure: BIOPSY;  Surgeon: Eartha Angelia Sieving, MD;  [facility];  Service: Gastroenterology;;   BIOPSY  03/26/2022   Procedure: BIOPSY;  Surgeon: Eartha Angelia Sieving, MD;  [facility];  Service: Gastroenterology;;   CHOLECYSTECTOMY     COLECTOMY  2003   COLONOSCOPY N/A 07/29/2012   Procedure: COLONOSCOPY;  Surgeon: Claudis RAYMOND Rivet, MD;  [facility];  Service: Endoscopy;  Laterality: N/A;  100   COLONOSCOPY WITH PROPOFOL  N/A 12/13/2016   Procedure: COLONOSCOPY WITH PROPOFOL ;  Surgeon: Golda Claudis PENNER, MD;  [facility];  Service: Endoscopy;  Laterality: N/A;  8:30   COLONOSCOPY WITH PROPOFOL  N/A 07/21/2020   Procedure: COLONOSCOPY WITH PROPOFOL ;  Surgeon: Eartha Angelia Sieving, MD;  [facility];  Service: Gastroenterology;  Laterality: N/A;  AM   ESOPHAGEAL DILATION  11/03/2015   Procedure: ESOPHAGEAL DILATION;  Surgeon: Claudis PENNER Golda, MD;  [facility];  Service: Endoscopy;;   ESOPHAGEAL DILATION N/A 09/26/2017   Procedure: ESOPHAGEAL DILATION;  Surgeon: Golda Claudis PENNER, MD;  [facility];  Service: Endoscopy;  Laterality: N/A;   ESOPHAGEAL DILATION N/A 10/03/2023   Procedure: DILATION, ESOPHAGUS;  Surgeon: Shaaron Lamar HERO, MD;  [facility];  Service: Endoscopy;  Laterality: N/A;   ESOPHAGOGASTRODUODENOSCOPY N/A  03/25/2014   Procedure: ESOPHAGOGASTRODUODENOSCOPY (EGD);  Surgeon: Claudis PENNER Golda, MD;  [facility];  Service: Endoscopy;  Laterality: N/A;  1055   ESOPHAGOGASTRODUODENOSCOPY N/A 11/03/2015   Procedure: ESOPHAGOGASTRODUODENOSCOPY (EGD);  Surgeon: Claudis PENNER Golda, MD;  [facility];  Service: Endoscopy;  Laterality: N/A;  1:55 - moved to 11:15 - Ann to notify - moved to 12:45 - pt knows to arrive at 11:45   ESOPHAGOGASTRODUODENOSCOPY N/A 09/26/2017   Procedure: ESOPHAGOGASTRODUODENOSCOPY (EGD);  Surgeon: Golda Claudis PENNER, MD;  [facility];  Service: Endoscopy;  Laterality: N/A;   ESOPHAGOGASTRODUODENOSCOPY N/A 10/03/2023   Procedure: EGD (ESOPHAGOGASTRODUODENOSCOPY);  Surgeon: Shaaron Lamar HERO, MD;  [facility];  Service: Endoscopy;  Laterality: N/A;   ESOPHAGOGASTRODUODENOSCOPY (EGD) WITH PROPOFOL  N/A 07/21/2020   Procedure: ESOPHAGOGASTRODUODENOSCOPY (EGD) WITH PROPOFOL ;  Surgeon: Eartha Angelia Sieving, MD;  [facility];  Service: Gastroenterology;  Laterality: N/A;   ESOPHAGOGASTRODUODENOSCOPY (EGD) WITH PROPOFOL  N/A 03/26/2022   Procedure: ESOPHAGOGASTRODUODENOSCOPY (EGD) WITH PROPOFOL ;  Surgeon: Eartha Angelia Sieving, MD;  [facility];  Service: Gastroenterology;  Laterality: N/A;  1215 ASA 3   MALONEY DILATION N/A 03/25/2014   Procedure: MALONEY DILATION;  Surgeon: Claudis PENNER Golda, MD;  [facility];  Service: Endoscopy;  Laterality: N/A;   PACEMAKER IMPLANT N/A 08/27/2022   Procedure: PACEMAKER IMPLANT;  Surgeon: Nancey Eulas BRAVO, MD;  [facility];  Service: Cardiovascular;  Laterality: N/A;   POLYPECTOMY  12/13/2016   Procedure: POLYPECTOMY;  Surgeon: Golda Claudis PENNER, MD;  [facility];  Service: Endoscopy;;  polyp   POLYPECTOMY  07/21/2020   Procedure: POLYPECTOMY;  Surgeon: Eartha Angelia Sieving, MD;  [facility];  Service: Gastroenterology;;   TOTAL HIP  ARTHROPLASTY Left    TUBAL LIGATION     VEIN SURGERY Bilateral    stripped vein due to DVT    Family History: Family History  Problem Relation Age of Onset   Heart disease Father     Social History:Tobacco Use History[1] Social History   Substance and Sexual Activity  Alcohol Use No   Alcohol/week: 0.0 standard drinks of alcohol   Social History   Substance and Sexual Activity  Drug Use No    Allergies: Allergies[2]  Medications: Current Outpatient Medications  Medication Sig Dispense Refill   acetaminophen  (TYLENOL ) 500 MG tablet Take 1,000 mg by mouth every 6 (six) hours as needed (pain.). Gelcaps     albuterol  (VENTOLIN  HFA) 108 (90 Base) MCG/ACT inhaler Inhale 1-2 puffs into the lungs every 6 (six) hours as needed for shortness of breath or wheezing.     allopurinol  (ZYLOPRIM ) 100 MG tablet Take 100 mg  by mouth in the morning.     ALPRAZolam  (XANAX ) 0.5 MG tablet Take 0.5 mg by mouth 3 (three) times daily as needed for anxiety.     clobetasol ointment (TEMOVATE) 0.05 % Apply 1 Application topically 2 (two) times daily as needed (irritation.).     colchicine  0.6 MG tablet Take 0.6 mg by mouth 2 (two) times daily as needed.     dexlansoprazole (DEXILANT) 60 MG capsule Take 60 mg by mouth daily before breakfast.     diltiazem  (CARDIZEM ) 30 MG tablet Take 1 tablet (30 mg total) by mouth daily as needed. 30 tablet 5   famotidine (PEPCID) 40 MG/5ML suspension      fluticasone (FLONASE) 50 MCG/ACT nasal spray Place 2 sprays into both nostrils daily as needed (allergies (cough/sneezing)).     furosemide  (LASIX ) 20 MG tablet Take 20 mg by mouth in the morning.     insulin  aspart (NOVOLOG  FLEXPEN RELION) 100 UNIT/ML FlexPen Inject 4-10 Units into the skin 3 (three) times daily with meals. Per sliding scale >150 = 4 units 200-250 = 6 units 251-350 = 8 units >351 = 10 units     Insulin  Glargine (BASAGLAR  KWIKPEN) 100 UNIT/ML Inject 20 Units into the skin at bedtime.      latanoprost  (XALATAN ) 0.005 % ophthalmic solution Place 1 drop into both eyes at bedtime.     LUTEIN PO Take 40 mg by mouth daily.     meclizine  (ANTIVERT ) 25 MG tablet Take 1 tablet (25 mg total) by mouth 3 (three) times daily as needed for dizziness. 30 tablet 0   Menthol , Topical Analgesic, (ICY HOT BACK EXTRA STRENGTH) 5 % PTCH Place 1 patch onto the skin daily as needed (pain.).     nystatin  cream (MYCOSTATIN ) Apply 1 Application topically 2 (two) times daily.     ondansetron  (ZOFRAN ) 4 MG tablet Take 4 mg by mouth every 8 (eight) hours as needed for nausea or vomiting. Dissolving tablet     phenazopyridine (PYRIDIUM) 200 MG tablet Take 200 mg by mouth 3 (three) times daily as needed for pain.     polyethylene glycol (MIRALAX  / GLYCOLAX ) packet Take 34 g by mouth in the morning.     potassium chloride (MICRO-K) 10 MEQ CR capsule Take 10 mEq by mouth in the morning.     tacrolimus (PROTOPIC) 0.1 % ointment Apply 1 Application topically 2 (two) times daily.     traMADol  (ULTRAM ) 50 MG tablet Take 1 tablet (50 mg total) by mouth every 6 (six) hours as needed for severe pain. 10 tablet 0   triamcinolone ointment (KENALOG) 0.1 % Apply 1 Application topically daily.     No current facility-administered medications for this visit.    Review of Systems: GENERAL: negative for malaise, night sweats HEENT: No changes in hearing or vision, no nose bleeds or other nasal problems. NECK: Negative for lumps, goiter, pain and significant neck swelling RESPIRATORY: Negative for cough, wheezing CARDIOVASCULAR: Negative for chest pain, leg swelling, palpitations, orthopnea GI: SEE HPI MUSCULOSKELETAL: Negative for joint pain or swelling, back pain, and muscle pain. SKIN: Negative for lesions, rash HEMATOLOGY Negative for prolonged bleeding, bruising easily, and swollen nodes. ENDOCRINE: Negative for cold or heat intolerance, polyuria, polydipsia and goiter. NEURO: negative for tremor, gait imbalance,  syncope and seizures. The remainder of the review of systems is noncontributory.   Physical Exam: BP 131/79   Pulse 69   Temp 99 F (37.2 C)   Ht 5' 6 (1.676 m)  Wt 162 lb (73.5 kg)   BMI 26.15 kg/m  GENERAL: The patient is AO x3, in no acute distress. HEENT: Head is normocephalic and atraumatic. EOMI are intact. Mouth is well hydrated and without lesions. NECK: Supple. No masses LUNGS: Clear to auscultation. No presence of rhonchi/wheezing/rales. Adequate chest expansion HEART: RRR, normal s1 and s2. ABDOMEN: Soft, nontender, no guarding, no peritoneal signs, and nondistended. BS +. No masses.   Imaging/Labs: as above     Latest Ref Rng & Units 03/05/2024    1:36 PM 10/03/2023    4:16 AM 10/02/2023    3:34 PM  CBC  WBC 4.0 - 10.5 K/uL 7.3  7.1    Hemoglobin 12.0 - 15.0 g/dL 87.2  88.1  87.0   Hematocrit 36.0 - 46.0 % 37.3  35.1  38.0   Platelets 150 - 400 K/uL 192  161     No results found for: IRON, TIBC, FERRITIN  I personally reviewed and interpreted the available labs, imaging and endoscopic files.  Impression and Plan:  Traci Clark is a 89 y.o. female history of CKD, history of colon cancer status post resection, diabetes, IBS, GERD, seizures, glaucoma, anxiety, DVT, gout  Candida esophagitis who presents for evaluation of abdominal discomfort/IBS/GERD  #GERD #IBS/Flatulence  Patient has history of dysphagia secondary to esophageal web/Schatzki's ring and does have history of Candida esophagitis.  She has been taking PPI but incorrectly as she takes it after food.  Recommend taking 30 minutes before breakfast  Patient previously had multiple CT scans and most recently 08/2023 CT abdomen pelvis without any overt GI pathology.  She is up-to-date to her colonoscopy and upper endoscopy performed beginning of this year Patient most likely has IBS with ongoing constipation  Recommendation :  -Low FODMAP diet -Probiotics -Gas-X as needed for  gas -Miralax  daily -Dexilant , 30 min before breakfast   All questions were answered.      Traci Desaulniers Faizan Christpher Stogsdill, MD Gastroenterology and Hepatology [facility] Gastroenterology   This chart has been completed using [facility] Dictation software, and while attempts have been made to ensure accuracy , certain words and phrases may not be transcribed as intended      [1]  Social History Tobacco Use  Smoking Status Never   Passive exposure: Never  Smokeless Tobacco Never  [2]  Allergies Allergen Reactions   Amoxil [Amoxicillin] Anaphylaxis and Rash    Skin peeled off   Macrobid [Nitrofurantoin Monohyd Macro] Anaphylaxis, Rash and Other (See Comments)    Skin peeled off   Codeine Nausea And Vomiting and Other (See Comments)    Shaking    Darvon [Propoxyphene] Nausea And Vomiting and Other (See Comments)    Shaking    Phenergan  [Promethazine ] Other (See Comments)    Altered mental changes   Pravachol [Pravastatin] Other (See Comments)    Myalgias    Rocephin  [Ceftriaxone ] Other (See Comments)    Yeast infection   Sulfa Antibiotics Other (See Comments)    Thrush and itching   Hydrocodone-Acetaminophen  Nausea And Vomiting and Other (See Comments)    Causes chest to feel heavy     Mirtazapine Other (See Comments)    Does not tolerate   Neurontin  [Gabapentin ] Nausea Only    Feel faint

## 2024-05-10 NOTE — Patient Instructions (Addendum)
 It was very nice to meet you today, as dicussed with will plan for the following :  1) Low FODMAP diet  2) Probiotics  3) Gas-X as needed for gas  4) Miralax  daily  4) Dexilant , 30 min before breakfast

## 2024-05-26 ENCOUNTER — Ambulatory Visit: Payer: Self-pay

## 2024-05-26 DIAGNOSIS — I4430 Unspecified atrioventricular block: Secondary | ICD-10-CM | POA: Diagnosis not present

## 2024-05-27 LAB — CUP PACEART REMOTE DEVICE CHECK
Battery Remaining Longevity: 95 mo
Battery Remaining Percentage: 84 %
Battery Voltage: 2.99 V
Brady Statistic AP VP Percent: 56 %
Brady Statistic AP VS Percent: 1 %
Brady Statistic AS VP Percent: 44 %
Brady Statistic AS VS Percent: 1 %
Brady Statistic RA Percent Paced: 56 %
Brady Statistic RV Percent Paced: 99 %
Date Time Interrogation Session: 20260107040016
Implantable Lead Connection Status: 753985
Implantable Lead Connection Status: 753985
Implantable Lead Implant Date: 20240409
Implantable Lead Implant Date: 20240409
Implantable Lead Location: 753859
Implantable Lead Location: 753860
Implantable Pulse Generator Implant Date: 20240409
Lead Channel Impedance Value: 380 Ohm
Lead Channel Impedance Value: 440 Ohm
Lead Channel Pacing Threshold Amplitude: 0.75 V
Lead Channel Pacing Threshold Amplitude: 0.875 V
Lead Channel Pacing Threshold Pulse Width: 0.5 ms
Lead Channel Pacing Threshold Pulse Width: 0.5 ms
Lead Channel Sensing Intrinsic Amplitude: 12 mV
Lead Channel Sensing Intrinsic Amplitude: 3.8 mV
Lead Channel Setting Pacing Amplitude: 1.125
Lead Channel Setting Pacing Amplitude: 2 V
Lead Channel Setting Pacing Pulse Width: 0.5 ms
Lead Channel Setting Sensing Sensitivity: 2 mV
Pulse Gen Model: 2272
Pulse Gen Serial Number: 8169291

## 2024-05-29 ENCOUNTER — Ambulatory Visit: Payer: Self-pay | Admitting: Cardiovascular Disease

## 2024-05-31 NOTE — Progress Notes (Signed)
 Remote PPM Transmission
